# Patient Record
Sex: Male | Born: 1935 | Race: Black or African American | Hispanic: No | Smoking: Former smoker
Health system: Southern US, Community
[De-identification: ages and names within clinical notes are randomized; demographics above are authoritative.]

## PROBLEM LIST (undated history)

## (undated) DIAGNOSIS — I5020 Unspecified systolic (congestive) heart failure: Secondary | ICD-10-CM

## (undated) DIAGNOSIS — I480 Paroxysmal atrial fibrillation: Secondary | ICD-10-CM

## (undated) DIAGNOSIS — I10 Essential (primary) hypertension: Secondary | ICD-10-CM

## (undated) DIAGNOSIS — I214 Non-ST elevation (NSTEMI) myocardial infarction: Secondary | ICD-10-CM

## (undated) DIAGNOSIS — Z973 Presence of spectacles and contact lenses: Secondary | ICD-10-CM

## (undated) DIAGNOSIS — G4730 Sleep apnea, unspecified: Secondary | ICD-10-CM

## (undated) DIAGNOSIS — I429 Cardiomyopathy, unspecified: Secondary | ICD-10-CM

## (undated) DIAGNOSIS — I2510 Atherosclerotic heart disease of native coronary artery without angina pectoris: Secondary | ICD-10-CM

## (undated) DIAGNOSIS — M1990 Unspecified osteoarthritis, unspecified site: Secondary | ICD-10-CM

## (undated) DIAGNOSIS — K759 Inflammatory liver disease, unspecified: Secondary | ICD-10-CM

## (undated) DIAGNOSIS — I509 Heart failure, unspecified: Secondary | ICD-10-CM

## (undated) DIAGNOSIS — I255 Ischemic cardiomyopathy: Secondary | ICD-10-CM

## (undated) DIAGNOSIS — Z972 Presence of dental prosthetic device (complete) (partial): Secondary | ICD-10-CM

## (undated) DIAGNOSIS — I77810 Thoracic aortic ectasia: Secondary | ICD-10-CM

## (undated) DIAGNOSIS — C801 Malignant (primary) neoplasm, unspecified: Secondary | ICD-10-CM

## (undated) DIAGNOSIS — K254 Chronic or unspecified gastric ulcer with hemorrhage: Secondary | ICD-10-CM

## (undated) DIAGNOSIS — I639 Cerebral infarction, unspecified: Secondary | ICD-10-CM

## (undated) DIAGNOSIS — N182 Chronic kidney disease, stage 2 (mild): Secondary | ICD-10-CM

## (undated) HISTORY — PX: COLONOSCOPY: SHX174

## (undated) HISTORY — DX: Thoracic aortic ectasia: I77.810

## (undated) HISTORY — DX: Chronic kidney disease, stage 2 (mild): N18.2

## (undated) HISTORY — DX: Atherosclerotic heart disease of native coronary artery without angina pectoris: I25.10

---
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
## 1979-01-06 DIAGNOSIS — K759 Inflammatory liver disease, unspecified: Secondary | ICD-10-CM

## 1979-01-06 HISTORY — DX: Inflammatory liver disease, unspecified: K75.9

## 2000-01-06 DIAGNOSIS — C61 Malignant neoplasm of prostate: Secondary | ICD-10-CM

## 2000-01-06 HISTORY — PX: PROSTATECTOMY: SHX69

## 2000-01-06 HISTORY — DX: Malignant neoplasm of prostate: C61

## 2001-01-05 DIAGNOSIS — I639 Cerebral infarction, unspecified: Secondary | ICD-10-CM

## 2001-01-05 HISTORY — DX: Cerebral infarction, unspecified: I63.9

## 2001-12-05 HISTORY — PX: JOINT REPLACEMENT: SHX530

## 2003-01-06 DIAGNOSIS — G4730 Sleep apnea, unspecified: Secondary | ICD-10-CM

## 2003-01-06 HISTORY — DX: Sleep apnea, unspecified: G47.30

## 2007-01-06 HISTORY — PX: HERNIA REPAIR: SHX51

## 2007-02-05 ENCOUNTER — Encounter
Admission: RE | Admit: 2007-02-05 | Discharge: 2007-02-05 | Payer: Self-pay | Admitting: Physical Medicine and Rehabilitation

## 2007-02-15 ENCOUNTER — Encounter: Admission: RE | Admit: 2007-02-15 | Discharge: 2007-02-15 | Payer: Self-pay | Admitting: Internal Medicine

## 2007-02-28 ENCOUNTER — Encounter: Admission: RE | Admit: 2007-02-28 | Discharge: 2007-02-28 | Payer: Self-pay | Admitting: General Surgery

## 2007-03-03 ENCOUNTER — Ambulatory Visit (HOSPITAL_BASED_OUTPATIENT_CLINIC_OR_DEPARTMENT_OTHER): Admission: RE | Admit: 2007-03-03 | Discharge: 2007-03-03 | Payer: Self-pay | Admitting: General Surgery

## 2007-03-03 ENCOUNTER — Encounter (INDEPENDENT_AMBULATORY_CARE_PROVIDER_SITE_OTHER): Payer: Self-pay | Admitting: General Surgery

## 2010-05-20 NOTE — Op Note (Signed)
Brian Brown, Brian Brown              ACCOUNT NO.:  [id number]   MEDICAL RECORD NO.:  [id number]          PATIENT TYPE:  AMB   LOCATION:  [facility]                         FACILITY:  [facility]   PHYSICIAN:  Anselm Pancoast. Weatherly, M.D.DATE OF BIRTH:  02-01-1932   DATE OF PROCEDURE:  03/03/2007  DATE OF DISCHARGE:                               OPERATIVE REPORT   PREOPERATIVE DIAGNOSES:  Right inguinal hernia.   POSTOPERATIVE DIAGNOSES:  Right inguinal hernia, indirect.   OPERATIONS:  Right inguinal herniorrhaphy.   General anesthesia, LOA tube, with local supplementation.   [name] is a 77 year old thin male who was referred to me by Dr.  Nehemiah Settle with a symptomatic right inguinal hernia.  The patient has had a  previous open prostatectomy about 15 years ago, no evidence of any other  problem, and states that the hernia has been gradually increasing in  size and then recently he was cutting some fire wood and got a definite  bulge and pain in the right groin.  I thought this was probably a direct  inguinal hernia.  He had had problems with back problems and was  receiving physical therapy and injections for this under Dr. Haskell Riling  service and I recommended that he try to get his back better and then we  would proceed with the right inguinal hernia.  He has had no evidence of  any further problems with his prostate or difficulty voiding, and he is  now had no bowel irregularity.  Laboratory studies were unremarkable and  preoperatively he had a good bowel movement last evening.   The patient was taken to the operative suite.  He was given a gram of  Ancef and then positioned on the OR table, LOA tube by Dr. Shireen Quan, and  then the right groin area was first clipped and then prepped with  Betadine solution.  We had marked the incision, as had the patient, and  time-out was completed according to protocol and then draped in a  sterile manner.  The incision was made, sharp dissection down through  the skin, subcutaneous tissue and Scarpa fascia.  There were three veins  that were identified, clamped, divided and ligated with fine Vicryl.  The external oblique aponeurosis had been kind of attenuated at the  midportion, probably from his lower midline incision, and we were  dissecting, freeing up the area, and then opened the external oblique,  exposing the cord structures.  I was surprised to see that this is not a  direct hernia but an indirect hernia with a fairly large hernia sac, and  I elevated the cord structures.  I think I identified the ilioinguinal  nerve that was protected with the cord structure and then, working  medially, separated the hernia sac from the cord structures.  There was  a fairly medium-sized lipoma of the port that I first removed.  The  little pedicles were tied with 4-0 Vicryl and then the hernia sac that  was probably 4-5 inches in length, was opened and a high sac ligation  was done right at the ring area with 0 Surgilon  and a second suture was  placed just distal.  There was no bowel and the hernia sac at this time  and the area where the cord high sac ligation had been performed was  anesthetized also with the anesthetic, 0.5% Marcaine and 0.25  __________.  I had blocked the ilioinguinal nerve area with a blunted 20-  gauge needle before the incision.  Next I reinforced the floor, where it  was weakened but not really a true direct hernia, with a 2-0 Prolene  starting at the symphysis pubis, going back, recreating the internal  ring, and then going back tying the two edges together, suturing the  shelving edge of the inguinal ligament to the conjoined tendon area.  Next a piece of Prolene mesh shaped like a sail, slit laterally, was  used to reinforce the floor.  The inferior limb was sutured with a  running 2-0 Prolene.  The two tails go around the internal ring and the  ilioinguinal nerve was with these structures.  The two tails were  sutured  together laterally and it is lying flat and not under excessive  tension.  I then sutured the superior flap with interrupted sutures of 2-  0 Prolene and it is lying comfortable.  Next the external oblique was  sutured with a 3-0 Vicryl and then Scarpa fascia  was sutured with interrupted 3-0 Vicryl, 4-0 Dexon subcuticular and  Benzoin and Steri-Strips on the skin.  All total, we had used about 30  mL of the mixture of anesthetics.  He says he has not had any problems  with voiding since his prostatectomy.           ______________________________  Anselm Pancoast. Zachery Dakins, M.D.     WJW/MEDQ  D:  03/03/2007  T:  03/04/2007  Job:  [id number]   cc:   Deirdre Peer. Polite, M.D.

## 2010-09-26 LAB — DIFFERENTIAL
Basophils Absolute: 0
Basophils Relative: 1
Eosinophils Absolute: 0.3
Eosinophils Relative: 3
Lymphocytes Relative: 36
Lymphs Abs: 2.9
Monocytes Absolute: 0.6
Monocytes Relative: 8
Neutro Abs: 4.3
Neutrophils Relative %: 53

## 2010-09-26 LAB — COMPREHENSIVE METABOLIC PANEL
ALT: 14
AST: 17
Albumin: 3.5
Alkaline Phosphatase: 73
BUN: 12
CO2: 31
Calcium: 9.2
Chloride: 102
Creatinine, Ser: 1
GFR calc Af Amer: >60
GFR calc non Af Amer: >60
Glucose, Bld: 80
Potassium: 4.4
Sodium: 139
Total Bilirubin: 0.7
Total Protein: 6.6

## 2010-09-26 LAB — CBC
HCT: 40.9
Hemoglobin: 13.6
MCHC: 33.3
MCV: 86.6
Platelets: 255
RBC: 4.72
RDW: 14.6
WBC: 8.1

## 2011-01-07 DIAGNOSIS — M1610 Unilateral primary osteoarthritis, unspecified hip: Secondary | ICD-10-CM | POA: Diagnosis not present

## 2011-01-13 ENCOUNTER — Other Ambulatory Visit (HOSPITAL_COMMUNITY): Payer: Self-pay

## 2011-01-20 ENCOUNTER — Encounter (HOSPITAL_COMMUNITY): Payer: Self-pay | Admitting: Pharmacy Technician

## 2011-01-27 ENCOUNTER — Other Ambulatory Visit: Payer: Self-pay

## 2011-01-27 ENCOUNTER — Ambulatory Visit (HOSPITAL_COMMUNITY)
Admission: RE | Admit: 2011-01-27 | Discharge: 2011-01-27 | Disposition: A | Discharge status: Home / Self Care | Payer: Medicare Other | Source: Ambulatory Visit | Attending: Orthopedic Surgery | Admitting: Orthopedic Surgery

## 2011-01-27 ENCOUNTER — Encounter (HOSPITAL_COMMUNITY)
Admission: RE | Admit: 2011-01-27 | Discharge: 2011-01-27 | Disposition: A | Discharge status: Home / Self Care | Payer: Medicare Other | Source: Ambulatory Visit | Attending: Orthopaedic Surgery | Admitting: Orthopaedic Surgery

## 2011-01-27 ENCOUNTER — Encounter (HOSPITAL_COMMUNITY): Payer: Self-pay

## 2011-01-27 DIAGNOSIS — Z01812 Encounter for preprocedural laboratory examination: Secondary | ICD-10-CM | POA: Insufficient documentation

## 2011-01-27 DIAGNOSIS — Z01818 Encounter for other preprocedural examination: Secondary | ICD-10-CM | POA: Insufficient documentation

## 2011-01-27 DIAGNOSIS — G4730 Sleep apnea, unspecified: Secondary | ICD-10-CM | POA: Diagnosis present

## 2011-01-27 DIAGNOSIS — I10 Essential (primary) hypertension: Secondary | ICD-10-CM | POA: Diagnosis present

## 2011-01-27 DIAGNOSIS — I639 Cerebral infarction, unspecified: Secondary | ICD-10-CM | POA: Diagnosis not present

## 2011-01-27 DIAGNOSIS — M169 Osteoarthritis of hip, unspecified: Secondary | ICD-10-CM | POA: Diagnosis present

## 2011-01-27 DIAGNOSIS — C61 Malignant neoplasm of prostate: Secondary | ICD-10-CM | POA: Diagnosis present

## 2011-01-27 DIAGNOSIS — Z01811 Encounter for preprocedural respiratory examination: Secondary | ICD-10-CM | POA: Diagnosis not present

## 2011-01-27 HISTORY — DX: Sleep apnea, unspecified: G47.30

## 2011-01-27 HISTORY — DX: Chronic or unspecified gastric ulcer with hemorrhage: K25.4

## 2011-01-27 HISTORY — DX: Essential (primary) hypertension: I10

## 2011-01-27 HISTORY — DX: Cerebral infarction, unspecified: I63.9

## 2011-01-27 HISTORY — DX: Malignant (primary) neoplasm, unspecified: C80.1

## 2011-01-27 HISTORY — DX: Unspecified osteoarthritis, unspecified site: M19.90

## 2011-01-27 HISTORY — DX: Inflammatory liver disease, unspecified: K75.9

## 2011-01-27 LAB — URINE CULTURE
Colony Count: NO GROWTH
Culture  Setup Time: 201301221407
Culture: NO GROWTH

## 2011-01-27 LAB — DIFFERENTIAL
Basophils Absolute: 0.1 10*3/uL (ref 0.0–0.1)
Basophils Relative: 1 % (ref 0–1)
Eosinophils Absolute: 0.3 10*3/uL (ref 0.0–0.7)
Eosinophils Relative: 4 % (ref 0–5)
Lymphocytes Relative: 36 % (ref 12–46)
Lymphs Abs: 2.4 10*3/uL (ref 0.7–4.0)
Monocytes Absolute: 0.7 10*3/uL (ref 0.1–1.0)
Monocytes Relative: 10 % (ref 3–12)
Neutro Abs: 3.2 10*3/uL (ref 1.7–7.7)
Neutrophils Relative %: 49 % (ref 43–77)

## 2011-01-27 LAB — PROTIME-INR
INR: 1.02 (ref 0.00–1.49)
Prothrombin Time: 13.6 seconds (ref 11.6–15.2)

## 2011-01-27 LAB — COMPREHENSIVE METABOLIC PANEL
ALT: 16 U/L (ref 0–53)
AST: 19 U/L (ref 0–37)
Albumin: 3.6 g/dL (ref 3.5–5.2)
Alkaline Phosphatase: 90 U/L (ref 39–117)
BUN: 15 mg/dL (ref 6–23)
CO2: 32 mEq/L (ref 19–32)
Calcium: 9.5 mg/dL (ref 8.4–10.5)
Chloride: 99 mEq/L (ref 96–112)
Creatinine, Ser: 0.84 mg/dL (ref 0.50–1.35)
GFR calc Af Amer: >90 mL/min (ref >90)
GFR calc non Af Amer: 83 mL/min — ABNORMAL LOW (ref >90)
Glucose, Bld: 76 mg/dL (ref 70–99)
Potassium: 3.3 mEq/L — ABNORMAL LOW (ref 3.5–5.1)
Sodium: 138 mEq/L (ref 135–145)
Total Bilirubin: 0.4 mg/dL (ref 0.3–1.2)
Total Protein: 7.6 g/dL (ref 6.0–8.3)

## 2011-01-27 LAB — URINALYSIS, ROUTINE W REFLEX MICROSCOPIC
Bilirubin Urine: NEGATIVE
Glucose, UA: NEGATIVE mg/dL
Ketones, ur: NEGATIVE mg/dL
Leukocytes, UA: NEGATIVE
Nitrite: NEGATIVE
Protein, ur: NEGATIVE mg/dL
Specific Gravity, Urine: 1.019 (ref 1.005–1.030)
Urobilinogen, UA: 0.2 mg/dL (ref 0.0–1.0)
pH: 6 (ref 5.0–8.0)

## 2011-01-27 LAB — HEPATITIS C ANTIBODY: HCV Ab: REACTIVE — AB

## 2011-01-27 LAB — CBC
HCT: 45 % (ref 39.0–52.0)
Hemoglobin: 15.5 g/dL (ref 13.0–17.0)
MCH: 28.9 pg (ref 26.0–34.0)
MCHC: 34.4 g/dL (ref 30.0–36.0)
MCV: 84 fL (ref 78.0–100.0)
Platelets: 224 10*3/uL (ref 150–400)
RBC: 5.36 MIL/uL (ref 4.22–5.81)
RDW: 13 % (ref 11.5–15.5)
WBC: 6.5 10*3/uL (ref 4.0–10.5)

## 2011-01-27 LAB — TYPE AND SCREEN
ABO/RH(D): A POS
Antibody Screen: NEGATIVE

## 2011-01-27 LAB — SURGICAL PCR SCREEN
MRSA, PCR: NEGATIVE
Staphylococcus aureus: NEGATIVE

## 2011-01-27 LAB — URINE MICROSCOPIC-ADD ON

## 2011-01-27 LAB — APTT: aPTT: 24 seconds (ref 24–37)

## 2011-01-27 LAB — ABO/RH: ABO/RH(D): A POS

## 2011-01-27 LAB — HEPATITIS B SURFACE ANTIGEN: Hepatitis B Surface Ag: NEGATIVE

## 2011-01-27 NOTE — Pre-Procedure Instructions (Signed)
20 Brian Brown  01/27/2011   Your procedure is scheduled on:  Tues, Jan 29  Report to [facility] at 8:30 AM.  Call this number if you have problems the morning of surgery: [phone]   Remember:   Do not eat food:After Midnight.  May have clear liquids: up to 4 Hours before arrival.  Clear liquids include soda, tea, black coffee, apple or grape juice, broth.  Take these medicines the morning of surgery with A SIP OF WATER: *Diltiazem,Metoprolol, Hydrocodone**   Do not wear jewelry, make-up or nail polish.  Do not wear lotions, powders, or perfumes. You may wear deodorant.  Do not shave 48 hours prior to surgery.  Do not bring valuables to the hospital.  Contacts, dentures or bridgework may not be worn into surgery.  Leave suitcase in the car. After surgery it may be brought to your room.  For patients admitted to the hospital, checkout time is 11:00 AM the day of discharge.   Patients discharged the day of surgery will not be allowed to drive home.  Name and phone number of your driver: *n/a**  Special Instructions: Incentive Spirometry - Practice and bring it with you on the day of surgery. and CHG Shower Use Special Wash: 1/2 bottle night before surgery and 1/2 bottle morning of surgery.   Please read over the following fact sheets that you were given: Pain Booklet, Coughing and Deep Breathing, Blood Transfusion Information, Total Joint Packet, MRSA Information and Surgical Site Infection Prevention

## 2011-01-27 NOTE — H&P (Signed)
Brian Brown [id number] DOB: 03-13-1932 Sex: male  COMPLAINT:     Painful left hip.  HPI:     Brian Brown is a very pleasant 78 year old African-American male who is being seen in our office for evaluation of his left hip.  He has had at least a three month history of gradual onset of pain and discomfort whenever he tries to ambulate.  He states he does not have much pain when he is sitting, but certainly does have pain with active ambulation.  He states his pain is moderately severe and is more of an aching, throbbing pain with occasional stabbing pain.  It is getting worse.  It does wake him at nighttime.  Activities of daily living have become quite bothersome to him.  At this time, nothing is really helping his pain except for hydrocodone.    MEDICATIONS:     Hydrocodone 5/500 mg 1 q.12 h. p.r.n. pain, metoprolol 25 mg 2 tablets daily, diltiazem 240 mg once daily, Plavix 75 mg daily, and simvastatin 40 mg daily.  ALLERGIES:     None known.  PAST MEDICAL HISTORY:    Remarkable for CVA in 2003.  PAST SURGICAL HISTORY:     Prostatectomy via abdominal approach in 2002 for cancer, right total hip replacement in 2003, and right inguinal hernia in 2009.  HOSPITALIZATIONS:     Bell's palsy and hepatitis in 1981.    ROS:     Fourteen point review of systems is positive for glasses and upper dentures.  He also had CVA in 2003 and has been placed on Plavix for that reason.  He has had hypertension for 32 years and is on medications for that.  He states he does have hepatitis, but he is not quite sure what type it is.  He has had prostate cancer and has undergone prostatectomy.  He does occasionally urinate two to three times per night.  He has had from the stroke some right leg weakness.  He also states that he has sleep apnea.  He does not use CPAP, but it has been suggested.  All others denied.  FAMILY HISTORY:     Mother deceased age 7 from stroke with history of hypertension and high blood  pressure.  Father deceased age 28 from myocardial infarction with history of heart disease, hypertension, and strokes.    Continued  SOCIAL HISTORY:    The patient is a very pleasant 78 year old African-American male.  He is widowed.  He is retired from Programme researcher, broadcasting/film/video.  He quit smoking cigarettes about three years ago.  Prior to that, he was smoking 1 PPD for many years.  Total of 15 years in duration, though, when accumulated.  He does drink on special occasions only.  He does not use snuff or oral tobacco.  EXAM:     Examination today reveals a very pleasant 78 year old African-American male who is well developed, well nourished, thin, alert, and cooperative.  He is in moderate distress secondary to left hip pain.    VITAL SIGNS:Temperature 97.4 degrees.  Pulse 54.  Respiration rate 16.  Blood pressure 142/81.    Height 5 feet 9 inches.  Weight 195 pounds. HEENT:   Head normocephalic.  Eyes PERRLA.  EOMs intact.  Neck supple, no bruits are noted.   Chest:   Good expansion.  Lungs reveal good breath sounds bilaterally without rales or rhonchi. Heart:   Regular rate and rhythm; that of a bradycardia.  No discrete murmurs, rubs, or gallops appreciated.  Pulses are 1+ bilateral and symmetric in the lower extremities.  CNS: Oriented x 3.  Cranial nerves 2 through 12 grossly intact. Abdomen:   Scaphoid soft, non tender, no masses palpable.  Normal bowel sounds present.   Genitalia:    Not indicated for orthopaedic evaluation. Rectal: Not indicated for orthopaedic evaluation. Breast: Not indicated for orthopaedic evaluation. Musculoskeletal:  Today the left hip barely has any internal or external rotation greater than 10 degrees to 20 degrees.  He does have a positive log roll.  Abduction is to about 20 degrees to 30 degrees.  I can get him to about 90 degrees of flexion, but pain at the extremes.  X-RAYS:     X-rays on the [facility] system reveal bone-on-bone osteoarthritis of the left hip.  I have received  information from Dr. Nehemiah Settle who feels that he is cleared from a medical and cardiac standpoint.  He is on Plavix and I have contacted Dr. Idelle Crouch office personally.  He states that he may stop the Plavix five days prior to surgery and then restart afterwards.  Hopefully he will be able to use Xarelto for 30 days and then bring him back onto Plavix.  IMPRESSION:      1. End-stage osteoarthritis, left hip. 2. History of prostate cancer. 3. History of Bell's palsy.  Continued 4. History of hepatitis, type unknown. 5. Prostate cancer. 6. Sleep apnea. 7. Hypertension.  RECOMMENDATIONS:      1. At this time, our plan is to schedule and proceed with a left total hip arthroplasty.  The procedure risks and benefits have been fully explained to the patient in detail using models today.  I have answered all of his questions.  His plan is to go home.   Brian Drone Petrarca, PA-C  01/27/2011   1:44 PM

## 2011-01-28 LAB — HEPATITIS A ANTIBODY, TOTAL: Hep A Total Ab: NEGATIVE

## 2011-01-29 LAB — HEPATITIS B E ANTIBODY: Hep B E Ab: POSITIVE

## 2011-01-29 NOTE — Consult Note (Addendum)
Anesthesia:  Patient is a 78 year old male scheduled for a left THR on 02/03/11.  His history includes CVA, OSA, HTN, GIB, prostate CA, former smoker.  He reports that he was turned down to donate blood in the past due to a positive screening for "hepatitis".  He has a history of a blood transfusion.  EKG shows SB at 56 bpm.  CXR shows no acute cardiopulmonary process.  Labs noted.  Hepatitis screening labs were ordered by the surgeon that showed a negative Hep A Total Ab and Hepatitis B Surface Ag, but a reactive HCV Ab.  His Hep E Ab is also positive.  I've called these results to Dr. Hoy Register PA, Jacqualine Code.  He is going to contact the patient's PCP Dr. Nehemiah Settle regarding further recommendations.  I have already requested Dr. Idelle Crouch last office note, but that is still pending.  Patient's LFTs, PLT count, and PT/PTT were WNL.  I reviewed above with Anesthesiologist Dr. Gypsy Balsam.  Okay to proceed from an Anesthesia standpoint.

## 2011-01-29 NOTE — Progress Notes (Signed)
Hep. A and Hep. B lab results in epic. Chart left out for Violet Baldy to review.

## 2011-02-02 MED ORDER — SODIUM CHLORIDE 0.9 % IV SOLN: INTRAVENOUS | Status: DC

## 2011-02-02 MED ORDER — CEFAZOLIN SODIUM-DEXTROSE 2-3 GM-% IV SOLR
2.0000 g | INTRAVENOUS | Status: AC
  Administered 2011-02-03: 2 g via INTRAVENOUS
  Filled 2011-02-02 (×2): qty 50

## 2011-02-03 ENCOUNTER — Ambulatory Visit (HOSPITAL_COMMUNITY): Payer: Medicare Other | Admitting: Vascular Surgery

## 2011-02-03 ENCOUNTER — Encounter (HOSPITAL_COMMUNITY): Payer: Self-pay | Admitting: Vascular Surgery

## 2011-02-03 ENCOUNTER — Inpatient Hospital Stay (HOSPITAL_COMMUNITY): Payer: Medicare Other

## 2011-02-03 ENCOUNTER — Encounter (HOSPITAL_COMMUNITY)
Admission: RE | Disposition: A | Discharge status: Home Health | Payer: Self-pay | Source: Ambulatory Visit | Attending: Orthopaedic Surgery

## 2011-02-03 ENCOUNTER — Encounter (HOSPITAL_COMMUNITY): Payer: Self-pay | Admitting: *Deleted

## 2011-02-03 ENCOUNTER — Inpatient Hospital Stay (HOSPITAL_COMMUNITY)
Admission: RE | Admit: 2011-02-03 | Discharge: 2011-02-06 | DRG: 470 | Disposition: A | Discharge status: Home Health | Payer: Medicare Other | Source: Ambulatory Visit | Attending: Orthopaedic Surgery | Admitting: Orthopaedic Surgery

## 2011-02-03 DIAGNOSIS — C61 Malignant neoplasm of prostate: Secondary | ICD-10-CM | POA: Diagnosis present

## 2011-02-03 DIAGNOSIS — IMO0002 Reserved for concepts with insufficient information to code with codable children: Secondary | ICD-10-CM | POA: Diagnosis not present

## 2011-02-03 DIAGNOSIS — M169 Osteoarthritis of hip, unspecified: Secondary | ICD-10-CM

## 2011-02-03 DIAGNOSIS — G4730 Sleep apnea, unspecified: Secondary | ICD-10-CM | POA: Diagnosis present

## 2011-02-03 DIAGNOSIS — B182 Chronic viral hepatitis C: Secondary | ICD-10-CM | POA: Diagnosis present

## 2011-02-03 DIAGNOSIS — D62 Acute posthemorrhagic anemia: Secondary | ICD-10-CM | POA: Diagnosis not present

## 2011-02-03 DIAGNOSIS — Z96649 Presence of unspecified artificial hip joint: Secondary | ICD-10-CM

## 2011-02-03 DIAGNOSIS — M1610 Unilateral primary osteoarthritis, unspecified hip: Secondary | ICD-10-CM | POA: Diagnosis not present

## 2011-02-03 DIAGNOSIS — E876 Hypokalemia: Secondary | ICD-10-CM | POA: Diagnosis not present

## 2011-02-03 DIAGNOSIS — Z8546 Personal history of malignant neoplasm of prostate: Secondary | ICD-10-CM | POA: Diagnosis not present

## 2011-02-03 DIAGNOSIS — I639 Cerebral infarction, unspecified: Secondary | ICD-10-CM | POA: Diagnosis not present

## 2011-02-03 DIAGNOSIS — Z8673 Personal history of transient ischemic attack (TIA), and cerebral infarction without residual deficits: Secondary | ICD-10-CM | POA: Diagnosis not present

## 2011-02-03 DIAGNOSIS — I10 Essential (primary) hypertension: Secondary | ICD-10-CM | POA: Diagnosis present

## 2011-02-03 DIAGNOSIS — Z471 Aftercare following joint replacement surgery: Secondary | ICD-10-CM | POA: Diagnosis not present

## 2011-02-03 DIAGNOSIS — G4710 Hypersomnia, unspecified: Secondary | ICD-10-CM | POA: Diagnosis not present

## 2011-02-03 DIAGNOSIS — E871 Hypo-osmolality and hyponatremia: Secondary | ICD-10-CM | POA: Diagnosis not present

## 2011-02-03 DIAGNOSIS — Z7902 Long term (current) use of antithrombotics/antiplatelets: Secondary | ICD-10-CM | POA: Diagnosis not present

## 2011-02-03 DIAGNOSIS — N9989 Other postprocedural complications and disorders of genitourinary system: Secondary | ICD-10-CM | POA: Diagnosis not present

## 2011-02-03 DIAGNOSIS — Z87891 Personal history of nicotine dependence: Secondary | ICD-10-CM

## 2011-02-03 DIAGNOSIS — R339 Retention of urine, unspecified: Secondary | ICD-10-CM | POA: Diagnosis not present

## 2011-02-03 DIAGNOSIS — K759 Inflammatory liver disease, unspecified: Secondary | ICD-10-CM | POA: Diagnosis not present

## 2011-02-03 DIAGNOSIS — M25559 Pain in unspecified hip: Secondary | ICD-10-CM | POA: Diagnosis not present

## 2011-02-03 DIAGNOSIS — R768 Other specified abnormal immunological findings in serum: Secondary | ICD-10-CM | POA: Diagnosis present

## 2011-02-03 HISTORY — PX: TOTAL HIP ARTHROPLASTY: SHX124

## 2011-02-03 LAB — BASIC METABOLIC PANEL
BUN: 17 mg/dL (ref 6–23)
CO2: 29 mEq/L (ref 19–32)
Calcium: 9.2 mg/dL (ref 8.4–10.5)
Chloride: 103 mEq/L (ref 96–112)
Creatinine, Ser: 0.92 mg/dL (ref 0.50–1.35)
GFR calc Af Amer: >90 mL/min (ref >90)
GFR calc non Af Amer: 80 mL/min — ABNORMAL LOW (ref >90)
Glucose, Bld: 87 mg/dL (ref 70–99)
Potassium: 3.7 mEq/L (ref 3.5–5.1)
Sodium: 140 mEq/L (ref 135–145)

## 2011-02-03 LAB — PSA: PSA: <0.01 ng/mL — ABNORMAL LOW (ref <=4.00)

## 2011-02-03 SURGERY — ARTHROPLASTY, HIP, TOTAL,POSTERIOR APPROACH
Anesthesia: General | Site: Hip | Laterality: Left | Wound class: Clean

## 2011-02-03 MED ORDER — ONDANSETRON HCL 4 MG/2ML IJ SOLN
INTRAMUSCULAR | Status: DC | PRN
  Administered 2011-02-03: 4 mg via INTRAVENOUS

## 2011-02-03 MED ORDER — METOPROLOL TARTRATE 25 MG PO TABS
25.0000 mg | ORAL_TABLET | Freq: Two times a day (BID) | ORAL | Status: DC
  Administered 2011-02-03 – 2011-02-06 (×5): 25 mg via ORAL
  Filled 2011-02-03 (×7): qty 1

## 2011-02-03 MED ORDER — BUPIVACAINE-EPINEPHRINE 0.5% -1:200000 IJ SOLN
INTRAMUSCULAR | Status: DC | PRN
  Administered 2011-02-03: 20 mL

## 2011-02-03 MED ORDER — ACETAMINOPHEN 10 MG/ML IV SOLN
1000.0000 mg | Freq: Four times a day (QID) | INTRAVENOUS | Status: AC
  Administered 2011-02-03 – 2011-02-04 (×4): 1000 mg via INTRAVENOUS
  Filled 2011-02-03 (×5): qty 100

## 2011-02-03 MED ORDER — DIPHENHYDRAMINE HCL 12.5 MG/5ML PO ELIX
12.5000 mg | ORAL_SOLUTION | Freq: Four times a day (QID) | ORAL | Status: DC | PRN
  Filled 2011-02-03: qty 5

## 2011-02-03 MED ORDER — SODIUM CHLORIDE 0.9 % IV SOLN
INTRAVENOUS | Status: DC
  Administered 2011-02-04: via INTRAVENOUS

## 2011-02-03 MED ORDER — CHLORHEXIDINE GLUCONATE 4 % EX LIQD: 60.0000 mL | Freq: Once | CUTANEOUS | Status: DC

## 2011-02-03 MED ORDER — ACETAMINOPHEN 10 MG/ML IV SOLN
INTRAVENOUS | Status: DC | PRN
  Administered 2011-02-03: 1000 mg via INTRAVENOUS

## 2011-02-03 MED ORDER — SODIUM CHLORIDE 0.9 % IJ SOLN: 9.0000 mL | INTRAMUSCULAR | Status: DC | PRN

## 2011-02-03 MED ORDER — KETOROLAC TROMETHAMINE 15 MG/ML IJ SOLN
15.0000 mg | Freq: Four times a day (QID) | INTRAMUSCULAR | Status: AC
  Administered 2011-02-03 – 2011-02-04 (×2): 15 mg via INTRAVENOUS
  Filled 2011-02-03 (×2): qty 1

## 2011-02-03 MED ORDER — HYDROMORPHONE HCL PF 1 MG/ML IJ SOLN
0.2500 mg | INTRAMUSCULAR | Status: DC | PRN
  Administered 2011-02-03: 0.25 mg via INTRAVENOUS
  Administered 2011-02-03: 0.5 mg via INTRAVENOUS
  Administered 2011-02-03: 0.25 mg via INTRAVENOUS

## 2011-02-03 MED ORDER — METHOCARBAMOL 100 MG/ML IJ SOLN
500.0000 mg | Freq: Four times a day (QID) | INTRAVENOUS | Status: DC | PRN
  Filled 2011-02-03: qty 5

## 2011-02-03 MED ORDER — DOCUSATE SODIUM 100 MG PO CAPS
100.0000 mg | ORAL_CAPSULE | Freq: Two times a day (BID) | ORAL | Status: DC
  Administered 2011-02-03 – 2011-02-06 (×7): 100 mg via ORAL
  Filled 2011-02-03 (×8): qty 1

## 2011-02-03 MED ORDER — DILTIAZEM HCL ER 240 MG PO CP24
240.0000 mg | ORAL_CAPSULE | Freq: Every day | ORAL | Status: DC
  Administered 2011-02-04 – 2011-02-06 (×3): 240 mg via ORAL
  Filled 2011-02-03 (×3): qty 1

## 2011-02-03 MED ORDER — CEFAZOLIN SODIUM-DEXTROSE 2-3 GM-% IV SOLR
2.0000 g | Freq: Four times a day (QID) | INTRAVENOUS | Status: AC
  Administered 2011-02-03: 2 g via INTRAVENOUS
  Filled 2011-02-03 (×2): qty 50

## 2011-02-03 MED ORDER — HYDROMORPHONE 0.3 MG/ML IV SOLN
INTRAVENOUS | Status: DC
  Administered 2011-02-03: 13:00:00 via INTRAVENOUS

## 2011-02-03 MED ORDER — LACTATED RINGERS IV SOLN
INTRAVENOUS | Status: DC | PRN
  Administered 2011-02-03 (×2): via INTRAVENOUS

## 2011-02-03 MED ORDER — NEOSTIGMINE METHYLSULFATE 1 MG/ML IJ SOLN
INTRAMUSCULAR | Status: DC | PRN
  Administered 2011-02-03: 2 mg via INTRAVENOUS

## 2011-02-03 MED ORDER — CEFAZOLIN SODIUM-DEXTROSE 2-3 GM-% IV SOLR
2.0000 g | Freq: Four times a day (QID) | INTRAVENOUS | Status: DC
  Administered 2011-02-03: 2 g via INTRAVENOUS
  Filled 2011-02-03 (×3): qty 50

## 2011-02-03 MED ORDER — BISACODYL 10 MG RE SUPP
10.0000 mg | Freq: Every day | RECTAL | Status: DC | PRN
  Filled 2011-02-03: qty 1

## 2011-02-03 MED ORDER — ONDANSETRON HCL 4 MG/2ML IJ SOLN: 4.0000 mg | Freq: Four times a day (QID) | INTRAMUSCULAR | Status: DC | PRN

## 2011-02-03 MED ORDER — EPHEDRINE SULFATE 50 MG/ML IJ SOLN
INTRAMUSCULAR | Status: DC | PRN
  Administered 2011-02-03 (×3): 10 mg via INTRAVENOUS
  Administered 2011-02-03: 5 mg via INTRAVENOUS

## 2011-02-03 MED ORDER — GLYCOPYRROLATE 0.2 MG/ML IJ SOLN
INTRAMUSCULAR | Status: DC | PRN
  Administered 2011-02-03: .3 mg via INTRAVENOUS

## 2011-02-03 MED ORDER — FENTANYL CITRATE 0.05 MG/ML IJ SOLN
INTRAMUSCULAR | Status: DC | PRN
  Administered 2011-02-03 (×2): 50 ug via INTRAVENOUS
  Administered 2011-02-03: 25 ug via INTRAVENOUS
  Administered 2011-02-03: 200 ug via INTRAVENOUS
  Administered 2011-02-03: 25 ug via INTRAVENOUS

## 2011-02-03 MED ORDER — HYDROMORPHONE 0.3 MG/ML IV SOLN
INTRAVENOUS | Status: DC
  Filled 2011-02-03: qty 25

## 2011-02-03 MED ORDER — MENTHOL 3 MG MT LOZG: 1.0000 | LOZENGE | OROMUCOSAL | Status: DC | PRN

## 2011-02-03 MED ORDER — RIVAROXABAN 10 MG PO TABS
10.0000 mg | ORAL_TABLET | ORAL | Status: DC
  Filled 2011-02-03: qty 1

## 2011-02-03 MED ORDER — PHENOL 1.4 % MT LIQD
1.0000 | OROMUCOSAL | Status: DC | PRN
  Filled 2011-02-03: qty 177

## 2011-02-03 MED ORDER — PHENYLEPHRINE HCL 10 MG/ML IJ SOLN
INTRAMUSCULAR | Status: DC | PRN
  Administered 2011-02-03: 80 ug via INTRAVENOUS

## 2011-02-03 MED ORDER — DIPHENHYDRAMINE HCL 50 MG/ML IJ SOLN: 12.5000 mg | Freq: Four times a day (QID) | INTRAMUSCULAR | Status: DC | PRN

## 2011-02-03 MED ORDER — ACETAMINOPHEN 10 MG/ML IV SOLN
INTRAVENOUS | Status: AC
  Filled 2011-02-03: qty 100

## 2011-02-03 MED ORDER — ACETAMINOPHEN 10 MG/ML IV SOLN: 1000.0000 mg | Freq: Once | INTRAVENOUS | Status: DC

## 2011-02-03 MED ORDER — METOCLOPRAMIDE HCL 5 MG/ML IJ SOLN
5.0000 mg | Freq: Three times a day (TID) | INTRAMUSCULAR | Status: DC | PRN
  Filled 2011-02-03: qty 2

## 2011-02-03 MED ORDER — NALOXONE HCL 0.4 MG/ML IJ SOLN: 0.4000 mg | INTRAMUSCULAR | Status: DC | PRN

## 2011-02-03 MED ORDER — DILTIAZEM HCL ER 240 MG PO CP24
240.0000 mg | ORAL_CAPSULE | Freq: Every day | ORAL | Status: DC
  Filled 2011-02-03: qty 1

## 2011-02-03 MED ORDER — METHOCARBAMOL 500 MG PO TABS
500.0000 mg | ORAL_TABLET | Freq: Four times a day (QID) | ORAL | Status: DC | PRN
  Administered 2011-02-03 – 2011-02-06 (×7): 500 mg via ORAL
  Filled 2011-02-03 (×7): qty 1

## 2011-02-03 MED ORDER — VECURONIUM BROMIDE 10 MG IV SOLR
INTRAVENOUS | Status: DC | PRN
  Administered 2011-02-03: 6 mg via INTRAVENOUS

## 2011-02-03 MED ORDER — PROPOFOL 10 MG/ML IV EMUL
INTRAVENOUS | Status: DC | PRN
  Administered 2011-02-03: 200 mg via INTRAVENOUS

## 2011-02-03 MED ORDER — METOCLOPRAMIDE HCL 10 MG PO TABS: 5.0000 mg | ORAL_TABLET | Freq: Three times a day (TID) | ORAL | Status: DC | PRN

## 2011-02-03 MED ORDER — SODIUM CHLORIDE 0.9 % IR SOLN
Status: DC | PRN
  Administered 2011-02-03: 1000 mL

## 2011-02-03 MED ORDER — SIMVASTATIN 40 MG PO TABS
40.0000 mg | ORAL_TABLET | Freq: Every evening | ORAL | Status: DC
  Administered 2011-02-03: 40 mg via ORAL
  Filled 2011-02-03 (×2): qty 1

## 2011-02-03 MED ORDER — LIDOCAINE HCL (CARDIAC) 20 MG/ML IV SOLN
INTRAVENOUS | Status: DC | PRN
  Administered 2011-02-03: 50 mg via INTRAVENOUS

## 2011-02-03 MED ORDER — ONDANSETRON HCL 4 MG PO TABS: 4.0000 mg | ORAL_TABLET | Freq: Four times a day (QID) | ORAL | Status: DC | PRN

## 2011-02-03 MED ORDER — METOPROLOL TARTRATE 25 MG PO TABS
25.0000 mg | ORAL_TABLET | Freq: Two times a day (BID) | ORAL | Status: DC
  Filled 2011-02-03 (×2): qty 1

## 2011-02-03 MED ORDER — CHLORHEXIDINE GLUCONATE 4 % EX LIQD: 60.0000 mL | Freq: Every day | CUTANEOUS | Status: DC

## 2011-02-03 MED ORDER — OXYCODONE HCL 5 MG PO TABS
5.0000 mg | ORAL_TABLET | ORAL | Status: DC | PRN
Start: 2011-02-03 — End: 2011-02-06
  Administered 2011-02-03 – 2011-02-06 (×7): 10 mg via ORAL
  Filled 2011-02-03 (×7): qty 2

## 2011-02-03 MED ORDER — RIVAROXABAN 10 MG PO TABS
10.0000 mg | ORAL_TABLET | Freq: Every day | ORAL | Status: DC
  Administered 2011-02-04 – 2011-02-05 (×3): 10 mg via ORAL
  Filled 2011-02-03 (×4): qty 1

## 2011-02-03 MED ORDER — ONDANSETRON HCL 4 MG/2ML IJ SOLN: 4.0000 mg | Freq: Once | INTRAMUSCULAR | Status: DC | PRN

## 2011-02-03 SURGICAL SUPPLY — 54 items
BLADE SAW SAG 73X25 THK (BLADE) ×1
BLADE SAW SGTL 73X25 THK (BLADE) ×1 IMPLANT
BRUSH FEMORAL CANAL (MISCELLANEOUS) IMPLANT
CLOTH BEACON ORANGE TIMEOUT ST (SAFETY) ×2 IMPLANT
COVER BACK TABLE 24X17X13 BIG (DRAPES) IMPLANT
COVER SURGICAL LIGHT HANDLE (MISCELLANEOUS) ×2 IMPLANT
DRAPE INCISE IOBAN 66X45 STRL (DRAPES) IMPLANT
DRAPE ORTHO SPLIT 77X108 STRL (DRAPES) ×2
DRAPE SURG ORHT 6 SPLT 77X108 (DRAPES) ×2 IMPLANT
DRSG MEPILEX BORDER 4X12 (GAUZE/BANDAGES/DRESSINGS) ×2 IMPLANT
DURAPREP 26ML APPLICATOR (WOUND CARE) ×2 IMPLANT
ELECT BLADE 6.5 EXT (BLADE) IMPLANT
ELECT REM PT RETURN 9FT ADLT (ELECTROSURGICAL) ×2
ELECTRODE REM PT RTRN 9FT ADLT (ELECTROSURGICAL) ×1 IMPLANT
EVACUATOR 1/8 PVC DRAIN (DRAIN) IMPLANT
FACESHIELD LNG OPTICON STERILE (SAFETY) ×6 IMPLANT
GLOVE BIOGEL PI IND STRL 8 (GLOVE) ×2 IMPLANT
GLOVE BIOGEL PI IND STRL 8.5 (GLOVE) IMPLANT
GLOVE BIOGEL PI INDICATOR 8 (GLOVE) ×2
GLOVE BIOGEL PI INDICATOR 8.5 (GLOVE)
GLOVE ECLIPSE 8.0 STRL XLNG CF (GLOVE) ×4 IMPLANT
GLOVE SURG ORTHO 8.5 STRL (GLOVE) ×4 IMPLANT
GOWN PREVENTION PLUS XLARGE (GOWN DISPOSABLE) ×4 IMPLANT
GOWN STRL NON-REIN LRG LVL3 (GOWN DISPOSABLE) ×4 IMPLANT
HANDPIECE INTERPULSE COAX TIP (DISPOSABLE)
IMMOBILIZER KNEE 20 (SOFTGOODS)
IMMOBILIZER KNEE 20 THIGH 36 (SOFTGOODS) IMPLANT
IMMOBILIZER KNEE 22 UNIV (SOFTGOODS) IMPLANT
IMMOBILIZER KNEE 24 THIGH 36 (MISCELLANEOUS) IMPLANT
IMMOBILIZER KNEE 24 UNIV (MISCELLANEOUS)
KIT BASIN OR (CUSTOM PROCEDURE TRAY) ×2 IMPLANT
KIT ROOM TURNOVER OR (KITS) ×2 IMPLANT
MANIFOLD NEPTUNE II (INSTRUMENTS) ×2 IMPLANT
NEEDLE 22X1 1/2 (OR ONLY) (NEEDLE) ×2 IMPLANT
NS IRRIG 1000ML POUR BTL (IV SOLUTION) ×2 IMPLANT
PACK TOTAL JOINT (CUSTOM PROCEDURE TRAY) ×2 IMPLANT
PAD ARMBOARD 7.5X6 YLW CONV (MISCELLANEOUS) ×4 IMPLANT
PRESSURIZER FEMORAL UNIV (MISCELLANEOUS) IMPLANT
SET HNDPC FAN SPRY TIP SCT (DISPOSABLE) IMPLANT
STAPLER VISISTAT 35W (STAPLE) ×2 IMPLANT
SUCTION FRAZIER TIP 10 FR DISP (SUCTIONS) ×2 IMPLANT
SUT BONE WAX W31G (SUTURE) IMPLANT
SUT ETHIBOND NAB CT1 #1 30IN (SUTURE) ×6 IMPLANT
SUT MNCRL AB 3-0 PS2 18 (SUTURE) IMPLANT
SUT VIC AB 0 CT1 27 (SUTURE) ×3
SUT VIC AB 0 CT1 27XBRD ANBCTR (SUTURE) ×3 IMPLANT
SUT VIC AB 2-0 CT1 27 (SUTURE) ×1
SUT VIC AB 2-0 CT1 TAPERPNT 27 (SUTURE) ×1 IMPLANT
SYR CONTROL 10ML LL (SYRINGE) ×2 IMPLANT
TOWEL OR 17X24 6PK STRL BLUE (TOWEL DISPOSABLE) ×2 IMPLANT
TOWEL OR 17X26 10 PK STRL BLUE (TOWEL DISPOSABLE) ×2 IMPLANT
TOWER CARTRIDGE SMART MIX (DISPOSABLE) IMPLANT
TRAY FOLEY CATH 14FR (SET/KITS/TRAYS/PACK) ×2 IMPLANT
WATER STERILE IRR 1000ML POUR (IV SOLUTION) ×8 IMPLANT

## 2011-02-03 NOTE — Plan of Care (Signed)
Problem: Consults Goal: Diagnosis- Total Joint Replacement Primary Total Hip     

## 2011-02-03 NOTE — Transfer of Care (Signed)
Immediate Anesthesia Transfer of Care Note  Patient: Brian Brown  Procedure(s) Performed:  TOTAL HIP ARTHROPLASTY  Patient Location: PACU  Anesthesia Type: General  Level of Consciousness: awake, oriented and patient cooperative  Airway & Oxygen Therapy: Patient Spontanous Breathing and Patient connected to nasal cannula oxygen  Post-op Assessment: Report given to PACU RN, Post -op Vital signs reviewed and stable and Patient moving all extremities X 4  Post vital signs: Reviewed  Complications: No apparent anesthesia complications

## 2011-02-03 NOTE — Anesthesia Procedure Notes (Signed)
Procedure Name: Intubation Date/Time: 02/03/2011 10:38 AM Performed by: Carmela Rima Pre-anesthesia Checklist: Emergency Drugs available, Patient identified, Timeout performed, Suction available and Patient being monitored Patient Re-evaluated:Patient Re-evaluated prior to inductionOxygen Delivery Method: Circle System Utilized Preoxygenation: Pre-oxygenation with 100% oxygen Intubation Type: IV induction Ventilation: Mask ventilation without difficulty Laryngoscope Size: Mac and 4 Grade View: Grade II Tube type: Oral Tube size: 7.5 mm Number of attempts: 1 Placement Confirmation: ETT inserted through vocal cords under direct vision,  positive ETCO2,  CO2 detector and breath sounds checked- equal and bilateral Secured at: 23 cm Tube secured with: Tape Dental Injury: Teeth and Oropharynx as per pre-operative assessment

## 2011-02-03 NOTE — Anesthesia Postprocedure Evaluation (Signed)
  Anesthesia Post-op Note  Patient: Brian Brown  Procedure(s) Performed:  TOTAL HIP ARTHROPLASTY  Patient Location: PACU  Anesthesia Type: General  Level of Consciousness: awake, oriented, sedated and patient cooperative  Airway and Oxygen Therapy: Patient Spontanous Breathing and Patient connected to nasal cannula oxygen  Post-op Pain: mild  Post-op Assessment: Post-op Vital signs reviewed, Patient's Cardiovascular Status Stable, Respiratory Function Stable, Patent Airway, No signs of Nausea or vomiting and Pain level controlled  Post-op Vital Signs: stable  Complications: No apparent anesthesia complications

## 2011-02-03 NOTE — Op Note (Signed)
PATIENT ID:      Brian Brown  MRN:     [id number] DOB/AGE:    04-09-1932 / 78 y.o.       OPERATIVE REPORT    DATE OF PROCEDURE:  02/03/2011       PREOPERATIVE DIAGNOSIS:   osteoarthritis left hip                                                       There is no height or weight on file to calculate BMI.     POSTOPERATIVE DIAGNOSIS:   osteoarthritis left hip                                                                     There is no height or weight on file to calculate BMI.     PROCEDURE:  Procedure(s):LEFT TOTAL HIP ARTHROPLASTY     SURGEON: Herold Salguero W    ASSISTANT:   Jacqualine Code, PA-C   (Present and scrubbed throughout the case, critical for assistance with exposure, retraction, instrumentation, and closure.)          ANESTHESIA: Regional      DRAINS: none :      TOURNIQUET TIME: * No tourniquets in log *    COMPLICATIONS:  None   CONDITION:  stable  PROCEDURE IN DETAIL: dictated   [name] 02/03/2011, 12:30 PM

## 2011-02-03 NOTE — Op Note (Signed)
PATIENT ID:      Brian Brown  MRN:     [id number] DOB/AGE:    1932-05-27 / 78 y.o.       OPERATIVE REPORT   DATE OF PROCEDURE:  02/03/2011       PREOPERATIVE DIAGNOSIS:  left osteoarthritis left hip                                                       There is no height or weight on file to calculate BMI.     POSTOPERATIVE DIAGNOSIS:   left osteoarthritis left hip                                                                     There is no height or weight on file to calculate BMI.     PROCEDURE:  left Gardiner Ramus, MD  02/03/2011, 12:31 PM    ASSISTANT:   Jacqualine Code, PA-C   (Present and scrubbed throughout the case, critical for assistance with exposure, retraction, instrumentation, and closure.)          ANESTHESIA: Regional      DRAINS: none    COMPLICATIONS:  None        COMPONENTS:  DePuy AML 13.5 large stature  femoral component         A 36 -mm  outer diameter hip ball          A 1.39mm mm neck length                               A 56-mm outer  diameter sector II Porocoat acetabular shell with an apex hole eliminator          A pinnacle Marathon polyethylene liner  +4, 10-degree posterior lip.           Components were Press-Fit.      PROCEDURE IN DETAIL:  The patient was met in the holding area and personally  identified.  The appropriate hip was identified and marked at the operative site.The patient was then transported to room #1 and placed under  General anesthesia. A foley catheter was inserted by the nursing staff.Urine was clear  At that point, the patient was placed in the lateral decubitus position with the operative side up and secured to the operating room table with the Innomed hip system.      The operative lower extremity was prepped from the iliac crest to the distal leg with Betadine scrub and then DuraPrep.  Sterile draping was performed.      A routine southern incision was utilized and via sharp dissection carried down to the  subcutaneous tissue.  Gross bleeders were Bovie coagulated.  The iliotibial band was identified and incised along the length of the skin incision.  Self-retaining retractors were   inserted.  With the hip internally rotated, the short external rotators were identified. Tendinous structures were tagged with 0 Ethibond suture.  The hip capsule was identified and incised along the femoral neck and head.  There  was a small clear yellow joint effusion.  Hip was easily dislocated posteriorly.  The femoral neck was then osteotomized using a calcar guide and removed from the wound.  Synovectomy was  performed from the acetabulum.The femoral head was at least 50% devoid of articular cartilage.       The osteotomy was placed about 10 mm proximal to the lesser trochanter.  A starter hole was then made through the piriformis fossa.  Reaming was performed to the appropriate size i.e 13mm..  I had nice endosteal  purchase.  Rasping was performed sequentially to a 13.44mm large stature femoral component.      Retractors were then placed about the acetabulum.  Further synovectomy was performed.The sciatic nerve was identified and partially exposed between muscle tissue.I sutured muscle around the nerve to protect it from harm.  There was a large degenerative labrum that was also excised.  Retractor was placed about the acetabulum.  Reaming was performed sequentially to 59m.  Nice bleeding circumferentially and a nice strong thick acetabulum.  I then trialed the acetabular component.  It had complete seating.  Accordingly, acetabular component was impacted into the acetabulum.  It was a very nice fit and stable      The trial polyethylene liner was inserted followed by the femoral rasp.  We trialed a     number of neck lengths and felt like the 1.23mm neck and53mm ball was the most stable.  At that point, there was minimal toggling and complete stability.  Leg lengths were appropriate and egual from the pre op measurement..       The trial components were then removed.  The joint was copiously irrigated with saline solution.  Apex hole eliminator was inserted into the acetabular component followed by the final Marathon polyethylene liner.      The femoral component was then impacted onto the calcar.  Wound was again   irrigated.  We again trialed using the appropriate head and felt that it was perfectly stable.      The trial head was then removed.  We cleaned the [facility] taper neck and inserted the final head.  This was reduced, and through a full range of motion, it was perfectly  Stable  and there was no subluxation.  There was no evidence of instability.  It had a very nice construct.      Wound was then irrigated with saline solution.  The capsule was closed anatomically with #1 Ethibond.  The short external rotators were closed with similar material.  The wound was again irrigated with saline solution.  The iliotibial band was closed with  running #1 Vicryl and 0 ethibond. Subcu was closed with 2-0 Vicryl and 3-0 Monocryl, skin was closed with skin clips.  Sterile bulky dressing was applied followed by a knee immobilizer.  The patient was then placed in the supine position, awoken, placed on the operating  stretcher, and returned to the  postanesthesia recovery room in satisfactory condition.     Norlene Campbell, MD  02/03/2011, 12:31 PM  02/03/2011 12:31 PM

## 2011-02-03 NOTE — Anesthesia Preprocedure Evaluation (Addendum)
Anesthesia Evaluation  Patient identified by MRN, date of birth, ID band Patient awake    Reviewed: Allergy & Precautions, H&P , NPO status , Patient's Chart, lab work & pertinent test results  Airway Mallampati: I TM Distance: >3 FB Neck ROM: full    Dental  (+) Teeth Intact and Dental Advidsory Given   Pulmonary sleep apnea , former smoker         Cardiovascular Exercise Tolerance: Good hypertension, regular Normal    Neuro/Psych CVA, Residual Symptoms Negative Psych ROS   GI/Hepatic PUD, (+) Hepatitis -, C  Endo/Other  Negative Endocrine ROS  Renal/GU negative Renal ROS  Genitourinary negative   Musculoskeletal   Abdominal   Peds  Hematology   Anesthesia Other Findings   Reproductive/Obstetrics                          Anesthesia Physical Anesthesia Plan  ASA: III  Anesthesia Plan: General   Post-op Pain Management:    Induction: Intravenous  Airway Management Planned: Oral ETT  Additional Equipment:   Intra-op Plan:   Post-operative Plan: Extubation in OR  Informed Consent: I have reviewed the patients History and Physical, chart, labs and discussed the procedure including the risks, benefits and alternatives for the proposed anesthesia with the patient or authorized representative who has indicated his/her understanding and acceptance.   Dental Advisory Given  Plan Discussed with: CRNA, Anesthesiologist and Surgeon  Anesthesia Plan Comments:        Anesthesia Quick Evaluation

## 2011-02-03 NOTE — Progress Notes (Signed)
Orthopedic Tech Progress Note Patient Details:  Brian Brown 1932/08/26 [id number] Applied trapeze bar patient helper;viewed order from rn order list     [name] 02/03/2011, 9:23 PM

## 2011-02-03 NOTE — H&P (Signed)
There has been no change in health status since  the current H&P.I have examined the patient and discussed the surgery. No contraindications to the planned procedure exist.    There has been no change in health status since  the current H&P.I have examined the patient and discussed the surgery. No contraindications to the planned procedure exist.

## 2011-02-04 ENCOUNTER — Encounter (HOSPITAL_COMMUNITY): Payer: Self-pay | Admitting: Orthopaedic Surgery

## 2011-02-04 LAB — CBC
HCT: 33.9 % — ABNORMAL LOW (ref 39.0–52.0)
Hemoglobin: 11.2 g/dL — ABNORMAL LOW (ref 13.0–17.0)
MCH: 28.1 pg (ref 26.0–34.0)
MCHC: 33 g/dL (ref 30.0–36.0)
MCV: 85 fL (ref 78.0–100.0)
Platelets: 201 10*3/uL (ref 150–400)
RBC: 3.99 MIL/uL — ABNORMAL LOW (ref 4.22–5.81)
RDW: 13.3 % (ref 11.5–15.5)
WBC: 9.8 10*3/uL (ref 4.0–10.5)

## 2011-02-04 LAB — BASIC METABOLIC PANEL
BUN: 19 mg/dL (ref 6–23)
BUN: 18 mg/dL (ref 6–23)
CO2: 27 mEq/L (ref 19–32)
CO2: 27 mEq/L (ref 19–32)
Calcium: 8.5 mg/dL (ref 8.4–10.5)
Calcium: 8.5 mg/dL (ref 8.4–10.5)
Chloride: 101 mEq/L (ref 96–112)
Chloride: 100 mEq/L (ref 96–112)
Creatinine, Ser: 1.15 mg/dL (ref 0.50–1.35)
Creatinine, Ser: 1.04 mg/dL (ref 0.50–1.35)
GFR calc Af Amer: 69 mL/min — ABNORMAL LOW (ref >90)
GFR calc Af Amer: 78 mL/min — ABNORMAL LOW (ref >90)
GFR calc non Af Amer: 60 mL/min — ABNORMAL LOW (ref >90)
GFR calc non Af Amer: 68 mL/min — ABNORMAL LOW (ref >90)
Glucose, Bld: 115 mg/dL — ABNORMAL HIGH (ref 70–99)
Glucose, Bld: 108 mg/dL — ABNORMAL HIGH (ref 70–99)
Potassium: 3.5 mEq/L (ref 3.5–5.1)
Potassium: 3.3 mEq/L — ABNORMAL LOW (ref 3.5–5.1)
Sodium: 138 mEq/L (ref 135–145)
Sodium: 135 mEq/L (ref 135–145)

## 2011-02-04 MED ORDER — ACETAMINOPHEN 10 MG/ML IV SOLN
1000.0000 mg | Freq: Four times a day (QID) | INTRAVENOUS | Status: AC
  Administered 2011-02-04 – 2011-02-05 (×4): 1000 mg via INTRAVENOUS
  Filled 2011-02-04 (×4): qty 100

## 2011-02-04 MED ORDER — ROSUVASTATIN CALCIUM 10 MG PO TABS
10.0000 mg | ORAL_TABLET | Freq: Every day | ORAL | Status: DC
  Administered 2011-02-04 – 2011-02-05 (×2): 10 mg via ORAL
  Filled 2011-02-04 (×3): qty 1

## 2011-02-04 MED ORDER — SODIUM CHLORIDE 0.9 % IV SOLN
INTRAVENOUS | Status: DC
  Administered 2011-02-04: 400 mL via INTRAVENOUS

## 2011-02-04 MED ORDER — POTASSIUM CHLORIDE 10 MEQ PO TBCR
30.0000 meq | EXTENDED_RELEASE_TABLET | Freq: Once | ORAL | Status: AC
  Administered 2011-02-05: 30 meq via ORAL
  Filled 2011-02-04 (×2): qty 3

## 2011-02-04 MED ORDER — SODIUM CHLORIDE 0.9 % IV SOLN: INTRAVENOUS | Status: DC

## 2011-02-04 NOTE — Progress Notes (Signed)
Patient ID: Brian Brown, male   DOB: 06/26/1932, 78 y.o.   MRN: [id number] PATIENT ID:      Brian Brown  MRN:     [id number] DOB/AGE:    31-Mar-1932 / 78 y.o.    PROGRESS NOTE Subjective:  negative for Chest Pain  negative for Shortness of Breath  negative for Nausea/Vomiting   negative for Calf Pain  negative for Bowel Movement   Tolerating Diet: yes         Patient reports pain as moderate.    Objective: Vital signs in last 24 hours:  Patient Vitals for the past 24 hrs:  BP Temp Temp src Pulse Resp SpO2  02/04/11 0607 102/62 mmHg 99.3 F (37.4 C) - 80  19  97 %  02/03/11 2154 111/62 mmHg 98.2 F (36.8 C) - 75  19  100 %  02/03/11 1500 124/71 mmHg 97.4 F (36.3 C) - 59  16  100 %  02/03/11 1430 - 97.8 F (36.6 C) - - - -  02/03/11 1300 - 97.2 F (36.2 C) - - - -  02/03/11 0828 155/94 mmHg 97.6 F (36.4 C) Oral 57  18  99 %      Intake/Output from previous day:   01/29 0701 - 01/30 0700 In: 3890 [P.O.:240; I.V.:3500] Out: 1450 [Urine:1000]   Intake/Output this shift:       Intake/Output      01/29 0701 - 01/30 0700 01/30 0701 - 01/31 0700   P.O. 240    I.V. 3500    IV Piggyback 150    Total Intake 3890    Urine 1000    Blood 450    Total Output 1450    Net +2440            LABORATORY DATA:  Basename 02/04/11 0545  WBC 9.8  HGB 11.2*  HCT 33.9*  PLT 201    Basename 02/04/11 0545 02/03/11 0842  NA 138 140  K 3.5 3.7  CL 101 103  CO2 27 29  BUN 19 17  CREATININE 1.15 0.92  GLUCOSE 115* 87  CALCIUM 8.5 9.2   Lab Results  Component Value Date   INR 1.02 01/27/2011    Examination:  General appearance: alert and mild distress  Wound Exam: clean, dry, intact   Drainage:  None: wound tissue dry  Motor Exam EHL, FHL, Anterior Tibial and Posterior Tibial Intact  Sensory Exam Superficial Peroneal, Deep Peroneal and Tibial normal  Assessment:    1 Day Post-Op  Procedure(s) (LRB): TOTAL HIP ARTHROPLASTY (Left)  ADDITIONAL DIAGNOSIS:   Principal Problem:  *Osteoarthritis of hip Active Problems:  Hypertension  Sleep apnea  Stroke/cerebrovascular accident  Cancer of prostate  Acute Blood Loss Anemia   Plan: Physical Therapy as ordered Weight Bearing as Tolerated (WBAT)  DVT Prophylaxis:  Xarelto, Foot Pumps and TED hose  DISCHARGE PLAN: Home  DISCHARGE NEEDS: HHPT, Walker and 3-in-1 comode seat         Kymere Fullington W 02/04/2011, 8:14 AM

## 2011-02-04 NOTE — Progress Notes (Signed)
Pt was unable to spontaneously void.  Bladder scanner used.see flow sheet for documentation. Md on call notified. I&O  cath completed per Md. See flow sheet for output. Very small brown formation of what could have been a clot was noted in the urine after initial cath insertion. Pt verbalized he had trouble with urination after a surgery in 2003 and had to have a procedure to drain his bladder. Md was also made aware of this during phone report. Will continue to monitor pt and pass on the oncoming RN. Laure Kidney Culberson

## 2011-02-04 NOTE — Progress Notes (Signed)
Physical Therapy Treatment Patient Details Name: Brian Brown MRN: [id number] DOB: 01/05/1933 Today's Date: 02/04/2011  PT Assessment/Plan  PT - Assessment/Plan Comments on Treatment Session: Patient s/p L THA. Pt still requiring cueing with posterior precautions with mobility. Progressing well with ambulation and very motivated PT Plan: Discharge plan remains appropriate PT Frequency: 7X/week Follow Up Recommendations: Home health PT Equipment Recommended: None recommended by PT PT Goals  Acute Rehab PT Goals PT Goal Formulation: With patient Time For Goal Achievement: 7 days Pt will go Supine/Side to Sit: with modified independence PT Goal: Supine/Side to Sit - Progress: Goal set today Pt will go Sit to Supine/Side: with modified independence PT Goal: Sit to Supine/Side - Progress: Goal set today Pt will go Sit to Stand: with modified independence PT Goal: Sit to Stand - Progress: Progressing toward goal Pt will go Stand to Sit: with modified independence PT Goal: Stand to Sit - Progress: Progressing toward goal Pt will Ambulate: >150 feet;with modified independence;with least restrictive assistive device PT Goal: Ambulate - Progress: Progressing toward goal Pt will Go Up / Down Stairs: 1-2 stairs;with min assist;with least restrictive assistive device PT Goal: Up/Down Stairs - Progress: Goal set today Pt will Perform Home Exercise Program: Independently PT Goal: Perform Home Exercise Program - Progress: Progressing toward goal  PT Treatment Precautions/Restrictions  Precautions Precautions: Posterior Hip Precaution Booklet Issued: Yes (comment) (Posterior Hip Precaution sheet given on evaluation.) Precaution Comments: Pt able to verbalize 2 of 3 precautions at end of session. Still unable to verbalize no hip flexion >90 degrees and requires reinforment with all during activity. Required Braces or Orthoses: No Restrictions Weight Bearing Restrictions: Yes LLE Weight  Bearing: Weight bearing as tolerated Mobility (including Balance) Bed Mobility Bed Mobility: No Supine to Sit: 4: Min assist;With rails;HOB flat Supine to Sit Details (indicate cue type and reason): Assist for left LE due to pain with cues for sequence. Transfers Transfers: Yes Sit to Stand: 4: Min assist;From chair/3-in-1;With upper extremity assist;With armrests Sit to Stand Details (indicate cue type and reason): A to ensure balance and cues for positioning of LLE Stand to Sit: Other (comment);4: Min assist;To chair/3-in-1;With upper extremity assist;With armrests (MinGuard A) Stand to Sit Details: Cues to sit slowly and to slide out LLE Ambulation/Gait Ambulation/Gait: Yes Ambulation/Gait Assistance: 4: Min assist Ambulation/Gait Assistance Details (indicate cue type and reason): A for balance and cues for gait sequence/push RW. Pt moving from step to gait to step through. Pt slightly "rigid" with ambulation.  Ambulation Distance (Feet): 90 Feet Assistive device: Rolling walker Gait Pattern: Step-through pattern;Decreased stance time - left;Decreased step length - left Stairs: No Wheelchair Mobility Wheelchair Mobility: No  Posture/Postural Control Posture/Postural Control: No significant limitations Balance Balance Assessed: No Exercise  Total Joint Exercises Ankle Circles/Pumps: AROM;Both;10 reps;Seated Quad Sets: AROM;Left;10 reps;Seated Short Arc Quad: AROM;Left;10 reps;Supine Heel Slides: AAROM;Left;10 reps;Seated Hip ABduction/ADduction: AAROM;Left;10 reps;Seated Long Arc Quad: AAROM;Left;10 reps;Seated End of Session PT - End of Session Equipment Utilized During Treatment: Gait belt Activity Tolerance: Patient tolerated treatment well Patient left: in chair;with family/visitor present Nurse Communication: Mobility status for transfers;Mobility status for ambulation General Behavior During Session: [facility] for tasks performed Cognition: [facility] for tasks  performed  Fredrich Birks 02/04/2011, 11:31 AM 02/04/2011 Fredrich Birks PTA [phone] pager [phone] office

## 2011-02-04 NOTE — Progress Notes (Signed)
Referred to this CSW today for ?SNF. Chart reviewed and have spoken with RNCM and Care Coordinator who indicate patient plans to d/c home with HH and DME. CSW to sign off- please contact us if SW needs arise. Legacy [place]ivita, MSW, LCSWA [phone]   

## 2011-02-04 NOTE — Progress Notes (Addendum)
Occupational Therapy Evaluation Patient Details Name: Brian Brown MRN: [id number] DOB: [birth year] Today's Date: 02/04/2011  Problem List:  Patient Active Problem List  Diagnoses  . Osteoarthritis of hip  . Hypertension  . Sleep apnea  . Stroke/cerebrovascular accident  . Cancer of prostate    Past Medical History:  Past Medical History  Diagnosis Date  . Stroke 2003    w/ Rt sided weakness  . Sleep apnea 2005    does not wear a Cpap  . Hypertension     treated by Dr Nehemiah Settle, primary MD  . Bleeding stomach ulcer 25 yrs ago  . Hepatitis 1981    does not know what kind;denies jaundice  . Cancer 2002    prostate, s/p surgery  . Arthritis     degenerative joint disease   Past Surgical History:  Past Surgical History  Procedure Date  . Joint replacement 12/2001    rt hip ;[facility]  . Hernia repair 2009    rt inguinal hernia  . Prostatectomy 2002  . Total hip arthroplasty 02/03/2011    Procedure: TOTAL HIP ARTHROPLASTY;  Surgeon: Valeria Batman, MD;  Location: [facility] OR;  Service: Orthopedics;  Laterality: Left;    OT Assessment/Plan/Recommendation OT Assessment Clinical Impression Statement: Pt presents with medical diagnosis of L THA.  Pt with decreased I with functional transfers and ADLs.  Will benefit from acute OT to increase safety and I with functional transfers and ADLs in prep for d/c home at mod I level with daughters. OT Recommendation/Assessment: Patient will need skilled OT in the acute care venue OT Problem List: Decreased activity tolerance;Decreased knowledge of use of DME or AE;Decreased knowledge of precautions;Pain OT Therapy Diagnosis : Acute pain OT Plan OT Frequency: Min 2X/week OT Treatment/Interventions: Self-care/ADL training;DME and/or AE instruction;Therapeutic activities;Patient/family education OT Recommendation Follow Up Recommendations: Home health OT;Supervision/Assistance - 24 hour Equipment Recommended: Other (comment)  (Tub/Shower DME to be determined) Individuals Consulted Consulted and Agree with Results and Recommendations: Patient OT Goals Acute Rehab OT Goals OT Goal Formulation: With patient Time For Goal Achievement: 7 days ADL Goals Pt Will Perform Grooming: with modified independence;Standing at sink;Other (comment) (while maintaining posterior hip precautions) ADL Goal: Grooming - Progress: Goal set today Pt Will Perform Lower Body Dressing: with modified independence;Sit to stand from chair;Sit to stand from bed;with adaptive equipment (while maintaining posterior hip precautions) ADL Goal: Lower Body Dressing - Progress: Goal set today Pt Will Transfer to Toilet: with modified independence;Ambulation;with DME;3-in-1;Maintaining hip precautions ADL Goal: Toilet Transfer - Progress: Goal set today Pt will Perform Tub Transfer: with modified independence; Ambulation; with DME; with shower seat or tub bench; Maintaining hip precautions ADL Goal: Tub transfer- Progress: Goal set today Miscellaneous OT Goals Miscellaneous OT Goal #1: Pt will perform bed mobility with mod I while maintaining posterior hip precautions in prep for EOB ADLs. OT Goal: Miscellaneous Goal #1 - Progress: Goal set today Miscellaneous OT Goal #2: Pt will state and demonstrate 3/3 posterior hip precautions during all ADL activity. OT Goal: Miscellaneous Goal #2 - Progress: Goal set today  OT Evaluation Precautions/Restrictions  Precautions Precautions: Posterior Hip Precaution Booklet Issued: Yes (comment) (Posterior Hip Precaution sheet given on evaluation.) Precaution Comments: Pt able to name 2/3 hip precautions.   Required Braces or Orthoses: No Restrictions Weight Bearing Restrictions: Yes LLE Weight Bearing: Weight bearing as tolerated Prior Functioning Home Living Lives With: Alone Receives Help From: Family Type of Home: House Home Layout: One level Home Access: Stairs to enter  Entrance Stairs-Rails:  Right Entrance Stairs-Number of Steps: 2 Bathroom Shower/Tub: Tub/shower unit;[employer]: Standard Home Adaptive Equipment: Walker - rolling;Bedside commode/3-in-1 Prior Function Level of Independence: Independent with basic ADLs;Independent with homemaking with ambulation;Independent with gait;Independent with transfers Able to Take Stairs?: Yes Driving: Yes Vocation: Retired ADL ADL Lower Body Bathing: Simulated;Moderate assistance Lower Body Bathing Details (indicate cue type and reason): Mod assist due to posterior hip precautions Where Assessed - Lower Body Bathing: Sit to stand from chair Lower Body Dressing: Simulated;Moderate assistance Lower Body Dressing Details (indicate cue type and reason): Mod assist due to posterior hip precautions Where Assessed - Lower Body Dressing: Sit to stand from chair Toilet Transfer: Simulated;Minimal assistance Toilet Transfer Details (indicate cue type and reason): Min assist for steadying and to maintain posterior hip precautions. Toilet Transfer Method: Stand pivot Equipment Used: Rolling walker Vision/Perception    Cognition Cognition Arousal/Alertness: Awake/alert Overall Cognitive Status: Appears within functional limits for tasks assessed Orientation Level: Oriented X4 Sensation/Coordination Coordination Gross Motor Movements are Fluid and Coordinated: Yes Fine Motor Movements are Fluid and Coordinated: Yes WFL during eval. Pt reports difficulty with fine motor tasks such as buttoning clothing. Extremity Assessment RUE Assessment RUE Assessment: Within Functional Limits LUE Assessment LUE Assessment: Within Functional Limits Mobility  Bed Mobility Bed Mobility: No Transfers Transfers: Yes Sit to Stand: 4: Min assist;With upper extremity assist;With armrests;From chair/3-in-1 Sit to Stand Details (indicate cue type and reason): Min assist for steadying and balance. VC for hand placement and maintaining hip  precautions. Stand to Sit: 4: Min assist;To chair/3-in-1;With armrests Stand to Sit Details: Min assist for controlled descent.  VC for hand placement Exercises  End of Session OT - End of Session Activity Tolerance: Patient tolerated treatment well Patient left: in chair;with call bell in reach General Behavior During Session: [facility] for tasks performed Cognition: [facility] for tasks performed   Cipriano Mile 02/04/2011, 2:55 PM  02/04/2011 Cipriano Mile OTR/L Pager [phone] Office [phone]

## 2011-02-04 NOTE — Progress Notes (Signed)
Physical Therapy Evaluation Patient Details Name: Brian Brown MRN: [id number] DOB: 1932/06/04 Today's Date: 02/04/2011  Problem List:  Patient Active Problem List  Diagnoses  . Osteoarthritis of hip  . Hypertension  . Sleep apnea  . Stroke/cerebrovascular accident  . Cancer of prostate    Past Medical History:  Past Medical History  Diagnosis Date  . Stroke 2003    w/ Rt sided weakness  . Sleep apnea 2005    does not wear a Cpap  . Hypertension     treated by Dr Nehemiah Settle, primary MD  . Bleeding stomach ulcer 25 yrs ago  . Hepatitis 1981    does not know what kind;denies jaundice  . Cancer 2002    prostate, s/p surgery  . Arthritis     degenerative joint disease   Past Surgical History:  Past Surgical History  Procedure Date  . Joint replacement 12/2001    rt hip ;[facility]  . Hernia repair 2009    rt inguinal hernia  . Prostatectomy 2002    PT Assessment/Plan/Recommendation PT Assessment Clinical Impression Statement: Pt is a 78 y/o male admitted for left THA and the below PT problem list.  Pt would benefit from acute PT to maximize independence and facilitate d/c home with HHPT. PT Recommendation/Assessment: Patient will need skilled PT in the acute care venue PT Problem List: Decreased strength;Decreased activity tolerance;Decreased balance;Decreased knowledge of use of DME;Decreased knowledge of precautions;Pain Barriers to Discharge: None PT Therapy Diagnosis : Difficulty walking;Acute pain PT Plan PT Frequency: 7X/week PT Treatment/Interventions: DME instruction;Gait training;Stair training;Functional mobility training;Therapeutic activities;Balance training;Therapeutic exercise;Patient/family education PT Recommendation Follow Up Recommendations: Home health PT Equipment Recommended: None recommended by PT PT Goals  Acute Rehab PT Goals PT Goal Formulation: With patient Time For Goal Achievement: 7 days Pt will go Supine/Side to Sit: with  modified independence PT Goal: Supine/Side to Sit - Progress: Goal set today Pt will go Sit to Supine/Side: with modified independence PT Goal: Sit to Supine/Side - Progress: Goal set today Pt will go Sit to Stand: with modified independence PT Goal: Sit to Stand - Progress: Goal set today Pt will go Stand to Sit: with modified independence PT Goal: Stand to Sit - Progress: Goal set today Pt will Ambulate: >150 feet;with modified independence;with least restrictive assistive device PT Goal: Ambulate - Progress: Goal set today Pt will Go Up / Down Stairs: 1-2 stairs;with min assist;with least restrictive assistive device PT Goal: Up/Down Stairs - Progress: Goal set today Pt will Perform Home Exercise Program: Independently PT Goal: Perform Home Exercise Program - Progress: Goal set today  PT Evaluation Precautions/Restrictions  Precautions Precautions: Posterior Hip Precaution Booklet Issued: Yes (comment) (Posterior Hip Precaution sheet given on evaluation.) Required Braces or Orthoses: No Restrictions Weight Bearing Restrictions: Yes LLE Weight Bearing: Weight bearing as tolerated Prior Functioning  Home Living Lives With: Alone Receives Help From: Family (Daughters will come stay and help.) Type of Home: House Home Layout: One level Home Access: Stairs to enter Entrance Stairs-Rails: Right Entrance Stairs-Number of Steps: 2 Home Adaptive Equipment: Walker - rolling Prior Function Level of Independence: Independent with basic ADLs;Independent with homemaking with ambulation;Independent with gait;Independent with transfers Able to Take Stairs?: Yes Driving: Yes Cognition Cognition Arousal/Alertness: Awake/alert Overall Cognitive Status: Appears within functional limits for tasks assessed Orientation Level: Oriented X4 Sensation/Coordination Sensation Light Touch: Appears Intact Stereognosis: Not tested Hot/Cold: Not tested Proprioception: Not  tested Coordination Gross Motor Movements are Fluid and Coordinated: Yes Fine Motor Movements are Fluid  and Coordinated: Yes Extremity Assessment RUE Assessment RUE Assessment: Not tested LUE Assessment LUE Assessment: Not tested RLE Assessment RLE Assessment: Within Functional Limits LLE Assessment LLE Assessment: Exceptions to [facility] LLE Strength LLE Overall Strength: Deficits;Due to pain LLE Overall Strength Comments: 4/5 Pain 2/10 in left hip.  Pt repositioned. Mobility (including Balance) Bed Mobility Bed Mobility: Yes Supine to Sit: 4: Min assist;With rails;HOB flat Supine to Sit Details (indicate cue type and reason): Assist for left LE due to pain with cues for sequence. Transfers Transfers: Yes Sit to Stand: 4: Min assist;With upper extremity assist;From bed Sit to Stand Details (indicate cue type and reason): Assist to translate trunk over BOS with cues for hand placement. Stand to Sit: 4: Min assist;With upper extremity assist;To chair/3-in-1 Stand to Sit Details: Assist to slow descent and maintain posterior hip precautions with cues for hand/left LE placement. Ambulation/Gait Ambulation/Gait: Yes Ambulation/Gait Assistance: 4: Min assist Ambulation/Gait Assistance Details (indicate cue type and reason): Assist for balance to off weight left LE with cues for sequence inside RW. Ambulation Distance (Feet): 70 Feet Assistive device: Rolling walker Gait Pattern: Step-to pattern;Decreased step length - left;Decreased stance time - left Stairs: No Wheelchair Mobility Wheelchair Mobility: No  Posture/Postural Control Posture/Postural Control: No significant limitations Balance Balance Assessed: No Exercise  Total Joint Exercises Ankle Circles/Pumps: AROM;Left;10 reps;Supine Quad Sets: AROM;Left;10 reps;Supine Short Arc Quad: AROM;Left;10 reps;Supine Heel Slides: AAROM;Left;10 reps;Supine Hip ABduction/ADduction: AROM;Left;10 reps;Supine End of Session PT - End of  Session Equipment Utilized During Treatment: Gait belt Activity Tolerance: Patient tolerated treatment well Patient left: in chair;with call bell in reach Nurse Communication: Mobility status for transfers;Mobility status for ambulation General Behavior During Session: [facility] for tasks performed Cognition: [facility] for tasks performed  Cephus Shelling 02/04/2011, 10:47 AM  02/04/2011 Cephus Shelling, PT, DPT [phone]

## 2011-02-04 NOTE — Progress Notes (Signed)
UR COMPLETED  

## 2011-02-05 DIAGNOSIS — E876 Hypokalemia: Secondary | ICD-10-CM | POA: Diagnosis not present

## 2011-02-05 DIAGNOSIS — D62 Acute posthemorrhagic anemia: Secondary | ICD-10-CM | POA: Diagnosis not present

## 2011-02-05 LAB — BASIC METABOLIC PANEL
BUN: 16 mg/dL (ref 6–23)
CO2: 28 mEq/L (ref 19–32)
Calcium: 8.2 mg/dL — ABNORMAL LOW (ref 8.4–10.5)
Chloride: 100 mEq/L (ref 96–112)
Creatinine, Ser: 0.95 mg/dL (ref 0.50–1.35)
GFR calc Af Amer: >90 mL/min (ref >90)
GFR calc non Af Amer: 79 mL/min — ABNORMAL LOW (ref >90)
Glucose, Bld: 106 mg/dL — ABNORMAL HIGH (ref 70–99)
Potassium: 3.4 mEq/L — ABNORMAL LOW (ref 3.5–5.1)
Sodium: 134 mEq/L — ABNORMAL LOW (ref 135–145)

## 2011-02-05 LAB — CBC
HCT: 29.2 % — ABNORMAL LOW (ref 39.0–52.0)
Hemoglobin: 9.9 g/dL — ABNORMAL LOW (ref 13.0–17.0)
MCH: 28.4 pg (ref 26.0–34.0)
MCHC: 33.9 g/dL (ref 30.0–36.0)
MCV: 83.7 fL (ref 78.0–100.0)
Platelets: 160 10*3/uL (ref 150–400)
RBC: 3.49 MIL/uL — ABNORMAL LOW (ref 4.22–5.81)
RDW: 13.4 % (ref 11.5–15.5)
WBC: 10.2 10*3/uL (ref 4.0–10.5)

## 2011-02-05 MED ORDER — TAMSULOSIN HCL 0.4 MG PO CAPS
0.4000 mg | ORAL_CAPSULE | Freq: Every day | ORAL | Status: DC
  Administered 2011-02-05 – 2011-02-06 (×2): 0.4 mg via ORAL
  Filled 2011-02-05 (×2): qty 1

## 2011-02-05 MED ORDER — TAMSULOSIN HCL 0.4 MG PO CAPS: 0.4000 mg | ORAL_CAPSULE | Freq: Every day | ORAL | Status: DC

## 2011-02-05 MED ORDER — OXYCODONE HCL 5 MG PO TABS: 5.0000 mg | ORAL_TABLET | ORAL | Status: AC | PRN

## 2011-02-05 MED ORDER — METHOCARBAMOL 500 MG PO TABS: ORAL_TABLET | ORAL | Status: DC

## 2011-02-05 MED ORDER — POTASSIUM CHLORIDE CRYS ER 20 MEQ PO TBCR
40.0000 meq | EXTENDED_RELEASE_TABLET | Freq: Once | ORAL | Status: AC
  Administered 2011-02-05: 40 meq via ORAL
  Filled 2011-02-05: qty 2

## 2011-02-05 MED ORDER — RIVAROXABAN 10 MG PO TABS: 10.0000 mg | ORAL_TABLET | Freq: Every day | ORAL | Status: DC

## 2011-02-05 NOTE — Discharge Summary (Signed)
PATIENT ID:      TARUN PATCHELL  MRN:     [id number] DOB/AGE:    76-21-1934 / 76 y.o.     DISCHARGE SUMMARY  ADMISSION DATE:    02/03/2011 DISCHARGE DATE:   02/06/2011   ADMISSION DIAGNOSIS: end stage osteoarthritis left hip    DISCHARGE DIAGNOSIS: end stage osteoarthritis left hip    ADDITIONAL DIAGNOSIS: Principal Problem:  *Osteoarthritis of hip Active Problems:  Hypertension  Sleep apnea  Stroke/cerebrovascular accident  Cancer of prostate  Postoperative anemia due to acute blood loss  Hypokalemia  Hepatitis C reactive  Hyponatremia  Past Medical History  Diagnosis Date  . Stroke 2003    w/ Rt sided weakness  . Sleep apnea 2005    does not wear a Cpap  . Hypertension     treated by Dr Nehemiah Settle, primary MD  . Bleeding stomach ulcer 25 yrs ago  . Hepatitis 1981    does not know what kind;denies jaundice  . Cancer 2002    prostate, s/p surgery  . Arthritis     degenerative joint disease    PROCEDURE: Procedure(s): LEFT TOTAL HIP ARTHROPLASTY on 02/03/2011  CONSULTS:  none   HISTORY: Mr. Dollard is a very pleasant 78 year old African-American male who is being seen in our office for evaluation of his left hip. He has had at least a three month history of gradual onset of pain and discomfort whenever he tries to ambulate. He states he does not have much pain when he is sitting, but certainly does have pain with active ambulation. He states his pain is moderately severe and is more of an aching, throbbing pain with occasional stabbing pain. It is getting worse. It does wake him at nighttime. Activities of daily living have become quite bothersome to him. At this time, nothing is really helping his pain except for hydrocodone.    HOSPITAL COURSE:  SIMS LADAY is a 78 y.o. admitted on 02/03/2011 and found to have a diagnosis of osteoarthritis left hip.  After appropriate laboratory studies were obtained  they were taken to the operating room on 02/03/2011 and underwent  Procedure(s): TOTAL HIP ARTHROPLASTY.   They were given perioperative antibiotics:  Anti-infectives     Start     Dose/Rate Route Frequency Ordered Stop   02/03/11 1700   ceFAZolin (ANCEF) IVPB 2 g/50 mL premix        2 g 100 mL/hr over 30 Minutes Intravenous Every 6 hours 02/03/11 1501 02/04/11 0459   02/03/11 1500   ceFAZolin (ANCEF) IVPB 2 g/50 mL premix  Status:  Discontinued        2 g 100 mL/hr over 30 Minutes Intravenous Every 6 hours 02/03/11 1459 02/03/11 1501   02/02/11 1630   ceFAZolin (ANCEF) IVPB 2 g/50 mL premix        2 g 100 mL/hr over 30 Minutes Intravenous 60 min pre-op 02/02/11 1627 02/03/11 1046        . Blood products given:none  On POD#1, the foley was discontinued and the IV saline locked.  PCA was discontinued.  O2 was weaned off the patient.  PT was begun as per protocol. Given KCL for hypokalemia  On POD#2, the dressing was changed.  PT was continued. Foley was replaced secondary to urinary retention.  Placed on Flomax and foley d/c'd again.   Given another dose of KCL for hypokalemia  On POD#3 the dressing was changed again.  He had voided without difficulty.  Hypokalemia persisted and  another dose of KCL was given.  He had drank more fluids than usual to get himself to urinate.  He did have some mild hyponatremia from this.  The remainder of the hospital course was dedicated to ambulation and strengthening.   The patient was discharged on 3 Days Post-Op in  Stable condition.   DIAGNOSTIC STUDIES: Recent vital signs:  Patient Vitals for the past 24 hrs:  BP Temp Pulse Resp SpO2 Height Weight  02/06/11 0504 124/61 mmHg 99.6 F (37.6 C) 78  18  97 % - -  2011-02-11 2145 124/72 mmHg 98.6 F (37 C) 87  16  99 % - -  2011-02-11 1940 - - - - - 5\' 9"  (1.753 m) 90.42 kg (199 lb 5.4 oz)  February 11, 2011 1515 109/70 mmHg 99 F (37.2 C) 78  18  90 % - -       Recent laboratory studies:  Basename 02/06/11 0455 February 11, 2011 0620 02/04/11 0545  WBC 9.6 10.2 9.8  HGB  9.4* 9.9* 11.2*  HCT 27.9* 29.2* 33.9*  PLT 184 160 201    Basename 02/06/11 0455 02/11/2011 0620 02/04/11 1634 02/04/11 0545 02/03/11 0842  NA 132* 134* 135 138 140  K 3.2* 3.4* 3.3* 3.5 3.7  CL 99 100 100 101 103  CO2 25 28 27 27 29   BUN 13 16 18 19 17   CREATININE 0.91 0.95 1.04 1.15 0.92  GLUCOSE 121* 106* 108* 115* 87  CALCIUM 8.2* 8.2* 8.5 8.5 9.2   Lab Results  Component Value Date   INR 1.02 01/27/2011     Recent Radiographic Studies :   Chest 2 View  01/27/2011  *RADIOLOGY REPORT*  Clinical Data: Preop radiograph  CHEST - 2 VIEW  Comparison: 10/28/2007  Findings: The heart size and mediastinal contours are within normal limits.  Both lungs are clear.  The visualized skeletal structures are unremarkable.  IMPRESSION: Negative exam.  Original Report Authenticated By: Rosealee Albee, M.D.   Dg Pelvis Portable  02/03/2011  *RADIOLOGY REPORT*  Clinical Data: Postop left hip arthroplasty  PORTABLE PELVIS  Comparison: Cross-table lateral view the left hip 02/03/2011 and pelvis left hip radiographs 12/19/2010.  Findings: There are immediate postoperative changes of left total hip arthroplasty.  Expected locules of soft tissue gas and a line of skin staples are seen.  No hardware complication or periprosthetic fracture.  Multiple surgical clips project over the pelvis bilaterally.  The patient has a preexisting right total hip arthroplasty.  IMPRESSION: Immediate changes of the left total hip arthroplasty.  No complicating features.  Original Report Authenticated By: Britta Mccreedy, M.D.   Dg Hip Portable 1 View Left  02/03/2011  *RADIOLOGY REPORT*  Clinical Data: Postop left hip arthroplasty  PORTABLE LEFT HIP - 1 VIEW  Comparison: Postoperative pelvis radiograph 02/03/2011  Findings: Portable cross-table lateral view of the left hip is submitted.  The femoral head component of the prosthesis appears satisfactorily positioned within the acetabular component.  No evidence of subluxation or  dislocation.  Bony detail is diminished by portable technique and cross table positioning.  No evidence of periprosthetic fracture.  IMPRESSION: Left hip arthroplasty is located.  Original Report Authenticated By: Britta Mccreedy, M.D.    DISCHARGE INSTRUCTIONS: Discharge Orders    Future Orders Please Complete By Expires   Diet - low sodium heart healthy      Scheduling Instructions:   PLEASE EAT ON BANANA A DAY FOR THE NEXT WEEK   Call MD / Call 911  Comments:   If you experience chest pain or shortness of breath, CALL 911 and be transported to the hospital emergency room.  If you develope a fever above 101 F, pus (white drainage) or increased drainage or redness at the wound, or calf pain, call your surgeon's office.   Constipation Prevention      Comments:   Drink plenty of fluids.  Prune juice may be helpful.  You may use a stool softener, such as Colace (over the counter) 100 mg twice a day.  Use MiraLax (over the counter) for constipation as needed.   Increase activity slowly as tolerated      Weight Bearing as taught in Physical Therapy      Comments:   Use a walker or crutches as instructed in Physical Therapy (PT)   Patient may shower      Comments:   You may shower without a dressing once there is no drainage.  Do not wash over the wound.  If drainage remains, cover wound with plastic wrap and then shower.   Driving restrictions      Comments:   No driving for 6 weeks   Lifting restrictions      Comments:   No lifting for 6 weeks   Discharge instructions      Comments:   Diet: As you were doing prior to hospitalization   Activity:  Increase activity slowly as tolerated                  No lifting or driving for 6 weeks  Shower:  May shower without a dressing once there is no drainage from your wound.                 Do NOT wash over the wound.  If drainage remains, cover wound with saran                  Wrap and then shower.  Clean incision with betadine and change  dressing  after saran wrap removed.  Dressing:  You may change your dressing on Sunday                    Then change the dressing daily with sterile 4"x4"s gauze dressing                     And TED hose for knees.  Use paper tape to hold dressing in place                     For hips.  You may clean the incision with alcohol prior to redressing.  Weight Bearing:  Weight bearing as taught in physical therapy.  Use a walker or                    Crutches as instructed.  Follow- Up Appointment:  Please call for an appointment to be seen                [facility] - [phone]               [facility] - [phone]               [place] - [phone]     Follow the hip precautions as taught in Physical Therapy      Change dressing      Comments:   You may change your dressing on Sunday, then change the dressing daily with sterile  4 x 4 inch gauze dressing and paper tape.  You may clean the incision with alcohol prior to redressing   TED hose      Comments:   Use stockings (TED hose) for 3 weeks on left leg(s).  You may remove them at night for sleeping.      DISCHARGE MEDICATIONS:   Medication List  As of 02/06/2011  8:29 AM   STOP taking these medications         clopidogrel 75 MG tablet      HYDROcodone-acetaminophen 5-500 MG per tablet         TAKE these medications         diltiazem 240 MG 24 hr capsule   Commonly known as: DILACOR XR   Take 240 mg by mouth daily.      methocarbamol 500 MG tablet   Commonly known as: ROBAXIN   Take 1 tab q 8-12 hours prn spasms      metoprolol tartrate 25 MG tablet   Commonly known as: LOPRESSOR   Take 25 mg by mouth 2 (two) times daily.      oxyCODONE 5 MG immediate release tablet   Commonly known as: Oxy IR/ROXICODONE   Take 1-2 tablets (5-10 mg total) by mouth every 4 (four) hours as needed for pain.      rivaroxaban 10 MG Tabs tablet   Commonly known as: XARELTO   Take 1 tablet (10 mg total) by mouth daily with supper.        simvastatin 40 MG tablet   Commonly known as: ZOCOR   Take 40 mg by mouth every evening.            FOLLOW UP VISIT:   Follow-up Information    Follow up with [facility] on 02/18/2011.   Contact information:   [address] [place] [address] [phone]          DISPOSITION:  Home    CONDITION:  Stable   Mykeal Carrick 02/06/2011, 8:29 AM

## 2011-02-05 NOTE — Progress Notes (Signed)
Referred to this CSW today for ?SNF. Chart reviewed and have spoken with RNCM and Care Coordinator who indicate patient plans to d/c home with HH and DME. CSW to sign off- please contact us if SW needs arise. Pearline Yerby, MSW, LCSWA [phone]   

## 2011-02-05 NOTE — Progress Notes (Signed)
Physical Therapy Treatment Patient Details Name: ARGEL PABLO MRN: [id number] DOB: 02-Jan-1933 Today's Date: 02/05/2011  PT Assessment/Plan  PT - Assessment/Plan Comments on Treatment Session: Pt progressing with PT goals.  Did well with stair training this AM, needing Min (A) only for set-up & Min guard for actual stair management.   PT Plan: Discharge plan remains appropriate PT Frequency: 7X/week Follow Up Recommendations: Home health PT Equipment Recommended: None recommended by PT PT Goals  Acute Rehab PT Goals PT Goal: Sit to Stand - Progress: Progressing toward goal PT Goal: Stand to Sit - Progress: Progressing toward goal PT Goal: Ambulate - Progress: Progressing toward goal PT Goal: Up/Down Stairs - Progress: Met  PT Treatment Precautions/Restrictions  Precautions Precautions: Posterior Hip Precaution Booklet Issued: Yes (comment) (Posterior Hip Precaution sheet given on evaluation.) Precaution Comments: Pt verbalized 3/3 hip precautions Required Braces or Orthoses: No Restrictions Weight Bearing Restrictions: Yes LLE Weight Bearing: Weight bearing as tolerated Mobility (including Balance) Bed Mobility Bed Mobility: No Transfers Sit to Stand: Other (comment);From chair/3-in-1;With upper extremity assist;With armrests (Min Guard (A)) Sit to Stand Details (indicate cue type and reason): Cues for hand placement & ensure steadiness/balance before taking step.   Stand to Sit: Other (comment);To chair/3-in-1;With upper extremity assist;With armrests (Min Guard (A)) Stand to Sit Details: Cues for hand placement, use of UE's to control descent, & Lt. LE positioning.   Ambulation/Gait Ambulation/Gait Assistance Details (indicate cue type and reason): Cues for sequencing, stay inside RW, progression to step through pattern to promote gait sequencing fluency.   Ambulation Distance (Feet): 150 Feet Assistive device: Rolling walker Gait Pattern: Step-to pattern;Step-through  pattern;Antalgic;Decreased stance time - left;Decreased stride length;Decreased hip/knee flexion - left;Decreased hip/knee flexion - right Stairs: Yes Stairs Assistance: Other (comment);4: Min assist (Min (A) for set-up; Min Guard (A) for actual up/down stairs) Stairs Assistance Details (indicate cue type and reason): Verbal & visual demonstration for technique.   Stair Management Technique: One rail Left;Sideways Number of Stairs: 3  Wheelchair Mobility Wheelchair Mobility: No    Exercise    End of Session PT - End of Session Equipment Utilized During Treatment: Gait belt Activity Tolerance: Patient tolerated treatment well Patient left: in chair;with call bell in reach General Behavior During Session: [facility] for tasks performed Cognition: [facility] for tasks performed  Lara Mulch 02/05/2011, 10:23 AM [phone]

## 2011-02-05 NOTE — Progress Notes (Signed)
Physical Therapy Treatment Patient Details Name: SCOT SHIRAISHI MRN: [id number] DOB: 1932/07/21 Today's Date: 02/05/2011  PT Assessment/Plan  PT - Assessment/Plan Comments on Treatment Session: Pt very motivated & willing to participate.  Min Guard (A) level for sit<>stand transfers & ambulation.   PT Plan: Discharge plan remains appropriate PT Frequency: 7X/week Follow Up Recommendations: Home health PT Equipment Recommended: None recommended by PT PT Goals  Acute Rehab PT Goals PT Goal: Sit to Stand - Progress: Progressing toward goal PT Goal: Stand to Sit - Progress: Progressing toward goal PT Goal: Ambulate - Progress: Progressing toward goal PT Goal: Up/Down Stairs - Progress: Met PT Goal: Perform Home Exercise Program - Progress: Progressing toward goal  PT Treatment Precautions/Restrictions  Precautions Precautions: Posterior Hip Precaution Booklet Issued: Yes (comment) (Posterior Hip Precaution sheet given on evaluation.) Precaution Comments: Pt verbalized 3/3 hip precautions Required Braces or Orthoses: No Restrictions Weight Bearing Restrictions: Yes LLE Weight Bearing: Weight bearing as tolerated Mobility (including Balance) Bed Mobility Bed Mobility: No Transfers Sit to Stand: Other (comment);With upper extremity assist;With armrests;From chair/3-in-1 [employer] (A)) Sit to Stand Details (indicate cue type and reason): Cues to extend Lt Leg before standing up Stand to Sit: With upper extremity assist;With armrests;To chair/3-in-1 Stand to Sit Details: Cues to extend Lt leg before sitting.   Ambulation/Gait Ambulation/Gait Assistance: Other (comment) (Min Guard (A)) Ambulation/Gait Assistance Details (indicate cue type and reason): Cues for sequencing, to stay inside RW.  Pt with increased lateral lean/sway L<>R.   Ambulation Distance (Feet): 150 Feet Assistive device: Rolling walker Gait Pattern: Step-through pattern;Decreased stance time -  left;Antalgic;Lateral trunk lean to right;Lateral trunk lean to left Stairs: No Stairs Assistance: Other (comment);4: Min assist (Min (A) for set-up; Min Guard (A) for actual up/down stairs) Stairs Assistance Details (indicate cue type and reason): Verbal & visual demonstration for technique.   Stair Management Technique: One rail Left;Sideways Number of Stairs: 3  Wheelchair Mobility Wheelchair Mobility: No    Exercise  Total Joint Exercises Ankle Circles/Pumps: AROM;Both;10 reps;Seated Quad Sets: AROM;Both;10 reps;Seated;Strengthening Gluteal Sets: AROM;Strengthening;Both;10 reps;Seated Heel Slides: AAROM;Strengthening;Left;10 reps;Seated Hip ABduction/ADduction: AAROM;Strengthening;Left;10 reps;Seated Long Arc Quad: AROM;Strengthening;Left;10 reps;Seated Marching in Standing: Seated & standing w/ bil UE support;Left;10 reps;AROM;Strengthening End of Session PT - End of Session Equipment Utilized During Treatment: Gait belt Activity Tolerance: Patient tolerated treatment well Patient left: in chair General Behavior During Session: [facility] for tasks performed Cognition: [facility] for tasks performed  Lara Mulch 02/05/2011, 12:55 PM [phone]

## 2011-02-05 NOTE — Plan of Care (Signed)
Problem: Discharge Progression Outcomes Goal: Barriers To Progression Addressed/Resolved Outcome: Not Progressing Not voiding independently. Foley catheter reinserted  Comments:  md to consider starting on flomax in am. Bladder training needed.

## 2011-02-05 NOTE — Progress Notes (Signed)
Pt attempted to void several times. Unable to pass any urine. Dr. Cleophas Dunker notified. Foley cath placed. Stated he will decide later whether to start Flomax since pt had prostate surgery in past.

## 2011-02-05 NOTE — Progress Notes (Signed)
Patient ID: Brian Brown, male   DOB: January 25, 1932, 78 y.o.   MRN: [id number] PATIENT ID:      Brian Brown  MRN:     [id number] DOB/AGE:    [birth year] / 78 y.o.    PROGRESS NOTE Subjective:  negative for Chest Pain  negative for Shortness of Breath  negative for Nausea/Vomiting   negative for Calf Pain  negative for Bowel Movement   Tolerating Diet: yes         Patient reports pain as mild.    Objective: Vital signs in last 24 hours:  Patient Vitals for the past 24 hrs:  BP Temp Temp src Pulse Resp SpO2  02/05/11 0630 120/57 mmHg 98.8 F (37.1 C) - 67  16  96 %  02/04/11 2244 118/70 mmHg - - - - -  02/04/11 2232 106/55 mmHg 98.8 F (37.1 C) Oral 79  18  96 %      Intake/Output from previous day:   01/30 0701 - 01/31 0700 In: 1030 [P.O.:480; I.V.:350] Out: 1300 [Urine:1300]   Intake/Output this shift:   01/31 0701 - 01/31 1900 In: 480 [P.O.:480] Out: -    Intake/Output      01/30 0701 - 01/31 0700 01/31 0701 - 02/01 0700   P.O. 480 480   I.V. 350    IV Piggyback 200    Total Intake 1030 480   Urine 1300    Blood     Total Output 1300    Net -270 +480           LABORATORY DATA:  Basename 02/05/11 0620 02/04/11 0545  WBC 10.2 9.8  HGB 9.9* 11.2*  HCT 29.2* 33.9*  PLT 160 201    Basename 02/05/11 0620 02/04/11 1634 02/04/11 0545 02/03/11 0842  NA 134* 135 138 140  K 3.4* 3.3* 3.5 3.7  CL 100 100 101 103  CO2 28 27 27 29   BUN 16 18 19 17   CREATININE 0.95 1.04 1.15 0.92  GLUCOSE 106* 108* 115* 87  CALCIUM 8.2* 8.5 8.5 9.2   Lab Results  Component Value Date   INR 1.02 01/27/2011    Examination:  General appearance: alert, cooperative and no distress  Wound Exam: draining-minimal   Drainage:  Scant/small amount Serosanguinous exudate  Motor Exam EHL, FHL, Anterior Tibial and Posterior Tibial Intact  Sensory Exam Superficial Peroneal, Deep Peroneal and Tibial normal  Assessment:    2 Days Post-Op  Procedure(s) (LRB): TOTAL HIP  ARTHROPLASTY (Left)  ADDITIONAL DIAGNOSIS:  Principal Problem:  *Osteoarthritis of hip Active Problems:  Hypertension  Sleep apnea  Stroke/cerebrovascular accident  Cancer of prostate  Acute Blood Loss Anemia and Urinary Retention   Plan: Physical Therapy as ordered Weight Bearing as Tolerated (WBAT)  DVT Prophylaxis:  Xarelto, Foot Pumps and TED hose  DISCHARGE PLAN: Home  DISCHARGE NEEDS: HHPT, Walker and 3-in-1 comode seat     Will try flomax and D/C catheter, excellent effort in PT hope for D/C in am    WHITFIELD, PETER W 02/05/2011, 2:40 PM

## 2011-02-06 DIAGNOSIS — R768 Other specified abnormal immunological findings in serum: Secondary | ICD-10-CM | POA: Diagnosis present

## 2011-02-06 DIAGNOSIS — E871 Hypo-osmolality and hyponatremia: Secondary | ICD-10-CM | POA: Diagnosis not present

## 2011-02-06 LAB — BASIC METABOLIC PANEL
BUN: 13 mg/dL (ref 6–23)
CO2: 25 mEq/L (ref 19–32)
Calcium: 8.2 mg/dL — ABNORMAL LOW (ref 8.4–10.5)
Chloride: 99 mEq/L (ref 96–112)
Creatinine, Ser: 0.91 mg/dL (ref 0.50–1.35)
GFR calc Af Amer: >90 mL/min (ref >90)
GFR calc non Af Amer: 80 mL/min — ABNORMAL LOW (ref >90)
Glucose, Bld: 121 mg/dL — ABNORMAL HIGH (ref 70–99)
Potassium: 3.2 mEq/L — ABNORMAL LOW (ref 3.5–5.1)
Sodium: 132 mEq/L — ABNORMAL LOW (ref 135–145)

## 2011-02-06 LAB — CBC
HCT: 27.9 % — ABNORMAL LOW (ref 39.0–52.0)
Hemoglobin: 9.4 g/dL — ABNORMAL LOW (ref 13.0–17.0)
MCH: 28.2 pg (ref 26.0–34.0)
MCHC: 33.7 g/dL (ref 30.0–36.0)
MCV: 83.8 fL (ref 78.0–100.0)
Platelets: 184 10*3/uL (ref 150–400)
RBC: 3.33 MIL/uL — ABNORMAL LOW (ref 4.22–5.81)
RDW: 13.2 % (ref 11.5–15.5)
WBC: 9.6 10*3/uL (ref 4.0–10.5)

## 2011-02-06 MED ORDER — POTASSIUM CHLORIDE CRYS ER 20 MEQ PO TBCR
40.0000 meq | EXTENDED_RELEASE_TABLET | Freq: Once | ORAL | Status: AC
  Administered 2011-02-06: 40 meq via ORAL
  Filled 2011-02-06: qty 2

## 2011-02-06 MED ORDER — POTASSIUM CHLORIDE 20 MEQ PO PACK: 40.0000 meq | PACK | Freq: Once | ORAL | Status: DC

## 2011-02-06 NOTE — Progress Notes (Signed)
Patient ID: Brian Brown, male   DOB: October 26, 1932, 78 y.o.   MRN: [id number] PATIENT ID:      Brian Brown  MRN:     [id number] DOB/AGE:    [birth year] / 78 y.o.    PROGRESS NOTE Subjective:  negative for Chest Pain  negative for Shortness of Breath  negative for Nausea/Vomiting   negative for Calf Pain  negative for Bowel Movement   Tolerating Diet: yes         Patient reports pain as mild.    Objective: Vital signs in last 24 hours:  Patient Vitals for the past 24 hrs:  BP Temp Pulse Resp SpO2 Height Weight  02/06/11 0504 124/61 mmHg 99.6 F (37.6 C) 78  18  97 % - -  02/05/11 2145 124/72 mmHg 98.6 F (37 C) 87  16  99 % - -  02/05/11 1940 - - - - - 5\' 9"  (1.753 m) 90.42 kg (199 lb 5.4 oz)  02/05/11 1515 109/70 mmHg 99 F (37.2 C) 78  18  90 % - -      Intake/Output from previous day:   01/31 0701 - 02/01 0700 In: 1080 [P.O.:1080] Out: 1100 [Urine:1100]   Intake/Output this shift:       Intake/Output      01/31 0701 - 02/01 0700 02/01 0701 - 02/02 0700   P.O. 1080    I.V. (mL/kg)     IV Piggyback     Total Intake(mL/kg) 1080 (11.9)    Urine (mL/kg/hr) 1100 (0.5)    Total Output 1100    Net -20            LABORATORY DATA:  Basename 02/06/11 0455 02/05/11 0620 02/04/11 0545  WBC 9.6 10.2 9.8  HGB 9.4* 9.9* 11.2*  HCT 27.9* 29.2* 33.9*  PLT 184 160 201    Basename 02/06/11 0455 02/05/11 0620 02/04/11 1634 02/04/11 0545 02/03/11 0842  NA 132* 134* 135 138 140  K 3.2* 3.4* 3.3* 3.5 3.7  CL 99 100 100 101 103  CO2 25 28 27 27 29   BUN 13 16 18 19 17   CREATININE 0.91 0.95 1.04 1.15 0.92  GLUCOSE 121* 106* 108* 115* 87  CALCIUM 8.2* 8.2* 8.5 8.5 9.2   Lab Results  Component Value Date   INR 1.02 01/27/2011    Examination:  General appearance: alert, cooperative and mild distress Resp: clear to auscultation bilaterally Cardio: regular rate and rhythm GI: normal findings: bowel sounds normal Extremities: Homans sign is negative, no sign of  DVT  Wound Exam: draining   Drainage:  Scant/small amount Serosanguinous exudate  Motor Exam EHL, FHL, Anterior Tibial and Posterior Tibial Intact  Sensory Exam Superficial Peroneal, Deep Peroneal and Tibial normal  Assessment:    3 Days Post-Op  Procedure(s) (LRB): TOTAL HIP ARTHROPLASTY (Left)  ADDITIONAL DIAGNOSIS:  Principal Problem:  *Osteoarthritis of hip Active Problems:  Hypertension  Sleep apnea  Stroke/cerebrovascular accident  Cancer of prostate  Postoperative anemia due to acute blood loss  Hypokalemia  Hepatitis C reactive  Hyponatremia  Acute Blood Loss Anemia, Hypokalemia and Hyponatremia   Plan: Physical Therapy as ordered Weight Bearing as Tolerated (WBAT)  DVT Prophylaxis:  Xarelto, Foot Pumps and TED hose  DISCHARGE PLAN: Home TODAY  DISCHARGE NEEDS: HHPT, HHRN, Walker and 3-in-1 comode seat  WILL GIVE ANOTHER DOSE OF KCL PRIOR TO D/C ( )         Amante Fomby 02/06/2011, 8:21 AM

## 2011-02-06 NOTE — Progress Notes (Signed)
Occupational Therapy Treatment Patient Details Name: KAMAREON SCIANDRA MRN: [id number] DOB: 02-14-1932 Today's Date: 02/06/2011  OT Assessment/Plan OT Assessment/Plan Comments on Treatment Session: Pt. requires min verbal cues for THR precautions.  Pt. instructed on use and acquistion of AE for LB ADLs.  Pt. will sponge bathe until able to step into tub.  Has 3-in-1.  No further DME recommended.  Recommend HHOT OT Plan: Discharge plan remains appropriate Follow Up Recommendations: Home health OT;Supervision/Assistance - 24 hour Equipment Recommended: None recommended by OT OT Goals ADL Goals ADL Goal: Lower Body Dressing - Progress: Progressing toward goals ADL Goal: Tub/Shower Transfer - Progress: Discontinued (comment) Miscellaneous OT Goals OT Goal: Miscellaneous Goal #2 - Progress: Progressing toward goals  OT Treatment Precautions/Restrictions  Precautions Precautions: Posterior Hip Required Braces or Orthoses: No Restrictions Weight Bearing Restrictions: Yes LLE Weight Bearing: Weight bearing as tolerated   ADL ADL Lower Body Bathing: Simulated;Supervision/safety (using LH sponge) Where Assessed - Lower Body Bathing: Sit to stand from chair Lower Body Dressing: Performed;Minimal assistance Lower Body Dressing Details (indicate cue type and reason): using reacher, sock aid, LH shoe horn.  Pt. requires min verbal cues for THR precautions Where Assessed - Lower Body Dressing: Sit to stand from chair Tub/Shower Transfer:  (Pt. reports he will sponge bathe until able to step into tub) Equipment Used: Rolling walker;Reacher;Long-handled sponge;Long-handled shoe horn;Sock aid ADL Comments: Pt was instructed in use of AE for LB ADLs.  Requires min verbal cues for problem solving, use of AE, and for THR precautions.  He reports daughters will assist him at home as needed.  Recommend HHOT  Transfers Transfers: Yes Sit to Stand: 5: Supervision;From chair/3-in-1;With upper extremity  assist Stand to Sit: 5: Supervision;With upper extremity assist;To chair/3-in-1  End of Session OT - End of Session Activity Tolerance: Patient tolerated treatment well Patient left: in chair;with call bell in reach General Behavior During Session: [facility] for tasks performed Cognition: [facility] for tasks performed  Beyonce Sawatzky, Ursula Alert M  02/06/2011, 1:27 PM

## 2011-02-06 NOTE — Progress Notes (Signed)
Physical Therapy Treatment Patient Details Name: Brian Brown MRN: [id number] DOB: 1932/10/27 Today's Date: 02/06/2011  PT Assessment/Plan  PT - Assessment/Plan Comments on Treatment Session: Pt eager to d/c home today.  States his 2 daughters will be at home with him.  Mobilizing at an appropriate level to d/c home with daughters.   PT Plan: Discharge plan remains appropriate PT Frequency: 7X/week Follow Up Recommendations: Home health PT Equipment Recommended: None recommended by PT PT Goals  Acute Rehab PT Goals PT Goal: Supine/Side to Sit - Progress: Progressing toward goal PT Goal: Sit to Supine/Side - Progress: Progressing toward goal PT Goal: Sit to Stand - Progress: Not met PT Goal: Stand to Sit - Progress: Not met PT Goal: Ambulate - Progress: Progressing toward goal PT Goal: Perform Home Exercise Program - Progress: Progressing toward goal  PT Treatment Precautions/Restrictions  Precautions Precautions: Posterior Hip Precaution Booklet Issued: Yes (comment) (Posterior Hip Precaution sheet given on evaluation.) Precaution Comments: Pt verbalized 3/3 hip precautions Required Braces or Orthoses: No Restrictions Weight Bearing Restrictions: Yes LLE Weight Bearing: Weight bearing as tolerated Mobility (including Balance) Bed Mobility Supine to Sit: 4: Min assist;HOB flat Supine to Sit Details (indicate cue type and reason): Cues for technique & use of UE's to increase ease of transition.  (A) for Lt. leg.   Sit to Supine: 4: Min assist;HOB flat Sit to Supine - Details (indicate cue type and reason): Min (A) for Lt. leg & cues for technique, use of UE's.  Transfers Sit to Stand: 5: Supervision;From bed;With upper extremity assist;4: Min assist (ortho gym mat table. ) Sit to Stand Details (indicate cue type and reason): Min (A) for sit>stand from bed to stabilize RW- pt pulling up to standing with one hand on RW & other on bed.  Min Guard (A) from ortho gym mat table.  Pt  a little shakey.   Cues for hand placement & Lt. leg positioning Stand to Sit Details: Cues for Lt. leg positioning before sitting & to reach back for armrests/surface before sitting to assist with controlled descent.   Ambulation/Gait Ambulation/Gait Assistance: 5: Supervision Ambulation/Gait Assistance Details (indicate cue type and reason): Cues for sequencing, increased Lt. hip/knee flexion during swing phase, & to stay inside RW.  Pt continues to have lateral lean to Rt. with Lt. swing phase.  Ambulation Distance (Feet): 180 Feet Assistive device: Rolling walker Gait Pattern: Step-through pattern;Antalgic;Decreased stance time - left;Decreased stride length;Decreased step length - left;Decreased step length - right;Decreased hip/knee flexion - left;Lateral trunk lean to right Stairs: No Wheelchair Mobility Wheelchair Mobility: No    Exercise  Total Joint Exercises Ankle Circles/Pumps: AROM;Both;10 reps;Supine Quad Sets: AROM;Strengthening;Both;10 reps;Supine Gluteal Sets: AROM;Strengthening;Both;10 reps;Supine Heel Slides: Strengthening;AAROM;Left;10 reps;Supine Hip ABduction/ADduction: AAROM;Strengthening;Left;10 reps;Supine Bridges: Strengthening;AROM;10 reps;Supine;Both End of Session PT - End of Session Equipment Utilized During Treatment: Gait belt Activity Tolerance: Patient tolerated treatment well Patient left: in chair;with call bell in reach General Behavior During Session: [facility] for tasks performed Cognition: [facility] for tasks performed  Lara Mulch 02/06/2011, 10:59 AM [phone]

## 2011-02-06 NOTE — Progress Notes (Signed)
CARE MANAGEMENT NOTE 02/06/2011 Discharge planning.  Patient preoperatively setup with [facility]. no changes. Has DME, has family support.

## 2011-02-06 NOTE — Plan of Care (Signed)
Problem: Bladder/Voiding Goal: OTHER URINARY GOAL(S) Voiding or teach pt catheter care if d/c with catheter  Outcome: Completed/Met Date Met:  02/06/11 Pt voiding

## 2011-02-07 DIAGNOSIS — Z471 Aftercare following joint replacement surgery: Secondary | ICD-10-CM | POA: Diagnosis not present

## 2011-02-07 DIAGNOSIS — Z96649 Presence of unspecified artificial hip joint: Secondary | ICD-10-CM | POA: Diagnosis not present

## 2011-02-07 DIAGNOSIS — R269 Unspecified abnormalities of gait and mobility: Secondary | ICD-10-CM | POA: Diagnosis not present

## 2011-02-07 DIAGNOSIS — M6281 Muscle weakness (generalized): Secondary | ICD-10-CM | POA: Diagnosis not present

## 2011-02-07 DIAGNOSIS — I10 Essential (primary) hypertension: Secondary | ICD-10-CM | POA: Diagnosis not present

## 2011-02-09 LAB — HEPATITIS C VRS RNA DETECT BY PCR-QUAL: Hepatitis C Vrs RNA by PCR-Qual: NEGATIVE

## 2011-02-10 DIAGNOSIS — Z96649 Presence of unspecified artificial hip joint: Secondary | ICD-10-CM | POA: Diagnosis not present

## 2011-02-10 DIAGNOSIS — R269 Unspecified abnormalities of gait and mobility: Secondary | ICD-10-CM | POA: Diagnosis not present

## 2011-02-10 DIAGNOSIS — I10 Essential (primary) hypertension: Secondary | ICD-10-CM | POA: Diagnosis not present

## 2011-02-10 DIAGNOSIS — Z471 Aftercare following joint replacement surgery: Secondary | ICD-10-CM | POA: Diagnosis not present

## 2011-02-10 DIAGNOSIS — M6281 Muscle weakness (generalized): Secondary | ICD-10-CM | POA: Diagnosis not present

## 2011-02-12 DIAGNOSIS — Z471 Aftercare following joint replacement surgery: Secondary | ICD-10-CM | POA: Diagnosis not present

## 2011-02-12 DIAGNOSIS — M6281 Muscle weakness (generalized): Secondary | ICD-10-CM | POA: Diagnosis not present

## 2011-02-12 DIAGNOSIS — Z96649 Presence of unspecified artificial hip joint: Secondary | ICD-10-CM | POA: Diagnosis not present

## 2011-02-12 DIAGNOSIS — I10 Essential (primary) hypertension: Secondary | ICD-10-CM | POA: Diagnosis not present

## 2011-02-12 DIAGNOSIS — R269 Unspecified abnormalities of gait and mobility: Secondary | ICD-10-CM | POA: Diagnosis not present

## 2011-02-13 DIAGNOSIS — Z471 Aftercare following joint replacement surgery: Secondary | ICD-10-CM | POA: Diagnosis not present

## 2011-02-13 DIAGNOSIS — R269 Unspecified abnormalities of gait and mobility: Secondary | ICD-10-CM | POA: Diagnosis not present

## 2011-02-13 DIAGNOSIS — I10 Essential (primary) hypertension: Secondary | ICD-10-CM | POA: Diagnosis not present

## 2011-02-13 DIAGNOSIS — M6281 Muscle weakness (generalized): Secondary | ICD-10-CM | POA: Diagnosis not present

## 2011-02-13 DIAGNOSIS — Z96649 Presence of unspecified artificial hip joint: Secondary | ICD-10-CM | POA: Diagnosis not present

## 2011-02-14 DIAGNOSIS — R269 Unspecified abnormalities of gait and mobility: Secondary | ICD-10-CM | POA: Diagnosis not present

## 2011-02-14 DIAGNOSIS — Z471 Aftercare following joint replacement surgery: Secondary | ICD-10-CM | POA: Diagnosis not present

## 2011-02-14 DIAGNOSIS — M6281 Muscle weakness (generalized): Secondary | ICD-10-CM | POA: Diagnosis not present

## 2011-02-14 DIAGNOSIS — Z96649 Presence of unspecified artificial hip joint: Secondary | ICD-10-CM | POA: Diagnosis not present

## 2011-02-14 DIAGNOSIS — I10 Essential (primary) hypertension: Secondary | ICD-10-CM | POA: Diagnosis not present

## 2011-02-16 DIAGNOSIS — R269 Unspecified abnormalities of gait and mobility: Secondary | ICD-10-CM | POA: Diagnosis not present

## 2011-02-16 DIAGNOSIS — Z471 Aftercare following joint replacement surgery: Secondary | ICD-10-CM | POA: Diagnosis not present

## 2011-02-16 DIAGNOSIS — Z96649 Presence of unspecified artificial hip joint: Secondary | ICD-10-CM | POA: Diagnosis not present

## 2011-02-16 DIAGNOSIS — M6281 Muscle weakness (generalized): Secondary | ICD-10-CM | POA: Diagnosis not present

## 2011-02-16 DIAGNOSIS — I10 Essential (primary) hypertension: Secondary | ICD-10-CM | POA: Diagnosis not present

## 2011-02-17 DIAGNOSIS — M6281 Muscle weakness (generalized): Secondary | ICD-10-CM | POA: Diagnosis not present

## 2011-02-17 DIAGNOSIS — Z471 Aftercare following joint replacement surgery: Secondary | ICD-10-CM | POA: Diagnosis not present

## 2011-02-17 DIAGNOSIS — Z96649 Presence of unspecified artificial hip joint: Secondary | ICD-10-CM | POA: Diagnosis not present

## 2011-02-17 DIAGNOSIS — R269 Unspecified abnormalities of gait and mobility: Secondary | ICD-10-CM | POA: Diagnosis not present

## 2011-02-17 DIAGNOSIS — I10 Essential (primary) hypertension: Secondary | ICD-10-CM | POA: Diagnosis not present

## 2011-02-18 DIAGNOSIS — R269 Unspecified abnormalities of gait and mobility: Secondary | ICD-10-CM | POA: Diagnosis not present

## 2011-02-18 DIAGNOSIS — M6281 Muscle weakness (generalized): Secondary | ICD-10-CM | POA: Diagnosis not present

## 2011-02-18 DIAGNOSIS — M1610 Unilateral primary osteoarthritis, unspecified hip: Secondary | ICD-10-CM | POA: Diagnosis not present

## 2011-02-18 DIAGNOSIS — Z471 Aftercare following joint replacement surgery: Secondary | ICD-10-CM | POA: Diagnosis not present

## 2011-02-18 DIAGNOSIS — I10 Essential (primary) hypertension: Secondary | ICD-10-CM | POA: Diagnosis not present

## 2011-02-18 DIAGNOSIS — Z96649 Presence of unspecified artificial hip joint: Secondary | ICD-10-CM | POA: Diagnosis not present

## 2011-02-19 DIAGNOSIS — Z471 Aftercare following joint replacement surgery: Secondary | ICD-10-CM | POA: Diagnosis not present

## 2011-02-19 DIAGNOSIS — Z96649 Presence of unspecified artificial hip joint: Secondary | ICD-10-CM | POA: Diagnosis not present

## 2011-02-19 DIAGNOSIS — I10 Essential (primary) hypertension: Secondary | ICD-10-CM | POA: Diagnosis not present

## 2011-02-19 DIAGNOSIS — M6281 Muscle weakness (generalized): Secondary | ICD-10-CM | POA: Diagnosis not present

## 2011-02-19 DIAGNOSIS — R269 Unspecified abnormalities of gait and mobility: Secondary | ICD-10-CM | POA: Diagnosis not present

## 2011-02-21 DIAGNOSIS — R269 Unspecified abnormalities of gait and mobility: Secondary | ICD-10-CM | POA: Diagnosis not present

## 2011-02-21 DIAGNOSIS — Z471 Aftercare following joint replacement surgery: Secondary | ICD-10-CM | POA: Diagnosis not present

## 2011-02-21 DIAGNOSIS — M6281 Muscle weakness (generalized): Secondary | ICD-10-CM | POA: Diagnosis not present

## 2011-02-21 DIAGNOSIS — Z96649 Presence of unspecified artificial hip joint: Secondary | ICD-10-CM | POA: Diagnosis not present

## 2011-02-21 DIAGNOSIS — I10 Essential (primary) hypertension: Secondary | ICD-10-CM | POA: Diagnosis not present

## 2011-02-23 DIAGNOSIS — Z96649 Presence of unspecified artificial hip joint: Secondary | ICD-10-CM | POA: Diagnosis not present

## 2011-02-23 DIAGNOSIS — I10 Essential (primary) hypertension: Secondary | ICD-10-CM | POA: Diagnosis not present

## 2011-02-23 DIAGNOSIS — R269 Unspecified abnormalities of gait and mobility: Secondary | ICD-10-CM | POA: Diagnosis not present

## 2011-02-23 DIAGNOSIS — M6281 Muscle weakness (generalized): Secondary | ICD-10-CM | POA: Diagnosis not present

## 2011-02-23 DIAGNOSIS — Z471 Aftercare following joint replacement surgery: Secondary | ICD-10-CM | POA: Diagnosis not present

## 2011-02-25 DIAGNOSIS — R269 Unspecified abnormalities of gait and mobility: Secondary | ICD-10-CM | POA: Diagnosis not present

## 2011-02-25 DIAGNOSIS — M6281 Muscle weakness (generalized): Secondary | ICD-10-CM | POA: Diagnosis not present

## 2011-02-25 DIAGNOSIS — I10 Essential (primary) hypertension: Secondary | ICD-10-CM | POA: Diagnosis not present

## 2011-02-25 DIAGNOSIS — Z471 Aftercare following joint replacement surgery: Secondary | ICD-10-CM | POA: Diagnosis not present

## 2011-02-25 DIAGNOSIS — Z96649 Presence of unspecified artificial hip joint: Secondary | ICD-10-CM | POA: Diagnosis not present

## 2011-02-26 DIAGNOSIS — Z96649 Presence of unspecified artificial hip joint: Secondary | ICD-10-CM | POA: Diagnosis not present

## 2011-02-26 DIAGNOSIS — R269 Unspecified abnormalities of gait and mobility: Secondary | ICD-10-CM | POA: Diagnosis not present

## 2011-02-26 DIAGNOSIS — I10 Essential (primary) hypertension: Secondary | ICD-10-CM | POA: Diagnosis not present

## 2011-02-26 DIAGNOSIS — M6281 Muscle weakness (generalized): Secondary | ICD-10-CM | POA: Diagnosis not present

## 2011-02-26 DIAGNOSIS — Z471 Aftercare following joint replacement surgery: Secondary | ICD-10-CM | POA: Diagnosis not present

## 2011-03-02 DIAGNOSIS — Z96649 Presence of unspecified artificial hip joint: Secondary | ICD-10-CM | POA: Diagnosis not present

## 2011-03-02 DIAGNOSIS — R269 Unspecified abnormalities of gait and mobility: Secondary | ICD-10-CM | POA: Diagnosis not present

## 2011-03-02 DIAGNOSIS — Z471 Aftercare following joint replacement surgery: Secondary | ICD-10-CM | POA: Diagnosis not present

## 2011-03-02 DIAGNOSIS — M6281 Muscle weakness (generalized): Secondary | ICD-10-CM | POA: Diagnosis not present

## 2011-03-02 DIAGNOSIS — I10 Essential (primary) hypertension: Secondary | ICD-10-CM | POA: Diagnosis not present

## 2011-03-04 DIAGNOSIS — Z471 Aftercare following joint replacement surgery: Secondary | ICD-10-CM | POA: Diagnosis not present

## 2011-03-04 DIAGNOSIS — Z96649 Presence of unspecified artificial hip joint: Secondary | ICD-10-CM | POA: Diagnosis not present

## 2011-03-04 DIAGNOSIS — I10 Essential (primary) hypertension: Secondary | ICD-10-CM | POA: Diagnosis not present

## 2011-03-04 DIAGNOSIS — M1610 Unilateral primary osteoarthritis, unspecified hip: Secondary | ICD-10-CM | POA: Diagnosis not present

## 2011-03-04 DIAGNOSIS — R269 Unspecified abnormalities of gait and mobility: Secondary | ICD-10-CM | POA: Diagnosis not present

## 2011-03-04 DIAGNOSIS — M6281 Muscle weakness (generalized): Secondary | ICD-10-CM | POA: Diagnosis not present

## 2011-05-18 DIAGNOSIS — M1610 Unilateral primary osteoarthritis, unspecified hip: Secondary | ICD-10-CM | POA: Diagnosis not present

## 2011-08-24 DIAGNOSIS — M1610 Unilateral primary osteoarthritis, unspecified hip: Secondary | ICD-10-CM | POA: Diagnosis not present

## 2011-10-14 DIAGNOSIS — H01009 Unspecified blepharitis unspecified eye, unspecified eyelid: Secondary | ICD-10-CM | POA: Diagnosis not present

## 2011-10-14 DIAGNOSIS — H0230 Blepharochalasis unspecified eye, unspecified eyelid: Secondary | ICD-10-CM | POA: Diagnosis not present

## 2011-10-14 DIAGNOSIS — H11009 Unspecified pterygium of unspecified eye: Secondary | ICD-10-CM | POA: Diagnosis not present

## 2011-10-14 DIAGNOSIS — H2510 Age-related nuclear cataract, unspecified eye: Secondary | ICD-10-CM | POA: Diagnosis not present

## 2011-10-26 DIAGNOSIS — H2510 Age-related nuclear cataract, unspecified eye: Secondary | ICD-10-CM | POA: Diagnosis not present

## 2011-11-02 DIAGNOSIS — H25019 Cortical age-related cataract, unspecified eye: Secondary | ICD-10-CM | POA: Diagnosis not present

## 2011-11-02 DIAGNOSIS — H2510 Age-related nuclear cataract, unspecified eye: Secondary | ICD-10-CM | POA: Diagnosis not present

## 2011-11-25 DIAGNOSIS — M25519 Pain in unspecified shoulder: Secondary | ICD-10-CM | POA: Diagnosis not present

## 2011-12-09 DIAGNOSIS — M25519 Pain in unspecified shoulder: Secondary | ICD-10-CM | POA: Diagnosis not present

## 2011-12-14 ENCOUNTER — Other Ambulatory Visit: Payer: Self-pay | Admitting: Orthopaedic Surgery

## 2011-12-14 DIAGNOSIS — M25512 Pain in left shoulder: Secondary | ICD-10-CM

## 2011-12-19 ENCOUNTER — Ambulatory Visit
Admission: RE | Admit: 2011-12-19 | Discharge: 2011-12-19 | Disposition: A | Discharge status: Home / Self Care | Payer: Medicare Other | Source: Ambulatory Visit | Attending: Orthopaedic Surgery | Admitting: Orthopaedic Surgery

## 2011-12-19 DIAGNOSIS — R609 Edema, unspecified: Secondary | ICD-10-CM | POA: Diagnosis not present

## 2011-12-19 DIAGNOSIS — M25512 Pain in left shoulder: Secondary | ICD-10-CM

## 2011-12-19 DIAGNOSIS — M25419 Effusion, unspecified shoulder: Secondary | ICD-10-CM | POA: Diagnosis not present

## 2011-12-19 DIAGNOSIS — M6250 Muscle wasting and atrophy, not elsewhere classified, unspecified site: Secondary | ICD-10-CM | POA: Diagnosis not present

## 2011-12-25 DIAGNOSIS — M75120 Complete rotator cuff tear or rupture of unspecified shoulder, not specified as traumatic: Secondary | ICD-10-CM | POA: Diagnosis not present

## 2012-01-06 HISTORY — PX: SHOULDER ARTHROSCOPY W/ ROTATOR CUFF REPAIR: SHX2400

## 2012-01-25 DIAGNOSIS — H2510 Age-related nuclear cataract, unspecified eye: Secondary | ICD-10-CM | POA: Diagnosis not present

## 2012-01-25 DIAGNOSIS — H25019 Cortical age-related cataract, unspecified eye: Secondary | ICD-10-CM | POA: Diagnosis not present

## 2012-02-04 DIAGNOSIS — M67919 Unspecified disorder of synovium and tendon, unspecified shoulder: Secondary | ICD-10-CM | POA: Diagnosis not present

## 2012-02-04 DIAGNOSIS — M25819 Other specified joint disorders, unspecified shoulder: Secondary | ICD-10-CM | POA: Diagnosis not present

## 2012-02-04 DIAGNOSIS — M75120 Complete rotator cuff tear or rupture of unspecified shoulder, not specified as traumatic: Secondary | ICD-10-CM | POA: Diagnosis not present

## 2012-02-04 DIAGNOSIS — G8918 Other acute postprocedural pain: Secondary | ICD-10-CM | POA: Diagnosis not present

## 2012-02-04 DIAGNOSIS — M19019 Primary osteoarthritis, unspecified shoulder: Secondary | ICD-10-CM | POA: Diagnosis not present

## 2012-02-04 DIAGNOSIS — M659 Synovitis and tenosynovitis, unspecified: Secondary | ICD-10-CM | POA: Diagnosis not present

## 2012-02-04 DIAGNOSIS — M719 Bursopathy, unspecified: Secondary | ICD-10-CM | POA: Diagnosis not present

## 2012-02-23 DIAGNOSIS — M75120 Complete rotator cuff tear or rupture of unspecified shoulder, not specified as traumatic: Secondary | ICD-10-CM | POA: Diagnosis not present

## 2012-02-23 DIAGNOSIS — M25519 Pain in unspecified shoulder: Secondary | ICD-10-CM | POA: Diagnosis not present

## 2012-02-25 DIAGNOSIS — M25519 Pain in unspecified shoulder: Secondary | ICD-10-CM | POA: Diagnosis not present

## 2012-02-25 DIAGNOSIS — M75120 Complete rotator cuff tear or rupture of unspecified shoulder, not specified as traumatic: Secondary | ICD-10-CM | POA: Diagnosis not present

## 2012-03-01 DIAGNOSIS — M75120 Complete rotator cuff tear or rupture of unspecified shoulder, not specified as traumatic: Secondary | ICD-10-CM | POA: Diagnosis not present

## 2012-03-01 DIAGNOSIS — M25519 Pain in unspecified shoulder: Secondary | ICD-10-CM | POA: Diagnosis not present

## 2012-03-03 DIAGNOSIS — M75120 Complete rotator cuff tear or rupture of unspecified shoulder, not specified as traumatic: Secondary | ICD-10-CM | POA: Diagnosis not present

## 2012-03-03 DIAGNOSIS — M25519 Pain in unspecified shoulder: Secondary | ICD-10-CM | POA: Diagnosis not present

## 2012-03-10 DIAGNOSIS — M25519 Pain in unspecified shoulder: Secondary | ICD-10-CM | POA: Diagnosis not present

## 2012-03-10 DIAGNOSIS — M75120 Complete rotator cuff tear or rupture of unspecified shoulder, not specified as traumatic: Secondary | ICD-10-CM | POA: Diagnosis not present

## 2012-03-15 DIAGNOSIS — M75120 Complete rotator cuff tear or rupture of unspecified shoulder, not specified as traumatic: Secondary | ICD-10-CM | POA: Diagnosis not present

## 2012-03-15 DIAGNOSIS — M25519 Pain in unspecified shoulder: Secondary | ICD-10-CM | POA: Diagnosis not present

## 2012-03-17 DIAGNOSIS — M25519 Pain in unspecified shoulder: Secondary | ICD-10-CM | POA: Diagnosis not present

## 2012-03-17 DIAGNOSIS — M75120 Complete rotator cuff tear or rupture of unspecified shoulder, not specified as traumatic: Secondary | ICD-10-CM | POA: Diagnosis not present

## 2012-03-18 DIAGNOSIS — Z8546 Personal history of malignant neoplasm of prostate: Secondary | ICD-10-CM | POA: Diagnosis not present

## 2012-03-18 DIAGNOSIS — R209 Unspecified disturbances of skin sensation: Secondary | ICD-10-CM | POA: Diagnosis not present

## 2012-03-18 DIAGNOSIS — R109 Unspecified abdominal pain: Secondary | ICD-10-CM | POA: Diagnosis not present

## 2012-03-18 DIAGNOSIS — E7800 Pure hypercholesterolemia, unspecified: Secondary | ICD-10-CM | POA: Diagnosis not present

## 2012-03-18 DIAGNOSIS — I6990 Unspecified sequelae of unspecified cerebrovascular disease: Secondary | ICD-10-CM | POA: Diagnosis not present

## 2012-03-18 DIAGNOSIS — Z79899 Other long term (current) drug therapy: Secondary | ICD-10-CM | POA: Diagnosis not present

## 2012-03-18 DIAGNOSIS — I10 Essential (primary) hypertension: Secondary | ICD-10-CM | POA: Diagnosis not present

## 2012-03-18 DIAGNOSIS — R259 Unspecified abnormal involuntary movements: Secondary | ICD-10-CM | POA: Diagnosis not present

## 2012-03-22 ENCOUNTER — Other Ambulatory Visit: Payer: Self-pay | Admitting: Internal Medicine

## 2012-03-22 DIAGNOSIS — M75120 Complete rotator cuff tear or rupture of unspecified shoulder, not specified as traumatic: Secondary | ICD-10-CM | POA: Diagnosis not present

## 2012-03-22 DIAGNOSIS — M25519 Pain in unspecified shoulder: Secondary | ICD-10-CM | POA: Diagnosis not present

## 2012-03-22 DIAGNOSIS — R319 Hematuria, unspecified: Secondary | ICD-10-CM

## 2012-03-24 ENCOUNTER — Ambulatory Visit
Admission: RE | Admit: 2012-03-24 | Discharge: 2012-03-24 | Disposition: A | Discharge status: Home / Self Care | Payer: Medicare Other | Source: Ambulatory Visit | Attending: Internal Medicine | Admitting: Internal Medicine

## 2012-03-24 DIAGNOSIS — M75120 Complete rotator cuff tear or rupture of unspecified shoulder, not specified as traumatic: Secondary | ICD-10-CM | POA: Diagnosis not present

## 2012-03-24 DIAGNOSIS — N281 Cyst of kidney, acquired: Secondary | ICD-10-CM | POA: Diagnosis not present

## 2012-03-24 DIAGNOSIS — M25519 Pain in unspecified shoulder: Secondary | ICD-10-CM | POA: Diagnosis not present

## 2012-03-24 DIAGNOSIS — R319 Hematuria, unspecified: Secondary | ICD-10-CM

## 2012-03-29 DIAGNOSIS — M25519 Pain in unspecified shoulder: Secondary | ICD-10-CM | POA: Diagnosis not present

## 2012-03-29 DIAGNOSIS — M75120 Complete rotator cuff tear or rupture of unspecified shoulder, not specified as traumatic: Secondary | ICD-10-CM | POA: Diagnosis not present

## 2012-03-31 DIAGNOSIS — M25519 Pain in unspecified shoulder: Secondary | ICD-10-CM | POA: Diagnosis not present

## 2012-03-31 DIAGNOSIS — M75120 Complete rotator cuff tear or rupture of unspecified shoulder, not specified as traumatic: Secondary | ICD-10-CM | POA: Diagnosis not present

## 2012-04-05 DIAGNOSIS — M75120 Complete rotator cuff tear or rupture of unspecified shoulder, not specified as traumatic: Secondary | ICD-10-CM | POA: Diagnosis not present

## 2012-04-05 DIAGNOSIS — M25519 Pain in unspecified shoulder: Secondary | ICD-10-CM | POA: Diagnosis not present

## 2012-04-07 DIAGNOSIS — M75120 Complete rotator cuff tear or rupture of unspecified shoulder, not specified as traumatic: Secondary | ICD-10-CM | POA: Diagnosis not present

## 2012-04-07 DIAGNOSIS — M25519 Pain in unspecified shoulder: Secondary | ICD-10-CM | POA: Diagnosis not present

## 2012-04-12 DIAGNOSIS — M75120 Complete rotator cuff tear or rupture of unspecified shoulder, not specified as traumatic: Secondary | ICD-10-CM | POA: Diagnosis not present

## 2012-04-12 DIAGNOSIS — M25519 Pain in unspecified shoulder: Secondary | ICD-10-CM | POA: Diagnosis not present

## 2012-04-14 DIAGNOSIS — M25519 Pain in unspecified shoulder: Secondary | ICD-10-CM | POA: Diagnosis not present

## 2012-04-14 DIAGNOSIS — M75120 Complete rotator cuff tear or rupture of unspecified shoulder, not specified as traumatic: Secondary | ICD-10-CM | POA: Diagnosis not present

## 2012-04-19 DIAGNOSIS — M25519 Pain in unspecified shoulder: Secondary | ICD-10-CM | POA: Diagnosis not present

## 2012-04-19 DIAGNOSIS — M75120 Complete rotator cuff tear or rupture of unspecified shoulder, not specified as traumatic: Secondary | ICD-10-CM | POA: Diagnosis not present

## 2012-04-21 DIAGNOSIS — M75120 Complete rotator cuff tear or rupture of unspecified shoulder, not specified as traumatic: Secondary | ICD-10-CM | POA: Diagnosis not present

## 2012-04-21 DIAGNOSIS — M25519 Pain in unspecified shoulder: Secondary | ICD-10-CM | POA: Diagnosis not present

## 2012-04-26 DIAGNOSIS — M25519 Pain in unspecified shoulder: Secondary | ICD-10-CM | POA: Diagnosis not present

## 2012-04-26 DIAGNOSIS — M75120 Complete rotator cuff tear or rupture of unspecified shoulder, not specified as traumatic: Secondary | ICD-10-CM | POA: Diagnosis not present

## 2012-04-28 DIAGNOSIS — M25519 Pain in unspecified shoulder: Secondary | ICD-10-CM | POA: Diagnosis not present

## 2012-04-28 DIAGNOSIS — M75120 Complete rotator cuff tear or rupture of unspecified shoulder, not specified as traumatic: Secondary | ICD-10-CM | POA: Diagnosis not present

## 2012-05-03 DIAGNOSIS — M75120 Complete rotator cuff tear or rupture of unspecified shoulder, not specified as traumatic: Secondary | ICD-10-CM | POA: Diagnosis not present

## 2012-05-03 DIAGNOSIS — M25519 Pain in unspecified shoulder: Secondary | ICD-10-CM | POA: Diagnosis not present

## 2012-05-05 DIAGNOSIS — M25519 Pain in unspecified shoulder: Secondary | ICD-10-CM | POA: Diagnosis not present

## 2012-05-05 DIAGNOSIS — M75120 Complete rotator cuff tear or rupture of unspecified shoulder, not specified as traumatic: Secondary | ICD-10-CM | POA: Diagnosis not present

## 2012-05-10 DIAGNOSIS — M25519 Pain in unspecified shoulder: Secondary | ICD-10-CM | POA: Diagnosis not present

## 2012-05-10 DIAGNOSIS — M75120 Complete rotator cuff tear or rupture of unspecified shoulder, not specified as traumatic: Secondary | ICD-10-CM | POA: Diagnosis not present

## 2012-05-12 DIAGNOSIS — M75120 Complete rotator cuff tear or rupture of unspecified shoulder, not specified as traumatic: Secondary | ICD-10-CM | POA: Diagnosis not present

## 2012-05-12 DIAGNOSIS — M25519 Pain in unspecified shoulder: Secondary | ICD-10-CM | POA: Diagnosis not present

## 2012-05-16 DIAGNOSIS — M67919 Unspecified disorder of synovium and tendon, unspecified shoulder: Secondary | ICD-10-CM | POA: Diagnosis not present

## 2012-05-16 DIAGNOSIS — M25519 Pain in unspecified shoulder: Secondary | ICD-10-CM | POA: Diagnosis not present

## 2012-05-16 DIAGNOSIS — M719 Bursopathy, unspecified: Secondary | ICD-10-CM | POA: Diagnosis not present

## 2012-05-16 DIAGNOSIS — M75120 Complete rotator cuff tear or rupture of unspecified shoulder, not specified as traumatic: Secondary | ICD-10-CM | POA: Diagnosis not present

## 2012-05-17 DIAGNOSIS — M25519 Pain in unspecified shoulder: Secondary | ICD-10-CM | POA: Diagnosis not present

## 2012-05-17 DIAGNOSIS — M75120 Complete rotator cuff tear or rupture of unspecified shoulder, not specified as traumatic: Secondary | ICD-10-CM | POA: Diagnosis not present

## 2012-05-19 DIAGNOSIS — M75120 Complete rotator cuff tear or rupture of unspecified shoulder, not specified as traumatic: Secondary | ICD-10-CM | POA: Diagnosis not present

## 2012-05-19 DIAGNOSIS — M25519 Pain in unspecified shoulder: Secondary | ICD-10-CM | POA: Diagnosis not present

## 2012-05-24 DIAGNOSIS — M25519 Pain in unspecified shoulder: Secondary | ICD-10-CM | POA: Diagnosis not present

## 2012-05-24 DIAGNOSIS — M75120 Complete rotator cuff tear or rupture of unspecified shoulder, not specified as traumatic: Secondary | ICD-10-CM | POA: Diagnosis not present

## 2012-05-31 DIAGNOSIS — M75120 Complete rotator cuff tear or rupture of unspecified shoulder, not specified as traumatic: Secondary | ICD-10-CM | POA: Diagnosis not present

## 2012-05-31 DIAGNOSIS — M25519 Pain in unspecified shoulder: Secondary | ICD-10-CM | POA: Diagnosis not present

## 2012-06-08 DIAGNOSIS — M25519 Pain in unspecified shoulder: Secondary | ICD-10-CM | POA: Diagnosis not present

## 2012-06-08 DIAGNOSIS — M75120 Complete rotator cuff tear or rupture of unspecified shoulder, not specified as traumatic: Secondary | ICD-10-CM | POA: Diagnosis not present

## 2012-06-27 DIAGNOSIS — M75120 Complete rotator cuff tear or rupture of unspecified shoulder, not specified as traumatic: Secondary | ICD-10-CM | POA: Diagnosis not present

## 2012-06-27 DIAGNOSIS — M67919 Unspecified disorder of synovium and tendon, unspecified shoulder: Secondary | ICD-10-CM | POA: Diagnosis not present

## 2012-06-27 DIAGNOSIS — M719 Bursopathy, unspecified: Secondary | ICD-10-CM | POA: Diagnosis not present

## 2012-08-18 DIAGNOSIS — I10 Essential (primary) hypertension: Secondary | ICD-10-CM | POA: Diagnosis not present

## 2012-08-18 DIAGNOSIS — E785 Hyperlipidemia, unspecified: Secondary | ICD-10-CM | POA: Diagnosis not present

## 2012-08-18 DIAGNOSIS — I6990 Unspecified sequelae of unspecified cerebrovascular disease: Secondary | ICD-10-CM | POA: Diagnosis not present

## 2012-08-18 DIAGNOSIS — E559 Vitamin D deficiency, unspecified: Secondary | ICD-10-CM | POA: Diagnosis not present

## 2012-08-18 DIAGNOSIS — Z0000 Encounter for general adult medical examination without abnormal findings: Secondary | ICD-10-CM | POA: Diagnosis not present

## 2013-02-16 DIAGNOSIS — E785 Hyperlipidemia, unspecified: Secondary | ICD-10-CM | POA: Diagnosis not present

## 2013-02-16 DIAGNOSIS — Z23 Encounter for immunization: Secondary | ICD-10-CM | POA: Diagnosis not present

## 2013-02-16 DIAGNOSIS — I679 Cerebrovascular disease, unspecified: Secondary | ICD-10-CM | POA: Diagnosis not present

## 2013-02-16 DIAGNOSIS — C61 Malignant neoplasm of prostate: Secondary | ICD-10-CM | POA: Diagnosis not present

## 2013-02-16 DIAGNOSIS — I10 Essential (primary) hypertension: Secondary | ICD-10-CM | POA: Diagnosis not present

## 2013-02-16 DIAGNOSIS — R209 Unspecified disturbances of skin sensation: Secondary | ICD-10-CM | POA: Diagnosis not present

## 2013-02-27 DIAGNOSIS — G5600 Carpal tunnel syndrome, unspecified upper limb: Secondary | ICD-10-CM | POA: Diagnosis not present

## 2013-03-10 DIAGNOSIS — M19049 Primary osteoarthritis, unspecified hand: Secondary | ICD-10-CM | POA: Diagnosis not present

## 2013-03-10 DIAGNOSIS — M6530 Trigger finger, unspecified finger: Secondary | ICD-10-CM | POA: Diagnosis not present

## 2013-03-10 DIAGNOSIS — G5600 Carpal tunnel syndrome, unspecified upper limb: Secondary | ICD-10-CM | POA: Diagnosis not present

## 2013-03-17 ENCOUNTER — Other Ambulatory Visit: Payer: Self-pay | Admitting: Orthopedic Surgery

## 2013-03-24 ENCOUNTER — Encounter (HOSPITAL_BASED_OUTPATIENT_CLINIC_OR_DEPARTMENT_OTHER)
Admission: RE | Admit: 2013-03-24 | Discharge: 2013-03-24 | Disposition: A | Discharge status: Home / Self Care | Payer: Medicare Other | Source: Ambulatory Visit | Attending: Orthopedic Surgery | Admitting: Orthopedic Surgery

## 2013-03-24 ENCOUNTER — Encounter (HOSPITAL_BASED_OUTPATIENT_CLINIC_OR_DEPARTMENT_OTHER): Payer: Self-pay | Admitting: *Deleted

## 2013-03-24 DIAGNOSIS — Z01810 Encounter for preprocedural cardiovascular examination: Secondary | ICD-10-CM | POA: Insufficient documentation

## 2013-03-24 DIAGNOSIS — Z01812 Encounter for preprocedural laboratory examination: Secondary | ICD-10-CM | POA: Insufficient documentation

## 2013-03-24 LAB — BASIC METABOLIC PANEL
BUN: 17 mg/dL (ref 6–23)
CO2: 27 mEq/L (ref 19–32)
Calcium: 9.1 mg/dL (ref 8.4–10.5)
Chloride: 101 mEq/L (ref 96–112)
Creatinine, Ser: 1.05 mg/dL (ref 0.50–1.35)
GFR calc Af Amer: 76 mL/min — ABNORMAL LOW (ref >90)
GFR calc non Af Amer: 66 mL/min — ABNORMAL LOW (ref >90)
Glucose, Bld: 83 mg/dL (ref 70–99)
Potassium: 3.9 mEq/L (ref 3.7–5.3)
Sodium: 139 mEq/L (ref 137–147)

## 2013-03-24 NOTE — Progress Notes (Signed)
Pt lives alone-widowed-hx cva 2003-rt leg weakness-does not need a cane-drives-denies any cardiac problems-will come in for ekg-bmet-had sleep test yr ago-does not use a cpap-had shoulder surgery last yr-did not have to stay overnight,

## 2013-03-29 ENCOUNTER — Encounter (HOSPITAL_BASED_OUTPATIENT_CLINIC_OR_DEPARTMENT_OTHER): Payer: Medicare Other | Admitting: Anesthesiology

## 2013-03-29 ENCOUNTER — Ambulatory Visit (HOSPITAL_BASED_OUTPATIENT_CLINIC_OR_DEPARTMENT_OTHER): Payer: Medicare Other | Admitting: Anesthesiology

## 2013-03-29 ENCOUNTER — Ambulatory Visit (HOSPITAL_BASED_OUTPATIENT_CLINIC_OR_DEPARTMENT_OTHER)
Admission: RE | Admit: 2013-03-29 | Discharge: 2013-03-29 | Disposition: A | Discharge status: Home / Self Care | Payer: Medicare Other | Source: Ambulatory Visit | Attending: Orthopedic Surgery | Admitting: Orthopedic Surgery

## 2013-03-29 ENCOUNTER — Encounter (HOSPITAL_BASED_OUTPATIENT_CLINIC_OR_DEPARTMENT_OTHER)
Admission: RE | Disposition: A | Discharge status: Home / Self Care | Payer: Self-pay | Source: Ambulatory Visit | Attending: Orthopedic Surgery

## 2013-03-29 ENCOUNTER — Encounter (HOSPITAL_BASED_OUTPATIENT_CLINIC_OR_DEPARTMENT_OTHER): Payer: Self-pay | Admitting: Anesthesiology

## 2013-03-29 DIAGNOSIS — I10 Essential (primary) hypertension: Secondary | ICD-10-CM | POA: Diagnosis not present

## 2013-03-29 DIAGNOSIS — Z8711 Personal history of peptic ulcer disease: Secondary | ICD-10-CM | POA: Diagnosis not present

## 2013-03-29 DIAGNOSIS — K759 Inflammatory liver disease, unspecified: Secondary | ICD-10-CM | POA: Diagnosis not present

## 2013-03-29 DIAGNOSIS — Z8673 Personal history of transient ischemic attack (TIA), and cerebral infarction without residual deficits: Secondary | ICD-10-CM | POA: Diagnosis not present

## 2013-03-29 DIAGNOSIS — Z8546 Personal history of malignant neoplasm of prostate: Secondary | ICD-10-CM | POA: Insufficient documentation

## 2013-03-29 DIAGNOSIS — M1990 Unspecified osteoarthritis, unspecified site: Secondary | ICD-10-CM | POA: Diagnosis not present

## 2013-03-29 DIAGNOSIS — M65849 Other synovitis and tenosynovitis, unspecified hand: Principal | ICD-10-CM

## 2013-03-29 DIAGNOSIS — Z7902 Long term (current) use of antithrombotics/antiplatelets: Secondary | ICD-10-CM | POA: Insufficient documentation

## 2013-03-29 DIAGNOSIS — G4730 Sleep apnea, unspecified: Secondary | ICD-10-CM | POA: Diagnosis not present

## 2013-03-29 DIAGNOSIS — Z96649 Presence of unspecified artificial hip joint: Secondary | ICD-10-CM | POA: Diagnosis not present

## 2013-03-29 DIAGNOSIS — M65839 Other synovitis and tenosynovitis, unspecified forearm: Secondary | ICD-10-CM | POA: Diagnosis not present

## 2013-03-29 DIAGNOSIS — H269 Unspecified cataract: Secondary | ICD-10-CM | POA: Diagnosis not present

## 2013-03-29 DIAGNOSIS — G5600 Carpal tunnel syndrome, unspecified upper limb: Secondary | ICD-10-CM | POA: Diagnosis not present

## 2013-03-29 DIAGNOSIS — Z87891 Personal history of nicotine dependence: Secondary | ICD-10-CM | POA: Diagnosis not present

## 2013-03-29 DIAGNOSIS — M6530 Trigger finger, unspecified finger: Secondary | ICD-10-CM | POA: Diagnosis not present

## 2013-03-29 HISTORY — DX: Presence of spectacles and contact lenses: Z97.3

## 2013-03-29 HISTORY — PX: CARPAL TUNNEL RELEASE: SHX101

## 2013-03-29 HISTORY — DX: Presence of dental prosthetic device (complete) (partial): Z97.2

## 2013-03-29 HISTORY — PX: TRIGGER FINGER RELEASE: SHX641

## 2013-03-29 LAB — POCT HEMOGLOBIN-HEMACUE: Hemoglobin: 12.6 g/dL — ABNORMAL LOW (ref 13.0–17.0)

## 2013-03-29 SURGERY — CARPAL TUNNEL RELEASE
Anesthesia: General | Site: Hand | Laterality: Left

## 2013-03-29 MED ORDER — MIDAZOLAM HCL 5 MG/5ML IJ SOLN
INTRAMUSCULAR | Status: DC | PRN
  Administered 2013-03-29: 2 mg via INTRAVENOUS

## 2013-03-29 MED ORDER — FENTANYL CITRATE 0.05 MG/ML IJ SOLN: 50.0000 ug | INTRAMUSCULAR | Status: DC | PRN

## 2013-03-29 MED ORDER — MIDAZOLAM HCL 2 MG/2ML IJ SOLN: 1.0000 mg | INTRAMUSCULAR | Status: DC | PRN

## 2013-03-29 MED ORDER — ONDANSETRON HCL 4 MG/2ML IJ SOLN: 4.0000 mg | Freq: Once | INTRAMUSCULAR | Status: DC | PRN

## 2013-03-29 MED ORDER — CHLORHEXIDINE GLUCONATE 4 % EX LIQD: 60.0000 mL | Freq: Once | CUTANEOUS | Status: DC

## 2013-03-29 MED ORDER — ONDANSETRON HCL 4 MG/2ML IJ SOLN
INTRAMUSCULAR | Status: DC | PRN
  Administered 2013-03-29: 4 mg via INTRAVENOUS

## 2013-03-29 MED ORDER — OXYCODONE HCL 5 MG PO TABS: 5.0000 mg | ORAL_TABLET | Freq: Once | ORAL | Status: DC | PRN

## 2013-03-29 MED ORDER — DEXAMETHASONE SODIUM PHOSPHATE 10 MG/ML IJ SOLN
INTRAMUSCULAR | Status: DC | PRN
  Administered 2013-03-29: 10 mg via INTRAVENOUS

## 2013-03-29 MED ORDER — MIDAZOLAM HCL 2 MG/2ML IJ SOLN
INTRAMUSCULAR | Status: AC
  Filled 2013-03-29: qty 2

## 2013-03-29 MED ORDER — CEFAZOLIN SODIUM-DEXTROSE 2-3 GM-% IV SOLR: 2.0000 g | INTRAVENOUS | Status: DC

## 2013-03-29 MED ORDER — FENTANYL CITRATE 0.05 MG/ML IJ SOLN
INTRAMUSCULAR | Status: DC | PRN
  Administered 2013-03-29: 50 ug via INTRAVENOUS
  Administered 2013-03-29 (×2): 25 ug via INTRAVENOUS

## 2013-03-29 MED ORDER — CHLORHEXIDINE GLUCONATE 4 % EX LIQD
60.0000 mL | Freq: Once | CUTANEOUS | Status: DC
Start: 2013-03-29 — End: 2013-03-29

## 2013-03-29 MED ORDER — HYDROMORPHONE HCL PF 1 MG/ML IJ SOLN: 0.2500 mg | INTRAMUSCULAR | Status: DC | PRN

## 2013-03-29 MED ORDER — CEFAZOLIN SODIUM-DEXTROSE 2-3 GM-% IV SOLR
INTRAVENOUS | Status: AC
  Filled 2013-03-29: qty 50

## 2013-03-29 MED ORDER — OXYCODONE-ACETAMINOPHEN 10-325 MG PO TABS: 1.0000 | ORAL_TABLET | ORAL | Status: DC | PRN

## 2013-03-29 MED ORDER — MEPERIDINE HCL 25 MG/ML IJ SOLN: 6.2500 mg | INTRAMUSCULAR | Status: DC | PRN

## 2013-03-29 MED ORDER — LIDOCAINE HCL (CARDIAC) 20 MG/ML IV SOLN
INTRAVENOUS | Status: DC | PRN
  Administered 2013-03-29: 50 mg via INTRAVENOUS

## 2013-03-29 MED ORDER — BUPIVACAINE HCL (PF) 0.25 % IJ SOLN
INTRAMUSCULAR | Status: AC
  Filled 2013-03-29: qty 30

## 2013-03-29 MED ORDER — CEFAZOLIN SODIUM-DEXTROSE 2-3 GM-% IV SOLR
INTRAVENOUS | Status: DC | PRN
  Administered 2013-03-29: 2 g via INTRAVENOUS

## 2013-03-29 MED ORDER — OXYCODONE HCL 5 MG/5ML PO SOLN: 5.0000 mg | Freq: Once | ORAL | Status: DC | PRN

## 2013-03-29 MED ORDER — PROPOFOL 10 MG/ML IV BOLUS
INTRAVENOUS | Status: DC | PRN
  Administered 2013-03-29: 200 mg via INTRAVENOUS

## 2013-03-29 MED ORDER — PROPOFOL 10 MG/ML IV BOLUS: INTRAVENOUS | Status: DC | PRN

## 2013-03-29 MED ORDER — LACTATED RINGERS IV SOLN
INTRAVENOUS | Status: DC
Start: 2013-03-29 — End: 2013-03-29
  Administered 2013-03-29: 08:00:00 via INTRAVENOUS

## 2013-03-29 MED ORDER — FENTANYL CITRATE 0.05 MG/ML IJ SOLN
INTRAMUSCULAR | Status: AC
  Filled 2013-03-29: qty 4

## 2013-03-29 MED ORDER — LIDOCAINE HCL (PF) 1 % IJ SOLN
INTRAMUSCULAR | Status: AC
  Filled 2013-03-29: qty 5

## 2013-03-29 MED ORDER — BUPIVACAINE HCL (PF) 0.25 % IJ SOLN
INTRAMUSCULAR | Status: DC | PRN
  Administered 2013-03-29: 10 mL

## 2013-03-29 SURGICAL SUPPLY — 41 items
BANDAGE COBAN STERILE 2 (GAUZE/BANDAGES/DRESSINGS) ×3 IMPLANT
BLADE SURG 15 STRL LF DISP TIS (BLADE) ×2 IMPLANT
BLADE SURG 15 STRL SS (BLADE) ×1
BNDG COHESIVE 3X5 TAN STRL LF (GAUZE/BANDAGES/DRESSINGS) ×3 IMPLANT
BNDG ESMARK 4X9 LF (GAUZE/BANDAGES/DRESSINGS) ×3 IMPLANT
BNDG GAUZE ELAST 4 BULKY (GAUZE/BANDAGES/DRESSINGS) ×3 IMPLANT
CHLORAPREP W/TINT 26ML (MISCELLANEOUS) ×3 IMPLANT
CORDS BIPOLAR (ELECTRODE) ×3 IMPLANT
COVER MAYO STAND STRL (DRAPES) ×3 IMPLANT
COVER TABLE BACK 60X90 (DRAPES) ×3 IMPLANT
CUFF TOURNIQUET SINGLE 18IN (TOURNIQUET CUFF) ×3 IMPLANT
DECANTER SPIKE VIAL GLASS SM (MISCELLANEOUS) ×3 IMPLANT
DRAPE EXTREMITY T 121X128X90 (DRAPE) ×3 IMPLANT
DRAPE SURG 17X23 STRL (DRAPES) ×3 IMPLANT
DRSG KUZMA FLUFF (GAUZE/BANDAGES/DRESSINGS) ×3 IMPLANT
DRSG PAD ABDOMINAL 8X10 ST (GAUZE/BANDAGES/DRESSINGS) ×3 IMPLANT
GAUZE XEROFORM 1X8 LF (GAUZE/BANDAGES/DRESSINGS) ×3 IMPLANT
GLOVE BIOGEL PI IND STRL 7.0 (GLOVE) ×2 IMPLANT
GLOVE BIOGEL PI IND STRL 8.5 (GLOVE) ×2 IMPLANT
GLOVE BIOGEL PI INDICATOR 7.0 (GLOVE) ×1
GLOVE BIOGEL PI INDICATOR 8.5 (GLOVE) ×1
GLOVE ECLIPSE 7.0 STRL STRAW (GLOVE) ×3 IMPLANT
GLOVE SURG ORTHO 8.0 STRL STRW (GLOVE) ×3 IMPLANT
GOWN STRL REUS W/ TWL LRG LVL3 (GOWN DISPOSABLE) ×2 IMPLANT
GOWN STRL REUS W/TWL LRG LVL3 (GOWN DISPOSABLE) ×1
GOWN STRL REUS W/TWL XL LVL3 (GOWN DISPOSABLE) ×3 IMPLANT
NEEDLE 27GAX1X1/2 (NEEDLE) ×3 IMPLANT
NS IRRIG 1000ML POUR BTL (IV SOLUTION) ×3 IMPLANT
PACK BASIN DAY SURGERY FS (CUSTOM PROCEDURE TRAY) ×3 IMPLANT
PAD CAST 3X4 CTTN HI CHSV (CAST SUPPLIES) IMPLANT
PADDING CAST ABS 4INX4YD NS (CAST SUPPLIES) ×1
PADDING CAST ABS COTTON 4X4 ST (CAST SUPPLIES) ×2 IMPLANT
PADDING CAST COTTON 3X4 STRL (CAST SUPPLIES)
SPONGE GAUZE 4X4 12PLY (GAUZE/BANDAGES/DRESSINGS) ×3 IMPLANT
STOCKINETTE 4X48 STRL (DRAPES) ×3 IMPLANT
SUT VICRYL 4-0 PS2 18IN ABS (SUTURE) ×6 IMPLANT
SUT VICRYL RAPIDE 4/0 PS 2 (SUTURE) ×3 IMPLANT
SYR BULB 3OZ (MISCELLANEOUS) ×3 IMPLANT
SYR CONTROL 10ML LL (SYRINGE) ×3 IMPLANT
TOWEL OR 17X24 6PK STRL BLUE (TOWEL DISPOSABLE) ×6 IMPLANT
UNDERPAD 30X30 INCONTINENT (UNDERPADS AND DIAPERS) ×3 IMPLANT

## 2013-03-29 NOTE — Anesthesia Postprocedure Evaluation (Signed)
Anesthesia Post Note  Patient: Brian Brown  Procedure(s) Performed: Procedure(s) (LRB): LEFT CARPAL TUNNEL RELEASE (Left) LEFT MIDDLE/SMALL FINGER RELEASE A-1 PULLEY AND LEFT RING FINGER (Left)  Anesthesia type: general  Patient location: PACU  Post pain: Pain level controlled  Post assessment: Patient's Cardiovascular Status Stable  Last Vitals:  Filed Vitals:   03/29/13 1015  BP: 159/96  Pulse: 62  Temp: 36.4 C  Resp: 12    Post vital signs: Reviewed and stable  Level of consciousness: sedated  Complications: No apparent anesthesia complications

## 2013-03-29 NOTE — Op Note (Signed)
Dictation Number [id number]

## 2013-03-29 NOTE — Transfer of Care (Signed)
Immediate Anesthesia Transfer of Care Note  Patient: Brian Brown  Procedure(s) Performed: Procedure(s) with comments: LEFT CARPAL TUNNEL RELEASE (Left) - ANESTHESIA: IV REGIONAL FAB LEFT MIDDLE/SMALL FINGER RELEASE A-1 PULLEY AND LEFT RING FINGER (Left)  Patient Location: PACU  Anesthesia Type:General  Level of Consciousness: awake, alert , oriented and patient cooperative  Airway & Oxygen Therapy: Patient Spontanous Breathing and Patient connected to face mask oxygen  Post-op Assessment: Report given to PACU RN and Post -op Vital signs reviewed and stable  Post vital signs: Reviewed and stable  Complications: No apparent anesthesia complications

## 2013-03-29 NOTE — H&P (Signed)
Brian Brown is a 80 year old right hand dominant male complaining of bilateral hand pain, numbness and tingling, left greater than right with loss of use of his fingers. He has difficulty picking things up. He is dropping things. He has difficulty buttoning his buttons. He has no history of injury to the hand or neck. He is occasionally awakened at night. He has a history of arthritis. No history of diabetes, thyroid problems, or gout. He complains of a moderate burning pain with a feeling of numbness and weakness. He states it is getting worse. Rubbing his hands seems to help. He has not had any treatment for this. He has had nerve conductions done by Dr. Domingo Cocking on 02-27-13 revealing no response to the sensory component of the median nerve bilaterally. Motor delay of 11 on his left and no response on his right.   PAST MEDICAL HISTORY: He has no known drug allergies. He is on Diltiazem, Metoprolol, losartan, and simvastatin. He has a history of a stroke, right and left hip surgery, left shoulder surgery, hernia repair with total replacement of the hips and shoulders.  FAMILY H ISTORY: Positive for heart disease and high BP.  SOCIAL HISTORY: He does not smoke. He drinks socially. He is widowed and retired.  REVIEW OF SYSTEMS: Positive for prostate cancer, glasses, cataracts, high BP, Hepatitis, stroke and headaches. He does not know what kind of Hepatitis. This was in 55. Brian Brown is an 80 y.o. male.   Chief Complaint: CTS left trigger left middle ring and small HPI: see above  Past Medical History  Diagnosis Date  . Hypertension     treated by Dr Delfina Redwood, primary MD  . Bleeding stomach ulcer 25 yrs ago  . Hepatitis 1981    does not know what kind;denies jaundice  . Cancer 2002    prostate, s/p surgery  . Arthritis     degenerative joint disease  . Wears glasses   . Wears dentures     full top  . Sleep apnea 2005    does not wear a Cpap  . Stroke 2003    w/ Rt sided  weakness-some-rt leg    Past Surgical History  Procedure Laterality Date  . Joint replacement  12/2001    rt hip ;[facility]  . Hernia repair  2009    rt inguinal hernia  . Prostatectomy  2002  . Total hip arthroplasty  02/03/2011    Procedure: TOTAL HIP ARTHROPLASTY;  Surgeon: Garald Balding, MD;  Location: [facility];  Service: Orthopedics;  Laterality: Left;  . Colonoscopy    . Shoulder arthroscopy w/ rotator cuff repair  2014    lt-dr whitfield    History reviewed. No pertinent family history. Social History:  reports that he quit smoking about 5 years ago. His smoking use included Cigarettes. He has a 30 pack-year smoking history. He has never used smokeless tobacco. He reports that he does not drink alcohol or use illicit drugs.  Allergies: No Known Allergies  Medications Prior to Admission  Medication Sig Dispense Refill  . candesartan (ATACAND) 32 MG tablet Take 32 mg by mouth daily.      . clopidogrel (PLAVIX) 75 MG tablet Take 75 mg by mouth daily with breakfast.      . diltiazem (DILACOR XR) 240 MG 24 hr capsule Take 240 mg by mouth daily.      . metoprolol tartrate (LOPRESSOR) 25 MG tablet Take 25 mg by mouth 2 (two) times daily.      Marland Kitchen  simvastatin (ZOCOR) 40 MG tablet Take 40 mg by mouth every evening.        No results found for this or any previous visit (from the past 48 hour(s)).  No results found.   Pertinent items are noted in HPI.  Blood pressure 144/86, pulse 58, temperature 97.6 F (36.4 C), temperature source Oral, resp. rate 20, height 5\' 9"  (1.753 m), weight 203 lb 6.4 oz (92.262 kg), SpO2 99.00%.  General appearance: alert, cooperative and appears stated age Head: Normocephalic, without obvious abnormality Neck: no JVD Resp: clear to auscultation bilaterally Cardio: regular rate and rhythm, S1, S2 normal, no murmur, click, rub or gallop GI: soft, non-tender; bowel sounds normal; no masses,  no organomegaly Extremities: extremities normal,  atraumatic, no cyanosis or edema Pulses: 2+ and symmetric Skin: Skin color, texture, turgor normal. No rashes or lesions Neurologic: Grossly normal Incision/Wound: na  Assessment/Plan X-rays of his hand reveals STT arthritis, DIP joint arthritis.  Diagnosis: (1) Degenerative arthritis. (2) Carpal tunnel syndrome. (3) Trigger fingers middle and small left side.  The pre, peri and post op course are discussed along with risks and complications.  He is aware there is no guarantee with surgery, possibility of infection, recurrence, injury to arteries, nerves and tendons, incomplete relief of symptoms and dystrophy. He is scheduled for his left carpal tunnel release and trigger finger releases middle and small. Questions were invited and answered in detail.  He has noted triggering fo the left ring finger also. We will release that finger also.  Lydia Toren R 03/29/2013, 7:40 AM

## 2013-03-29 NOTE — Op Note (Signed)
Brian Brown, Brian Brown NO.:  [id number]  MEDICAL RECORD NO.:  [id number]  LOCATION:                                 FACILITY:  PHYSICIAN:  Daryll Brod, M.D.            DATE OF BIRTH:  DATE OF PROCEDURE:  03/29/2013 DATE OF DISCHARGE:                              OPERATIVE REPORT   PREOPERATIVE DIAGNOSIS: 1. Stenosing tenosynovitis, left middle ring and small fingers. 2. Carpal tunnel syndrome, left hand.  POSTOPERATIVE DIAGNOSES: 1. Stenosing tenosynovitis, left middle ring and small fingers. 2. Carpal tunnel syndrome, left hand.  OPERATION:  Release A1 pulley, left middle ring and small fingers, with carpal tunnel release, left hand.  SURGEON:  Daryll Brod, MD.  ANESTHESIA:  General with local infiltration.  ANESTHESIOLOGIST:  Crissie Sickles. Conrad Staplehurst, M.D.  HISTORY:  The patient is a 80 year old male with a history of stenosing tenosynovitis of multiple digits, carpal tunnel syndrome, nerve conductions positive, and this has not responded to conservative treatment.  He has elected to undergo surgical decompression to the median nerve along with middle ring and small fingers.  He was seen in the preoperative area, where the extremity was marked by both patient and surgeon.  Antibiotic given.  PROCEDURE IN DETAIL:  The patient was brought to the operating room, where a general anesthetic was carried out without difficulty under the direction of Dr. Conrad , he was prepped using ChloraPrep, supine position, left arm free.  A 3-minute dry time was allowed.  Time-out taken, confirming the patient and procedure.  The limb was exsanguinated with an Esmarch bandage.  Tourniquet placed high on the arm was inflated to 250 mmHg.  The trigger fingers were tended to 1st an incision made on the small finger A1 pulley area carried down through subcutaneous tissue.  Bleeders were electrocauterized.  Neurovascular bundles were identified and protected.  The A1 pulley was  immediately identified, found to be markedly thickened and this was released on its radial aspect.  A small incision made centrally in A2.  Partial tenosynovectomy was performed proximally.  The finger placed through a full range motion, no further triggering was noted.  A separate incision was then made over the ring finger, obliquely in nature, carried down through subcutaneous tissue.  Again, neurovascular bundles were identified, protected with retractors.  An incision made on the radial aspect of the A1 pulley which again was found to be markedly thickened.  A very significant adherent tenosynovitis was present proximally.  This was released.  The A2 pulley was incised centrally for a very short distance.  The finger placed through a full range motion.  The 2 tendons separated.  No further lesions were identified.  No trigger was noted. A separate incision was then made on the middle finger A1 pulley area, carried down through subcutaneous tissue.  Bleeders again electrocauterized.  The A1 pulley was identified.  This was released on its radial aspect.  Again, found to be markedly thickened.  A moderate adherent tenosynovitis was present proximally.  This was relieved, partially excised, the 2 tendons separated, a small incision made centrally in A2, and again no further triggering was noted.  Each  of these wounds were then irrigated with saline and closed with interrupted #4-0 Vicryl Rapide sutures.  Separate incision was then made over the mid palm longitudinally in nature, carried down through subcutaneous tissue.  Bleeders were again electrocauterized.  Palmar fascia was split.  Superficial palmar arch identified.  Flexor tendon to the ring little finger identified to the ulnar side of the median nerve.  Carpal retinaculum was incised with sharp dissection.  Right angle and Sewall retractor were placed between the skin and forearm fascia.  The forearm fascia was released for  approximately 2 cm proximal to the wrist crease under direct vision.  Canal was explored.  Air compression to the nerve was apparent.  Motor branch entered into muscle.  No further lesions were identified.  The wound was irrigated with saline and closed with interrupted #4-0 Vicryl Rapide sutures.  Local infiltration to each of the incision was then made with 0.25% Marcaine without epinephrine, 10 mL was used.  Sterile compressive dressing with the fingers free was applied.  On deflation of the tourniquet, all fingers immediately pinked.  He was taken to the recovery room for observation in satisfactory condition.  He will be discharged home to return in 1 week on Vicodin.          ______________________________ Daryll Brod, M.D.     GK/MEDQ  D:  03/29/2013  T:  03/29/2013  Job:  [id number]

## 2013-03-29 NOTE — Brief Op Note (Signed)
03/29/2013  9:29 AM  PATIENT:  [name]  80 y.o. male  PRE-OPERATIVE DIAGNOSIS:  LEFT CARPAL TUNNEL SYNDROME/STENOSING TENOSYNOVITIS SYNDROME LEFT MIDDLE/SMALL FINGER  POST-OPERATIVE DIAGNOSIS:  LEFT CARPAL TUNNEL SYNDROME/STENOSING  PROCEDURE:  Procedure(s) with comments: LEFT CARPAL TUNNEL RELEASE (Left) - ANESTHESIA: IV REGIONAL FAB LEFT MIDDLE/SMALL FINGER RELEASE A-1 PULLEY AND LEFT RING FINGER (Left)  SURGEON:  Surgeon(s) and Role:    * Wynonia Sours, MD - Primary  PHYSICIAN ASSISTANT:   ASSISTANTS: none   ANESTHESIA:   local and general  EBL:  Total I/O In: 800 [I.V.:800] Out: -   BLOOD ADMINISTERED:none  DRAINS: none   LOCAL MEDICATIONS USED:  BUPIVICAINE   SPECIMEN:  No Specimen  DISPOSITION OF SPECIMEN:  N/A  COUNTS:  YES  TOURNIQUET:   Total Tourniquet Time Documented: Upper Arm (Left) - 29 minutes Total: Upper Arm (Left) - 29 minutes   DICTATION: .Other Dictation: Dictation Number [id number]  PLAN OF CARE: Discharge to home after PACU  PATIENT DISPOSITION:  PACU - hemodynamically stable.

## 2013-03-29 NOTE — Discharge Instructions (Addendum)

## 2013-03-29 NOTE — Anesthesia Preprocedure Evaluation (Signed)
Anesthesia Evaluation  Patient identified by MRN, date of birth, ID band Patient awake    Reviewed: Allergy & Precautions, H&P , NPO status , Patient's Chart, lab work & pertinent test results  Airway Mallampati: I TM Distance: >3 FB Neck ROM: Full    Dental   Pulmonary sleep apnea , former smoker,          Cardiovascular hypertension, Pt. on medications     Neuro/Psych CVA    GI/Hepatic PUD,   Endo/Other    Renal/GU      Musculoskeletal   Abdominal   Peds  Hematology   Anesthesia Other Findings   Reproductive/Obstetrics                           Anesthesia Physical Anesthesia Plan  ASA: III  Anesthesia Plan: General   Post-op Pain Management:    Induction: Intravenous  Airway Management Planned: LMA  Additional Equipment:   Intra-op Plan:   Post-operative Plan: Extubation in OR  Informed Consent: I have reviewed the patients History and Physical, chart, labs and discussed the procedure including the risks, benefits and alternatives for the proposed anesthesia with the patient or authorized representative who has indicated his/her understanding and acceptance.     Plan Discussed with: CRNA and Surgeon  Anesthesia Plan Comments:         Anesthesia Quick Evaluation

## 2013-03-30 ENCOUNTER — Encounter (HOSPITAL_BASED_OUTPATIENT_CLINIC_OR_DEPARTMENT_OTHER): Payer: Self-pay | Admitting: Orthopedic Surgery

## 2013-06-13 DIAGNOSIS — Z961 Presence of intraocular lens: Secondary | ICD-10-CM | POA: Diagnosis not present

## 2013-06-13 DIAGNOSIS — H02839 Dermatochalasis of unspecified eye, unspecified eyelid: Secondary | ICD-10-CM | POA: Diagnosis not present

## 2013-08-24 DIAGNOSIS — E785 Hyperlipidemia, unspecified: Secondary | ICD-10-CM | POA: Diagnosis not present

## 2013-08-24 DIAGNOSIS — Z8546 Personal history of malignant neoplasm of prostate: Secondary | ICD-10-CM | POA: Diagnosis not present

## 2013-08-24 DIAGNOSIS — I679 Cerebrovascular disease, unspecified: Secondary | ICD-10-CM | POA: Diagnosis not present

## 2013-08-24 DIAGNOSIS — Z0000 Encounter for general adult medical examination without abnormal findings: Secondary | ICD-10-CM | POA: Diagnosis not present

## 2013-08-24 DIAGNOSIS — I10 Essential (primary) hypertension: Secondary | ICD-10-CM | POA: Diagnosis not present

## 2013-09-19 DIAGNOSIS — Z1211 Encounter for screening for malignant neoplasm of colon: Secondary | ICD-10-CM | POA: Diagnosis not present

## 2013-09-19 DIAGNOSIS — Z8673 Personal history of transient ischemic attack (TIA), and cerebral infarction without residual deficits: Secondary | ICD-10-CM | POA: Diagnosis not present

## 2013-09-28 DIAGNOSIS — Z1211 Encounter for screening for malignant neoplasm of colon: Secondary | ICD-10-CM | POA: Diagnosis not present

## 2013-10-02 DIAGNOSIS — G5600 Carpal tunnel syndrome, unspecified upper limb: Secondary | ICD-10-CM | POA: Diagnosis not present

## 2013-10-02 DIAGNOSIS — M6530 Trigger finger, unspecified finger: Secondary | ICD-10-CM | POA: Diagnosis not present

## 2013-12-26 DIAGNOSIS — D100 Benign neoplasm of lip: Secondary | ICD-10-CM | POA: Diagnosis not present

## 2014-01-01 DIAGNOSIS — G5602 Carpal tunnel syndrome, left upper limb: Secondary | ICD-10-CM | POA: Diagnosis not present

## 2014-01-01 DIAGNOSIS — M65341 Trigger finger, right ring finger: Secondary | ICD-10-CM | POA: Diagnosis not present

## 2014-01-01 DIAGNOSIS — G5601 Carpal tunnel syndrome, right upper limb: Secondary | ICD-10-CM | POA: Diagnosis not present

## 2014-01-09 DIAGNOSIS — D100 Benign neoplasm of lip: Secondary | ICD-10-CM | POA: Diagnosis not present

## 2014-01-09 DIAGNOSIS — K1120 Sialoadenitis, unspecified: Secondary | ICD-10-CM | POA: Diagnosis not present

## 2014-01-15 DIAGNOSIS — R05 Cough: Secondary | ICD-10-CM | POA: Diagnosis not present

## 2014-01-15 DIAGNOSIS — H6690 Otitis media, unspecified, unspecified ear: Secondary | ICD-10-CM | POA: Diagnosis not present

## 2014-02-22 DIAGNOSIS — E785 Hyperlipidemia, unspecified: Secondary | ICD-10-CM | POA: Diagnosis not present

## 2014-02-22 DIAGNOSIS — G5600 Carpal tunnel syndrome, unspecified upper limb: Secondary | ICD-10-CM | POA: Diagnosis not present

## 2014-02-22 DIAGNOSIS — Z23 Encounter for immunization: Secondary | ICD-10-CM | POA: Diagnosis not present

## 2014-02-22 DIAGNOSIS — I6789 Other cerebrovascular disease: Secondary | ICD-10-CM | POA: Diagnosis not present

## 2014-02-22 DIAGNOSIS — I10 Essential (primary) hypertension: Secondary | ICD-10-CM | POA: Diagnosis not present

## 2014-02-22 DIAGNOSIS — C61 Malignant neoplasm of prostate: Secondary | ICD-10-CM | POA: Diagnosis not present

## 2014-02-28 ENCOUNTER — Other Ambulatory Visit: Payer: Self-pay | Admitting: Orthopedic Surgery

## 2014-02-28 DIAGNOSIS — G5601 Carpal tunnel syndrome, right upper limb: Secondary | ICD-10-CM | POA: Diagnosis not present

## 2014-02-28 DIAGNOSIS — M65331 Trigger finger, right middle finger: Secondary | ICD-10-CM | POA: Diagnosis not present

## 2014-02-28 DIAGNOSIS — M05341 Rheumatoid heart disease with rheumatoid arthritis of right hand: Secondary | ICD-10-CM | POA: Diagnosis not present

## 2014-03-01 ENCOUNTER — Encounter (HOSPITAL_BASED_OUTPATIENT_CLINIC_OR_DEPARTMENT_OTHER): Payer: Self-pay | Admitting: *Deleted

## 2014-03-01 NOTE — Progress Notes (Signed)
Pt was here 3/15 for lt ct-did well iv reg-ekg still good-will need istat

## 2014-03-06 ENCOUNTER — Encounter (HOSPITAL_BASED_OUTPATIENT_CLINIC_OR_DEPARTMENT_OTHER)
Admission: RE | Disposition: A | Discharge status: Home / Self Care | Payer: Self-pay | Source: Ambulatory Visit | Attending: Orthopedic Surgery

## 2014-03-06 ENCOUNTER — Ambulatory Visit (HOSPITAL_BASED_OUTPATIENT_CLINIC_OR_DEPARTMENT_OTHER)
Admission: RE | Admit: 2014-03-06 | Discharge: 2014-03-06 | Disposition: A | Discharge status: Home / Self Care | Payer: Medicare Other | Source: Ambulatory Visit | Attending: Orthopedic Surgery | Admitting: Orthopedic Surgery

## 2014-03-06 ENCOUNTER — Ambulatory Visit (HOSPITAL_BASED_OUTPATIENT_CLINIC_OR_DEPARTMENT_OTHER): Payer: Medicare Other | Admitting: Certified Registered"

## 2014-03-06 ENCOUNTER — Encounter (HOSPITAL_BASED_OUTPATIENT_CLINIC_OR_DEPARTMENT_OTHER): Payer: Self-pay | Admitting: Certified Registered"

## 2014-03-06 DIAGNOSIS — M65331 Trigger finger, right middle finger: Secondary | ICD-10-CM | POA: Insufficient documentation

## 2014-03-06 DIAGNOSIS — Z8711 Personal history of peptic ulcer disease: Secondary | ICD-10-CM | POA: Insufficient documentation

## 2014-03-06 DIAGNOSIS — Z8546 Personal history of malignant neoplasm of prostate: Secondary | ICD-10-CM | POA: Diagnosis not present

## 2014-03-06 DIAGNOSIS — G5601 Carpal tunnel syndrome, right upper limb: Secondary | ICD-10-CM | POA: Insufficient documentation

## 2014-03-06 DIAGNOSIS — I10 Essential (primary) hypertension: Secondary | ICD-10-CM | POA: Diagnosis not present

## 2014-03-06 DIAGNOSIS — M65341 Trigger finger, right ring finger: Secondary | ICD-10-CM | POA: Insufficient documentation

## 2014-03-06 DIAGNOSIS — Z96643 Presence of artificial hip joint, bilateral: Secondary | ICD-10-CM | POA: Diagnosis not present

## 2014-03-06 DIAGNOSIS — Z8673 Personal history of transient ischemic attack (TIA), and cerebral infarction without residual deficits: Secondary | ICD-10-CM | POA: Insufficient documentation

## 2014-03-06 DIAGNOSIS — M65841 Other synovitis and tenosynovitis, right hand: Secondary | ICD-10-CM | POA: Diagnosis not present

## 2014-03-06 DIAGNOSIS — Z87891 Personal history of nicotine dependence: Secondary | ICD-10-CM | POA: Insufficient documentation

## 2014-03-06 HISTORY — PX: CARPAL TUNNEL RELEASE: SHX101

## 2014-03-06 HISTORY — PX: TRIGGER FINGER RELEASE: SHX641

## 2014-03-06 LAB — POCT I-STAT, CHEM 8
BUN: 16 mg/dL (ref 6–23)
Calcium, Ion: 1.22 mmol/L (ref 1.13–1.30)
Chloride: 104 mmol/L (ref 96–112)
Creatinine, Ser: 1 mg/dL (ref 0.50–1.35)
Glucose, Bld: 100 mg/dL — ABNORMAL HIGH (ref 70–99)
HCT: 46 % (ref 39.0–52.0)
Hemoglobin: 15.6 g/dL (ref 13.0–17.0)
Potassium: 3.9 mmol/L (ref 3.5–5.1)
Sodium: 145 mmol/L (ref 135–145)
TCO2: 26 mmol/L (ref 0–100)

## 2014-03-06 SURGERY — CARPAL TUNNEL RELEASE
Anesthesia: General | Site: Hand | Laterality: Right

## 2014-03-06 MED ORDER — OXYCODONE HCL 5 MG/5ML PO SOLN: 5.0000 mg | Freq: Once | ORAL | Status: DC | PRN

## 2014-03-06 MED ORDER — CHLORHEXIDINE GLUCONATE 4 % EX LIQD: 60.0000 mL | Freq: Once | CUTANEOUS | Status: DC

## 2014-03-06 MED ORDER — ONDANSETRON HCL 4 MG/2ML IJ SOLN: 4.0000 mg | Freq: Four times a day (QID) | INTRAMUSCULAR | Status: DC | PRN

## 2014-03-06 MED ORDER — CEFAZOLIN SODIUM-DEXTROSE 2-3 GM-% IV SOLR: 2.0000 g | INTRAVENOUS | Status: DC

## 2014-03-06 MED ORDER — PROPOFOL 10 MG/ML IV BOLUS
INTRAVENOUS | Status: DC | PRN
  Administered 2014-03-06: 200 mg via INTRAVENOUS

## 2014-03-06 MED ORDER — BUPIVACAINE HCL (PF) 0.25 % IJ SOLN
INTRAMUSCULAR | Status: AC
  Filled 2014-03-06: qty 30

## 2014-03-06 MED ORDER — ONDANSETRON HCL 4 MG/2ML IJ SOLN
INTRAMUSCULAR | Status: DC | PRN
  Administered 2014-03-06: 4 mg via INTRAVENOUS

## 2014-03-06 MED ORDER — CEFAZOLIN SODIUM-DEXTROSE 2-3 GM-% IV SOLR
2.0000 g | INTRAVENOUS | Status: AC
  Administered 2014-03-06: 2 g via INTRAVENOUS

## 2014-03-06 MED ORDER — FENTANYL CITRATE 0.05 MG/ML IJ SOLN
25.0000 ug | INTRAMUSCULAR | Status: DC | PRN
  Administered 2014-03-06: 25 ug via INTRAVENOUS

## 2014-03-06 MED ORDER — CEFAZOLIN SODIUM-DEXTROSE 2-3 GM-% IV SOLR
INTRAVENOUS | Status: AC
  Filled 2014-03-06: qty 50

## 2014-03-06 MED ORDER — FENTANYL CITRATE 0.05 MG/ML IJ SOLN
INTRAMUSCULAR | Status: AC
  Filled 2014-03-06: qty 4

## 2014-03-06 MED ORDER — OXYCODONE HCL 5 MG PO TABS: 5.0000 mg | ORAL_TABLET | Freq: Once | ORAL | Status: DC | PRN

## 2014-03-06 MED ORDER — MIDAZOLAM HCL 2 MG/2ML IJ SOLN: 1.0000 mg | INTRAMUSCULAR | Status: DC | PRN

## 2014-03-06 MED ORDER — LACTATED RINGERS IV SOLN
INTRAVENOUS | Status: DC
Start: 2014-03-06 — End: 2014-03-06
  Administered 2014-03-06 (×2): via INTRAVENOUS

## 2014-03-06 MED ORDER — LIDOCAINE HCL (CARDIAC) 20 MG/ML IV SOLN
INTRAVENOUS | Status: DC | PRN
  Administered 2014-03-06: 80 mg via INTRAVENOUS

## 2014-03-06 MED ORDER — FENTANYL CITRATE 0.05 MG/ML IJ SOLN
INTRAMUSCULAR | Status: AC
  Filled 2014-03-06: qty 2

## 2014-03-06 MED ORDER — MIDAZOLAM HCL 2 MG/2ML IJ SOLN
INTRAMUSCULAR | Status: AC
  Filled 2014-03-06: qty 2

## 2014-03-06 MED ORDER — BUPIVACAINE HCL (PF) 0.25 % IJ SOLN
INTRAMUSCULAR | Status: DC | PRN
  Administered 2014-03-06: 10 mL

## 2014-03-06 MED ORDER — FENTANYL CITRATE 0.05 MG/ML IJ SOLN
INTRAMUSCULAR | Status: DC | PRN
  Administered 2014-03-06: 50 ug via INTRAVENOUS
  Administered 2014-03-06: 25 ug via INTRAVENOUS

## 2014-03-06 MED ORDER — HYDROCODONE-ACETAMINOPHEN 5-325 MG PO TABS: 1.0000 | ORAL_TABLET | Freq: Four times a day (QID) | ORAL | Status: DC | PRN

## 2014-03-06 MED ORDER — DEXAMETHASONE SODIUM PHOSPHATE 10 MG/ML IJ SOLN
INTRAMUSCULAR | Status: DC | PRN
  Administered 2014-03-06: 10 mg via INTRAVENOUS

## 2014-03-06 MED ORDER — FENTANYL CITRATE 0.05 MG/ML IJ SOLN: 50.0000 ug | INTRAMUSCULAR | Status: DC | PRN

## 2014-03-06 SURGICAL SUPPLY — 39 items
BANDAGE COBAN STERILE 2 (GAUZE/BANDAGES/DRESSINGS) ×2 IMPLANT
BLADE SURG 15 STRL LF DISP TIS (BLADE) ×1 IMPLANT
BLADE SURG 15 STRL SS (BLADE) ×1
BNDG COHESIVE 3X5 TAN STRL LF (GAUZE/BANDAGES/DRESSINGS) ×2 IMPLANT
BNDG ESMARK 4X9 LF (GAUZE/BANDAGES/DRESSINGS) ×2 IMPLANT
BNDG GAUZE ELAST 4 BULKY (GAUZE/BANDAGES/DRESSINGS) ×2 IMPLANT
CHLORAPREP W/TINT 26ML (MISCELLANEOUS) ×2 IMPLANT
CORDS BIPOLAR (ELECTRODE) ×2 IMPLANT
COVER BACK TABLE 60X90IN (DRAPES) ×2 IMPLANT
COVER MAYO STAND STRL (DRAPES) ×2 IMPLANT
CUFF TOURNIQUET SINGLE 18IN (TOURNIQUET CUFF) ×2 IMPLANT
DECANTER SPIKE VIAL GLASS SM (MISCELLANEOUS) IMPLANT
DRAPE EXTREMITY T 121X128X90 (DRAPE) ×2 IMPLANT
DRAPE SURG 17X23 STRL (DRAPES) ×2 IMPLANT
DRSG PAD ABDOMINAL 8X10 ST (GAUZE/BANDAGES/DRESSINGS) ×2 IMPLANT
GAUZE SPONGE 4X4 12PLY STRL (GAUZE/BANDAGES/DRESSINGS) ×4 IMPLANT
GAUZE XEROFORM 1X8 LF (GAUZE/BANDAGES/DRESSINGS) ×2 IMPLANT
GLOVE BIO SURGEON STRL SZ 6.5 (GLOVE) ×4 IMPLANT
GLOVE BIO SURGEON STRL SZ7.5 (GLOVE) ×2 IMPLANT
GLOVE BIOGEL PI IND STRL 7.0 (GLOVE) ×3 IMPLANT
GLOVE BIOGEL PI IND STRL 8 (GLOVE) ×1 IMPLANT
GLOVE BIOGEL PI IND STRL 8.5 (GLOVE) ×1 IMPLANT
GLOVE BIOGEL PI INDICATOR 7.0 (GLOVE) ×3
GLOVE BIOGEL PI INDICATOR 8 (GLOVE) ×1
GLOVE BIOGEL PI INDICATOR 8.5 (GLOVE) ×1
GLOVE SURG ORTHO 8.0 STRL STRW (GLOVE) ×2 IMPLANT
GOWN STRL REUS W/ TWL LRG LVL3 (GOWN DISPOSABLE) ×1 IMPLANT
GOWN STRL REUS W/TWL LRG LVL3 (GOWN DISPOSABLE) ×1
GOWN STRL REUS W/TWL XL LVL3 (GOWN DISPOSABLE) ×4 IMPLANT
NEEDLE PRECISIONGLIDE 27X1.5 (NEEDLE) ×2 IMPLANT
NS IRRIG 1000ML POUR BTL (IV SOLUTION) ×2 IMPLANT
PACK BASIN DAY SURGERY FS (CUSTOM PROCEDURE TRAY) ×2 IMPLANT
STOCKINETTE 4X48 STRL (DRAPES) ×2 IMPLANT
SUT ETHILON 4 0 PS 2 18 (SUTURE) ×2 IMPLANT
SUT VICRYL 4-0 PS2 18IN ABS (SUTURE) IMPLANT
SYR BULB 3OZ (MISCELLANEOUS) ×2 IMPLANT
SYR CONTROL 10ML LL (SYRINGE) ×2 IMPLANT
TOWEL OR 17X24 6PK STRL BLUE (TOWEL DISPOSABLE) ×4 IMPLANT
UNDERPAD 30X30 INCONTINENT (UNDERPADS AND DIAPERS) ×2 IMPLANT

## 2014-03-06 NOTE — H&P (Signed)
Brian Brown is now 81 years-old.  He is post release of his left carpal tunnel, multiple trigger fingers left side. He has positive nerve conductions revealing severe carpal tunnel syndrome bilaterally with no response to the sensory component and a motor delay of 11 on his right side.  He has undergone left carpal tunnel release with reasonable success for his age.  He states that this will sometimes awaken him at night. He has history of stroke in 2003, Bell's palsy, high blood pressure, prostate cancer and history of hepatitis.  He states his right side has got progressively worse.  He is also complaining of catching of his right hand, middle and ring fingers to the point where he has to straighten these with his opposite hand.  It is especially bad in the morning.  He complains of an intermittent, moderate stabbing burning type pain with a feeling of numbness, it is getting worse. Activity makes it worse.   ALLERGIES:    None.  MEDICATIONS:     Metoprolol, simvastatin, diltiazem, candesartan and Chloropidae.  SURGICAL HISTORY:    Prostate surgery,  bilateral hip surgeries (replacements), rotator cuff repair.  FAMILY MEDICAL HISTORY:   Positive for heart disease, otherwise negative.  SOCIAL HISTORY:    He does not smoke, drinks socially, he is widowed and retired.   REVIEW OF SYSTEMS:    Positive for glasses, high blood pressure, hepatitis, stomach ulcer, headaches, otherwise negative 14 points Brian Brown is an 81 y.o. male.   Chief Complaint: CTS right and trigger fingers right middle and ring HPI: see above  Past Medical History  Diagnosis Date  . Hypertension     treated by Dr Brian Brown, primary MD  . Bleeding stomach ulcer 25 yrs ago  . Hepatitis 1981    does not know what kind;denies jaundice  . Cancer 2002    prostate, s/p surgery  . Arthritis     degenerative joint disease  . Wears glasses   . Wears dentures     full top  . Sleep apnea 2005    does not wear a Cpap  .  Stroke 2003    w/ Rt sided weakness-some-rt leg    Past Surgical History  Procedure Laterality Date  . Joint replacement  12/2001    rt hip ;[facility]  . Hernia repair  2009    rt inguinal hernia  . Prostatectomy  2002  . Total hip arthroplasty  02/03/2011    Procedure: TOTAL HIP ARTHROPLASTY;  Surgeon: Brian Balding, MD;  Location: [facility];  Service: Orthopedics;  Laterality: Left;  . Colonoscopy    . Shoulder arthroscopy w/ rotator cuff repair  2014    lt-dr whitfield  . Carpal tunnel release Left 03/29/2013    Procedure: LEFT CARPAL TUNNEL RELEASE;  Surgeon: Wynonia Sours, MD;  Location: [place];  Service: Orthopedics;  Laterality: Left;  ANESTHESIA: IV REGIONAL FAB  . Trigger finger release Left 03/29/2013    Procedure: LEFT MIDDLE/SMALL FINGER RELEASE A-1 PULLEY AND LEFT RING FINGER;  Surgeon: Wynonia Sours, MD;  Location: [place];  Service: Orthopedics;  Laterality: Left;    History reviewed. No pertinent family history. Social History:  reports that he quit smoking about 6 years ago. His smoking use included Cigarettes. He has a 30 pack-year smoking history. He has never used smokeless tobacco. He reports that he does not drink alcohol or use illicit drugs.  Allergies: No Known Allergies  No prescriptions  prior to admission    No results found for this or any previous visit (from the past 48 hour(s)).  No results found.   Pertinent items are noted in HPI.  Height 5\' 9"  (1.753 m), weight 91.627 kg (202 lb).  General appearance: alert, cooperative and appears stated age Head: Normocephalic, without obvious abnormality Neck: no JVD Resp: clear to auscultation bilaterally Cardio: regular rate and rhythm, S1, S2 normal, no murmur, click, rub or gallop GI: soft, non-tender; bowel sounds normal; no masses,  no organomegaly Extremities: numbness right hand catching ring and middle fingers Pulses: 2+ and symmetric Skin: Skin color,  texture, turgor normal. No rashes or lesions Neurologic: Grossly normal Incision/Wound: na  Assessment/Plan DIAGNOSIS:     Carpal tunnel syndrome, right hand.   Stenosing tenosynovitis, right middle, right ring fingers.  RECOMMENDATIONS/PLAN:   The pre, peri and postoperative course were discussed along with the risks and complications.  The patient is aware there is no guarantee with the surgery, possibility of infection, recurrence, injury to arteries, nerves, tendons, incomplete relief of symptoms and dystrophy.  He is informed that this will likely not entirely come back to normal for him.    He is scheduled for carpal tunnel release, release stenosing tenosynovitis right middle and ring fingers as an outpatient under regional anesthesia  Brian Brown R 03/06/2014, 7:45 AM

## 2014-03-06 NOTE — Brief Op Note (Signed)
03/06/2014  11:19 AM  PATIENT:  Brian Brown  81 y.o. male  PRE-OPERATIVE DIAGNOSIS:  CARPAL TUNNEL SYNDROME STENOSING TENOSYNOVITISRIGHT MIDDLE FINGER,STENOSING TENOSYNOVITIS RING FINGER  POST-OPERATIVE DIAGNOSIS:  carpal tunnel syndrome stenosing tenosynovitis middle and ring finger  PROCEDURE:  Procedure(s): RIGHT CARPAL TUNNEL RELEASE (Right) RIGHT MIDDLE FINGER,RIGHT RINGER FINGER (Right)  SURGEON:  Surgeon(s) and Role:    * Daryll Brod, MD - Primary    * Leanora Cover, MD - Assisting  PHYSICIAN ASSISTANT:   ASSISTANTS: K Chaniyah Jahr,MD   ANESTHESIA:   local and general  EBL:  Total I/O In: 1000 [I.V.:1000] Out: -   BLOOD ADMINISTERED:none  DRAINS: none   LOCAL MEDICATIONS USED:  BUPIVICAINE   SPECIMEN:  No Specimen  DISPOSITION OF SPECIMEN:  N/A  COUNTS:  YES  TOURNIQUET:   Total Tourniquet Time Documented: Upper Arm (Right) - 21 minutes Total: Upper Arm (Right) - 21 minutes   DICTATION: .Other Dictation: Dictation Number [id number]  PLAN OF CARE: Discharge to home after PACU  PATIENT DISPOSITION:  PACU - hemodynamically stable.

## 2014-03-06 NOTE — Transfer of Care (Signed)
Immediate Anesthesia Transfer of Care Note  Patient: Brian Brown  Procedure(s) Performed: Procedure(s): RIGHT CARPAL TUNNEL RELEASE (Right) RIGHT MIDDLE FINGER,RIGHT RINGER FINGER (Right)  Patient Location: PACU  Anesthesia Type:General  Level of Consciousness: sedated  Airway & Oxygen Therapy: Patient Spontanous Breathing and Patient connected to face mask oxygen  Post-op Assessment: Report given to RN and Post -op Vital signs reviewed and stable  Post vital signs: Reviewed and stable  Last Vitals:  Filed Vitals:   03/06/14 0852  BP: 147/89  Pulse: 61  Temp: 36.6 C  Resp: 18    Complications: No apparent anesthesia complications

## 2014-03-06 NOTE — Op Note (Signed)
Dictation Number [id number]

## 2014-03-06 NOTE — Anesthesia Preprocedure Evaluation (Signed)
Anesthesia Evaluation  Patient identified by MRN, date of birth, ID band Patient awake    Reviewed: Allergy & Precautions, NPO status , Patient's Chart, lab work & pertinent test results  Airway Mallampati: II   Neck ROM: full    Dental   Pulmonary sleep apnea , former smoker,          Cardiovascular hypertension,     Neuro/Psych CVA, Residual Symptoms    GI/Hepatic PUD, (+) Hepatitis -, C  Endo/Other    Renal/GU      Musculoskeletal  (+) Arthritis -,   Abdominal   Peds  Hematology   Anesthesia Other Findings   Reproductive/Obstetrics                             Anesthesia Physical Anesthesia Plan  ASA: III  Anesthesia Plan: General   Post-op Pain Management:    Induction: Intravenous  Airway Management Planned: LMA  Additional Equipment:   Intra-op Plan:   Post-operative Plan:   Informed Consent: I have reviewed the patients History and Physical, chart, labs and discussed the procedure including the risks, benefits and alternatives for the proposed anesthesia with the patient or authorized representative who has indicated his/her understanding and acceptance.     Plan Discussed with: CRNA, Anesthesiologist and Surgeon  Anesthesia Plan Comments:         Anesthesia Quick Evaluation

## 2014-03-06 NOTE — Anesthesia Procedure Notes (Signed)
Procedure Name: LMA Insertion Date/Time: 03/06/2014 10:39 AM Performed by: Baxter Flattery Pre-anesthesia Checklist: Patient identified, Emergency Drugs available, Suction available and Patient being monitored Patient Re-evaluated:Patient Re-evaluated prior to inductionOxygen Delivery Method: Circle System Utilized Preoxygenation: Pre-oxygenation with 100% oxygen Intubation Type: IV induction Ventilation: Mask ventilation without difficulty LMA: LMA inserted LMA Size: 5.0 Number of attempts: 1 Airway Equipment and Method: Bite block Placement Confirmation: positive ETCO2 and breath sounds checked- equal and bilateral Tube secured with: Tape Dental Injury: Teeth and Oropharynx as per pre-operative assessment

## 2014-03-06 NOTE — Discharge Instructions (Addendum)

## 2014-03-06 NOTE — Anesthesia Postprocedure Evaluation (Signed)
Anesthesia Post Note  Patient: Brian Brown  Procedure(s) Performed: Procedure(s) (LRB): RIGHT CARPAL TUNNEL RELEASE (Right) RIGHT MIDDLE FINGER,RIGHT RINGER FINGER (Right)  Anesthesia type: General  Patient location: PACU  Post pain: Pain level controlled and Adequate analgesia  Post assessment: Post-op Vital signs reviewed, Patient's Cardiovascular Status Stable, Respiratory Function Stable, Patent Airway and Pain level controlled  Last Vitals:  Filed Vitals:   03/06/14 1230  BP: 157/95  Pulse: 55  Temp:   Resp: 11    Post vital signs: Reviewed and stable  Level of consciousness: awake, alert  and oriented  Complications: No apparent anesthesia complications

## 2014-03-07 ENCOUNTER — Encounter (HOSPITAL_BASED_OUTPATIENT_CLINIC_OR_DEPARTMENT_OTHER): Payer: Self-pay | Admitting: Orthopedic Surgery

## 2014-03-07 NOTE — Op Note (Signed)
Brian Brown, COLBORN NO.:  [id number]  MEDICAL RECORD NO.:  [id number]  LOCATION:                                 FACILITY:  PHYSICIAN:  Daryll Brod, M.D.            DATE OF BIRTH:  DATE OF PROCEDURE:  03/06/2014 DATE OF DISCHARGE:                              OPERATIVE REPORT   PREOPERATIVE DIAGNOSES:  Carpal tunnel syndrome, right hand; stenosing tenosynovitis, right middle and right ring finger.  POSTOPERATIVE DIAGNOSES:  Carpal tunnel syndrome, right hand; stenosing tenosynovitis, right middle and right ring finger.  OPERATION:  Release A1 pulley, right middle, right ring; release of median nerve, right wrist.  SURGEON:  Daryll Brod, MD.  ASSISTANT:  Leanora Cover, MD  ANESTHESIA:  General with local infiltration.  ANESTHESIOLOGIST:  Albertha Ghee, MD  HISTORY:  The patient is a 81 year old male with a history of bilateral carpal tunnel syndrome.  Nerve conductions are positive.  He has undergone release on his left.  He is admitted now for release to the right side.  He has trigger of his middle and ring fingers.  These will be released in addition to the carpal tunnel.  Pre, peri, and postoperative course have been discussed along with risks and complications.  He is aware that there is no guarantee with the surgery, possibility of infection, recurrence of injury to arteries, nerves, tendons, incomplete relief of symptoms, and dystrophy.  In the preoperative area, the patient is seen, the extremity marked by both patient and surgeon.  Antibiotic given.  PROCEDURE IN DETAIL:  The patient was brought to the operating room, where general anesthetic was carried out without difficulty under the direction of Dr. Marcie Bal.  He was prepped using ChloraPrep, supine position, right arm free.  A 3-minute dry time was allowed.  Time-out taken confirming the patient and procedure.  An oblique incision was made over the A1 pulley of  the middle finger and  carried down through subcutaneous tissue.  Bleeders were electrocauterized with bipolar. Neurovascular structures identified and protected.  Retractors placed. The A1 pulley was identified.  This was released on its radial aspect. A small incision made centrally in A2.  The 2 tendons were separated with a partial tenosynovectomy performed proximally.  Finger placed through a full range of motion, no further triggering was noted.  A separate incision was then made obliquely over the ring finger, A1 pulley, carried down through subcutaneous tissue.  Bleeders again electrocauterized.  Retractors placed protecting the neurovascular structures.  The A1 pulley was released on its radial aspect.  A small incision made centrally in A2.  The 2 tendons separated.  A partial tenosynovectomy performed proximally and the finger placed through a full range of motion, no further triggering was noted.  The wound was irrigated with saline and closed with interrupted 5-0 nylon sutures.  A separate incision was then made longitudinally in the right palm, carried down through subcutaneous tissue.  Bleeders again electrocauterized.  Palmar fascia was split.  Superficial palmar arch was identified.  The flexor tendon to the ring and little finger identified.  The flexor tendon was used as a guide for placement of  retractors to protect the median nerve radially and the ulnar nerve ulnarly. With a sharp dissection, the distal portion of the carpal retinaculum was incised.  This was carried proximally.  A right angle and Sewall retractor were placed between skin and forearm fascia.  The fascia released for approximately 2 cm proximal to the wrist crease under direct vision.  Canal was explored.  Air compression to the nerve was apparent.  Motor branch entered into muscle.  No further lesions were identified.  The wound was irrigated.  The skin closed with interrupted 5-0 nylon sutures.  Local infiltration with  0.25% bupivacaine without epinephrine was given, approximately 9 mL was used. This was separated to each of the 3 incisions.  A sterile compressive dressing with the fingers free was applied.  On deflation of the tourniquet, all fingers immediately pinked.  He was taken to the recovery room for observation in satisfactory condition.  He will be discharged home to return to the [facility] in 1 week on [facility].          ______________________________ Daryll Brod, M.D.     GK/MEDQ  D:  03/06/2014  T:  03/07/2014  Job:  [id number]

## 2014-04-25 DIAGNOSIS — I6789 Other cerebrovascular disease: Secondary | ICD-10-CM | POA: Diagnosis not present

## 2014-04-25 DIAGNOSIS — R51 Headache: Secondary | ICD-10-CM | POA: Diagnosis not present

## 2014-04-25 DIAGNOSIS — I10 Essential (primary) hypertension: Secondary | ICD-10-CM | POA: Diagnosis not present

## 2014-06-19 DIAGNOSIS — H02831 Dermatochalasis of right upper eyelid: Secondary | ICD-10-CM | POA: Diagnosis not present

## 2014-06-19 DIAGNOSIS — H02834 Dermatochalasis of left upper eyelid: Secondary | ICD-10-CM | POA: Diagnosis not present

## 2014-06-19 DIAGNOSIS — Z961 Presence of intraocular lens: Secondary | ICD-10-CM | POA: Diagnosis not present

## 2014-08-31 DIAGNOSIS — E785 Hyperlipidemia, unspecified: Secondary | ICD-10-CM | POA: Diagnosis not present

## 2014-08-31 DIAGNOSIS — C61 Malignant neoplasm of prostate: Secondary | ICD-10-CM | POA: Diagnosis not present

## 2014-08-31 DIAGNOSIS — I6789 Other cerebrovascular disease: Secondary | ICD-10-CM | POA: Diagnosis not present

## 2014-08-31 DIAGNOSIS — I10 Essential (primary) hypertension: Secondary | ICD-10-CM | POA: Diagnosis not present

## 2014-08-31 DIAGNOSIS — Z1389 Encounter for screening for other disorder: Secondary | ICD-10-CM | POA: Diagnosis not present

## 2014-08-31 DIAGNOSIS — Z0000 Encounter for general adult medical examination without abnormal findings: Secondary | ICD-10-CM | POA: Diagnosis not present

## 2014-08-31 DIAGNOSIS — R194 Change in bowel habit: Secondary | ICD-10-CM | POA: Diagnosis not present

## 2015-03-04 DIAGNOSIS — I10 Essential (primary) hypertension: Secondary | ICD-10-CM | POA: Diagnosis not present

## 2015-03-04 DIAGNOSIS — E7800 Pure hypercholesterolemia, unspecified: Secondary | ICD-10-CM | POA: Diagnosis not present

## 2015-03-04 DIAGNOSIS — I6789 Other cerebrovascular disease: Secondary | ICD-10-CM | POA: Diagnosis not present

## 2015-03-04 DIAGNOSIS — M7989 Other specified soft tissue disorders: Secondary | ICD-10-CM | POA: Diagnosis not present

## 2015-03-05 DIAGNOSIS — M7989 Other specified soft tissue disorders: Secondary | ICD-10-CM | POA: Diagnosis not present

## 2015-03-05 DIAGNOSIS — M79604 Pain in right leg: Secondary | ICD-10-CM | POA: Diagnosis not present

## 2015-06-04 ENCOUNTER — Other Ambulatory Visit: Payer: Self-pay | Admitting: Nurse Practitioner

## 2015-06-04 ENCOUNTER — Ambulatory Visit
Admission: RE | Admit: 2015-06-04 | Discharge: 2015-06-04 | Disposition: A | Discharge status: Home / Self Care | Payer: Medicare Other | Source: Ambulatory Visit | Attending: Nurse Practitioner | Admitting: Nurse Practitioner

## 2015-06-04 DIAGNOSIS — M79605 Pain in left leg: Secondary | ICD-10-CM

## 2015-06-04 DIAGNOSIS — M79662 Pain in left lower leg: Secondary | ICD-10-CM | POA: Diagnosis not present

## 2015-09-06 DIAGNOSIS — Z23 Encounter for immunization: Secondary | ICD-10-CM | POA: Diagnosis not present

## 2015-09-06 DIAGNOSIS — I6789 Other cerebrovascular disease: Secondary | ICD-10-CM | POA: Diagnosis not present

## 2015-09-06 DIAGNOSIS — I10 Essential (primary) hypertension: Secondary | ICD-10-CM | POA: Diagnosis not present

## 2015-09-06 DIAGNOSIS — B029 Zoster without complications: Secondary | ICD-10-CM | POA: Diagnosis not present

## 2015-09-06 DIAGNOSIS — Z0000 Encounter for general adult medical examination without abnormal findings: Secondary | ICD-10-CM | POA: Diagnosis not present

## 2015-09-06 DIAGNOSIS — C61 Malignant neoplasm of prostate: Secondary | ICD-10-CM | POA: Diagnosis not present

## 2015-09-06 DIAGNOSIS — Z1389 Encounter for screening for other disorder: Secondary | ICD-10-CM | POA: Diagnosis not present

## 2015-09-06 DIAGNOSIS — E7800 Pure hypercholesterolemia, unspecified: Secondary | ICD-10-CM | POA: Diagnosis not present

## 2015-10-18 DIAGNOSIS — R21 Rash and other nonspecific skin eruption: Secondary | ICD-10-CM | POA: Diagnosis not present

## 2015-12-16 ENCOUNTER — Ambulatory Visit
Admission: RE | Admit: 2015-12-16 | Discharge: 2015-12-16 | Disposition: A | Discharge status: Home / Self Care | Payer: Medicare Other | Source: Ambulatory Visit | Attending: Internal Medicine | Admitting: Internal Medicine

## 2015-12-16 ENCOUNTER — Other Ambulatory Visit: Payer: Self-pay | Admitting: Internal Medicine

## 2015-12-16 DIAGNOSIS — R059 Cough, unspecified: Secondary | ICD-10-CM

## 2015-12-16 DIAGNOSIS — R600 Localized edema: Secondary | ICD-10-CM | POA: Diagnosis not present

## 2015-12-16 DIAGNOSIS — I509 Heart failure, unspecified: Secondary | ICD-10-CM | POA: Diagnosis not present

## 2015-12-16 DIAGNOSIS — R05 Cough: Secondary | ICD-10-CM

## 2016-03-05 ENCOUNTER — Ambulatory Visit
Admission: RE | Admit: 2016-03-05 | Discharge: 2016-03-05 | Disposition: A | Discharge status: Home / Self Care | Payer: Medicare Other | Source: Ambulatory Visit | Attending: Internal Medicine | Admitting: Internal Medicine

## 2016-03-05 ENCOUNTER — Other Ambulatory Visit: Payer: Self-pay | Admitting: Internal Medicine

## 2016-03-05 DIAGNOSIS — R9389 Abnormal findings on diagnostic imaging of other specified body structures: Secondary | ICD-10-CM

## 2016-03-05 DIAGNOSIS — R600 Localized edema: Secondary | ICD-10-CM | POA: Diagnosis not present

## 2016-03-05 DIAGNOSIS — R938 Abnormal findings on diagnostic imaging of other specified body structures: Secondary | ICD-10-CM | POA: Diagnosis not present

## 2016-03-05 DIAGNOSIS — I6789 Other cerebrovascular disease: Secondary | ICD-10-CM | POA: Diagnosis not present

## 2016-03-05 DIAGNOSIS — I10 Essential (primary) hypertension: Secondary | ICD-10-CM | POA: Diagnosis not present

## 2016-03-05 DIAGNOSIS — E7800 Pure hypercholesterolemia, unspecified: Secondary | ICD-10-CM | POA: Diagnosis not present

## 2016-03-05 DIAGNOSIS — R0789 Other chest pain: Secondary | ICD-10-CM | POA: Diagnosis not present

## 2016-09-14 DIAGNOSIS — R600 Localized edema: Secondary | ICD-10-CM | POA: Diagnosis not present

## 2016-09-14 DIAGNOSIS — E7800 Pure hypercholesterolemia, unspecified: Secondary | ICD-10-CM | POA: Diagnosis not present

## 2016-09-14 DIAGNOSIS — Z7189 Other specified counseling: Secondary | ICD-10-CM | POA: Diagnosis not present

## 2016-09-14 DIAGNOSIS — I10 Essential (primary) hypertension: Secondary | ICD-10-CM | POA: Diagnosis not present

## 2016-09-14 DIAGNOSIS — Z0000 Encounter for general adult medical examination without abnormal findings: Secondary | ICD-10-CM | POA: Diagnosis not present

## 2016-09-14 DIAGNOSIS — Z1389 Encounter for screening for other disorder: Secondary | ICD-10-CM | POA: Diagnosis not present

## 2016-09-14 DIAGNOSIS — I6789 Other cerebrovascular disease: Secondary | ICD-10-CM | POA: Diagnosis not present

## 2016-09-29 DIAGNOSIS — I6789 Other cerebrovascular disease: Secondary | ICD-10-CM | POA: Diagnosis not present

## 2016-09-29 DIAGNOSIS — M1990 Unspecified osteoarthritis, unspecified site: Secondary | ICD-10-CM | POA: Diagnosis not present

## 2016-09-29 DIAGNOSIS — Z8546 Personal history of malignant neoplasm of prostate: Secondary | ICD-10-CM | POA: Diagnosis not present

## 2016-09-29 DIAGNOSIS — E785 Hyperlipidemia, unspecified: Secondary | ICD-10-CM | POA: Diagnosis not present

## 2016-09-29 DIAGNOSIS — I509 Heart failure, unspecified: Secondary | ICD-10-CM | POA: Diagnosis not present

## 2016-09-29 DIAGNOSIS — I10 Essential (primary) hypertension: Secondary | ICD-10-CM | POA: Diagnosis not present

## 2016-10-16 DIAGNOSIS — H26493 Other secondary cataract, bilateral: Secondary | ICD-10-CM | POA: Diagnosis not present

## 2016-10-16 DIAGNOSIS — Z961 Presence of intraocular lens: Secondary | ICD-10-CM | POA: Diagnosis not present

## 2016-10-16 DIAGNOSIS — H04123 Dry eye syndrome of bilateral lacrimal glands: Secondary | ICD-10-CM | POA: Diagnosis not present

## 2016-11-11 DIAGNOSIS — Z23 Encounter for immunization: Secondary | ICD-10-CM | POA: Diagnosis not present

## 2017-01-15 ENCOUNTER — Ambulatory Visit
Admission: RE | Admit: 2017-01-15 | Discharge: 2017-01-15 | Disposition: A | Discharge status: Home / Self Care | Payer: Medicare Other | Source: Ambulatory Visit | Attending: Internal Medicine | Admitting: Internal Medicine

## 2017-01-15 ENCOUNTER — Other Ambulatory Visit: Payer: Self-pay | Admitting: Internal Medicine

## 2017-01-15 DIAGNOSIS — M545 Low back pain, unspecified: Secondary | ICD-10-CM

## 2017-01-15 DIAGNOSIS — M47816 Spondylosis without myelopathy or radiculopathy, lumbar region: Secondary | ICD-10-CM | POA: Diagnosis not present

## 2017-01-22 ENCOUNTER — Ambulatory Visit (INDEPENDENT_AMBULATORY_CARE_PROVIDER_SITE_OTHER): Payer: Self-pay | Admitting: Orthopaedic Surgery

## 2017-03-26 DIAGNOSIS — R600 Localized edema: Secondary | ICD-10-CM | POA: Diagnosis not present

## 2017-03-26 DIAGNOSIS — Z8546 Personal history of malignant neoplasm of prostate: Secondary | ICD-10-CM | POA: Diagnosis not present

## 2017-03-26 DIAGNOSIS — Z8673 Personal history of transient ischemic attack (TIA), and cerebral infarction without residual deficits: Secondary | ICD-10-CM | POA: Diagnosis not present

## 2017-03-26 DIAGNOSIS — E7800 Pure hypercholesterolemia, unspecified: Secondary | ICD-10-CM | POA: Diagnosis not present

## 2017-03-26 DIAGNOSIS — I10 Essential (primary) hypertension: Secondary | ICD-10-CM | POA: Diagnosis not present

## 2017-08-26 ENCOUNTER — Ambulatory Visit
Admission: RE | Admit: 2017-08-26 | Discharge: 2017-08-26 | Disposition: A | Discharge status: Home / Self Care | Payer: Medicare Other | Source: Ambulatory Visit | Attending: Internal Medicine | Admitting: Internal Medicine

## 2017-08-26 ENCOUNTER — Other Ambulatory Visit: Payer: Self-pay | Admitting: Internal Medicine

## 2017-08-26 DIAGNOSIS — M1712 Unilateral primary osteoarthritis, left knee: Secondary | ICD-10-CM | POA: Diagnosis not present

## 2017-08-26 DIAGNOSIS — M25562 Pain in left knee: Secondary | ICD-10-CM

## 2017-09-28 DIAGNOSIS — E7800 Pure hypercholesterolemia, unspecified: Secondary | ICD-10-CM | POA: Diagnosis not present

## 2017-09-28 DIAGNOSIS — Z8673 Personal history of transient ischemic attack (TIA), and cerebral infarction without residual deficits: Secondary | ICD-10-CM | POA: Diagnosis not present

## 2017-09-28 DIAGNOSIS — I10 Essential (primary) hypertension: Secondary | ICD-10-CM | POA: Diagnosis not present

## 2017-09-28 DIAGNOSIS — Z1389 Encounter for screening for other disorder: Secondary | ICD-10-CM | POA: Diagnosis not present

## 2017-09-28 DIAGNOSIS — R05 Cough: Secondary | ICD-10-CM | POA: Diagnosis not present

## 2017-09-28 DIAGNOSIS — R600 Localized edema: Secondary | ICD-10-CM | POA: Diagnosis not present

## 2017-09-28 DIAGNOSIS — Z0000 Encounter for general adult medical examination without abnormal findings: Secondary | ICD-10-CM | POA: Diagnosis not present

## 2017-09-28 DIAGNOSIS — Z7189 Other specified counseling: Secondary | ICD-10-CM | POA: Diagnosis not present

## 2017-10-04 DIAGNOSIS — Z8673 Personal history of transient ischemic attack (TIA), and cerebral infarction without residual deficits: Secondary | ICD-10-CM | POA: Diagnosis not present

## 2017-10-04 DIAGNOSIS — E7800 Pure hypercholesterolemia, unspecified: Secondary | ICD-10-CM | POA: Diagnosis not present

## 2017-10-04 DIAGNOSIS — Z23 Encounter for immunization: Secondary | ICD-10-CM | POA: Diagnosis not present

## 2017-12-17 DIAGNOSIS — I10 Essential (primary) hypertension: Secondary | ICD-10-CM | POA: Diagnosis not present

## 2017-12-17 DIAGNOSIS — I6789 Other cerebrovascular disease: Secondary | ICD-10-CM | POA: Diagnosis not present

## 2017-12-17 DIAGNOSIS — I509 Heart failure, unspecified: Secondary | ICD-10-CM | POA: Diagnosis not present

## 2017-12-17 DIAGNOSIS — E785 Hyperlipidemia, unspecified: Secondary | ICD-10-CM | POA: Diagnosis not present

## 2017-12-17 DIAGNOSIS — M1990 Unspecified osteoarthritis, unspecified site: Secondary | ICD-10-CM | POA: Diagnosis not present

## 2017-12-17 DIAGNOSIS — Z8546 Personal history of malignant neoplasm of prostate: Secondary | ICD-10-CM | POA: Diagnosis not present

## 2017-12-20 DIAGNOSIS — Z8673 Personal history of transient ischemic attack (TIA), and cerebral infarction without residual deficits: Secondary | ICD-10-CM | POA: Diagnosis not present

## 2017-12-20 DIAGNOSIS — Z9181 History of falling: Secondary | ICD-10-CM | POA: Diagnosis not present

## 2018-01-11 DIAGNOSIS — R2681 Unsteadiness on feet: Secondary | ICD-10-CM | POA: Diagnosis not present

## 2018-01-11 DIAGNOSIS — M545 Low back pain: Secondary | ICD-10-CM | POA: Diagnosis not present

## 2018-01-11 DIAGNOSIS — M6281 Muscle weakness (generalized): Secondary | ICD-10-CM | POA: Diagnosis not present

## 2018-01-11 DIAGNOSIS — R2689 Other abnormalities of gait and mobility: Secondary | ICD-10-CM | POA: Diagnosis not present

## 2018-01-17 DIAGNOSIS — R2681 Unsteadiness on feet: Secondary | ICD-10-CM | POA: Diagnosis not present

## 2018-01-17 DIAGNOSIS — M545 Low back pain: Secondary | ICD-10-CM | POA: Diagnosis not present

## 2018-01-17 DIAGNOSIS — R2689 Other abnormalities of gait and mobility: Secondary | ICD-10-CM | POA: Diagnosis not present

## 2018-01-17 DIAGNOSIS — M6281 Muscle weakness (generalized): Secondary | ICD-10-CM | POA: Diagnosis not present

## 2018-01-19 DIAGNOSIS — M6281 Muscle weakness (generalized): Secondary | ICD-10-CM | POA: Diagnosis not present

## 2018-01-19 DIAGNOSIS — R2681 Unsteadiness on feet: Secondary | ICD-10-CM | POA: Diagnosis not present

## 2018-01-19 DIAGNOSIS — R2689 Other abnormalities of gait and mobility: Secondary | ICD-10-CM | POA: Diagnosis not present

## 2018-01-19 DIAGNOSIS — M545 Low back pain: Secondary | ICD-10-CM | POA: Diagnosis not present

## 2018-01-24 DIAGNOSIS — R2681 Unsteadiness on feet: Secondary | ICD-10-CM | POA: Diagnosis not present

## 2018-01-24 DIAGNOSIS — M6281 Muscle weakness (generalized): Secondary | ICD-10-CM | POA: Diagnosis not present

## 2018-01-24 DIAGNOSIS — M545 Low back pain: Secondary | ICD-10-CM | POA: Diagnosis not present

## 2018-01-24 DIAGNOSIS — R2689 Other abnormalities of gait and mobility: Secondary | ICD-10-CM | POA: Diagnosis not present

## 2018-01-26 DIAGNOSIS — M545 Low back pain: Secondary | ICD-10-CM | POA: Diagnosis not present

## 2018-01-26 DIAGNOSIS — R2681 Unsteadiness on feet: Secondary | ICD-10-CM | POA: Diagnosis not present

## 2018-01-26 DIAGNOSIS — R2689 Other abnormalities of gait and mobility: Secondary | ICD-10-CM | POA: Diagnosis not present

## 2018-01-26 DIAGNOSIS — M6281 Muscle weakness (generalized): Secondary | ICD-10-CM | POA: Diagnosis not present

## 2018-01-31 DIAGNOSIS — R2689 Other abnormalities of gait and mobility: Secondary | ICD-10-CM | POA: Diagnosis not present

## 2018-01-31 DIAGNOSIS — M6281 Muscle weakness (generalized): Secondary | ICD-10-CM | POA: Diagnosis not present

## 2018-01-31 DIAGNOSIS — M545 Low back pain: Secondary | ICD-10-CM | POA: Diagnosis not present

## 2018-01-31 DIAGNOSIS — R2681 Unsteadiness on feet: Secondary | ICD-10-CM | POA: Diagnosis not present

## 2018-02-02 DIAGNOSIS — R2681 Unsteadiness on feet: Secondary | ICD-10-CM | POA: Diagnosis not present

## 2018-02-02 DIAGNOSIS — M6281 Muscle weakness (generalized): Secondary | ICD-10-CM | POA: Diagnosis not present

## 2018-02-02 DIAGNOSIS — M545 Low back pain: Secondary | ICD-10-CM | POA: Diagnosis not present

## 2018-02-02 DIAGNOSIS — R2689 Other abnormalities of gait and mobility: Secondary | ICD-10-CM | POA: Diagnosis not present

## 2018-02-07 DIAGNOSIS — M545 Low back pain: Secondary | ICD-10-CM | POA: Diagnosis not present

## 2018-02-07 DIAGNOSIS — M6281 Muscle weakness (generalized): Secondary | ICD-10-CM | POA: Diagnosis not present

## 2018-02-07 DIAGNOSIS — R2681 Unsteadiness on feet: Secondary | ICD-10-CM | POA: Diagnosis not present

## 2018-02-07 DIAGNOSIS — R2689 Other abnormalities of gait and mobility: Secondary | ICD-10-CM | POA: Diagnosis not present

## 2018-02-13 ENCOUNTER — Observation Stay (HOSPITAL_COMMUNITY): Payer: Medicare Other

## 2018-02-13 ENCOUNTER — Encounter (HOSPITAL_COMMUNITY): Payer: Self-pay | Admitting: Emergency Medicine

## 2018-02-13 ENCOUNTER — Emergency Department (HOSPITAL_COMMUNITY): Payer: Medicare Other

## 2018-02-13 ENCOUNTER — Inpatient Hospital Stay (HOSPITAL_COMMUNITY)
Admission: EM | Admit: 2018-02-13 | Discharge: 2018-02-15 | DRG: 065 | Disposition: A | Discharge status: Home / Self Care | Payer: Medicare Other | Attending: Internal Medicine | Admitting: Internal Medicine

## 2018-02-13 DIAGNOSIS — G4733 Obstructive sleep apnea (adult) (pediatric): Secondary | ICD-10-CM | POA: Diagnosis not present

## 2018-02-13 DIAGNOSIS — Z8711 Personal history of peptic ulcer disease: Secondary | ICD-10-CM

## 2018-02-13 DIAGNOSIS — I639 Cerebral infarction, unspecified: Secondary | ICD-10-CM | POA: Diagnosis not present

## 2018-02-13 DIAGNOSIS — R29702 NIHSS score 2: Secondary | ICD-10-CM | POA: Diagnosis not present

## 2018-02-13 DIAGNOSIS — M1990 Unspecified osteoarthritis, unspecified site: Secondary | ICD-10-CM | POA: Diagnosis present

## 2018-02-13 DIAGNOSIS — I63431 Cerebral infarction due to embolism of right posterior cerebral artery: Secondary | ICD-10-CM | POA: Diagnosis not present

## 2018-02-13 DIAGNOSIS — H5462 Unqualified visual loss, left eye, normal vision right eye: Secondary | ICD-10-CM | POA: Diagnosis not present

## 2018-02-13 DIAGNOSIS — Z7902 Long term (current) use of antithrombotics/antiplatelets: Secondary | ICD-10-CM

## 2018-02-13 DIAGNOSIS — G4730 Sleep apnea, unspecified: Secondary | ICD-10-CM | POA: Diagnosis present

## 2018-02-13 DIAGNOSIS — G459 Transient cerebral ischemic attack, unspecified: Secondary | ICD-10-CM | POA: Diagnosis present

## 2018-02-13 DIAGNOSIS — R251 Tremor, unspecified: Secondary | ICD-10-CM | POA: Diagnosis present

## 2018-02-13 DIAGNOSIS — Z79899 Other long term (current) drug therapy: Secondary | ICD-10-CM

## 2018-02-13 DIAGNOSIS — I69351 Hemiplegia and hemiparesis following cerebral infarction affecting right dominant side: Secondary | ICD-10-CM | POA: Diagnosis not present

## 2018-02-13 DIAGNOSIS — I10 Essential (primary) hypertension: Secondary | ICD-10-CM | POA: Diagnosis not present

## 2018-02-13 DIAGNOSIS — Z96643 Presence of artificial hip joint, bilateral: Secondary | ICD-10-CM | POA: Diagnosis present

## 2018-02-13 DIAGNOSIS — I63511 Cerebral infarction due to unspecified occlusion or stenosis of right middle cerebral artery: Secondary | ICD-10-CM | POA: Diagnosis not present

## 2018-02-13 DIAGNOSIS — H53462 Homonymous bilateral field defects, left side: Secondary | ICD-10-CM | POA: Diagnosis not present

## 2018-02-13 DIAGNOSIS — I63432 Cerebral infarction due to embolism of left posterior cerebral artery: Secondary | ICD-10-CM

## 2018-02-13 DIAGNOSIS — Z972 Presence of dental prosthetic device (complete) (partial): Secondary | ICD-10-CM

## 2018-02-13 DIAGNOSIS — Z87891 Personal history of nicotine dependence: Secondary | ICD-10-CM

## 2018-02-13 DIAGNOSIS — R9082 White matter disease, unspecified: Secondary | ICD-10-CM | POA: Diagnosis not present

## 2018-02-13 DIAGNOSIS — Z8546 Personal history of malignant neoplasm of prostate: Secondary | ICD-10-CM

## 2018-02-13 DIAGNOSIS — Z9079 Acquired absence of other genital organ(s): Secondary | ICD-10-CM

## 2018-02-13 LAB — PROTIME-INR
INR: 1.02
Prothrombin Time: 13.3 seconds (ref 11.4–15.2)

## 2018-02-13 LAB — RAPID URINE DRUG SCREEN, HOSP PERFORMED
Amphetamines: NOT DETECTED
Barbiturates: NOT DETECTED
Benzodiazepines: NOT DETECTED
Cocaine: NOT DETECTED
Opiates: NOT DETECTED
Tetrahydrocannabinol: NOT DETECTED

## 2018-02-13 LAB — DIFFERENTIAL
Abs Immature Granulocytes: 0.01 10*3/uL (ref 0.00–0.07)
Basophils Absolute: 0.1 10*3/uL (ref 0.0–0.1)
Basophils Relative: 1 %
Eosinophils Absolute: 0.2 10*3/uL (ref 0.0–0.5)
Eosinophils Relative: 2 %
Immature Granulocytes: 0 %
Lymphocytes Relative: 22 %
Lymphs Abs: 1.4 10*3/uL (ref 0.7–4.0)
Monocytes Absolute: 0.5 10*3/uL (ref 0.1–1.0)
Monocytes Relative: 8 %
Neutro Abs: 4.1 10*3/uL (ref 1.7–7.7)
Neutrophils Relative %: 67 %

## 2018-02-13 LAB — URINALYSIS, ROUTINE W REFLEX MICROSCOPIC
Bacteria, UA: NONE SEEN
Bilirubin Urine: NEGATIVE
Glucose, UA: NEGATIVE mg/dL
Ketones, ur: NEGATIVE mg/dL
Leukocytes, UA: NEGATIVE
Nitrite: NEGATIVE
Protein, ur: NEGATIVE mg/dL
Specific Gravity, Urine: 1.012 (ref 1.005–1.030)
pH: 7 (ref 5.0–8.0)

## 2018-02-13 LAB — APTT: aPTT: 29 seconds (ref 24–36)

## 2018-02-13 LAB — CBG MONITORING, ED: Glucose-Capillary: 92 mg/dL (ref 70–99)

## 2018-02-13 LAB — COMPREHENSIVE METABOLIC PANEL
ALT: 15 U/L (ref 0–44)
AST: 13 U/L — ABNORMAL LOW (ref 15–41)
Albumin: 3.2 g/dL — ABNORMAL LOW (ref 3.5–5.0)
Alkaline Phosphatase: 64 U/L (ref 38–126)
Anion gap: 8 (ref 5–15)
BUN: 12 mg/dL (ref 8–23)
CO2: 27 mmol/L (ref 22–32)
Calcium: 9 mg/dL (ref 8.9–10.3)
Chloride: 105 mmol/L (ref 98–111)
Creatinine, Ser: 1.28 mg/dL — ABNORMAL HIGH (ref 0.61–1.24)
GFR calc Af Amer: 60 mL/min — ABNORMAL LOW (ref >60)
GFR calc non Af Amer: 51 mL/min — ABNORMAL LOW (ref >60)
Glucose, Bld: 95 mg/dL (ref 70–99)
Potassium: 3.4 mmol/L — ABNORMAL LOW (ref 3.5–5.1)
Sodium: 140 mmol/L (ref 135–145)
Total Bilirubin: 0.8 mg/dL (ref 0.3–1.2)
Total Protein: 6.6 g/dL (ref 6.5–8.1)

## 2018-02-13 LAB — CBC
HCT: 40.2 % (ref 39.0–52.0)
Hemoglobin: 12.7 g/dL — ABNORMAL LOW (ref 13.0–17.0)
MCH: 28.2 pg (ref 26.0–34.0)
MCHC: 31.6 g/dL (ref 30.0–36.0)
MCV: 89.3 fL (ref 80.0–100.0)
Platelets: 201 10*3/uL (ref 150–400)
RBC: 4.5 MIL/uL (ref 4.22–5.81)
RDW: 13.3 % (ref 11.5–15.5)
WBC: 6.2 10*3/uL (ref 4.0–10.5)
nRBC: 0 % (ref 0.0–0.2)

## 2018-02-13 LAB — ETHANOL: Alcohol, Ethyl (B): <10 mg/dL (ref <10)

## 2018-02-13 MED ORDER — GADOBUTROL 1 MMOL/ML IV SOLN
9.0000 mL | Freq: Once | INTRAVENOUS | Status: AC | PRN
  Administered 2018-02-13: 9 mL via INTRAVENOUS

## 2018-02-13 MED ORDER — CLOPIDOGREL BISULFATE 75 MG PO TABS: 75.0000 mg | ORAL_TABLET | Freq: Every day | ORAL | Status: DC

## 2018-02-13 MED ORDER — DILTIAZEM HCL ER COATED BEADS 240 MG PO CP24
240.0000 mg | ORAL_CAPSULE | Freq: Every day | ORAL | Status: DC
  Administered 2018-02-13 – 2018-02-15 (×3): 240 mg via ORAL
  Filled 2018-02-13 (×7): qty 1

## 2018-02-13 MED ORDER — ASPIRIN 325 MG PO TABS
325.0000 mg | ORAL_TABLET | Freq: Every day | ORAL | Status: DC
  Administered 2018-02-13: 325 mg via ORAL
  Filled 2018-02-13: qty 1

## 2018-02-13 MED ORDER — ROSUVASTATIN CALCIUM 5 MG PO TABS
10.0000 mg | ORAL_TABLET | Freq: Every evening | ORAL | Status: DC
  Administered 2018-02-13 – 2018-02-14 (×2): 10 mg via ORAL
  Filled 2018-02-13 (×2): qty 2

## 2018-02-13 MED ORDER — FAMOTIDINE 20 MG PO TABS
20.0000 mg | ORAL_TABLET | Freq: Two times a day (BID) | ORAL | Status: DC
  Administered 2018-02-13 – 2018-02-15 (×5): 20 mg via ORAL
  Filled 2018-02-13 (×5): qty 1

## 2018-02-13 MED ORDER — CLOPIDOGREL BISULFATE 75 MG PO TABS
75.0000 mg | ORAL_TABLET | Freq: Every day | ORAL | Status: DC
  Administered 2018-02-13 – 2018-02-15 (×3): 75 mg via ORAL
  Filled 2018-02-13 (×3): qty 1

## 2018-02-13 MED ORDER — ENOXAPARIN SODIUM 40 MG/0.4ML ~~LOC~~ SOLN
40.0000 mg | SUBCUTANEOUS | Status: DC
  Administered 2018-02-13 – 2018-02-14 (×2): 40 mg via SUBCUTANEOUS
  Filled 2018-02-13 (×2): qty 0.4

## 2018-02-13 MED ORDER — METOPROLOL TARTRATE 25 MG PO TABS
25.0000 mg | ORAL_TABLET | Freq: Two times a day (BID) | ORAL | Status: DC
  Administered 2018-02-13 – 2018-02-15 (×3): 25 mg via ORAL
  Filled 2018-02-13 (×5): qty 1

## 2018-02-13 MED ORDER — PANTOPRAZOLE SODIUM 40 MG PO TBEC: 40.0000 mg | DELAYED_RELEASE_TABLET | ORAL | Status: DC

## 2018-02-13 MED ORDER — LORAZEPAM 2 MG/ML IJ SOLN
1.0000 mg | Freq: Once | INTRAMUSCULAR | Status: AC
  Administered 2018-02-13: 1 mg via INTRAVENOUS
  Filled 2018-02-13: qty 1

## 2018-02-13 MED ORDER — STROKE: EARLY STAGES OF RECOVERY BOOK
Freq: Once | Status: AC
  Administered 2018-02-13: 13:00:00
  Filled 2018-02-13: qty 1

## 2018-02-13 NOTE — ED Notes (Signed)
Pt returns from CT.

## 2018-02-13 NOTE — ED Notes (Signed)
Patient transported to CT 

## 2018-02-13 NOTE — ED Notes (Signed)
Pt back from scan 

## 2018-02-13 NOTE — H&P (Signed)
History and Physical    Brian Brown JEH:[id number] DOB: 1932-01-18 DOA: 02/13/2018  PCP: Seward Carol, MD  Patient coming from: Home.  I have personally briefly reviewed patient's old medical records available.   Chief Complaint: Left eye vision loss.  HPI: Brian Brown is a 85 y.o. male with medical history significant of history of a stroke with right hemiparesis, prostate cancer, sleep apnea currently not wearing CPAP, hypertension who presents to the emergency room with sudden onset of loss of vision on the left side.  According to the patient, he had a stroke in 2010 and since had some weakness on the right leg, however he has been functioning normally.  He occasionally uses cane otherwise independent.  He does use Plavix.Patient stated that he went to bed last night with his normal routine, he woke up today morning and he cannot see anything from his left eye on the outer aspect.  He denies any other symptoms including headache, nausea, vomiting, weakness, speech difficulties.  He has no new weakness on his extremities.  Denies any fever cough shortness of breath flulike symptoms.  Denies any pain in his eyes. ED Course: Hemodynamically stable in the ER.  Creatinine 1.28.  CT scan of the head is essentially normal.  He was found to have left hemianopsia.  No other focal neurological deficits.  Not a TPA candidate because of minimal symptoms.  Code stroke was called and patient was in no need of acute intervention including TPA and clot retrieval.  Given aspirin in the ER.  Review of Systems: As per HPI otherwise 10 point review of systems negative.    Past Medical History:  Diagnosis Date  . Arthritis    degenerative joint disease  . Bleeding stomach ulcer 25 yrs ago  . Cancer [facility]) 2002   prostate, s/p surgery  . Hepatitis 1981   does not know what kind;denies jaundice  . Hypertension    treated by Dr Delfina Redwood, primary MD  . Sleep apnea 2005   does not wear a Cpap  .  Stroke ([place]) 2003   w/ Rt sided weakness-some-rt leg  . Wears dentures    full top  . Wears glasses     Past Surgical History:  Procedure Laterality Date  . CARPAL TUNNEL RELEASE Left 03/29/2013   Procedure: LEFT CARPAL TUNNEL RELEASE;  Surgeon: Wynonia Sours, MD;  Location: [place];  Service: Orthopedics;  Laterality: Left;  ANESTHESIA: IV REGIONAL FAB  . CARPAL TUNNEL RELEASE Right 03/06/2014   Procedure: RIGHT CARPAL TUNNEL RELEASE;  Surgeon: Daryll Brod, MD;  Location: [place];  Service: Orthopedics;  Laterality: Right;  . COLONOSCOPY    . HERNIA REPAIR  2009   rt inguinal hernia  . JOINT REPLACEMENT  12/2001   rt hip ;[place]  2002  . SHOULDER ARTHROSCOPY W/ ROTATOR CUFF REPAIR  2014   lt-dr whitfield  . TOTAL HIP ARTHROPLASTY  02/03/2011   Procedure: TOTAL HIP ARTHROPLASTY;  Surgeon: Garald Balding, MD;  Location: [place];  Service: Orthopedics;  Laterality: Left;  . TRIGGER FINGER RELEASE Left 03/29/2013   Procedure: LEFT MIDDLE/SMALL FINGER RELEASE A-1 PULLEY AND LEFT RING FINGER;  Surgeon: Wynonia Sours, MD;  Location: [place];  Service: Orthopedics;  Laterality: Left;  . TRIGGER FINGER RELEASE Right 03/06/2014   Procedure: RIGHT MIDDLE FINGER,RIGHT RINGER FINGER;  Surgeon: Daryll Brod, MD;  Location: [place];  Service: Orthopedics;  Laterality: Right;     reports that he quit smoking about 10 years ago. His smoking use included cigarettes. He has a 30.00 pack-year smoking history. He has never used smokeless tobacco. He reports that he does not drink alcohol or use drugs.  No Known Allergies  History reviewed. No pertinent family history.   Prior to Admission medications   Medication Sig Start Date End Date Taking? Authorizing Provider  clopidogrel (PLAVIX) 75 MG tablet Take 75 mg by mouth daily with breakfast.   Yes [provider]  diltiazem (DILACOR XR) 240 MG 24 hr  capsule Take 240 mg by mouth daily.   Yes [provider]  metoprolol tartrate (LOPRESSOR) 25 MG tablet Take 25 mg by mouth 2 (two) times daily.   Yes [provider]  pantoprazole (PROTONIX) 40 MG tablet Take 40 mg by mouth every morning. 02/02/18  Yes [provider]  rosuvastatin (CRESTOR) 10 MG tablet Take 10 mg by mouth every evening. 12/18/17  Yes [provider]    Physical Exam: Vitals:   02/13/18 1115 02/13/18 1130 02/13/18 1147 02/13/18 1235  BP: (!) 155/91  (!) 121/95 (!) 156/89  Pulse: (!) 101 63 67   Resp: 20 17 20    Temp:      TempSrc:      SpO2: 97% 100% 100%     Constitutional: NAD, calm, comfortable Vitals:   02/13/18 1115 02/13/18 1130 02/13/18 1147 02/13/18 1235  BP: (!) 155/91  (!) 121/95 (!) 156/89  Pulse: (!) 101 63 67   Resp: 20 17 20    Temp:      TempSrc:      SpO2: 97% 100% 100%    Eyes: PERRL, lids and conjunctivae normal ENMT: Mucous membranes are moist. Posterior pharynx clear of any exudate or lesions.Normal dentition.  Neck: normal, supple, no masses, no thyromegaly Respiratory: clear to auscultation bilaterally, no wheezing, no crackles. Normal respiratory effort. No accessory muscle use.  Cardiovascular: Regular rate and rhythm, no murmurs / rubs / gallops. No extremity edema. 2+ pedal pulses. No carotid bruits.  Abdomen: no tenderness, no masses palpated. No hepatosplenomegaly. Bowel sounds positive.  Musculoskeletal: no clubbing / cyanosis. No joint deformity upper and lower extremities. Good ROM, no contractures. Normal muscle tone.  Skin: no rashes, lesions, ulcers. No induration. Neurologic:  Left hemianopsia with peripheral vision loss. Other cranial nerves are grossly intact. Sensation intact, DTR normal. Strength 5/5 in all 4.  Psychiatric: Normal judgment and insight. Alert and oriented x 3. Normal mood.     Labs on Admission: I have personally reviewed following labs and imaging  studies  CBC: Recent Labs  Lab 02/13/18 0815  WBC 6.2  NEUTROABS 4.1  HGB 12.7*  HCT 40.2  MCV 89.3  PLT 742   Basic Metabolic Panel: Recent Labs  Lab 02/13/18 0815  NA 140  K 3.4*  CL 105  CO2 27  GLUCOSE 95  BUN 12  CREATININE 1.28*  CALCIUM 9.0   GFR: CrCl cannot be calculated (Unknown ideal weight.). Liver Function Tests: Recent Labs  Lab 02/13/18 0815  AST 13*  ALT 15  ALKPHOS 64  BILITOT 0.8  PROT 6.6  ALBUMIN 3.2*   No results for input(s): LIPASE, AMYLASE in the last 168 hours. No results for input(s): AMMONIA in the last 168 hours. Coagulation Profile: Recent Labs  Lab 02/13/18 0815  INR 1.02   Cardiac Enzymes: No results for input(s): CKTOTAL, CKMB, CKMBINDEX, TROPONINI in the last 168 hours. BNP (last 3  results) No results for input(s): PROBNP in the last 8760 hours. HbA1C: No results for input(s): HGBA1C in the last 72 hours. CBG: Recent Labs  Lab 02/13/18 0752  GLUCAP 92   Lipid Profile: No results for input(s): CHOL, HDL, LDLCALC, TRIG, CHOLHDL, LDLDIRECT in the last 72 hours. Thyroid Function Tests: No results for input(s): TSH, T4TOTAL, FREET4, T3FREE, THYROIDAB in the last 72 hours. Anemia Panel: No results for input(s): VITAMINB12, FOLATE, FERRITIN, TIBC, IRON, RETICCTPCT in the last 72 hours. Urine analysis:    Component Value Date/Time   COLORURINE YELLOW 02/13/2018 0914   APPEARANCEUR CLEAR 02/13/2018 0914   LABSPEC 1.012 02/13/2018 0914   PHURINE 7.0 02/13/2018 0914   GLUCOSEU NEGATIVE 02/13/2018 0914   HGBUR SMALL (A) 02/13/2018 0914   BILIRUBINUR NEGATIVE 02/13/2018 0914   KETONESUR NEGATIVE 02/13/2018 0914   PROTEINUR NEGATIVE 02/13/2018 0914   UROBILINOGEN 0.2 01/27/2011 1233   NITRITE NEGATIVE 02/13/2018 0914   LEUKOCYTESUR NEGATIVE 02/13/2018 0914    Radiological Exams on Admission: Ct Head Wo Contrast  Result Date: 02/13/2018 CLINICAL DATA:  Vision loss in left eye. EXAM: CT HEAD WITHOUT CONTRAST  TECHNIQUE: Contiguous axial images were obtained from the base of the skull through the vertex without intravenous contrast. COMPARISON:  None. FINDINGS: Brain: Mild age related cerebral atrophy but no significant ventriculomegaly. There is moderate periventricular white matter disease. No findings for acute hemispheric infarction or intracranial hemorrhage. The brainstem and cerebellum are grossly normal. No mass lesions are identified. No abnormalities involving the sella or parasellar regions. The optic nerves appear grossly normal. Vascular: Minimal vascular calcifications but no aneurysm or hyperdense vessels. Skull: No skull fracture or bone lesions. Sinuses/Orbits: Small amount of fluid and mucoperiosteal thickening involving the left maxillary sinus. Mild mucoperiosteal thickening involving the right maxillary sinus. Scattered ethmoid sinus disease. Both halves of the sphenoid sinus are clear. The mastoid air cells and middle ear cavities are clear. Other: No scalp lesions or hematoma. IMPRESSION: 1. Age related cerebral atrophy and periventricular white matter disease. 2. No acute intracranial findings or mass lesions. 3. Scattered sinus disease. Electronically Signed   By: Marijo Sanes M.D.   On: 02/13/2018 09:21    EKG: Independently reviewed.  Normal sinus rhythm.  Low voltage EKGs.  No acute ST-T wave changes.  Assessment/Plan Principal Problem:   TIA (transient ischemic attack) Active Problems:   Hypertension   Sleep apnea   Stroke/cerebrovascular accident ([place])     1.  Unilateral peripheral vision loss: Suspect TIA. Admit to monitored unit because of severity of symptoms. Neurochecks and vital signs as per stroke protocol. Patient was not a TPA and vascular intervention candidate because of minimal symptoms. Patient passed bedside swallow evaluation, will allow cardiac diet. Antiplatelets, patient is on Plavix at home, will add aspirin. Statin, on Crestor will continue. Blood  pressure at goals. Consultations, neurology, speech, PT OT MRI of the brain MRA head neck 2D echocardiogram Hemoglobin A1c and lipid profile for risk stratification.  2.Hypertension: No acute stroke.  Will allow oral home medications to control blood pressure.  3.  Sleep apnea: Untreated.  He does not like to be on CPAP.  He says he has minimal symptoms.   DVT prophylaxis: Lovenox. Code Status: Full code.   Family Communication: cousin sister at the bedside. Disposition Plan: Home. Consults called: Neurology. Admission status: Observation.   Barb Merino MD [facility] Pager [phone]  If 7PM-7AM, please contact night-coverage [email or web address] Password [email or web address]  02/13/2018, 1:57 PM

## 2018-02-13 NOTE — ED Provider Notes (Signed)
[facility] EMERGENCY DEPARTMENT Provider Note   CSN: [id number] Arrival date & time: 02/13/18  6195     History   Chief Complaint Chief Complaint  Patient presents with  . Loss of Vision    HPI Brian Brown is a 85 y.o. male.  Patient is an 85 year old male with a history of hypertension, stroke, prostate cancer in the past, stomach ulcer who is currently on diltiazem, statin and Plavix presenting today with abrupt loss of peripheral vision in his left eye.  Patient states when he went to bed last night he felt completely normal but when he woke up this morning he cannot see anything from his left eye periphery.  He denies headache, unilateral weakness, numbness, speech difficulties.  He has had no recent trauma and states otherwise his vision seems normal.  He has no nausea, vomiting, chest pain or shortness of breath.  He has had no difficulty ambulating.  The history is provided by the patient.    Past Medical History:  Diagnosis Date  . Arthritis    degenerative joint disease  . Bleeding stomach ulcer 25 yrs ago  . Cancer [facility]) 2002   prostate, s/p surgery  . Hepatitis 1981   does not know what kind;denies jaundice  . Hypertension    treated by Dr Delfina Redwood, primary MD  . Sleep apnea 2005   does not wear a Cpap  . Stroke ([place]) 2003   w/ Rt sided weakness-some-rt leg  . Wears dentures    full top  . Wears glasses     Patient Active Problem List   Diagnosis Date Noted  . Hepatitis C reactive 02/06/2011  . Hyponatremia 02/06/2011  . Postoperative anemia due to acute blood loss 02/05/2011  . Hypokalemia 02/05/2011  . Osteoarthritis of hip 01/27/2011  . Hypertension 01/27/2011  . Sleep apnea 01/27/2011  . Stroke/cerebrovascular accident ([place]) 01/27/2011  . Cancer of prostate ([place]) 01/27/2011    Past Surgical History:  Procedure Laterality Date  . CARPAL TUNNEL RELEASE Left 03/29/2013   Procedure: LEFT CARPAL TUNNEL RELEASE;  Surgeon: Wynonia Sours, MD;  Location: [place];  Service: Orthopedics;  Laterality: Left;  ANESTHESIA: IV REGIONAL FAB  . CARPAL TUNNEL RELEASE Right 03/06/2014   Procedure: RIGHT CARPAL TUNNEL RELEASE;  Surgeon: Daryll Brod, MD;  Location: [place];  Service: Orthopedics;  Laterality: Right;  . COLONOSCOPY    . HERNIA REPAIR  2009   rt inguinal hernia  . JOINT REPLACEMENT  12/2001   rt hip ;[place]  2002  . SHOULDER ARTHROSCOPY W/ ROTATOR CUFF REPAIR  2014   lt-dr whitfield  . TOTAL HIP ARTHROPLASTY  02/03/2011   Procedure: TOTAL HIP ARTHROPLASTY;  Surgeon: Garald Balding, MD;  Location: [place];  Service: Orthopedics;  Laterality: Left;  . TRIGGER FINGER RELEASE Left 03/29/2013   Procedure: LEFT MIDDLE/SMALL FINGER RELEASE A-1 PULLEY AND LEFT RING FINGER;  Surgeon: Wynonia Sours, MD;  Location: [place];  Service: Orthopedics;  Laterality: Left;  . TRIGGER FINGER RELEASE Right 03/06/2014   Procedure: RIGHT MIDDLE FINGER,RIGHT RINGER FINGER;  Surgeon: Daryll Brod, MD;  Location: [place];  Service: Orthopedics;  Laterality: Right;        Home Medications    Prior to Admission medications   Medication Sig Start Date End Date Taking? Authorizing Provider  candesartan (ATACAND) 32 MG tablet Take 32 mg by mouth daily.    [provider]  clopidogrel (PLAVIX) 75 MG tablet Take 75 mg by mouth daily with breakfast.    [provider]  diltiazem (DILACOR XR) 240 MG 24 hr capsule Take 240 mg by mouth daily.    [provider]  HYDROcodone-acetaminophen (NORCO) 5-325 MG per tablet Take 1 tablet by mouth every 6 (six) hours as needed for moderate pain. 03/06/14   Daryll Brod, MD  metoprolol tartrate (LOPRESSOR) 25 MG tablet Take 25 mg by mouth 2 (two) times daily.    [provider]  simvastatin (ZOCOR) 40 MG tablet Take 40 mg by mouth every evening.    [provider]    Family  History No family history on file.  Social History Social History   Tobacco Use  . Smoking status: Former Smoker    Packs/day: 1.00    Years: 30.00    Pack years: 30.00    Types: Cigarettes    Last attempt to quit: 01/27/2008    Years since quitting: 10.0  . Smokeless tobacco: Never Used  Substance Use Topics  . Alcohol use: No  . Drug use: No     Allergies   Patient has no known allergies.   Review of Systems Review of Systems  All other systems reviewed and are negative.    Physical Exam Updated Vital Signs There were no vitals taken for this visit.  Physical Exam Vitals signs and nursing note reviewed.  Constitutional:      General: He is not in acute distress.    Appearance: He is well-developed.  HENT:     Head: Normocephalic and atraumatic.  Eyes:     General: Visual field deficit present.     Conjunctiva/sclera: Conjunctivae normal.     Pupils: Pupils are equal, round, and reactive to light.  Neck:     Musculoskeletal: Normal range of motion and neck supple.  Cardiovascular:     Rate and Rhythm: Normal rate and regular rhythm.     Heart sounds: No murmur.  Pulmonary:     Effort: Pulmonary effort is normal. No respiratory distress.     Breath sounds: Normal breath sounds. No wheezing or rales.  Abdominal:     General: There is no distension.     Palpations: Abdomen is soft.     Tenderness: There is no abdominal tenderness. There is no guarding or rebound.  Musculoskeletal: Normal range of motion.        General: No tenderness.  Skin:    General: Skin is warm and dry.     Findings: No erythema or rash.  Neurological:     Mental Status: He is alert and oriented to person, place, and time.     Cranial Nerves: No dysarthria or facial asymmetry.     Sensory: Sensation is intact.     Motor: Motor function is intact. No weakness, abnormal muscle tone or pronator drift.     Coordination: Coordination normal.     Gait: Gait is intact.     Comments:  Left-sided hemianopia with the peripheral vision loss.  Right visual fields are within normal limits.  Psychiatric:        Behavior: Behavior normal.      ED Treatments / Results  Labs (all labs ordered are listed, but only abnormal results are displayed) Labs Reviewed  CBC - Abnormal; Notable for the following components:      Result Value   Hemoglobin 12.7 (*)    All other components within normal limits  COMPREHENSIVE  METABOLIC PANEL - Abnormal; Notable for the following components:   Potassium 3.4 (*)    Creatinine, Ser 1.28 (*)    Albumin 3.2 (*)    AST 13 (*)    GFR calc non Af Amer 51 (*)    GFR calc Af Amer 60 (*)    All other components within normal limits  URINALYSIS, ROUTINE W REFLEX MICROSCOPIC - Abnormal; Notable for the following components:   Hgb urine dipstick SMALL (*)    All other components within normal limits  ETHANOL  PROTIME-INR  APTT  DIFFERENTIAL  RAPID URINE DRUG SCREEN, HOSP PERFORMED  CBG MONITORING, ED    EKG EKG Interpretation  Date/Time:  Sunday February 13 2018 07:53:40 EST Ventricular Rate:  63 PR Interval:    QRS Duration: 95 QT Interval:  402 QTC Calculation: 412 R Axis:   44 Text Interpretation:  Sinus rhythm Low voltage, precordial leads No significant change since last tracing Confirmed by Blanchie Dessert [phone]) on 02/13/2018 7:57:59 AM   Radiology Ct Head Wo Contrast  Result Date: 02/13/2018 CLINICAL DATA:  Vision loss in left eye. EXAM: CT HEAD WITHOUT CONTRAST TECHNIQUE: Contiguous axial images were obtained from the base of the skull through the vertex without intravenous contrast. COMPARISON:  None. FINDINGS: Brain: Mild age related cerebral atrophy but no significant ventriculomegaly. There is moderate periventricular white matter disease. No findings for acute hemispheric infarction or intracranial hemorrhage. The brainstem and cerebellum are grossly normal. No mass lesions are identified. No abnormalities involving the  sella or parasellar regions. The optic nerves appear grossly normal. Vascular: Minimal vascular calcifications but no aneurysm or hyperdense vessels. Skull: No skull fracture or bone lesions. Sinuses/Orbits: Small amount of fluid and mucoperiosteal thickening involving the left maxillary sinus. Mild mucoperiosteal thickening involving the right maxillary sinus. Scattered ethmoid sinus disease. Both halves of the sphenoid sinus are clear. The mastoid air cells and middle ear cavities are clear. Other: No scalp lesions or hematoma. IMPRESSION: 1. Age related cerebral atrophy and periventricular white matter disease. 2. No acute intracranial findings or mass lesions. 3. Scattered sinus disease. Electronically Signed   By: Marijo Sanes M.D.   On: 02/13/2018 09:21    Procedures Procedures (including critical care time)  Medications Ordered in ED Medications - No data to display   Initial Impression / Assessment and Plan / ED Course  I have reviewed the triage vital signs and the nursing notes.  Pertinent labs & imaging results that were available during my care of the patient were reviewed by me and considered in my medical decision making (see chart for details).     Elderly gentleman presenting today with acute onset of left-sided hemianopia.  Patient woke up with these deficits but went to bed last night feeling normal.  Will discuss with neurology whether this should be considered a code stroke as time of onset is unknown but it is wake-up symptoms.  Patient has no other deficits at this time and is otherwise well-appearing.  He does have risk factors of hypertension, hyperlipidemia and has had prior strokes.  He is on Plavix but no other blood thinners.  Unclear if he has a history of atrial fibrillation but he does take diltiazem.  Today patient is in a sinus rhythm.  8:03 AM Discussed pt with Dr. Cheral Marker and pt does not meet code stroke criteria.  He is outside the time window for tPA and he  is NOT LVO positive so endovascular would not be warrented.  Will  continue with stroke work up and neuro to see.  9:57 AM Labs without acute findings.  CT with out acute finding.  Feel that patient will need admission for stroke work-up as his symptoms are persistent.  Final Clinical Impressions(s) / ED Diagnoses   Final diagnoses:  Left homonymous hemianopsia  Cerebrovascular accident (CVA), unspecified mechanism [facility])    ED Discharge Orders    None       Blanchie Dessert, MD 02/13/18 [phone]

## 2018-02-13 NOTE — ED Triage Notes (Signed)
Patient presents with L sided vision loss. Patient states he felt fine last night when he went to bed and woke up this am unable to see out of his L eye. Pt denies any other neuro symptoms. Ambulatory from wheelchair to stretcher, moves all limbs equally, speech clear, face symmetrical.

## 2018-02-13 NOTE — Consult Note (Signed)
Neurology Consultation Reason for Consult: Stroke Referring Physician: Sloan Leiter, K  CC: Visual change  History is obtained from: Patient, nephew  HPI: Brian Brown is a 85 y.o. male with a history of hypertension, stroke, who went to bed at 9:30 PM last night and awoke this morning with visual change.  He states that he notices that he cannot see things on the left.  He has not noticed any other symptoms including numbness, weakness, or other symptoms.  His nephew states that he is groggy but otherwise his normal self.   LKW: 9:30 PM 2/8 tpa given?: no, outside of window    ROS: A 14 point ROS was performed and is negative except as noted in the HPI.    Past Medical History:  Diagnosis Date  . Arthritis    degenerative joint disease  . Bleeding stomach ulcer 25 yrs ago  . Cancer [facility]) 2002   prostate, s/p surgery  . Hepatitis 1981   does not know what kind;denies jaundice  . Hypertension    treated by Dr Delfina Redwood, primary MD  . Sleep apnea 2005   does not wear a Cpap  . Stroke ([place]) 2003   w/ Rt sided weakness-some-rt leg  . Wears dentures    full top  . Wears glasses      History reviewed. No pertinent family history.   Social History:  reports that he quit smoking about 10 years ago. His smoking use included cigarettes. He has a 30.00 pack-year smoking history. He has never used smokeless tobacco. He reports that he does not drink alcohol or use drugs.   Exam: Current vital signs: BP 110/69   Pulse (!) 52   Temp 98 F (36.7 C) (Oral)   Resp (!) 21   SpO2 94%  Vital signs in last 24 hours: Temp:  [97.8 F (36.6 C)-98.6 F (37 C)] 98 F (36.7 C) (02/09 1926) Pulse Rate:  [52-101] 52 (02/09 2100) Resp:  [11-21] 21 (02/09 2100) BP: (110-159)/(69-96) 110/69 (02/09 2100) SpO2:  [94 %-100 %] 94 % (02/09 2100)   Physical Exam  Constitutional: Appears well-developed and well-nourished.  Psych: Affect appropriate to situation Eyes: No scleral  injection HENT: No OP obstrucion Head: Normocephalic.  Cardiovascular: Normal rate and regular rhythm.  Respiratory: Effort normal, non-labored breathing GI: Soft.  No distension. There is no tenderness.  Skin: WDI  Neuro: Mental Status: Patient is awake, alert, oriented to person, place, month, year, and situation. Patient is able to give a clear and coherent history. No signs of aphasia or neglect Cranial Nerves: II: Visual Fields with dense left hemianopia. Pupils are equal, round, and reactive to light.   III,IV, VI: EOMI without ptosis or diploplia.  V: Facial sensation is symmetric to temperature VII: Facial movement is symmetric.  VIII: hearing is intact to voice X: Uvula elevates symmetrically XI: Shoulder shrug is symmetric. XII: tongue is midline without atrophy or fasciculations.  Motor: Tone is normal. Bulk is normal. 5/5 strength was present in bilateral arms,?  5-/5 in the right leg, but could be effort dependent. Sensory: Sensation is symmetric to light touch and temperature in the arms and legs. Cerebellar: He has mild intentional tremor on finger-nose-finger bilaterally     I have reviewed labs in epic and the results pertinent to this consultation are: CMP- mildly elevated creatinine, mild hypokalemia at 3.4  I have reviewed the images obtained: MRI brain-right PCA stroke without clear PCA occlusion  Impression: 85 year old male with right PCA infarct.  I wonder if he embolized and subsequently recanalized given the open appearance on MRA.  He is being admitted for work-up and therapy evaluations.  Recommendations: - HgbA1c, fasting lipid panel - Frequent neuro checks - Echocardiogram - Prophylactic therapy-Antiplatelet med: Aspirin - dose 325mg  PO or 300mg  PR and plavix x 3 weeks.  - Risk factor modification - Telemetry monitoring - PT consult, OT consult, Speech consult - Stroke team to follow  Roland Rack, MD [facility] [phone]  If 7pm- 7am, please page neurology on call as listed in [place].

## 2018-02-13 NOTE — Progress Notes (Signed)
Patient arrived to [phone]. Alert and oriented x4. Denies pain.CCMd has been notified. Plan of care has been given to patient. Family member at bedside.

## 2018-02-14 ENCOUNTER — Inpatient Hospital Stay (HOSPITAL_COMMUNITY): Payer: Medicare Other

## 2018-02-14 ENCOUNTER — Other Ambulatory Visit: Payer: Self-pay

## 2018-02-14 ENCOUNTER — Other Ambulatory Visit (HOSPITAL_COMMUNITY): Payer: Medicare Other

## 2018-02-14 DIAGNOSIS — Z972 Presence of dental prosthetic device (complete) (partial): Secondary | ICD-10-CM | POA: Diagnosis not present

## 2018-02-14 DIAGNOSIS — Z8546 Personal history of malignant neoplasm of prostate: Secondary | ICD-10-CM | POA: Diagnosis not present

## 2018-02-14 DIAGNOSIS — G459 Transient cerebral ischemic attack, unspecified: Secondary | ICD-10-CM | POA: Diagnosis not present

## 2018-02-14 DIAGNOSIS — G4730 Sleep apnea, unspecified: Secondary | ICD-10-CM | POA: Diagnosis not present

## 2018-02-14 DIAGNOSIS — I10 Essential (primary) hypertension: Secondary | ICD-10-CM | POA: Diagnosis not present

## 2018-02-14 DIAGNOSIS — H53462 Homonymous bilateral field defects, left side: Secondary | ICD-10-CM | POA: Diagnosis present

## 2018-02-14 DIAGNOSIS — I69351 Hemiplegia and hemiparesis following cerebral infarction affecting right dominant side: Secondary | ICD-10-CM | POA: Diagnosis not present

## 2018-02-14 DIAGNOSIS — R29702 NIHSS score 2: Secondary | ICD-10-CM | POA: Diagnosis present

## 2018-02-14 DIAGNOSIS — Z79899 Other long term (current) drug therapy: Secondary | ICD-10-CM | POA: Diagnosis not present

## 2018-02-14 DIAGNOSIS — Z7902 Long term (current) use of antithrombotics/antiplatelets: Secondary | ICD-10-CM | POA: Diagnosis not present

## 2018-02-14 DIAGNOSIS — I63431 Cerebral infarction due to embolism of right posterior cerebral artery: Secondary | ICD-10-CM | POA: Diagnosis present

## 2018-02-14 DIAGNOSIS — Z9079 Acquired absence of other genital organ(s): Secondary | ICD-10-CM | POA: Diagnosis not present

## 2018-02-14 DIAGNOSIS — I159 Secondary hypertension, unspecified: Secondary | ICD-10-CM | POA: Diagnosis not present

## 2018-02-14 DIAGNOSIS — Z87891 Personal history of nicotine dependence: Secondary | ICD-10-CM | POA: Diagnosis not present

## 2018-02-14 DIAGNOSIS — I639 Cerebral infarction, unspecified: Secondary | ICD-10-CM

## 2018-02-14 DIAGNOSIS — M1990 Unspecified osteoarthritis, unspecified site: Secondary | ICD-10-CM | POA: Diagnosis present

## 2018-02-14 DIAGNOSIS — R251 Tremor, unspecified: Secondary | ICD-10-CM | POA: Diagnosis present

## 2018-02-14 DIAGNOSIS — Z96643 Presence of artificial hip joint, bilateral: Secondary | ICD-10-CM | POA: Diagnosis present

## 2018-02-14 DIAGNOSIS — G4733 Obstructive sleep apnea (adult) (pediatric): Secondary | ICD-10-CM | POA: Diagnosis present

## 2018-02-14 DIAGNOSIS — Z8711 Personal history of peptic ulcer disease: Secondary | ICD-10-CM | POA: Diagnosis not present

## 2018-02-14 LAB — LIPID PANEL
Cholesterol: 117 mg/dL (ref 0–200)
HDL: 45 mg/dL (ref >40)
LDL Cholesterol: 62 mg/dL (ref 0–99)
Total CHOL/HDL Ratio: 2.6 RATIO
Triglycerides: 48 mg/dL (ref <150)
VLDL: 10 mg/dL (ref 0–40)

## 2018-02-14 LAB — HEMOGLOBIN A1C
Hgb A1c MFr Bld: 5.8 % — ABNORMAL HIGH (ref 4.8–5.6)
Mean Plasma Glucose: 119.76 mg/dL

## 2018-02-14 LAB — ECHOCARDIOGRAM COMPLETE

## 2018-02-14 MED ORDER — ASPIRIN EC 81 MG PO TBEC: 81.0000 mg | DELAYED_RELEASE_TABLET | Freq: Every day | ORAL | Status: DC

## 2018-02-14 MED ORDER — ASPIRIN 325 MG PO TABS
325.0000 mg | ORAL_TABLET | Freq: Every day | ORAL | Status: DC
  Administered 2018-02-14: 325 mg via ORAL
  Filled 2018-02-14: qty 1

## 2018-02-14 NOTE — Progress Notes (Signed)
PROGRESS NOTE    Brian Brown  NLZ:[id number] DOB: [birth year] DOA: 02/13/2018 PCP: Seward Carol, MD   Brief Narrative:  Per admitting  MD: Brian Brown is a 85 y.o. male with medical history significant of history of a stroke with right hemiparesis, prostate cancer, sleep apnea currently not wearing CPAP, hypertension who presents to the emergency room with sudden onset of loss of vision on the left side.  According to the patient, he had a stroke in 2010 and since had some weakness on the right leg, however he has been functioning normally.  He occasionally uses cane otherwise independent.  He does use Plavix.Patient stated that he went to bed last night with his normal routine, he woke up today morning and he cannot see anything from his left eye on the outer aspect.  He denies any other symptoms including headache, nausea, vomiting, weakness, speech difficulties.  He has no new weakness on his extremities.  Denies any fever cough shortness of breath flulike symptoms.  Denies any pain in his eyes. ED Course: Hemodynamically stable in the ER.  Creatinine 1.28.  CT scan of the head is essentially normal.  He was found to have left hemianopsia.  No other focal neurological deficits.  Not a TPA candidate because of minimal symptoms.  Code stroke was called and patient was in no need of acute intervention including TPA and clot retrieval.  Given aspirin in the ER.   Assessment & Plan:   Principal Problem:   TIA (transient ischemic attack) Active Problems:   Hypertension   Sleep apnea   Stroke/cerebrovascular accident [facility])   Acute CVA (cerebrovascular accident) ([place])   1.  Occipital CVA: Unilateral peripheral vision loss: Neurochecks and vital signs as per stroke protocol. Patient was not a TPA and vascular intervention candidate because of minimal symptoms. Patient passed bedside swallow evaluation, C/W cardiac diet. Antiplatelets, patient is on Plavix at home, will c/w  aspirin. Statin, on Crestor will continue. Blood pressure at goals. Consultations: neurology-appreciate their input, speech-no limitations, PT- penidng, OT-pending MRI of the brain revealed medial right occipital cva MRA head neck-no acute findings 2D echocardiogram-penidng Hemoglobin A1c 5.8, and lipid profile TC 117, HLD 45, LDL 62, TRIG 48.  2.Hypertension: home meds were restarted on admission, monitor bp now, cva sx improving with mra showing no pca occlusion in occipital lobe  3.  Sleep apnea: Untreated.  He does not like to be on CPAP.  He says he has minimal symptoms. Discuss further with pt and family  DVT prophylaxis: Lovenox SQ  Code Status: FULL    Code Status Orders  (From admission, onward)         Start     Ordered   02/13/18 1232  Full code  Continuous     02/13/18 1231        Code Status History    Date Active Date Inactive Code Status Order ID Comments User Context   02/03/2011 1510 02/06/2011 1425 Full Code [id number]  Brian Brown., RN Inpatient    Advance Directive Documentation     Most Recent Value  Type of Advance Directive  Healthcare Power of Attorney  Pre-existing out of facility DNR order (yellow form or pink MOST form)  -  "MOST" Form in Place?  -     Family Communication: NEPHEW  Disposition Plan:   HOME LIKELY 1 DAY Consults called: None Admission status: Inpatient   Consultants:   NEURO  Procedures:  Ct Head Wo Contrast  Result Date: 02/13/2018 CLINICAL DATA:  Vision loss in left eye. EXAM: CT HEAD WITHOUT CONTRAST TECHNIQUE: Contiguous axial images were obtained from the base of the skull through the vertex without intravenous contrast. COMPARISON:  None. FINDINGS: Brain: Mild age related cerebral atrophy but no significant ventriculomegaly. There is moderate periventricular white matter disease. No findings for acute hemispheric infarction or intracranial hemorrhage. The brainstem and cerebellum are grossly normal. No mass  lesions are identified. No abnormalities involving the sella or parasellar regions. The optic nerves appear grossly normal. Vascular: Minimal vascular calcifications but no aneurysm or hyperdense vessels. Skull: No skull fracture or bone lesions. Sinuses/Orbits: Small amount of fluid and mucoperiosteal thickening involving the left maxillary sinus. Mild mucoperiosteal thickening involving the right maxillary sinus. Scattered ethmoid sinus disease. Both halves of the sphenoid sinus are clear. The mastoid air cells and middle ear cavities are clear. Other: No scalp lesions or hematoma. IMPRESSION: 1. Age related cerebral atrophy and periventricular white matter disease. 2. No acute intracranial findings or mass lesions. 3. Scattered sinus disease. Electronically Signed   By: Marijo Sanes M.D.   On: 02/13/2018 09:21   Mr Brian Brown Neck W Wo Contrast  Result Date: 02/13/2018 CLINICAL DATA:  85 year old male presents with abrupt loss of left eye peripheral vision. No associated pain or headache. EXAM: MRI HEAD WITHOUT CONTRAST MRA HEAD WITHOUT CONTRAST MRA NECK WITHOUT AND WITH CONTRAST TECHNIQUE: Multiplanar, multiecho pulse sequences of the brain and surrounding structures were obtained without and with intravenous contrast. Angiographic images of the Circle of Willis were obtained using MRA technique without intravenous contrast. Angiographic images of the neck were obtained using MRA technique without and with intravenous contrast. Carotid stenosis measurements (when applicable) are obtained utilizing NASCET criteria, using the distal internal carotid diameter as the denominator. CONTRAST:  9 milliliters Gadavist. COMPARISON:  Head CT without contrast earlier today. FINDINGS: MRI HEAD FINDINGS Brain: Confluent cortical and some subcortical white matter restricted diffusion in the medial right occipital lobe in an area of 3-4 centimeters (series 5, image 78 and coronal image 40). Associated cytotoxic edema with T2 and  FLAIR hyperintensity. No acute hemorrhage or mass effect. No other restricted diffusion. But there is widespread T2 heterogeneity throughout the bilateral deep gray matter nuclei. Much of this appears to represent dilated perivascular spaces, but there are chronic lacunar infarcts of the left corona radiata and posterior lentiform. Similar widespread and patchy bilateral cerebral white matter T2 and FLAIR hyperintensity. Susceptibility weighted images demonstrate a chronic microhemorrhage in each occipital lobe and 1 in the right deep cerebellar nuclei. No cortical encephalomalacia. Normal signal in the brainstem and cerebellum. No midline shift, mass effect, evidence of mass lesion, ventriculomegaly, extra-axial collection or acute intracranial hemorrhage. Cervicomedullary junction and pituitary are within normal limits. Vascular: Major intracranial vascular flow voids are preserved. There is generalized intracranial artery dolichoectasia. And distal left vertebral artery tortuosity results in mild mass effect on the ventral medulla (series 13, image 11). Skull and upper cervical spine: Bulky cervical spine endplate spurring suggesting Diffuse idiopathic skeletal hyperostosis (DISH). Visualized bone marrow signal is within normal limits. Sinuses/Orbits: Optic chiasm and orbits soft tissues are normal; prior cataract surgery. Mild to moderate left maxillary sinus mucosal thickening with a small fluid level. Trace paranasal sinus mucosal thickening elsewhere. Other: Mastoid air cells are clear. Visible internal auditory structures appear normal. Small volume retained secretions in the nasopharynx. Scalp and face soft tissues appear negative. MRA NECK FINDINGS Precontrast time-of-flight images demonstrate antegrade flow in both cervical  carotid and vertebral arteries. Codominant vertebral arteries. Normal time-of-flight appearance at both carotid bifurcations. Post-contrast neck MRA images reveal a bovine type arch  configuration. Proximal great vessels are tortuous, but otherwise within normal limits. Tortuous proximal right CCA with a mildly kinked appearance. Widely patent right carotid bifurcation and cervical right ICA. Tortuous proximal left CCA. Widely patent left carotid bifurcation and cervical left ICA. No proximal subclavian artery stenosis. Vertebral artery origins and cervical vertebral arteries are within normal limits. MRA HEAD FINDINGS Codominant and ectatic distal vertebral arteries without stenosis. Mass effect on the ventral medulla from the tortuous distal left vertebral. Right PICA and dominant appearing left AICA origins are patent. Tortuous basilar artery with mild irregularity but no stenosis. Patent SCA and PCA origins. Posterior communicating arteries are diminutive or absent. Tortuous left P1. There is mild left P2 segment stenosis. The right PCA branches are within normal limits. Antegrade flow in dolichoectatic ICA siphons. No siphon stenosis. Normal ophthalmic artery origins. Patent carotid termini. Normal MCA and ACA origins. Anterior communicating artery is normal. Visible ACA branches are normal aside from tortuosity. MCA M1 segments are mildly tortuous. Bilateral MCA bifurcations and visible MCA branches are patent without stenosis. IMPRESSION: 1. 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 No associated hemorrhage or mass effect. Normal MRA appearance of the right PCA. 2. Generalized arterial dolichoectasia in the head and neck. No large vessel stenosis. There is mild stenosis of the left PCA P2. 3. Evidence of chronic small vessel disease in the brain, moderate for age, and with occasional chronic micro hemorrhages. Electronically Signed   By: Genevie Ann M.D.   On: 02/13/2018 18:39   Mr Brain Wo Contrast  Result Date: 02/13/2018 CLINICAL DATA:  85 year old male presents with abrupt loss of left eye peripheral vision. No associated pain or headache. EXAM: MRI HEAD  WITHOUT CONTRAST MRA HEAD WITHOUT CONTRAST MRA NECK WITHOUT AND WITH CONTRAST TECHNIQUE: Multiplanar, multiecho pulse sequences of the brain and surrounding structures were obtained without and with intravenous contrast. Angiographic images of the Circle of Willis were obtained using MRA technique without intravenous contrast. Angiographic images of the neck were obtained using MRA technique without and with intravenous contrast. Carotid stenosis measurements (when applicable) are obtained utilizing NASCET criteria, using the distal internal carotid diameter as the denominator. CONTRAST:  9 milliliters Gadavist. COMPARISON:  Head CT without contrast earlier today. FINDINGS: MRI HEAD FINDINGS Brain: Confluent cortical and some subcortical white matter restricted diffusion in the medial right occipital lobe in an area of 3-4 centimeters (series 5, image 78 and coronal image 40). Associated cytotoxic edema with T2 and FLAIR hyperintensity. No acute hemorrhage or mass effect. No other restricted diffusion. But there is widespread T2 heterogeneity throughout the bilateral deep gray matter nuclei. Much of this appears to represent dilated perivascular spaces, but there are chronic lacunar infarcts of the left corona radiata and posterior lentiform. Similar widespread and patchy bilateral cerebral white matter T2 and FLAIR hyperintensity. Susceptibility weighted images demonstrate a chronic microhemorrhage in each occipital lobe and 1 in the right deep cerebellar nuclei. No cortical encephalomalacia. Normal signal in the brainstem and cerebellum. No midline shift, mass effect, evidence of mass lesion, ventriculomegaly, extra-axial collection or acute intracranial hemorrhage. Cervicomedullary junction and pituitary are within normal limits. Vascular: Major intracranial vascular flow voids are preserved. There is generalized intracranial artery dolichoectasia. And distal left vertebral artery tortuosity results in mild mass  effect on the ventral medulla (series 13, image 11). Skull  and upper cervical spine: Bulky cervical spine endplate spurring suggesting Diffuse idiopathic skeletal hyperostosis (DISH). Visualized bone marrow signal is within normal limits. Sinuses/Orbits: Optic chiasm and orbits soft tissues are normal; prior cataract surgery. Mild to moderate left maxillary sinus mucosal thickening with a small fluid level. Trace paranasal sinus mucosal thickening elsewhere. Other: Mastoid air cells are clear. Visible internal auditory structures appear normal. Small volume retained secretions in the nasopharynx. Scalp and face soft tissues appear negative. MRA NECK FINDINGS Precontrast time-of-flight images demonstrate antegrade flow in both cervical carotid and vertebral arteries. Codominant vertebral arteries. Normal time-of-flight appearance at both carotid bifurcations. Post-contrast neck MRA images reveal a bovine type arch configuration. Proximal great vessels are tortuous, but otherwise within normal limits. Tortuous proximal right CCA with a mildly kinked appearance. Widely patent right carotid bifurcation and cervical right ICA. Tortuous proximal left CCA. Widely patent left carotid bifurcation and cervical left ICA. No proximal subclavian artery stenosis. Vertebral artery origins and cervical vertebral arteries are within normal limits. MRA HEAD FINDINGS Codominant and ectatic distal vertebral arteries without stenosis. Mass effect on the ventral medulla from the tortuous distal left vertebral. Right PICA and dominant appearing left AICA origins are patent. Tortuous basilar artery with mild irregularity but no stenosis. Patent SCA and PCA origins. Posterior communicating arteries are diminutive or absent. Tortuous left P1. There is mild left P2 segment stenosis. The right PCA branches are within normal limits. Antegrade flow in dolichoectatic ICA siphons. No siphon stenosis. Normal ophthalmic artery origins. Patent  carotid termini. Normal MCA and ACA origins. Anterior communicating artery is normal. Visible ACA branches are normal aside from tortuosity. MCA M1 segments are mildly tortuous. Bilateral MCA bifurcations and visible MCA branches are patent without stenosis. IMPRESSION: 1. 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 No associated hemorrhage or mass effect. Normal MRA appearance of the right PCA. 2. Generalized arterial dolichoectasia in the head and neck. No large vessel stenosis. There is mild stenosis of the left PCA P2. 3. Evidence of chronic small vessel disease in the brain, moderate for age, and with occasional chronic micro hemorrhages. Electronically Signed   By: Genevie Ann M.D.   On: 02/13/2018 18:39   Mr Virgel Paling XH Contrast  Result Date: 02/13/2018 CLINICAL DATA:  85 year old male presents with abrupt loss of left eye peripheral vision. No associated pain or headache. EXAM: MRI HEAD WITHOUT CONTRAST MRA HEAD WITHOUT CONTRAST MRA NECK WITHOUT AND WITH CONTRAST TECHNIQUE: Multiplanar, multiecho pulse sequences of the brain and surrounding structures were obtained without and with intravenous contrast. Angiographic images of the Circle of Willis were obtained using MRA technique without intravenous contrast. Angiographic images of the neck were obtained using MRA technique without and with intravenous contrast. Carotid stenosis measurements (when applicable) are obtained utilizing NASCET criteria, using the distal internal carotid diameter as the denominator. CONTRAST:  9 milliliters Gadavist. COMPARISON:  Head CT without contrast earlier today. FINDINGS: MRI HEAD FINDINGS Brain: Confluent cortical and some subcortical white matter restricted diffusion in the medial right occipital lobe in an area of 3-4 centimeters (series 5, image 78 and coronal image 40). Associated cytotoxic edema with T2 and FLAIR hyperintensity. No acute hemorrhage or mass effect. No other restricted  diffusion. But there is widespread T2 heterogeneity throughout the bilateral deep gray matter nuclei. Much of this appears to represent dilated perivascular spaces, but there are chronic lacunar infarcts of the left corona radiata and posterior lentiform. Similar widespread and patchy bilateral cerebral white  matter T2 and FLAIR hyperintensity. Susceptibility weighted images demonstrate a chronic microhemorrhage in each occipital lobe and 1 in the right deep cerebellar nuclei. No cortical encephalomalacia. Normal signal in the brainstem and cerebellum. No midline shift, mass effect, evidence of mass lesion, ventriculomegaly, extra-axial collection or acute intracranial hemorrhage. Cervicomedullary junction and pituitary are within normal limits. Vascular: Major intracranial vascular flow voids are preserved. There is generalized intracranial artery dolichoectasia. And distal left vertebral artery tortuosity results in mild mass effect on the ventral medulla (series 13, image 11). Skull and upper cervical spine: Bulky cervical spine endplate spurring suggesting Diffuse idiopathic skeletal hyperostosis (DISH). Visualized bone marrow signal is within normal limits. Sinuses/Orbits: Optic chiasm and orbits soft tissues are normal; prior cataract surgery. Mild to moderate left maxillary sinus mucosal thickening with a small fluid level. Trace paranasal sinus mucosal thickening elsewhere. Other: Mastoid air cells are clear. Visible internal auditory structures appear normal. Small volume retained secretions in the nasopharynx. Scalp and face soft tissues appear negative. MRA NECK FINDINGS Precontrast time-of-flight images demonstrate antegrade flow in both cervical carotid and vertebral arteries. Codominant vertebral arteries. Normal time-of-flight appearance at both carotid bifurcations. Post-contrast neck MRA images reveal a bovine type arch configuration. Proximal great vessels are tortuous, but otherwise within normal  limits. Tortuous proximal right CCA with a mildly kinked appearance. Widely patent right carotid bifurcation and cervical right ICA. Tortuous proximal left CCA. Widely patent left carotid bifurcation and cervical left ICA. No proximal subclavian artery stenosis. Vertebral artery origins and cervical vertebral arteries are within normal limits. MRA HEAD FINDINGS Codominant and ectatic distal vertebral arteries without stenosis. Mass effect on the ventral medulla from the tortuous distal left vertebral. Right PICA and dominant appearing left AICA origins are patent. Tortuous basilar artery with mild irregularity but no stenosis. Patent SCA and PCA origins. Posterior communicating arteries are diminutive or absent. Tortuous left P1. There is mild left P2 segment stenosis. The right PCA branches are within normal limits. Antegrade flow in dolichoectatic ICA siphons. No siphon stenosis. Normal ophthalmic artery origins. Patent carotid termini. Normal MCA and ACA origins. Anterior communicating artery is normal. Visible ACA branches are normal aside from tortuosity. MCA M1 segments are mildly tortuous. Bilateral MCA bifurcations and visible MCA branches are patent without stenosis. IMPRESSION: 1. 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 No associated hemorrhage or mass effect. Normal MRA appearance of the right PCA. 2. Generalized arterial dolichoectasia in the head and neck. No large vessel stenosis. There is mild stenosis of the left PCA P2. 3. Evidence of chronic small vessel disease in the brain, moderate for age, and with occasional chronic micro hemorrhages. Electronically Signed   By: Genevie Ann M.D.   On: 02/13/2018 18:39   Vas [place] Lower Extremity Venous (dvt)  Result Date: 02/14/2018  Lower Venous Study Indications: Stroke.  Comparison Study: No prior study on file for comparison Performing Technologist: Sharion Dove RVS  Examination Guidelines: A complete evaluation includes  B-mode imaging, spectral Doppler, color Doppler, and power Doppler as needed of all accessible portions of each vessel. Bilateral testing is considered an integral part of a complete examination. Limited examinations for reoccurring indications may be performed as noted.  Right Venous Findings: +---------+---------------+---------+-----------+----------+-------+          CompressibilityPhasicitySpontaneityPropertiesSummary +---------+---------------+---------+-----------+----------+-------+ CFV      Full           Yes      Yes                          +---------+---------------+---------+-----------+----------+-------+  SFJ      Full                                                 +---------+---------------+---------+-----------+----------+-------+ FV Prox  Full                                                 +---------+---------------+---------+-----------+----------+-------+ FV Mid   Full                                                 +---------+---------------+---------+-----------+----------+-------+ FV DistalFull                                                 +---------+---------------+---------+-----------+----------+-------+ PFV      Full                                                 +---------+---------------+---------+-----------+----------+-------+ POP      Full           Yes      Yes                          +---------+---------------+---------+-----------+----------+-------+ PTV      Full                                                 +---------+---------------+---------+-----------+----------+-------+ PERO     Full                                                 +---------+---------------+---------+-----------+----------+-------+  Left Venous Findings: +---------+---------------+---------+-----------+----------+-------+          CompressibilityPhasicitySpontaneityPropertiesSummary  +---------+---------------+---------+-----------+----------+-------+ CFV      Full           Yes      Yes                          +---------+---------------+---------+-----------+----------+-------+ SFJ      Full                                                 +---------+---------------+---------+-----------+----------+-------+ FV Prox  Full                                                 +---------+---------------+---------+-----------+----------+-------+  FV Mid   Full                                                 +---------+---------------+---------+-----------+----------+-------+ FV DistalFull                                                 +---------+---------------+---------+-----------+----------+-------+ PFV      Full                                                 +---------+---------------+---------+-----------+----------+-------+ POP      Full           Yes      Yes                          +---------+---------------+---------+-----------+----------+-------+ PTV      Full                                                 +---------+---------------+---------+-----------+----------+-------+ PERO     Full                                                 +---------+---------------+---------+-----------+----------+-------+    Summary: Right: There is no evidence of deep vein thrombosis in the lower extremity. Left: There is no evidence of deep vein thrombosis in the lower extremity.  *See table(s) above for measurements and observations.    Preliminary      Antimicrobials:   NONE    Subjective: Pt reports visual fields have improved since admission, no other complaints, nephew at bedside, answered all questions  Objective: Vitals:   02/14/18 0300 02/14/18 0451 02/14/18 0739 02/14/18 1135  BP:  138/85  (!) 144/101  Pulse:  (!) 53  66  Resp:  18    Temp: 97.8 F (36.6 C)  98.4 F (36.9 C) 98.2 F (36.8 C)  TempSrc: Oral  Oral  Oral  SpO2:  98%  100%    Intake/Output Summary (Last 24 hours) at 02/14/2018 1517 Last data filed at 02/14/2018 0730 Gross per 24 hour  Intake -  Output 1175 ml  Net -1175 ml   There were no vitals filed for this visit.  Examination:  General exam: Appears calm and comfortable  Respiratory system: Clear to auscultation. Respiratory effort normal. Cardiovascular system: S1 & S2 heard, RRR. No JVD, murmurs, rubs, gallops or clicks. No pedal edema. Gastrointestinal system: Abdomen is nondistended, soft and nontender. No organomegaly or masses felt. Normal bowel sounds heard. Central nervous system: Alert and oriented. Left field cut Extremities: Symmetric 5 x 5 power.no edema Skin: No rashes, lesions or ulcers Psychiatry: Judgement and insight appear normal. Mood & affect appropriate.     Data Reviewed: I have personally reviewed following labs and imaging studies  CBC: Recent Labs  Lab 02/13/18 0815  WBC 6.2  NEUTROABS 4.1  HGB 12.7*  HCT 40.2  MCV 89.3  PLT 004   Basic Metabolic Panel: Recent Labs  Lab 02/13/18 0815  NA 140  K 3.4*  CL 105  CO2 27  GLUCOSE 95  BUN 12  CREATININE 1.28*  CALCIUM 9.0   GFR: CrCl cannot be calculated (Unknown ideal weight.). Liver Function Tests: Recent Labs  Lab 02/13/18 0815  AST 13*  ALT 15  ALKPHOS 64  BILITOT 0.8  PROT 6.6  ALBUMIN 3.2*   No results for input(s): LIPASE, AMYLASE in the last 168 hours. No results for input(s): AMMONIA in the last 168 hours. Coagulation Profile: Recent Labs  Lab 02/13/18 0815  INR 1.02   Cardiac Enzymes: No results for input(s): CKTOTAL, CKMB, CKMBINDEX, TROPONINI in the last 168 hours. BNP (last 3 results) No results for input(s): PROBNP in the last 8760 hours. HbA1C: Recent Labs    02/14/18 0303  HGBA1C 5.8*   CBG: Recent Labs  Lab 02/13/18 0752  GLUCAP 92   Lipid Profile: Recent Labs    02/14/18 0303  CHOL 117  HDL 45  LDLCALC 62  TRIG 48  CHOLHDL 2.6    Thyroid Function Tests: No results for input(s): TSH, T4TOTAL, FREET4, T3FREE, THYROIDAB in the last 72 hours. Anemia Panel: No results for input(s): VITAMINB12, FOLATE, FERRITIN, TIBC, IRON, RETICCTPCT in the last 72 hours. Sepsis Labs: No results for input(s): PROCALCITON, LATICACIDVEN in the last 168 hours.  No results found for this or any previous visit (from the past 240 hour(s)).       Radiology Studies: Ct Head Wo Contrast  Result Date: 02/13/2018 CLINICAL DATA:  Vision loss in left eye. EXAM: CT HEAD WITHOUT CONTRAST TECHNIQUE: Contiguous axial images were obtained from the base of the skull through the vertex without intravenous contrast. COMPARISON:  None. FINDINGS: Brain: Mild age related cerebral atrophy but no significant ventriculomegaly. There is moderate periventricular white matter disease. No findings for acute hemispheric infarction or intracranial hemorrhage. The brainstem and cerebellum are grossly normal. No mass lesions are identified. No abnormalities involving the sella or parasellar regions. The optic nerves appear grossly normal. Vascular: Minimal vascular calcifications but no aneurysm or hyperdense vessels. Skull: No skull fracture or bone lesions. Sinuses/Orbits: Small amount of fluid and mucoperiosteal thickening involving the left maxillary sinus. Mild mucoperiosteal thickening involving the right maxillary sinus. Scattered ethmoid sinus disease. Both halves of the sphenoid sinus are clear. The mastoid air cells and middle ear cavities are clear. Other: No scalp lesions or hematoma. IMPRESSION: 1. Age related cerebral atrophy and periventricular white matter disease. 2. No acute intracranial findings or mass lesions. 3. Scattered sinus disease. Electronically Signed   By: Marijo Sanes M.D.   On: 02/13/2018 09:21   Mr Brian Brown Neck W Wo Contrast  Result Date: 02/13/2018 CLINICAL DATA:  85 year old male presents with abrupt loss of left eye peripheral vision. No  associated pain or headache. EXAM: MRI HEAD WITHOUT CONTRAST MRA HEAD WITHOUT CONTRAST MRA NECK WITHOUT AND WITH CONTRAST TECHNIQUE: Multiplanar, multiecho pulse sequences of the brain and surrounding structures were obtained without and with intravenous contrast. Angiographic images of the Circle of Willis were obtained using MRA technique without intravenous contrast. Angiographic images of the neck were obtained using MRA technique without and with intravenous contrast. Carotid stenosis measurements (when applicable) are obtained utilizing NASCET criteria, using the distal internal carotid diameter as the denominator. CONTRAST:  9 milliliters Gadavist. COMPARISON:  Head  CT without contrast earlier today. FINDINGS: MRI HEAD FINDINGS Brain: Confluent cortical and some subcortical white matter restricted diffusion in the medial right occipital lobe in an area of 3-4 centimeters (series 5, image 78 and coronal image 40). Associated cytotoxic edema with T2 and FLAIR hyperintensity. No acute hemorrhage or mass effect. No other restricted diffusion. But there is widespread T2 heterogeneity throughout the bilateral deep gray matter nuclei. Much of this appears to represent dilated perivascular spaces, but there are chronic lacunar infarcts of the left corona radiata and posterior lentiform. Similar widespread and patchy bilateral cerebral white matter T2 and FLAIR hyperintensity. Susceptibility weighted images demonstrate a chronic microhemorrhage in each occipital lobe and 1 in the right deep cerebellar nuclei. No cortical encephalomalacia. Normal signal in the brainstem and cerebellum. No midline shift, mass effect, evidence of mass lesion, ventriculomegaly, extra-axial collection or acute intracranial hemorrhage. Cervicomedullary junction and pituitary are within normal limits. Vascular: Major intracranial vascular flow voids are preserved. There is generalized intracranial artery dolichoectasia. And distal left  vertebral artery tortuosity results in mild mass effect on the ventral medulla (series 13, image 11). Skull and upper cervical spine: Bulky cervical spine endplate spurring suggesting Diffuse idiopathic skeletal hyperostosis (DISH). Visualized bone marrow signal is within normal limits. Sinuses/Orbits: Optic chiasm and orbits soft tissues are normal; prior cataract surgery. Mild to moderate left maxillary sinus mucosal thickening with a small fluid level. Trace paranasal sinus mucosal thickening elsewhere. Other: Mastoid air cells are clear. Visible internal auditory structures appear normal. Small volume retained secretions in the nasopharynx. Scalp and face soft tissues appear negative. MRA NECK FINDINGS Precontrast time-of-flight images demonstrate antegrade flow in both cervical carotid and vertebral arteries. Codominant vertebral arteries. Normal time-of-flight appearance at both carotid bifurcations. Post-contrast neck MRA images reveal a bovine type arch configuration. Proximal great vessels are tortuous, but otherwise within normal limits. Tortuous proximal right CCA with a mildly kinked appearance. Widely patent right carotid bifurcation and cervical right ICA. Tortuous proximal left CCA. Widely patent left carotid bifurcation and cervical left ICA. No proximal subclavian artery stenosis. Vertebral artery origins and cervical vertebral arteries are within normal limits. MRA HEAD FINDINGS Codominant and ectatic distal vertebral arteries without stenosis. Mass effect on the ventral medulla from the tortuous distal left vertebral. Right PICA and dominant appearing left AICA origins are patent. Tortuous basilar artery with mild irregularity but no stenosis. Patent SCA and PCA origins. Posterior communicating arteries are diminutive or absent. Tortuous left P1. There is mild left P2 segment stenosis. The right PCA branches are within normal limits. Antegrade flow in dolichoectatic ICA siphons. No siphon  stenosis. Normal ophthalmic artery origins. Patent carotid termini. Normal MCA and ACA origins. Anterior communicating artery is normal. Visible ACA branches are normal aside from tortuosity. MCA M1 segments are mildly tortuous. Bilateral MCA bifurcations and visible MCA branches are patent without stenosis. IMPRESSION: 1. 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 No associated hemorrhage or mass effect. Normal MRA appearance of the right PCA. 2. Generalized arterial dolichoectasia in the head and neck. No large vessel stenosis. There is mild stenosis of the left PCA P2. 3. Evidence of chronic small vessel disease in the brain, moderate for age, and with occasional chronic micro hemorrhages. Electronically Signed   By: Genevie Ann M.D.   On: 02/13/2018 18:39   Mr Brain Wo Contrast  Result Date: 02/13/2018 CLINICAL DATA:  85 year old male presents with abrupt loss of left eye peripheral vision. No associated pain or  headache. EXAM: MRI HEAD WITHOUT CONTRAST MRA HEAD WITHOUT CONTRAST MRA NECK WITHOUT AND WITH CONTRAST TECHNIQUE: Multiplanar, multiecho pulse sequences of the brain and surrounding structures were obtained without and with intravenous contrast. Angiographic images of the Circle of Willis were obtained using MRA technique without intravenous contrast. Angiographic images of the neck were obtained using MRA technique without and with intravenous contrast. Carotid stenosis measurements (when applicable) are obtained utilizing NASCET criteria, using the distal internal carotid diameter as the denominator. CONTRAST:  9 milliliters Gadavist. COMPARISON:  Head CT without contrast earlier today. FINDINGS: MRI HEAD FINDINGS Brain: Confluent cortical and some subcortical white matter restricted diffusion in the medial right occipital lobe in an area of 3-4 centimeters (series 5, image 78 and coronal image 40). Associated cytotoxic edema with T2 and FLAIR hyperintensity. No acute  hemorrhage or mass effect. No other restricted diffusion. But there is widespread T2 heterogeneity throughout the bilateral deep gray matter nuclei. Much of this appears to represent dilated perivascular spaces, but there are chronic lacunar infarcts of the left corona radiata and posterior lentiform. Similar widespread and patchy bilateral cerebral white matter T2 and FLAIR hyperintensity. Susceptibility weighted images demonstrate a chronic microhemorrhage in each occipital lobe and 1 in the right deep cerebellar nuclei. No cortical encephalomalacia. Normal signal in the brainstem and cerebellum. No midline shift, mass effect, evidence of mass lesion, ventriculomegaly, extra-axial collection or acute intracranial hemorrhage. Cervicomedullary junction and pituitary are within normal limits. Vascular: Major intracranial vascular flow voids are preserved. There is generalized intracranial artery dolichoectasia. And distal left vertebral artery tortuosity results in mild mass effect on the ventral medulla (series 13, image 11). Skull and upper cervical spine: Bulky cervical spine endplate spurring suggesting Diffuse idiopathic skeletal hyperostosis (DISH). Visualized bone marrow signal is within normal limits. Sinuses/Orbits: Optic chiasm and orbits soft tissues are normal; prior cataract surgery. Mild to moderate left maxillary sinus mucosal thickening with a small fluid level. Trace paranasal sinus mucosal thickening elsewhere. Other: Mastoid air cells are clear. Visible internal auditory structures appear normal. Small volume retained secretions in the nasopharynx. Scalp and face soft tissues appear negative. MRA NECK FINDINGS Precontrast time-of-flight images demonstrate antegrade flow in both cervical carotid and vertebral arteries. Codominant vertebral arteries. Normal time-of-flight appearance at both carotid bifurcations. Post-contrast neck MRA images reveal a bovine type arch configuration. Proximal great  vessels are tortuous, but otherwise within normal limits. Tortuous proximal right CCA with a mildly kinked appearance. Widely patent right carotid bifurcation and cervical right ICA. Tortuous proximal left CCA. Widely patent left carotid bifurcation and cervical left ICA. No proximal subclavian artery stenosis. Vertebral artery origins and cervical vertebral arteries are within normal limits. MRA HEAD FINDINGS Codominant and ectatic distal vertebral arteries without stenosis. Mass effect on the ventral medulla from the tortuous distal left vertebral. Right PICA and dominant appearing left AICA origins are patent. Tortuous basilar artery with mild irregularity but no stenosis. Patent SCA and PCA origins. Posterior communicating arteries are diminutive or absent. Tortuous left P1. There is mild left P2 segment stenosis. The right PCA branches are within normal limits. Antegrade flow in dolichoectatic ICA siphons. No siphon stenosis. Normal ophthalmic artery origins. Patent carotid termini. Normal MCA and ACA origins. Anterior communicating artery is normal. Visible ACA branches are normal aside from tortuosity. MCA M1 segments are mildly tortuous. Bilateral MCA bifurcations and visible MCA branches are patent without stenosis. IMPRESSION: 1. 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 No associated hemorrhage or mass  effect. Normal MRA appearance of the right PCA. 2. Generalized arterial dolichoectasia in the head and neck. No large vessel stenosis. There is mild stenosis of the left PCA P2. 3. Evidence of chronic small vessel disease in the brain, moderate for age, and with occasional chronic micro hemorrhages. Electronically Signed   By: Genevie Ann M.D.   On: 02/13/2018 18:39   Mr Virgel Paling JX Contrast  Result Date: 02/13/2018 CLINICAL DATA:  85 year old male presents with abrupt loss of left eye peripheral vision. No associated pain or headache. EXAM: MRI HEAD WITHOUT CONTRAST MRA HEAD  WITHOUT CONTRAST MRA NECK WITHOUT AND WITH CONTRAST TECHNIQUE: Multiplanar, multiecho pulse sequences of the brain and surrounding structures were obtained without and with intravenous contrast. Angiographic images of the Circle of Willis were obtained using MRA technique without intravenous contrast. Angiographic images of the neck were obtained using MRA technique without and with intravenous contrast. Carotid stenosis measurements (when applicable) are obtained utilizing NASCET criteria, using the distal internal carotid diameter as the denominator. CONTRAST:  9 milliliters Gadavist. COMPARISON:  Head CT without contrast earlier today. FINDINGS: MRI HEAD FINDINGS Brain: Confluent cortical and some subcortical white matter restricted diffusion in the medial right occipital lobe in an area of 3-4 centimeters (series 5, image 78 and coronal image 40). Associated cytotoxic edema with T2 and FLAIR hyperintensity. No acute hemorrhage or mass effect. No other restricted diffusion. But there is widespread T2 heterogeneity throughout the bilateral deep gray matter nuclei. Much of this appears to represent dilated perivascular spaces, but there are chronic lacunar infarcts of the left corona radiata and posterior lentiform. Similar widespread and patchy bilateral cerebral white matter T2 and FLAIR hyperintensity. Susceptibility weighted images demonstrate a chronic microhemorrhage in each occipital lobe and 1 in the right deep cerebellar nuclei. No cortical encephalomalacia. Normal signal in the brainstem and cerebellum. No midline shift, mass effect, evidence of mass lesion, ventriculomegaly, extra-axial collection or acute intracranial hemorrhage. Cervicomedullary junction and pituitary are within normal limits. Vascular: Major intracranial vascular flow voids are preserved. There is generalized intracranial artery dolichoectasia. And distal left vertebral artery tortuosity results in mild mass effect on the ventral  medulla (series 13, image 11). Skull and upper cervical spine: Bulky cervical spine endplate spurring suggesting Diffuse idiopathic skeletal hyperostosis (DISH). Visualized bone marrow signal is within normal limits. Sinuses/Orbits: Optic chiasm and orbits soft tissues are normal; prior cataract surgery. Mild to moderate left maxillary sinus mucosal thickening with a small fluid level. Trace paranasal sinus mucosal thickening elsewhere. Other: Mastoid air cells are clear. Visible internal auditory structures appear normal. Small volume retained secretions in the nasopharynx. Scalp and face soft tissues appear negative. MRA NECK FINDINGS Precontrast time-of-flight images demonstrate antegrade flow in both cervical carotid and vertebral arteries. Codominant vertebral arteries. Normal time-of-flight appearance at both carotid bifurcations. Post-contrast neck MRA images reveal a bovine type arch configuration. Proximal great vessels are tortuous, but otherwise within normal limits. Tortuous proximal right CCA with a mildly kinked appearance. Widely patent right carotid bifurcation and cervical right ICA. Tortuous proximal left CCA. Widely patent left carotid bifurcation and cervical left ICA. No proximal subclavian artery stenosis. Vertebral artery origins and cervical vertebral arteries are within normal limits. MRA HEAD FINDINGS Codominant and ectatic distal vertebral arteries without stenosis. Mass effect on the ventral medulla from the tortuous distal left vertebral. Right PICA and dominant appearing left AICA origins are patent. Tortuous basilar artery with mild irregularity but no stenosis. Patent SCA and PCA origins. Posterior communicating arteries  are diminutive or absent. Tortuous left P1. There is mild left P2 segment stenosis. The right PCA branches are within normal limits. Antegrade flow in dolichoectatic ICA siphons. No siphon stenosis. Normal ophthalmic artery origins. Patent carotid termini. Normal MCA  and ACA origins. Anterior communicating artery is normal. Visible ACA branches are normal aside from tortuosity. MCA M1 segments are mildly tortuous. Bilateral MCA bifurcations and visible MCA branches are patent without stenosis. IMPRESSION: 1. 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 No associated hemorrhage or mass effect. Normal MRA appearance of the right PCA. 2. Generalized arterial dolichoectasia in the head and neck. No large vessel stenosis. There is mild stenosis of the left PCA P2. 3. Evidence of chronic small vessel disease in the brain, moderate for age, and with occasional chronic micro hemorrhages. Electronically Signed   By: Genevie Ann M.D.   On: 02/13/2018 18:39   Vas [place] Lower Extremity Venous (dvt)  Result Date: 02/14/2018  Lower Venous Study Indications: Stroke.  Comparison Study: No prior study on file for comparison Performing Technologist: Sharion Dove RVS  Examination Guidelines: A complete evaluation includes B-mode imaging, spectral Doppler, color Doppler, and power Doppler as needed of all accessible portions of each vessel. Bilateral testing is considered an integral part of a complete examination. Limited examinations for reoccurring indications may be performed as noted.  Right Venous Findings: +---------+---------------+---------+-----------+----------+-------+          CompressibilityPhasicitySpontaneityPropertiesSummary +---------+---------------+---------+-----------+----------+-------+ CFV      Full           Yes      Yes                          +---------+---------------+---------+-----------+----------+-------+ SFJ      Full                                                 +---------+---------------+---------+-----------+----------+-------+ FV Prox  Full                                                 +---------+---------------+---------+-----------+----------+-------+ FV Mid   Full                                                  +---------+---------------+---------+-----------+----------+-------+ FV DistalFull                                                 +---------+---------------+---------+-----------+----------+-------+ PFV      Full                                                 +---------+---------------+---------+-----------+----------+-------+ POP      Full           Yes      Yes                          +---------+---------------+---------+-----------+----------+-------+  PTV      Full                                                 +---------+---------------+---------+-----------+----------+-------+ PERO     Full                                                 +---------+---------------+---------+-----------+----------+-------+  Left Venous Findings: +---------+---------------+---------+-----------+----------+-------+          CompressibilityPhasicitySpontaneityPropertiesSummary +---------+---------------+---------+-----------+----------+-------+ CFV      Full           Yes      Yes                          +---------+---------------+---------+-----------+----------+-------+ SFJ      Full                                                 +---------+---------------+---------+-----------+----------+-------+ FV Prox  Full                                                 +---------+---------------+---------+-----------+----------+-------+ FV Mid   Full                                                 +---------+---------------+---------+-----------+----------+-------+ FV DistalFull                                                 +---------+---------------+---------+-----------+----------+-------+ PFV      Full                                                 +---------+---------------+---------+-----------+----------+-------+ POP      Full           Yes      Yes                           +---------+---------------+---------+-----------+----------+-------+ PTV      Full                                                 +---------+---------------+---------+-----------+----------+-------+ PERO     Full                                                 +---------+---------------+---------+-----------+----------+-------+  Summary: Right: There is no evidence of deep vein thrombosis in the lower extremity. Left: There is no evidence of deep vein thrombosis in the lower extremity.  *See table(s) above for measurements and observations.    Preliminary         Scheduled Meds: . aspirin  325 mg Oral Daily  . clopidogrel  75 mg Oral Q breakfast  . diltiazem  240 mg Oral Daily  . enoxaparin (LOVENOX) injection  40 mg Subcutaneous Q24H  . famotidine  20 mg Oral BID  . metoprolol tartrate  25 mg Oral BID  . rosuvastatin  10 mg Oral QPM   Continuous Infusions:   LOS: 0 days    Time spent: 17 min    Nicolette Bang, MD [facility]  If 7PM-7AM, please contact night-coverage  02/14/2018, 3:17 PM

## 2018-02-14 NOTE — Evaluation (Signed)
Speech Language Pathology Evaluation Patient Details Name: Brian Brown MRN: [id number] DOB: 11-Nov-1932 Today's Date: 02/14/2018 Time: 5956-3875 SLP Time Calculation (min) (ACUTE ONLY): 13 min  Problem List:  Patient Active Problem List   Diagnosis Date Noted  . Acute CVA (cerebrovascular accident) ([place]) 02/14/2018  . TIA (transient ischemic attack) 02/13/2018  . Hepatitis C reactive 02/06/2011  . Hyponatremia 02/06/2011  . Postoperative anemia due to acute blood loss 02/05/2011  . Hypokalemia 02/05/2011  . Osteoarthritis of hip 01/27/2011  . Hypertension 01/27/2011  . Sleep apnea 01/27/2011  . Stroke/cerebrovascular accident ([place]) 01/27/2011  . Cancer of prostate ([place]) 01/27/2011   Past Medical History:  Past Medical History:  Diagnosis Date  . Arthritis    degenerative joint disease  . Bleeding stomach ulcer 25 yrs ago  . Cancer [facility]) 2002   prostate, s/p surgery  . Hepatitis 1981   does not know what kind;denies jaundice  . Hypertension    treated by Dr Delfina Redwood, primary MD  . Sleep apnea 2005   does not wear a Cpap  . Stroke ([place]) 2003   w/ Rt sided weakness-some-rt leg  . Wears dentures    full top  . Wears glasses    Past Surgical History:  Past Surgical History:  Procedure Laterality Date  . CARPAL TUNNEL RELEASE Left 03/29/2013   Procedure: LEFT CARPAL TUNNEL RELEASE;  Surgeon: Wynonia Sours, MD;  Location: [place];  Service: Orthopedics;  Laterality: Left;  ANESTHESIA: IV REGIONAL FAB  . CARPAL TUNNEL RELEASE Right 03/06/2014   Procedure: RIGHT CARPAL TUNNEL RELEASE;  Surgeon: Daryll Brod, MD;  Location: [place];  Service: Orthopedics;  Laterality: Right;  . COLONOSCOPY    . HERNIA REPAIR  2009   rt inguinal hernia  . JOINT REPLACEMENT  12/2001   rt hip ;[place]  2002  . SHOULDER ARTHROSCOPY W/ ROTATOR CUFF REPAIR  2014   lt-dr whitfield  . TOTAL HIP ARTHROPLASTY  02/03/2011   Procedure: TOTAL  HIP ARTHROPLASTY;  Surgeon: Garald Balding, MD;  Location: [place];  Service: Orthopedics;  Laterality: Left;  . TRIGGER FINGER RELEASE Left 03/29/2013   Procedure: LEFT MIDDLE/SMALL FINGER RELEASE A-1 PULLEY AND LEFT RING FINGER;  Surgeon: Wynonia Sours, MD;  Location: [place];  Service: Orthopedics;  Laterality: Left;  . TRIGGER FINGER RELEASE Right 03/06/2014   Procedure: RIGHT MIDDLE FINGER,RIGHT RINGER FINGER;  Surgeon: Daryll Brod, MD;  Location: [place];  Service: Orthopedics;  Laterality: Right;   HPI:  Brian Brown is a 85 y.o. male with medical history significant of history of a stroke with right hemiparesis, prostate cancer, sleep apnea currently not wearing CPAP, hypertension who presents to the emergency room with sudden onset of loss of vision on the left side.  According to the patient, he had a stroke in 2010 and since had some weakness on the right leg, however he has been functioning normally.  He occasionally uses cane otherwise independent. MRI positive for acute Right PCA infarct in the medial right   Assessment / Plan / Recommendation Clinical Impression  Pt presents with functional cognitive linguistic abilities as evidenced by orientation x 4, appropriate selective attention, great thought organization, semi-complex problem solving (attempting to set time on wrist watch) and good awareness of deficits. Pt states that his left hemianopsa has improved and he is able to see more in his left visual field. He was able to utilize his  cell phone with no s/s of visual impairments. Pt's speech intelligibility is good. All education provided and ST to sign off at this time.     SLP Assessment  SLP Recommendation/Assessment: Patient does not need any further Speech Lanaguage Pathology Services SLP Visit Diagnosis: Cognitive communication deficit (R41.841)    Follow Up Recommendations  None    Frequency and Duration           SLP  Evaluation Cognition  Overall Cognitive Status: Within Functional Limits for tasks assessed Arousal/Alertness: Awake/alert Orientation Level: Oriented X4       Comprehension  Auditory Comprehension Overall Auditory Comprehension: Appears within functional limits for tasks assessed Visual Recognition/Discrimination Discrimination: Not tested Reading Comprehension Reading Status: Within funtional limits    Expression Expression Primary Mode of Expression: Verbal Verbal Expression Overall Verbal Expression: Appears within functional limits for tasks assessed Written Expression Dominant Hand: Right Written Expression: Not tested   Oral / Motor  Oral Motor/Sensory Function Overall Oral Motor/Sensory Function: Within functional limits Motor Speech Overall Motor Speech: Appears within functional limits for tasks assessed Respiration: Within functional limits Phonation: Normal Articulation: Within functional limitis Intelligibility: Intelligible Motor Planning: Witnin functional limits Motor Speech Errors: Not applicable   GO                    Brian Brown 02/14/2018, 10:10 AM

## 2018-02-14 NOTE — Progress Notes (Signed)
Occupational Therapy Treatment Patient Details Name: Brian Brown MRN: [id number] DOB: Dec 19, 1932 Today's Date: 02/14/2018    History of present illness Pt is an 85 y.o. male with signifcant PMH of stroke with right hemiparesis, prostate cancer, sleep apnea, hypertension who presented to the emergency room with sudden onset of loss of vision on the left side. MRI showed Rt PCA infarct.   OT comments  Discussed pt's deficits, current level of assistance with ADLs and IADLs, need for 24 hour supervision initially, and direct supervision with medication management, financial management, cooking, and the need to have someone with him when out of his house due to risk of injury.  Also discussed recommendation for no driving and OPOT.  They verbalized understanding.  I have discussed the patient's current level of function related to ADLs, vision, and cognition  with the patient and daughters.  They acknowledge understanding of this and feel they can provide the level of care the patient will need at home.      Follow Up Recommendations  Outpatient OT;Supervision/Assistance - 24 hour    Equipment Recommendations  None recommended by OT    Recommendations for Other Services      Precautions / Restrictions Precautions Precautions: None       Mobility Bed Mobility Overal bed mobility: Needs Assistance Bed Mobility: Sit to Supine       Sit to supine: Supervision   General bed mobility comments: Pt attempted to lie upside down in the bed and required cues to correct   Transfers Overall transfer level: Needs assistance Equipment used: None Transfers: Sit to/from Stand;Stand Pivot Transfers Sit to Stand: Supervision Stand pivot transfers: Supervision       General transfer comment: supervision for safety     Balance Overall balance assessment: Mild deficits observed, not formally tested                                         ADL either performed or  assessed with clinical judgement   ADL Overall ADL's : Needs assistance/impaired Eating/Feeding: Independent   Grooming: Wash/dry hands;Wash/dry face;Oral care;Brushing hair;Supervision/safety;Standing   Upper Body Bathing: Supervision/ safety;Sitting   Lower Body Bathing: Supervison/ safety;Sit to/from stand   Upper Body Dressing : Supervision/safety;Sitting   Lower Body Dressing: Supervision/safety;Sit to/from stand   Toilet Transfer: Supervision/safety;Ambulation;Comfort height toilet   Toileting- Clothing Manipulation and Hygiene: Supervision/safety;Sit to/from stand Toileting - Clothing Manipulation Details (indicate cue type and reason): Pt required assist to locate bathroom, and then cues to locate handle to flush      Functional mobility during ADLs: Supervision/safety;Min guard General ADL Comments: as pt fatigued, he required increased assistance      Vision Baseline Vision/History: Wears glasses Wears Glasses: At all times Patient Visual Report: Peripheral vision impairment Vision Assessment?: Yes Eye Alignment: Within Functional Limits Ocular Range of Motion: Within Functional Limits Alignment/Gaze Preference: Within Defined Limits Tracking/Visual Pursuits: Other (comment) Visual Fields: Left homonymous hemianopsia Additional Comments: Pt looses object in Rt inferior quadrant and on the Lt during pursuits.   Frequently attempts to turn head during pursuits.  Pt had signficant difficulty with reading.  He frequently started reading mid way through sentence, and then reported that the lines moved and separated on him    Perception Perception Perception Tested?: Yes   Praxis Praxis Praxis tested?: Within functional limits    Cognition Arousal/Alertness: Awake/alert Behavior During Therapy: [facility] for  tasks assessed/performed Overall Cognitive Status: Impaired/Different from baseline Area of Impairment: Orientation;Attention;Memory;Awareness;Problem solving                  Orientation Level: Disoriented to;Place(intermittently disoriented ) Current Attention Level: Alternating Memory: Decreased short-term memory     Awareness: Emergent Problem Solving: Requires verbal cues General Comments: Pt initially found walking in the hallway looking for a bathroom because he couldn't find one in his apartment, and several strange people were in there.  Reoriented pt to hospital, and helped him back to his room.  No one present in his room.  He also later thought he was in a hotel.  But other times able to state he is at [place].  He struggled with serial seven's backwards from 100, but on the third round did much better, but processing time was slow.  He is aware that visual deficits caused him to come into hospital, but states vision has returned despite dense [facility]         Exercises     Shoulder Instructions       General Comments Discussed pt's deficits with pt and daughters.  Discussed need to have 24 hour supervision initially, to maintain uncluttered environment, need for direct supervision with cooking, medication and financial management, and when outside of his home which is familiar.   Also reinforced no driving.  They verbalized understanding of all and all questions answered     Pertinent Vitals/ Pain       Pain Assessment: No/denies pain  Home Living Family/patient expects to be discharged to:: Private residence Living Arrangements: Alone Available Help at Discharge: Family;Available 24 hours/day Type of Home: House Home Access: Stairs to enter [employer] of Steps: 2 Entrance Stairs-Rails: Can reach both Home Layout: One level     Bathroom Shower/Tub: Teacher, early years/pre: Standard     Home Equipment: Environmental consultant - 2 wheels;Bedside commode   Additional Comments: Pt lives alone, with local supportive extended family.  He has three daughters, who live in [place], and per nephew are in transit and will  provide necessary level of assist at discharge       Prior Functioning/Environment Level of Independence: Independent        Comments: Pt drives, and was independent in community.  He has been seeing PT for back pain.  He is a retired Manufacturing engineer 2X/week        Progress Toward Goals  OT Goals(current goals can now be found in the care plan section)  Progress towards OT goals: Progressing toward goals  Acute Rehab OT Goals Patient Stated Goal: go home OT Goal Formulation: With patient Time For Goal Achievement: 02/28/18 Potential to Achieve Goals: Good ADL Goals Pt Will Perform Grooming: with modified independence;standing Pt Will Perform Upper Body Bathing: with modified independence;sitting Pt Will Perform Lower Body Bathing: with modified independence;sit to/from stand Pt Will Perform Upper Body Dressing: with modified independence;sitting Pt Will Perform Lower Body Dressing: with modified independence;sit to/from stand Pt Will Transfer to Toilet: with modified independence;ambulating;regular height toilet Pt Will Perform Toileting - Clothing Manipulation and hygiene: with modified independence;sit to/from stand Additional ADL Goal #1: Pt will verbalize understanding of visual deficits and impact on function Additional ADL Goal #2: Pt will locate items on Lt with min cues during path finding activtiy in hospital  Plan Discharge plan remains appropriate    Co-evaluation  AM-PAC OT "6 Clicks" Daily Activity     Outcome Measure   Help from another person eating meals?: None Help from another person taking care of personal grooming?: A Little Help from another person toileting, which includes using toliet, bedpan, or urinal?: A Little Help from another person bathing (including washing, rinsing, drying)?: A Little Help from another person to put on and taking off regular upper body clothing?: A  Little Help from another person to put on and taking off regular lower body clothing?: A Little 6 Click Score: 19    End of Session Equipment Utilized During Treatment: Gait belt  OT Visit Diagnosis: Cognitive communication deficit (R41.841);Low vision, both eyes (H54.2) Symptoms and signs involving cognitive functions: Cerebral infarction   Activity Tolerance Patient tolerated treatment well   Patient Left in chair;with call bell/phone within reach;with chair alarm set;with family/visitor present   Nurse Communication Mobility status        Time: 3546-5681 OT Time Calculation (min): 16 min  Charges: OT General Charges $OT Visit: 1 Visit OT Evaluation $OT Eval Moderate Complexity: 1 Mod OT Treatments $Self Care/Home Management : 8-22 mins $Therapeutic Activity: 23-37 mins  Lucille Passy, OTR/L Acute Rehabilitation Services Pager [phone] Office [place], [place] 02/14/2018, 6:57 PM

## 2018-02-14 NOTE — Progress Notes (Signed)
PT Cancellation Note  Patient Details Name: Brian Brown MRN: [id number] DOB: [birth year]   Cancelled Treatment:    Reason Eval/Treat Not Completed: Patient at procedure or test/unavailable attempted to work with patient, he was not in his room and thus not available to participate in PT eval. Will attempt to try back later if time/schedule allow.    Deniece Ree PT, DPT, CBIS  Supplemental Physical Therapist [facility]    Pager [phone] Acute Rehab Office [phone]

## 2018-02-14 NOTE — Progress Notes (Signed)
VASCULAR LAB PRELIMINARY  PRELIMINARY  PRELIMINARY  PRELIMINARY  Bilateral lower extremity venous duplex completed.    Preliminary report:  See CV Proc   Kla Bily, RVT 02/14/2018, 11:18 AM

## 2018-02-14 NOTE — Progress Notes (Addendum)
STROKE TEAM PROGRESS NOTE   INTERVAL HISTORY His nephew is at the bedside.  Pts daughters are on their way here from [place], they will be here this afternoon. Pt just worked with PT and OT - so far they are recommended OP followup and 24h supervision, no driving.   Vitals:   02/13/18 2306 02/14/18 0300 02/14/18 0451 02/14/18 0739  BP: 122/71  138/85   Pulse:   (!) 53   Resp:   18   Temp: (!) 97.5 F (36.4 C) 97.8 F (36.6 C)  98.4 F (36.9 C)  TempSrc: Oral Oral  Oral  SpO2:   98%     CBC:  Recent Labs  Lab 02/13/18 0815  WBC 6.2  NEUTROABS 4.1  HGB 12.7*  HCT 40.2  MCV 89.3  PLT 401    Basic Metabolic Panel:  Recent Labs  Lab 02/13/18 0815  NA 140  K 3.4*  CL 105  CO2 27  GLUCOSE 95  BUN 12  CREATININE 1.28*  CALCIUM 9.0   Lipid Panel:     Component Value Date/Time   CHOL 117 02/14/2018 0303   TRIG 48 02/14/2018 0303   HDL 45 02/14/2018 0303   CHOLHDL 2.6 02/14/2018 0303   VLDL 10 02/14/2018 0303   LDLCALC 62 02/14/2018 0303   HgbA1c:  Lab Results  Component Value Date   HGBA1C 5.8 (H) 02/14/2018   Urine Drug Screen:     Component Value Date/Time   LABOPIA NONE DETECTED 02/13/2018 0914   COCAINSCRNUR NONE DETECTED 02/13/2018 0914   LABBENZ NONE DETECTED 02/13/2018 0914   AMPHETMU NONE DETECTED 02/13/2018 0914   THCU NONE DETECTED 02/13/2018 0914   LABBARB NONE DETECTED 02/13/2018 0914    Alcohol Level     Component Value Date/Time   ETH <10 02/13/2018 0815    IMAGING Ct Head Wo Contrast  Result Date: 02/13/2018 CLINICAL DATA:  Vision loss in left eye. EXAM: CT HEAD WITHOUT CONTRAST TECHNIQUE: Contiguous axial images were obtained from the base of the skull through the vertex without intravenous contrast. COMPARISON:  None. FINDINGS: Brain: Mild age related cerebral atrophy but no significant ventriculomegaly. There is moderate periventricular white matter disease. No findings for acute hemispheric infarction or intracranial hemorrhage.  The brainstem and cerebellum are grossly normal. No mass lesions are identified. No abnormalities involving the sella or parasellar regions. The optic nerves appear grossly normal. Vascular: Minimal vascular calcifications but no aneurysm or hyperdense vessels. Skull: No skull fracture or bone lesions. Sinuses/Orbits: Small amount of fluid and mucoperiosteal thickening involving the left maxillary sinus. Mild mucoperiosteal thickening involving the right maxillary sinus. Scattered ethmoid sinus disease. Both halves of the sphenoid sinus are clear. The mastoid air cells and middle ear cavities are clear. Other: No scalp lesions or hematoma. IMPRESSION: 1. Age related cerebral atrophy and periventricular white matter disease. 2. No acute intracranial findings or mass lesions. 3. Scattered sinus disease. Electronically Signed   By: Marijo Sanes M.D.   On: 02/13/2018 09:21   Mr Jodene Nam Neck W Wo Contrast  Result Date: 02/13/2018 CLINICAL DATA:  85 year old male presents with abrupt loss of left eye peripheral vision. No associated pain or headache. EXAM: MRI HEAD WITHOUT CONTRAST MRA HEAD WITHOUT CONTRAST MRA NECK WITHOUT AND WITH CONTRAST TECHNIQUE: Multiplanar, multiecho pulse sequences of the brain and surrounding structures were obtained without and with intravenous contrast. Angiographic images of the Circle of Willis were obtained using MRA technique without intravenous contrast. Angiographic images of the neck were obtained  using MRA technique without and with intravenous contrast. Carotid stenosis measurements (when applicable) are obtained utilizing NASCET criteria, using the distal internal carotid diameter as the denominator. CONTRAST:  9 milliliters Gadavist. COMPARISON:  Head CT without contrast earlier today. FINDINGS: MRI HEAD FINDINGS Brain: Confluent cortical and some subcortical white matter restricted diffusion in the medial right occipital lobe in an area of 3-4 centimeters (series 5, image 78 and  coronal image 40). Associated cytotoxic edema with T2 and FLAIR hyperintensity. No acute hemorrhage or mass effect. No other restricted diffusion. But there is widespread T2 heterogeneity throughout the bilateral deep gray matter nuclei. Much of this appears to represent dilated perivascular spaces, but there are chronic lacunar infarcts of the left corona radiata and posterior lentiform. Similar widespread and patchy bilateral cerebral white matter T2 and FLAIR hyperintensity. Susceptibility weighted images demonstrate a chronic microhemorrhage in each occipital lobe and 1 in the right deep cerebellar nuclei. No cortical encephalomalacia. Normal signal in the brainstem and cerebellum. No midline shift, mass effect, evidence of mass lesion, ventriculomegaly, extra-axial collection or acute intracranial hemorrhage. Cervicomedullary junction and pituitary are within normal limits. Vascular: Major intracranial vascular flow voids are preserved. There is generalized intracranial artery dolichoectasia. And distal left vertebral artery tortuosity results in mild mass effect on the ventral medulla (series 13, image 11). Skull and upper cervical spine: Bulky cervical spine endplate spurring suggesting Diffuse idiopathic skeletal hyperostosis (DISH). Visualized bone marrow signal is within normal limits. Sinuses/Orbits: Optic chiasm and orbits soft tissues are normal; prior cataract surgery. Mild to moderate left maxillary sinus mucosal thickening with a small fluid level. Trace paranasal sinus mucosal thickening elsewhere. Other: Mastoid air cells are clear. Visible internal auditory structures appear normal. Small volume retained secretions in the nasopharynx. Scalp and face soft tissues appear negative. MRA NECK FINDINGS Precontrast time-of-flight images demonstrate antegrade flow in both cervical carotid and vertebral arteries. Codominant vertebral arteries. Normal time-of-flight appearance at both carotid bifurcations.  Post-contrast neck MRA images reveal a bovine type arch configuration. Proximal great vessels are tortuous, but otherwise within normal limits. Tortuous proximal right CCA with a mildly kinked appearance. Widely patent right carotid bifurcation and cervical right ICA. Tortuous proximal left CCA. Widely patent left carotid bifurcation and cervical left ICA. No proximal subclavian artery stenosis. Vertebral artery origins and cervical vertebral arteries are within normal limits. MRA HEAD FINDINGS Codominant and ectatic distal vertebral arteries without stenosis. Mass effect on the ventral medulla from the tortuous distal left vertebral. Right PICA and dominant appearing left AICA origins are patent. Tortuous basilar artery with mild irregularity but no stenosis. Patent SCA and PCA origins. Posterior communicating arteries are diminutive or absent. Tortuous left P1. There is mild left P2 segment stenosis. The right PCA branches are within normal limits. Antegrade flow in dolichoectatic ICA siphons. No siphon stenosis. Normal ophthalmic artery origins. Patent carotid termini. Normal MCA and ACA origins. Anterior communicating artery is normal. Visible ACA branches are normal aside from tortuosity. MCA M1 segments are mildly tortuous. Bilateral MCA bifurcations and visible MCA branches are patent without stenosis. IMPRESSION: 1. 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 No associated hemorrhage or mass effect. Normal MRA appearance of the right PCA. 2. Generalized arterial dolichoectasia in the head and neck. No large vessel stenosis. There is mild stenosis of the left PCA P2. 3. Evidence of chronic small vessel disease in the brain, moderate for age, and with occasional chronic micro hemorrhages. Electronically Signed   By: Lemmie Evens  [place] Crane M.D.   On: 02/13/2018 18:39   Mr Brain Wo Contrast  Result Date: 02/13/2018 CLINICAL DATA:  85 year old male presents with abrupt loss of left eye peripheral  vision. No associated pain or headache. EXAM: MRI HEAD WITHOUT CONTRAST MRA HEAD WITHOUT CONTRAST MRA NECK WITHOUT AND WITH CONTRAST TECHNIQUE: Multiplanar, multiecho pulse sequences of the brain and surrounding structures were obtained without and with intravenous contrast. Angiographic images of the Circle of Willis were obtained using MRA technique without intravenous contrast. Angiographic images of the neck were obtained using MRA technique without and with intravenous contrast. Carotid stenosis measurements (when applicable) are obtained utilizing NASCET criteria, using the distal internal carotid diameter as the denominator. CONTRAST:  9 milliliters Gadavist. COMPARISON:  Head CT without contrast earlier today. FINDINGS: MRI HEAD FINDINGS Brain: Confluent cortical and some subcortical white matter restricted diffusion in the medial right occipital lobe in an area of 3-4 centimeters (series 5, image 78 and coronal image 40). Associated cytotoxic edema with T2 and FLAIR hyperintensity. No acute hemorrhage or mass effect. No other restricted diffusion. But there is widespread T2 heterogeneity throughout the bilateral deep gray matter nuclei. Much of this appears to represent dilated perivascular spaces, but there are chronic lacunar infarcts of the left corona radiata and posterior lentiform. Similar widespread and patchy bilateral cerebral white matter T2 and FLAIR hyperintensity. Susceptibility weighted images demonstrate a chronic microhemorrhage in each occipital lobe and 1 in the right deep cerebellar nuclei. No cortical encephalomalacia. Normal signal in the brainstem and cerebellum. No midline shift, mass effect, evidence of mass lesion, ventriculomegaly, extra-axial collection or acute intracranial hemorrhage. Cervicomedullary junction and pituitary are within normal limits. Vascular: Major intracranial vascular flow voids are preserved. There is generalized intracranial artery dolichoectasia. And distal  left vertebral artery tortuosity results in mild mass effect on the ventral medulla (series 13, image 11). Skull and upper cervical spine: Bulky cervical spine endplate spurring suggesting Diffuse idiopathic skeletal hyperostosis (DISH). Visualized bone marrow signal is within normal limits. Sinuses/Orbits: Optic chiasm and orbits soft tissues are normal; prior cataract surgery. Mild to moderate left maxillary sinus mucosal thickening with a small fluid level. Trace paranasal sinus mucosal thickening elsewhere. Other: Mastoid air cells are clear. Visible internal auditory structures appear normal. Small volume retained secretions in the nasopharynx. Scalp and face soft tissues appear negative. MRA NECK FINDINGS Precontrast time-of-flight images demonstrate antegrade flow in both cervical carotid and vertebral arteries. Codominant vertebral arteries. Normal time-of-flight appearance at both carotid bifurcations. Post-contrast neck MRA images reveal a bovine type arch configuration. Proximal great vessels are tortuous, but otherwise within normal limits. Tortuous proximal right CCA with a mildly kinked appearance. Widely patent right carotid bifurcation and cervical right ICA. Tortuous proximal left CCA. Widely patent left carotid bifurcation and cervical left ICA. No proximal subclavian artery stenosis. Vertebral artery origins and cervical vertebral arteries are within normal limits. MRA HEAD FINDINGS Codominant and ectatic distal vertebral arteries without stenosis. Mass effect on the ventral medulla from the tortuous distal left vertebral. Right PICA and dominant appearing left AICA origins are patent. Tortuous basilar artery with mild irregularity but no stenosis. Patent SCA and PCA origins. Posterior communicating arteries are diminutive or absent. Tortuous left P1. There is mild left P2 segment stenosis. The right PCA branches are within normal limits. Antegrade flow in dolichoectatic ICA siphons. No siphon  stenosis. Normal ophthalmic artery origins. Patent carotid termini. Normal MCA and ACA origins. Anterior communicating artery is normal. Visible ACA branches are normal aside from tortuosity.  MCA M1 segments are mildly tortuous. Bilateral MCA bifurcations and visible MCA branches are patent without stenosis. IMPRESSION: 1. 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 No associated hemorrhage or mass effect. Normal MRA appearance of the right PCA. 2. Generalized arterial dolichoectasia in the head and neck. No large vessel stenosis. There is mild stenosis of the left PCA P2. 3. Evidence of chronic small vessel disease in the brain, moderate for age, and with occasional chronic micro hemorrhages. Electronically Signed   By: Genevie Ann M.D.   On: 02/13/2018 18:39   Mr Virgel Paling JI Contrast  Result Date: 02/13/2018 CLINICAL DATA:  85 year old male presents with abrupt loss of left eye peripheral vision. No associated pain or headache. EXAM: MRI HEAD WITHOUT CONTRAST MRA HEAD WITHOUT CONTRAST MRA NECK WITHOUT AND WITH CONTRAST TECHNIQUE: Multiplanar, multiecho pulse sequences of the brain and surrounding structures were obtained without and with intravenous contrast. Angiographic images of the Circle of Willis were obtained using MRA technique without intravenous contrast. Angiographic images of the neck were obtained using MRA technique without and with intravenous contrast. Carotid stenosis measurements (when applicable) are obtained utilizing NASCET criteria, using the distal internal carotid diameter as the denominator. CONTRAST:  9 milliliters Gadavist. COMPARISON:  Head CT without contrast earlier today. FINDINGS: MRI HEAD FINDINGS Brain: Confluent cortical and some subcortical white matter restricted diffusion in the medial right occipital lobe in an area of 3-4 centimeters (series 5, image 78 and coronal image 40). Associated cytotoxic edema with T2 and FLAIR hyperintensity. No acute  hemorrhage or mass effect. No other restricted diffusion. But there is widespread T2 heterogeneity throughout the bilateral deep gray matter nuclei. Much of this appears to represent dilated perivascular spaces, but there are chronic lacunar infarcts of the left corona radiata and posterior lentiform. Similar widespread and patchy bilateral cerebral white matter T2 and FLAIR hyperintensity. Susceptibility weighted images demonstrate a chronic microhemorrhage in each occipital lobe and 1 in the right deep cerebellar nuclei. No cortical encephalomalacia. Normal signal in the brainstem and cerebellum. No midline shift, mass effect, evidence of mass lesion, ventriculomegaly, extra-axial collection or acute intracranial hemorrhage. Cervicomedullary junction and pituitary are within normal limits. Vascular: Major intracranial vascular flow voids are preserved. There is generalized intracranial artery dolichoectasia. And distal left vertebral artery tortuosity results in mild mass effect on the ventral medulla (series 13, image 11). Skull and upper cervical spine: Bulky cervical spine endplate spurring suggesting Diffuse idiopathic skeletal hyperostosis (DISH). Visualized bone marrow signal is within normal limits. Sinuses/Orbits: Optic chiasm and orbits soft tissues are normal; prior cataract surgery. Mild to moderate left maxillary sinus mucosal thickening with a small fluid level. Trace paranasal sinus mucosal thickening elsewhere. Other: Mastoid air cells are clear. Visible internal auditory structures appear normal. Small volume retained secretions in the nasopharynx. Scalp and face soft tissues appear negative. MRA NECK FINDINGS Precontrast time-of-flight images demonstrate antegrade flow in both cervical carotid and vertebral arteries. Codominant vertebral arteries. Normal time-of-flight appearance at both carotid bifurcations. Post-contrast neck MRA images reveal a bovine type arch configuration. Proximal great  vessels are tortuous, but otherwise within normal limits. Tortuous proximal right CCA with a mildly kinked appearance. Widely patent right carotid bifurcation and cervical right ICA. Tortuous proximal left CCA. Widely patent left carotid bifurcation and cervical left ICA. No proximal subclavian artery stenosis. Vertebral artery origins and cervical vertebral arteries are within normal limits. MRA HEAD FINDINGS Codominant and ectatic distal vertebral arteries without stenosis. Mass effect on  the ventral medulla from the tortuous distal left vertebral. Right PICA and dominant appearing left AICA origins are patent. Tortuous basilar artery with mild irregularity but no stenosis. Patent SCA and PCA origins. Posterior communicating arteries are diminutive or absent. Tortuous left P1. There is mild left P2 segment stenosis. The right PCA branches are within normal limits. Antegrade flow in dolichoectatic ICA siphons. No siphon stenosis. Normal ophthalmic artery origins. Patent carotid termini. Normal MCA and ACA origins. Anterior communicating artery is normal. Visible ACA branches are normal aside from tortuosity. MCA M1 segments are mildly tortuous. Bilateral MCA bifurcations and visible MCA branches are patent without stenosis. IMPRESSION: 1. 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 No associated hemorrhage or mass effect. Normal MRA appearance of the right PCA. 2. Generalized arterial dolichoectasia in the head and neck. No large vessel stenosis. There is mild stenosis of the left PCA P2. 3. Evidence of chronic small vessel disease in the brain, moderate for age, and with occasional chronic micro hemorrhages. Electronically Signed   By: Genevie Ann M.D.   On: 02/13/2018 18:39   LE Doppler Right: There is no evidence of deep vein thrombosis in the lower extremity. Left: There is no evidence of deep vein thrombosis in the lower extremity.   2D Echocardiogram  pending    PHYSICAL  EXAM Constitutional: Appears well-developed and well-nourished.  Psych: Affect appropriate to situation Eyes: No scleral injection HENT: No OP obstrucion Head: Normocephalic.  Cardiovascular: Normal rate and regular rhythm.  Respiratory: Effort normal, non-labored breathing Skin: WDI  Neuro: Mental Status: Patient is lethargic after working with therapies, requiring stimulation to stay awake. once awake, oriented to person, place, month, year, and situation. Patient is able to give a clear and coherent history. Confusion noted during therapy eval but not during my eval. No signs of aphasia or neglect Cranial Nerves: II: Visual Fields with dense left hemianopia. Pupils are equal, round, and reactive to light.   III,IV, VI: EOMI without ptosis or diploplia.  V: Facial sensation is symmetric to temperature VII: mild L facial weakness  VIII: hearing is intact to voice X: Uvula elevates symmetrically XI: Shoulder shrug is symmetric. XII: tongue is midline without atrophy or fasciculations.  Motor: Tone is normal. Bulk is normal. 5/5 strength was present in bilateral arms,?  5-/5 in the right leg, but could be effort dependent. Sensory: Sensation is symmetric to light touch and temperature in the arms and legs. Cerebellar: He has mild intentional tremor on finger-nose-finger bilaterally Gait: Slow but steady   ASSESSMENT/PLAN Mr. DASHAUN ONSTOTT is a 85 y.o. male with history of HTN and CVA presenting with visual change (L hemianopia).   Stroke:  right PCA infarct, embolic, source unknown  CT head age related atrophy. Scattered sinus disease. No acute stroke.   MRI  R PCA infarct medial R occipital lobe. Small vessel disease.   MRA  Normal  MRA neck generalized dolichoectasia. Mild stenosis L P2.  2D Echo  pending   LE doppler negative TEE to look for embolic source. Please arrange with pts cardiologist or cardiologist of choice. Ok to do as an OP if medically ready for  d/c If TEE neg, recommend OP EP evaluation for loop recorder placement to look for AF as source of stroke - alert cardiology of this plan when TEE requested and they will address together.  LDL 62  HgbA1c 5.8  Lovenox 40 mg sq daily for VTE prophylaxis  clopidogrel 75 mg  daily prior to admission, now on aspirin 81 mg daily and clopidogrel 75 mg daily. Given  mild stroke, recommend aspirin 81 mg and plavix 75 mg daily x 3 weeks, then PLAVIX alone.   Therapy recommendations:  pending  Disposition:  pending   Hypertension  Stable . Permissive hypertension (OK if < 220/120) but gradually normalize in 5-7 days . Long-term BP goal normotensive  Hyperlipidemia  Home meds:  crestor 10, resumed in hospital  LDL 62, goal < 70  Continue statin at discharge  Other Stroke Risk Factors  Advanced age  Former Cigarette smoker, quit 10 yrs ago  Hx stroke/TIA - 2003 details not available in EPIC  Obstructive sleep apnea, does NOT wear CPAP at home (minimal sx, does not like machine)  Hospital day # 0  Brian Sabin, Brian Brown, Brian Brown, Brian Brown, Brian Brown Advanced Practice Stroke Nurse [place] for Schedule & Pager information 02/14/2018 10:46 AM   ATTENDING NOTE: I reviewed above note and agree with the assessment and plan. Pt was seen and examined.   85 year old male with history of prostate cancer, hypertension, OSA and stroke in 2003 admitted for left hemianopia.  CT no acute finding.  MRI showed right PCA infarct.  MRA head and neck showed left P2 stenosis but right PCA patent.  EF 60 to 65%, left atrial size normal.  LE venous Doppler negative for DVT.  A1c 5.8 and LDL 62.  Patient stroke concerning for cardioembolic source.  Recommend outpatient TEE and loop recorder for further cardioembolic work-up.  Currently on aspirin 81 and Plavix 75.  Continue DAPT for 3 weeks and then Plavix alone.  Continue Crestor 10 for stroke prevention.  Neurology will sign off.  Please call with questions. Pt will follow up with stroke clinic NP at [facility] in about 4 weeks. Thanks for the consult.  Brian Hawking, MD PhD Stroke Neurology 02/14/2018 6:23 PM     To contact Stroke Continuity provider, please refer to [email or web address] After hours, contact General Neurology

## 2018-02-14 NOTE — Evaluation (Addendum)
Occupational Therapy Evaluation Patient Details Name: Brian Brown MRN: [id number] DOB: January 19, 1932 Today's Date: 02/14/2018    History of Present Illness Pt is an 85 y.o. male with signifcant PMH of stroke with right hemiparesis, prostate cancer, sleep apnea, hypertension who presented to the emergency room with sudden onset of loss of vision on the left side. MRI showed Rt PCA infarct.   Clinical Impression   Pt admitted with above. He demonstrates the below listed deficits and will benefit from continued OT to maximize safety and independence with BADLs.  Pt presents to OT with Lt homonomous hemianopsia, Impaired visual scanning, impaired cognition including awareness, fluctuating orientation, and impaired problem solving.  He currently requires supervision - min guard assist for ADLs.  He lives alone, and was fully independent, including driving.  He is a retired Administrator, arts.  Per, pt's nephew, pt's daughters are on their was to [facility] from [place], and will be able to provide 24 hour assist/supervision.       Follow Up Recommendations  Outpatient OT;Supervision/Assistance - 24 hour    Equipment Recommendations  None recommended by OT    Recommendations for Other Services       Precautions / Restrictions Precautions Precautions: None Restrictions Weight Bearing Restrictions: No      Mobility Bed Mobility Overal bed mobility: Needs Assistance Bed Mobility: Sit to Supine       Sit to supine: Supervision   General bed mobility comments: Pt attempted to lie upside down in the bed and required cues to correct   Transfers Overall transfer level: Needs assistance Equipment used: None Transfers: Sit to/from Stand;Stand Pivot Transfers Sit to Stand: Supervision Stand pivot transfers: Supervision       General transfer comment: supervision for safety     Balance Overall balance assessment: Mild deficits observed, not formally tested                                          ADL either performed or assessed with clinical judgement   ADL Overall ADL's : Needs assistance/impaired Eating/Feeding: Independent   Grooming: Wash/dry hands;Wash/dry face;Oral care;Brushing hair;Supervision/safety;Standing   Upper Body Bathing: Supervision/ safety;Sitting   Lower Body Bathing: Supervison/ safety;Sit to/from stand   Upper Body Dressing : Supervision/safety;Sitting   Lower Body Dressing: Supervision/safety;Sit to/from stand   Toilet Transfer: Supervision/safety;Ambulation;Comfort height toilet   Toileting- Clothing Manipulation and Hygiene: Supervision/safety;Sit to/from stand Toileting - Clothing Manipulation Details (indicate cue type and reason): Pt required assist to locate bathroom, and then cues to locate handle to flush      Functional mobility during ADLs: Supervision/safety;Min guard General ADL Comments: as pt fatigued, he required increased assistance      Vision Baseline Vision/History: Wears glasses Wears Glasses: At all times Patient Visual Report: Peripheral vision impairment Vision Assessment?: Yes Eye Alignment: Within Functional Limits Ocular Range of Motion: Within Functional Limits Alignment/Gaze Preference: Within Defined Limits Tracking/Visual Pursuits: Other (comment) Visual Fields: Left homonymous hemianopsia Additional Comments: Pt looses object in Rt inferior quadrant and on the Lt during pursuits.   Frequently attempts to turn head during pursuits.  Pt had signficant difficulty with reading.  He frequently started reading mid way through sentence, and then reported that the lines moved and separated on him      Perception Perception Perception Tested?: Yes   Praxis Praxis Praxis tested?: Within functional limits    Pertinent  Vitals/Pain Pain Assessment: No/denies pain     Hand Dominance Right   Extremity/Trunk Assessment Upper Extremity Assessment Upper Extremity Assessment: Overall  WFL for tasks assessed   Lower Extremity Assessment Lower Extremity Assessment: Defer to PT evaluation RLE Deficits / Details: states that his RLE is his "weak side" from previous stroke. Hip flex, plantar flex, dorsiflexion 4/5. Knee flex/ext 4+/5 RLE Sensation: (difficult to assess due to pt falling asleep) RLE Coordination: WNL   Cervical / Trunk Assessment Cervical / Trunk Assessment: Normal   Communication Communication Communication: No difficulties   Cognition Arousal/Alertness: Awake/alert Behavior During Therapy: WFL for tasks assessed/performed Overall Cognitive Status: Impaired/Different from baseline Area of Impairment: Orientation;Attention;Memory;Awareness;Problem solving                 Orientation Level: Disoriented to;Place(intermittently disoriented ) Current Attention Level: Alternating Memory: Decreased short-term memory     Awareness: Emergent Problem Solving: Requires verbal cues General Comments: Pt initially found walking in the hallway looking for a bathroom because he couldn't find one in his apartment, and several strange people were in there.  Reoriented pt to hospital, and helped him back to his room.  No one present in his room.  He also later thought he was in a hotel.  But other times able to state he is at [place].  He struggled with serial seven's backwards from 100, but on the third round did much better, but processing time was slow.  He is aware that visual deficits caused him to come into hospital, but states vision has returned despite dense Lt HH    General Comments  Pt instructed in nature of his visual and cognitive deficits and their impact on his function.  Recommend he had 24 hour supervision initially, and direct supervision with medication management, cooking, finances.  Also recommend that he not drive.  He verbalized understanding and asked appropriate questions.  Nephew present and reports pt's daughters are on their way to the  hospital form [place]     Exercises     Shoulder Instructions      Home Living Family/patient expects to be discharged to:: Private residence Living Arrangements: Alone Available Help at Discharge: Family;Available 24 hours/day Type of Home: House Home Access: Stairs to enter [employer] of Steps: 2 Entrance Stairs-Rails: Can reach both Home Layout: One level     Bathroom Shower/Tub: Teacher, early years/pre: Standard     Home Equipment: Environmental consultant - 2 wheels;Bedside commode   Additional Comments: Pt lives alone, with local supportive extended family.  He has three daughters, who live in [place], and per nephew are in transit and will provide necessary level of assist at discharge   Lives With: Alone    Prior Functioning/Environment Level of Independence: Independent        Comments: Pt drives, and was independent in community.  He has been seeing PT for back pain.  He is a retired Sports coach Problem List: Decreased activity tolerance;Impaired balance (sitting and/or standing);Decreased cognition;Decreased safety awareness;Impaired vision/perception      OT Treatment/Interventions: Self-care/ADL training;Therapeutic activities;Cognitive remediation/compensation;Visual/perceptual remediation/compensation    OT Goals(Current goals can be found in the care plan section) Acute Rehab OT Goals Patient Stated Goal: go home OT Goal Formulation: With patient Time For Goal Achievement: 02/28/18 Potential to Achieve Goals: Good ADL Goals Pt Will Perform Grooming: with modified independence;standing Pt Will Perform Upper Body Bathing: with modified independence;sitting Pt  Will Perform Lower Body Bathing: with modified independence;sit to/from stand Pt Will Perform Upper Body Dressing: with modified independence;sitting Pt Will Perform Lower Body Dressing: with modified independence;sit to/from stand Pt Will Transfer to  Toilet: with modified independence;ambulating;regular height toilet Pt Will Perform Toileting - Clothing Manipulation and hygiene: with modified independence;sit to/from stand Additional ADL Goal #1: Pt will verbalize understanding of visual deficits and impact on function Additional ADL Goal #2: Pt will locate items on Lt with min cues during path finding activtiy in hospital  OT Frequency: Min 2X/week   Barriers to D/C:            Co-evaluation              AM-PAC OT "6 Clicks" Daily Activity     Outcome Measure Help from another person eating meals?: None Help from another person taking care of personal grooming?: A Little Help from another person toileting, which includes using toliet, bedpan, or urinal?: A Little Help from another person bathing (including washing, rinsing, drying)?: A Little Help from another person to put on and taking off regular upper body clothing?: A Little Help from another person to put on and taking off regular lower body clothing?: A Little 6 Click Score: 19   End of Session Equipment Utilized During Treatment: Gait belt Nurse Communication: Mobility status  Activity Tolerance: Patient tolerated treatment well Patient left: in bed;with call bell/phone within reach  OT Visit Diagnosis: Cognitive communication deficit (R41.841);Low vision, both eyes (H54.2) Symptoms and signs involving cognitive functions: Cerebral infarction                Time: 1572-6203 OT Time Calculation (min): 76 min Charges:  OT General Charges $OT Visit: 1 Visit OT Evaluation $OT Eval Moderate Complexity: 1 Mod OT Treatments $Self Care/Home Management : 23-37 mins $Therapeutic Activity: 23-37 mins  Lucille Passy, OTR/L Acute Rehabilitation Services Pager [phone] Office [place], [place] 02/14/2018, 4:41 PM

## 2018-02-14 NOTE — Evaluation (Signed)
Physical Therapy Evaluation Patient Details Name: Brian Brown MRN: [id number] DOB: October 17, 1932 Today's Date: 02/14/2018   History of Present Illness  Pt is an 85 y.o. male with signifcant PMH of stroke with right hemiparesis, prostate cancer, sleep apnea, hypertension who presented to the emergency room with sudden onset of loss of vision on the left side.   Clinical Impression   Pt received in bed, finishing with OT, willing to participate in therapy, but very fatigued. Performed bed mobility with S for safety, sit to stand transfers with min guard progressing to minA throughout session as he fatigued. Ambulated in hallway 100 ft with min guard for safety and balance. Pt generally able to navigate through the hallway obstacles. Ascended/descended 2 steps with min guard for safety. Performed neuro testing, pt unable to stay awake for sensation testing. Strength grossly 4/5 to -4/5 on R LE. Noted L side hemianopsia. Pt's functional mobility is safe and steady at this time, and his daughters will stay with him 24/7, so recommend outpatient PT to return pt to previous function.    Follow Up Recommendations Outpatient PT    Equipment Recommendations  (Pt has necessary equipment at home [facility]))    Recommendations for Other Services       Precautions / Restrictions Precautions Precautions: None Restrictions Weight Bearing Restrictions: No      Mobility  Bed Mobility Overal bed mobility: Needs Assistance Bed Mobility: Sit to Supine       Sit to supine: Supervision   General bed mobility comments: Pt able to perform bed mobility with HOB elevated, bed rails. no physical assist needed  Transfers Overall transfer level: Needs assistance Equipment used: None Transfers: Sit to/from Stand Sit to Stand: Min assist;Min guard         General transfer comment: Pt requiring min guard to minA for sit to stand transfers throughout session. Suspect the times he needed minA were related  to him being very tired. MinA for boost from sit.  Ambulation/Gait Ambulation/Gait assistance: Min guard Gait Distance (Feet): 100 Feet Assistive device: None Gait Pattern/deviations: Step-through pattern;Decreased dorsiflexion - right;Narrow base of support;Drifts right/left Gait velocity: decreased Gait velocity interpretation: 1.31 - 2.62 ft/sec, indicative of limited community ambulator General Gait Details: Pt able to ambulate in hall with min guard for safety and guidance. Noted drift to the right, suspect related to L hemianopsia. Mildly unsteady at times, but overall safe without AD.  Stairs Stairs: Yes Stairs assistance: Min guard Stair Management: Two rails;Alternating pattern;Forwards Number of Stairs: 3 General stair comments: Pt went up 2, down 3 steps, with bilateral rails, alternating pattern, min guard for safety.  Wheelchair Mobility    Modified Rankin (Stroke Patients Only)       Balance Overall balance assessment: Mild deficits observed, not formally tested                                           Pertinent Vitals/Pain Pain Assessment: No/denies pain    Home Living Family/patient expects to be discharged to:: Private residence Living Arrangements: Alone Available Help at Discharge: Family;Available 24 hours/day Type of Home: House Home Access: Stairs to enter Entrance Stairs-Rails: Can reach both Entrance Stairs-Number of Steps: 2 Home Layout: One level Home Equipment: Walker - 2 wheels;Bedside commode      Prior Function Level of Independence: Independent  Hand Dominance   Dominant Hand: Right    Extremity/Trunk Assessment   Upper Extremity Assessment Upper Extremity Assessment: Defer to OT evaluation    Lower Extremity Assessment Lower Extremity Assessment: RLE deficits/detail RLE Deficits / Details: states that his RLE is his "weak side" from previous stroke. Hip flex, plantar flex, dorsiflexion  4/5. Knee flex/ext 4+/5 RLE Sensation: (difficult to assess due to pt falling asleep) RLE Coordination: WNL       Communication   Communication: No difficulties  Cognition Arousal/Alertness: Lethargic Behavior During Therapy: WFL for tasks assessed/performed Overall Cognitive Status: Within Functional Limits for tasks assessed                                 General Comments: Pt very sleepy during session but able to safely participate in all functional mobility tasks      General Comments      Exercises     Assessment/Plan    PT Assessment Patient needs continued PT services  PT Problem List Decreased strength;Decreased balance;Decreased mobility;Decreased activity tolerance;Decreased coordination       PT Treatment Interventions Functional mobility training;DME instruction;Balance training;Patient/family education;Gait training;Therapeutic activities;Neuromuscular re-education;Stair training;Therapeutic exercise    PT Goals (Current goals can be found in the Care Plan section)  Acute Rehab PT Goals Patient Stated Goal: go home PT Goal Formulation: With patient/family Time For Goal Achievement: 02/28/18 Potential to Achieve Goals: Good    Frequency Min 3X/week   Barriers to discharge        Co-evaluation               AM-PAC PT "6 Clicks" Mobility  Outcome Measure Help needed turning from your back to your side while in a flat bed without using bedrails?: None Help needed moving from lying on your back to sitting on the side of a flat bed without using bedrails?: None Help needed moving to and from a bed to a chair (including a wheelchair)?: A Little Help needed standing up from a chair using your arms (e.g., wheelchair or bedside chair)?: A Little Help needed to walk in hospital room?: A Little Help needed climbing 3-5 steps with a railing? : A Little 6 Click Score: 20    End of Session Equipment Utilized During Treatment: Gait  belt Activity Tolerance: Patient tolerated treatment well Patient left: in bed;with call bell/phone within reach;with bed alarm set;with family/visitor present   PT Visit Diagnosis: Unsteadiness on feet (R26.81);Other symptoms and signs involving the nervous system (R29.898)    Time:  -      Charges:              Ronnell Guadalajara, SPT   Ronnell Guadalajara 02/14/2018, 2:31 PM

## 2018-02-14 NOTE — Progress Notes (Signed)
  Echocardiogram 2D Echocardiogram has been performed.  Brian Brown 02/14/2018, 10:51 AM

## 2018-02-14 NOTE — Plan of Care (Signed)
  Problem: Clinical Measurements: Goal: Ability to maintain clinical measurements within normal limits will improve Outcome: Progressing   Problem: Clinical Measurements: Goal: Respiratory complications will improve Outcome: Progressing   Problem: Clinical Measurements: Goal: Cardiovascular complication will be avoided Outcome: Not Progressing   

## 2018-02-15 DIAGNOSIS — I10 Essential (primary) hypertension: Secondary | ICD-10-CM

## 2018-02-15 MED ORDER — ASPIRIN EC 81 MG PO TBEC
81.0000 mg | DELAYED_RELEASE_TABLET | Freq: Every day | ORAL | Status: DC
  Administered 2018-02-15: 81 mg via ORAL
  Filled 2018-02-15: qty 1

## 2018-02-15 MED ORDER — ASPIRIN EC 81 MG PO TBEC: 81.0000 mg | DELAYED_RELEASE_TABLET | Freq: Every day | ORAL | 0 refills | Status: AC

## 2018-02-15 NOTE — Care Management Note (Signed)
Case Management Note  Patient Details  Name: Brian Brown MRN: [id number] Date of Birth: 1932-02-01  Subjective/Objective:   Pt admitted with a stroke. He is from home alone but has family close that can assist. Pts 3 daughters live in [place].  No issues obtaining home meds. Family to assist with transportation.                 Action/Plan: Pt discharging home with self care and outpatient therapy. CM provided choice and [facility] selected. Orders in Epic and information on the AVS. Daughters plan on staying with patient for few days and then see his needs. Pt also has other family local that can assist.  Daughters providing transport home.  Expected Discharge Date:  02/15/18               Expected Discharge Plan:  OP Rehab  In-House Referral:     Discharge planning Services  CM Consult  Post Acute Care Choice:    Choice offered to:     DME Arranged:    DME Agency:     HH Arranged:    HH Agency:     Status of Service:  Completed, signed off  If discussed at [employer] of Stay Meetings, dates discussed:    Additional Comments:  Pollie Friar, RN 02/15/2018, 10:39 AM

## 2018-02-15 NOTE — Progress Notes (Signed)
Patient discharged with family. IV removed with catheter intact.  All belongings sent with patient.

## 2018-02-15 NOTE — Progress Notes (Signed)
Physical Therapy Treatment Patient Details Name: Brian Brown MRN: [id number] DOB: 07-12-1932 Today's Date: 02/15/2018    History of Present Illness Pt is an 85 y.o. male with signifcant PMH of stroke with right hemiparesis, prostate cancer, sleep apnea, hypertension who presented to the emergency room with sudden onset of loss of vision on the left side. MRI showed Rt PCA infarct.    PT Comments    Pt performed gait training with SPC to improve balance.  When distracted and patient looks away from path of ambulation he presents with LOB to the R and required min assistance to correct.  Utilized cane on R side due to L visual deficit.  Educated daughters on sequencing and use of gait belt at home.  Pt has 24 hour assistance at home and will f/u with OPPT.    Follow Up Recommendations  Outpatient PT     Equipment Recommendations  (has cane at home, educated on height adjustment and to use at home.  )    Recommendations for Other Services       Precautions / Restrictions Precautions Precautions: None Restrictions Weight Bearing Restrictions: No    Mobility  Bed Mobility               General bed mobility comments: Pt seated in recliner chair on arrival.    Transfers Overall transfer level: Needs assistance Equipment used: Straight cane Transfers: Sit to/from Stand Sit to Stand: Supervision         General transfer comment: Placed cane on L side to see if patient could find it visually once in standing.    Ambulation/Gait Ambulation/Gait assistance: Min guard Gait Distance (Feet): 150 Feet Assistive device: None Gait Pattern/deviations: Step-through pattern;Decreased dorsiflexion - right;Narrow base of support;Drifts right/left Gait velocity: decreased   General Gait Details: Utilized cane on R side due to L visual deficits.  Pt does not present with significant weakness and able to manage cane in R hand.  He does present with LOB to the R once.  Required  min guard to correct.  Gt belt issued to patient and adjusted to patients size.  Pt required cues for sequencing throughout gt training.     Stairs Stairs: Yes Stairs assistance: +2 safety/equipment Stair Management: Two rails;Alternating pattern;Forwards Number of Stairs: 2 General stair comments: Cues for sequencing and safety.  Educated on how to bring cane with him when negotiating stairs.     Wheelchair Mobility    Modified Rankin (Stroke Patients Only) Modified Rankin (Stroke Patients Only) Pre-Morbid Rankin Score: No significant disability Modified Rankin: Moderately severe disability     Balance Overall balance assessment: Mild deficits observed, not formally tested;Needs assistance   Sitting balance-Leahy Scale: Good       Standing balance-Leahy Scale: Poor                              Cognition Arousal/Alertness: Awake/alert Behavior During Therapy: WFL for tasks assessed/performed Overall Cognitive Status: Impaired/Different from baseline Area of Impairment: Attention;Memory;Awareness;Problem solving                   Current Attention Level: Alternating Memory: Decreased short-term memory     Awareness: Emergent Problem Solving: Requires verbal cues        Exercises      General Comments        Pertinent Vitals/Pain Pain Assessment: No/denies pain    Home Living  Prior Function            PT Goals (current goals can now be found in the care plan section) Acute Rehab PT Goals Patient Stated Goal: go home Potential to Achieve Goals: Good Progress towards PT goals: Progressing toward goals    Frequency    Min 3X/week      PT Plan Current plan remains appropriate    Co-evaluation              AM-PAC PT "6 Clicks" Mobility   Outcome Measure  Help needed turning from your back to your side while in a flat bed without using bedrails?: None Help needed moving from lying on  your back to sitting on the side of a flat bed without using bedrails?: None Help needed moving to and from a bed to a chair (including a wheelchair)?: A Little Help needed standing up from a chair using your arms (e.g., wheelchair or bedside chair)?: A Little Help needed to walk in hospital room?: A Little Help needed climbing 3-5 steps with a railing? : A Little 6 Click Score: 20    End of Session Equipment Utilized During Treatment: Gait belt Activity Tolerance: Patient tolerated treatment well Patient left: in bed;with call bell/phone within reach;with bed alarm set;with family/visitor present Nurse Communication: Mobility status PT Visit Diagnosis: Unsteadiness on feet (R26.81);Other symptoms and signs involving the nervous system (R29.898)     Time: 7614-7092 PT Time Calculation (min) (ACUTE ONLY): 17 min  Charges:  $Gait Training: 8-22 mins                     Governor Rooks, PTA Acute Rehabilitation Services Pager [phone] Office [phone]     Bernadette Armijo Eli Hose 02/15/2018, 1:54 PM

## 2018-02-15 NOTE — Discharge Summary (Signed)
Physician Discharge Summary  Brian Brown OXB:[id number] DOB: July 21, 1932 DOA: 02/13/2018  PCP: Seward Carol, MD  Admit date: 02/13/2018 Discharge date: 02/15/2018  Admitted From: Inpatient Disposition: home  Recommendations for Outpatient Follow-up:  1. Follow up with PCP in 1-2 weeks 2. Please obtain BMP/CBC in one week 3. Please follow up on the following pending results:  Home Health:No Equipment/Devices:NONE  Discharge Condition:Stable CODE STATUS:Full code Diet recommendation: Regular healthy diet  Brief/Interim Summary: Per admitting  MD: Nelia Shi a 85 y.o.malewith medical history significant ofhistory of a stroke with right hemiparesis, prostate cancer, sleep apnea currently not wearing CPAP, hypertension who presents to the emergency room with sudden onset of loss of vision on the left side. According to the patient, he had a stroke in 2010 and since had some weakness on the right leg, however he has been functioning normally. He occasionally uses cane otherwise independent. He does use Plavix.Patient stated that he went to bed last night with his normal routine, he woke up today morning and he cannot see anything from his left eye on the outer aspect. He denies any other symptoms including headache, nausea, vomiting, weakness, speech difficulties. He has no new weakness on his extremities. Denies any fever cough shortness of breath flulike symptoms. Denies any pain in his eyes. ED Course:Hemodynamically stable in the ER. Creatinine 1.28. CT scan of the head is essentially normal. He was found to have left hemianopsia. No other focal neurological deficits. Not a TPA candidate because of minimal symptoms. Code stroke was called and patient was in no need of acute intervention including TPA and clot retrieval. Given aspirin in the ER.  Hospital course: Right lobe occipital CVA: Patient was admitted under stroke protocol.  He had unilateral peripheral  vision loss with moderate improvement in the hospital.  He was placed on neurochecks and blood pressure control per stroke protocol.  Patient as noted was not a TPA or vascular intervention candidate because of minimal symptoms.  He was seen by PT OT and speech therapy with recommendation for outpatient follow-up.  He will be on aspirin 81 mg p.o. daily x21 days as well as Plavix indefinitely.  He will continue Crestor on discharge prescription provided.  As noted patient MRI of neck which revealed right occipital CVA MRI was negative indicating probably recanalization and clearing.  Echocardiogram did not identify any acute findings and hemoglobin A1c was 5.8 and lipid profile was ordered a total cholesterol 117 HLD 45 LDL 62 and triglycerides of 48.  Hypertension continue home medications on discharge.  Discussed with the patient importance of blood pressure control for stroke risk reduction.  Sleep apnea.  Untreated patient does not like CPAP.  Reports he has not been taking CPAP.   Discharge Diagnoses:  Principal Problem:   TIA (transient ischemic attack) Active Problems:   Hypertension   Sleep apnea   Stroke/cerebrovascular accident [facility])   Acute CVA (cerebrovascular accident) [facility])    Discharge Instructions  Discharge Instructions    Ambulatory referral to Neurology   Complete by:  As directed    Follow up with stroke clinic NP (Jessica Vanschaick or Cecille Rubin, if both not available, consider Zachery Dauer, or Ahern) at [facility] in about 4 weeks. Thanks.   Call MD for:   Complete by:  As directed    Any acute change in medical condition   Diet - low sodium heart healthy   Complete by:  As directed    Increase activity slowly   Complete  by:  As directed      Allergies as of 02/15/2018   No Known Allergies     Medication List    TAKE these medications   aspirin EC 81 MG tablet Take 1 tablet (81 mg total) by mouth daily for 21 days. AFTER 21 DAYS STOP ASPIRIN AND CONTINUE  PLAVIX 75 MG   clopidogrel 75 MG tablet Commonly known as:  PLAVIX Take 75 mg by mouth daily with breakfast.   diltiazem 240 MG 24 hr capsule Commonly known as:  DILACOR XR Take 240 mg by mouth daily.   metoprolol tartrate 25 MG tablet Commonly known as:  LOPRESSOR Take 25 mg by mouth 2 (two) times daily.   pantoprazole 40 MG tablet Commonly known as:  PROTONIX Take 40 mg by mouth every morning.   rosuvastatin 10 MG tablet Commonly known as:  CRESTOR Take 10 mg by mouth every evening.      Follow-up Information    [facility]. Schedule an appointment as soon as possible for a visit in 4 week(s).   Specialty:  Neurology Contact information: [address] [place] [phone]         No Known Allergies  Consultations:  NEURO   Procedures/Studies: Ct Head Wo Contrast  Result Date: 02/13/2018 CLINICAL DATA:  Vision loss in left eye. EXAM: CT HEAD WITHOUT CONTRAST TECHNIQUE: Contiguous axial images were obtained from the base of the skull through the vertex without intravenous contrast. COMPARISON:  None. FINDINGS: Brain: Mild age related cerebral atrophy but no significant ventriculomegaly. There is moderate periventricular white matter disease. No findings for acute hemispheric infarction or intracranial hemorrhage. The brainstem and cerebellum are grossly normal. No mass lesions are identified. No abnormalities involving the sella or parasellar regions. The optic nerves appear grossly normal. Vascular: Minimal vascular calcifications but no aneurysm or hyperdense vessels. Skull: No skull fracture or bone lesions. Sinuses/Orbits: Small amount of fluid and mucoperiosteal thickening involving the left maxillary sinus. Mild mucoperiosteal thickening involving the right maxillary sinus. Scattered ethmoid sinus disease. Both halves of the sphenoid sinus are clear. The mastoid air cells and middle ear cavities are clear.  Other: No scalp lesions or hematoma. IMPRESSION: 1. Age related cerebral atrophy and periventricular white matter disease. 2. No acute intracranial findings or mass lesions. 3. Scattered sinus disease. Electronically Signed   By: Marijo Sanes M.D.   On: 02/13/2018 09:21   Mr Jodene Nam Neck W Wo Contrast  Result Date: 02/13/2018 CLINICAL DATA:  85 year old male presents with abrupt loss of left eye peripheral vision. No associated pain or headache. EXAM: MRI HEAD WITHOUT CONTRAST MRA HEAD WITHOUT CONTRAST MRA NECK WITHOUT AND WITH CONTRAST TECHNIQUE: Multiplanar, multiecho pulse sequences of the brain and surrounding structures were obtained without and with intravenous contrast. Angiographic images of the Circle of Willis were obtained using MRA technique without intravenous contrast. Angiographic images of the neck were obtained using MRA technique without and with intravenous contrast. Carotid stenosis measurements (when applicable) are obtained utilizing NASCET criteria, using the distal internal carotid diameter as the denominator. CONTRAST:  9 milliliters Gadavist. COMPARISON:  Head CT without contrast earlier today. FINDINGS: MRI HEAD FINDINGS Brain: Confluent cortical and some subcortical white matter restricted diffusion in the medial right occipital lobe in an area of 3-4 centimeters (series 5, image 78 and coronal image 40). Associated cytotoxic edema with T2 and FLAIR hyperintensity. No acute hemorrhage or mass effect. No other restricted diffusion. But there is widespread T2 heterogeneity  throughout the bilateral deep gray matter nuclei. Much of this appears to represent dilated perivascular spaces, but there are chronic lacunar infarcts of the left corona radiata and posterior lentiform. Similar widespread and patchy bilateral cerebral white matter T2 and FLAIR hyperintensity. Susceptibility weighted images demonstrate a chronic microhemorrhage in each occipital lobe and 1 in the right deep cerebellar  nuclei. No cortical encephalomalacia. Normal signal in the brainstem and cerebellum. No midline shift, mass effect, evidence of mass lesion, ventriculomegaly, extra-axial collection or acute intracranial hemorrhage. Cervicomedullary junction and pituitary are within normal limits. Vascular: Major intracranial vascular flow voids are preserved. There is generalized intracranial artery dolichoectasia. And distal left vertebral artery tortuosity results in mild mass effect on the ventral medulla (series 13, image 11). Skull and upper cervical spine: Bulky cervical spine endplate spurring suggesting Diffuse idiopathic skeletal hyperostosis (DISH). Visualized bone marrow signal is within normal limits. Sinuses/Orbits: Optic chiasm and orbits soft tissues are normal; prior cataract surgery. Mild to moderate left maxillary sinus mucosal thickening with a small fluid level. Trace paranasal sinus mucosal thickening elsewhere. Other: Mastoid air cells are clear. Visible internal auditory structures appear normal. Small volume retained secretions in the nasopharynx. Scalp and face soft tissues appear negative. MRA NECK FINDINGS Precontrast time-of-flight images demonstrate antegrade flow in both cervical carotid and vertebral arteries. Codominant vertebral arteries. Normal time-of-flight appearance at both carotid bifurcations. Post-contrast neck MRA images reveal a bovine type arch configuration. Proximal great vessels are tortuous, but otherwise within normal limits. Tortuous proximal right CCA with a mildly kinked appearance. Widely patent right carotid bifurcation and cervical right ICA. Tortuous proximal left CCA. Widely patent left carotid bifurcation and cervical left ICA. No proximal subclavian artery stenosis. Vertebral artery origins and cervical vertebral arteries are within normal limits. MRA HEAD FINDINGS Codominant and ectatic distal vertebral arteries without stenosis. Mass effect on the ventral medulla from the  tortuous distal left vertebral. Right PICA and dominant appearing left AICA origins are patent. Tortuous basilar artery with mild irregularity but no stenosis. Patent SCA and PCA origins. Posterior communicating arteries are diminutive or absent. Tortuous left P1. There is mild left P2 segment stenosis. The right PCA branches are within normal limits. Antegrade flow in dolichoectatic ICA siphons. No siphon stenosis. Normal ophthalmic artery origins. Patent carotid termini. Normal MCA and ACA origins. Anterior communicating artery is normal. Visible ACA branches are normal aside from tortuosity. MCA M1 segments are mildly tortuous. Bilateral MCA bifurcations and visible MCA branches are patent without stenosis. IMPRESSION: 1. 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 No associated hemorrhage or mass effect. Normal MRA appearance of the right PCA. 2. Generalized arterial dolichoectasia in the head and neck. No large vessel stenosis. There is mild stenosis of the left PCA P2. 3. Evidence of chronic small vessel disease in the brain, moderate for age, and with occasional chronic micro hemorrhages. Electronically Signed   By: Genevie Ann M.D.   On: 02/13/2018 18:39   Mr Brain Wo Contrast  Result Date: 02/13/2018 CLINICAL DATA:  85 year old male presents with abrupt loss of left eye peripheral vision. No associated pain or headache. EXAM: MRI HEAD WITHOUT CONTRAST MRA HEAD WITHOUT CONTRAST MRA NECK WITHOUT AND WITH CONTRAST TECHNIQUE: Multiplanar, multiecho pulse sequences of the brain and surrounding structures were obtained without and with intravenous contrast. Angiographic images of the Circle of Willis were obtained using MRA technique without intravenous contrast. Angiographic images of the neck were obtained using MRA technique without and with intravenous  contrast. Carotid stenosis measurements (when applicable) are obtained utilizing NASCET criteria, using the distal internal carotid  diameter as the denominator. CONTRAST:  9 milliliters Gadavist. COMPARISON:  Head CT without contrast earlier today. FINDINGS: MRI HEAD FINDINGS Brain: Confluent cortical and some subcortical white matter restricted diffusion in the medial right occipital lobe in an area of 3-4 centimeters (series 5, image 78 and coronal image 40). Associated cytotoxic edema with T2 and FLAIR hyperintensity. No acute hemorrhage or mass effect. No other restricted diffusion. But there is widespread T2 heterogeneity throughout the bilateral deep gray matter nuclei. Much of this appears to represent dilated perivascular spaces, but there are chronic lacunar infarcts of the left corona radiata and posterior lentiform. Similar widespread and patchy bilateral cerebral white matter T2 and FLAIR hyperintensity. Susceptibility weighted images demonstrate a chronic microhemorrhage in each occipital lobe and 1 in the right deep cerebellar nuclei. No cortical encephalomalacia. Normal signal in the brainstem and cerebellum. No midline shift, mass effect, evidence of mass lesion, ventriculomegaly, extra-axial collection or acute intracranial hemorrhage. Cervicomedullary junction and pituitary are within normal limits. Vascular: Major intracranial vascular flow voids are preserved. There is generalized intracranial artery dolichoectasia. And distal left vertebral artery tortuosity results in mild mass effect on the ventral medulla (series 13, image 11). Skull and upper cervical spine: Bulky cervical spine endplate spurring suggesting Diffuse idiopathic skeletal hyperostosis (DISH). Visualized bone marrow signal is within normal limits. Sinuses/Orbits: Optic chiasm and orbits soft tissues are normal; prior cataract surgery. Mild to moderate left maxillary sinus mucosal thickening with a small fluid level. Trace paranasal sinus mucosal thickening elsewhere. Other: Mastoid air cells are clear. Visible internal auditory structures appear normal. Small  volume retained secretions in the nasopharynx. Scalp and face soft tissues appear negative. MRA NECK FINDINGS Precontrast time-of-flight images demonstrate antegrade flow in both cervical carotid and vertebral arteries. Codominant vertebral arteries. Normal time-of-flight appearance at both carotid bifurcations. Post-contrast neck MRA images reveal a bovine type arch configuration. Proximal great vessels are tortuous, but otherwise within normal limits. Tortuous proximal right CCA with a mildly kinked appearance. Widely patent right carotid bifurcation and cervical right ICA. Tortuous proximal left CCA. Widely patent left carotid bifurcation and cervical left ICA. No proximal subclavian artery stenosis. Vertebral artery origins and cervical vertebral arteries are within normal limits. MRA HEAD FINDINGS Codominant and ectatic distal vertebral arteries without stenosis. Mass effect on the ventral medulla from the tortuous distal left vertebral. Right PICA and dominant appearing left AICA origins are patent. Tortuous basilar artery with mild irregularity but no stenosis. Patent SCA and PCA origins. Posterior communicating arteries are diminutive or absent. Tortuous left P1. There is mild left P2 segment stenosis. The right PCA branches are within normal limits. Antegrade flow in dolichoectatic ICA siphons. No siphon stenosis. Normal ophthalmic artery origins. Patent carotid termini. Normal MCA and ACA origins. Anterior communicating artery is normal. Visible ACA branches are normal aside from tortuosity. MCA M1 segments are mildly tortuous. Bilateral MCA bifurcations and visible MCA branches are patent without stenosis. IMPRESSION: 1. 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 No associated hemorrhage or mass effect. Normal MRA appearance of the right PCA. 2. Generalized arterial dolichoectasia in the head and neck. No large vessel stenosis. There is mild stenosis of the left PCA P2. 3.  Evidence of chronic small vessel disease in the brain, moderate for age, and with occasional chronic micro hemorrhages. Electronically Signed   By: Genevie Ann M.D.   On: 02/13/2018  18:39   Mr Virgel Paling LO Contrast  Result Date: 02/13/2018 CLINICAL DATA:  85 year old male presents with abrupt loss of left eye peripheral vision. No associated pain or headache. EXAM: MRI HEAD WITHOUT CONTRAST MRA HEAD WITHOUT CONTRAST MRA NECK WITHOUT AND WITH CONTRAST TECHNIQUE: Multiplanar, multiecho pulse sequences of the brain and surrounding structures were obtained without and with intravenous contrast. Angiographic images of the Circle of Willis were obtained using MRA technique without intravenous contrast. Angiographic images of the neck were obtained using MRA technique without and with intravenous contrast. Carotid stenosis measurements (when applicable) are obtained utilizing NASCET criteria, using the distal internal carotid diameter as the denominator. CONTRAST:  9 milliliters Gadavist. COMPARISON:  Head CT without contrast earlier today. FINDINGS: MRI HEAD FINDINGS Brain: Confluent cortical and some subcortical white matter restricted diffusion in the medial right occipital lobe in an area of 3-4 centimeters (series 5, image 78 and coronal image 40). Associated cytotoxic edema with T2 and FLAIR hyperintensity. No acute hemorrhage or mass effect. No other restricted diffusion. But there is widespread T2 heterogeneity throughout the bilateral deep gray matter nuclei. Much of this appears to represent dilated perivascular spaces, but there are chronic lacunar infarcts of the left corona radiata and posterior lentiform. Similar widespread and patchy bilateral cerebral white matter T2 and FLAIR hyperintensity. Susceptibility weighted images demonstrate a chronic microhemorrhage in each occipital lobe and 1 in the right deep cerebellar nuclei. No cortical encephalomalacia. Normal signal in the brainstem and cerebellum. No  midline shift, mass effect, evidence of mass lesion, ventriculomegaly, extra-axial collection or acute intracranial hemorrhage. Cervicomedullary junction and pituitary are within normal limits. Vascular: Major intracranial vascular flow voids are preserved. There is generalized intracranial artery dolichoectasia. And distal left vertebral artery tortuosity results in mild mass effect on the ventral medulla (series 13, image 11). Skull and upper cervical spine: Bulky cervical spine endplate spurring suggesting Diffuse idiopathic skeletal hyperostosis (DISH). Visualized bone marrow signal is within normal limits. Sinuses/Orbits: Optic chiasm and orbits soft tissues are normal; prior cataract surgery. Mild to moderate left maxillary sinus mucosal thickening with a small fluid level. Trace paranasal sinus mucosal thickening elsewhere. Other: Mastoid air cells are clear. Visible internal auditory structures appear normal. Small volume retained secretions in the nasopharynx. Scalp and face soft tissues appear negative. MRA NECK FINDINGS Precontrast time-of-flight images demonstrate antegrade flow in both cervical carotid and vertebral arteries. Codominant vertebral arteries. Normal time-of-flight appearance at both carotid bifurcations. Post-contrast neck MRA images reveal a bovine type arch configuration. Proximal great vessels are tortuous, but otherwise within normal limits. Tortuous proximal right CCA with a mildly kinked appearance. Widely patent right carotid bifurcation and cervical right ICA. Tortuous proximal left CCA. Widely patent left carotid bifurcation and cervical left ICA. No proximal subclavian artery stenosis. Vertebral artery origins and cervical vertebral arteries are within normal limits. MRA HEAD FINDINGS Codominant and ectatic distal vertebral arteries without stenosis. Mass effect on the ventral medulla from the tortuous distal left vertebral. Right PICA and dominant appearing left AICA origins are  patent. Tortuous basilar artery with mild irregularity but no stenosis. Patent SCA and PCA origins. Posterior communicating arteries are diminutive or absent. Tortuous left P1. There is mild left P2 segment stenosis. The right PCA branches are within normal limits. Antegrade flow in dolichoectatic ICA siphons. No siphon stenosis. Normal ophthalmic artery origins. Patent carotid termini. Normal MCA and ACA origins. Anterior communicating artery is normal. Visible ACA branches are normal aside from tortuosity. MCA M1 segments are mildly tortuous.  Bilateral MCA bifurcations and visible MCA branches are patent without stenosis. IMPRESSION: 1. 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 No associated hemorrhage or mass effect. Normal MRA appearance of the right PCA. 2. Generalized arterial dolichoectasia in the head and neck. No large vessel stenosis. There is mild stenosis of the left PCA P2. 3. Evidence of chronic small vessel disease in the brain, moderate for age, and with occasional chronic micro hemorrhages. Electronically Signed   By: Genevie Ann M.D.   On: 02/13/2018 18:39   Vas [place] Lower Extremity Venous (dvt)  Result Date: 02/14/2018  Lower Venous Study Indications: Stroke.  Comparison Study: No prior study on file for comparison Performing Technologist: Sharion Dove RVS  Examination Guidelines: A complete evaluation includes B-mode imaging, spectral Doppler, color Doppler, and power Doppler as needed of all accessible portions of each vessel. Bilateral testing is considered an integral part of a complete examination. Limited examinations for reoccurring indications may be performed as noted.  Right Venous Findings: +---------+---------------+---------+-----------+----------+-------+          CompressibilityPhasicitySpontaneityPropertiesSummary +---------+---------------+---------+-----------+----------+-------+ CFV      Full           Yes      Yes                           +---------+---------------+---------+-----------+----------+-------+ SFJ      Full                                                 +---------+---------------+---------+-----------+----------+-------+ FV Prox  Full                                                 +---------+---------------+---------+-----------+----------+-------+ FV Mid   Full                                                 +---------+---------------+---------+-----------+----------+-------+ FV DistalFull                                                 +---------+---------------+---------+-----------+----------+-------+ PFV      Full                                                 +---------+---------------+---------+-----------+----------+-------+ POP      Full           Yes      Yes                          +---------+---------------+---------+-----------+----------+-------+ PTV      Full                                                 +---------+---------------+---------+-----------+----------+-------+  PERO     Full                                                 +---------+---------------+---------+-----------+----------+-------+  Left Venous Findings: +---------+---------------+---------+-----------+----------+-------+          CompressibilityPhasicitySpontaneityPropertiesSummary +---------+---------------+---------+-----------+----------+-------+ CFV      Full           Yes      Yes                          +---------+---------------+---------+-----------+----------+-------+ SFJ      Full                                                 +---------+---------------+---------+-----------+----------+-------+ FV Prox  Full                                                 +---------+---------------+---------+-----------+----------+-------+ FV Mid   Full                                                  +---------+---------------+---------+-----------+----------+-------+ FV DistalFull                                                 +---------+---------------+---------+-----------+----------+-------+ PFV      Full                                                 +---------+---------------+---------+-----------+----------+-------+ POP      Full           Yes      Yes                          +---------+---------------+---------+-----------+----------+-------+ PTV      Full                                                 +---------+---------------+---------+-----------+----------+-------+ PERO     Full                                                 +---------+---------------+---------+-----------+----------+-------+    Summary: Right: There is no evidence of deep vein thrombosis in the lower extremity. Left: There is no evidence of deep vein thrombosis in the lower extremity.  *See table(s) above for measurements and observations. Electronically signed by Ruta Hinds MD on 02/14/2018 at  5:22:31 PM.    Final        Subjective: Patient reports visions improving. Daughters were bedside answered all questions.   Discharge Exam: Vitals:   02/15/18 0420 02/15/18 0755  BP: (!) 143/89 (!) 140/102  Pulse: (!) 57 61  Resp: 18 18  Temp: 98.2 F (36.8 C) 98 F (36.7 C)  SpO2: 97% 100%   Vitals:   02/14/18 2006 02/14/18 2353 02/15/18 0420 02/15/18 0755  BP: 126/71 131/81 (!) 143/89 (!) 140/102  Pulse: (!) 55 60 (!) 57 61  Resp: 15 18 18 18   Temp: 98.3 F (36.8 C) 98.2 F (36.8 C) 98.2 F (36.8 C) 98 F (36.7 C)  TempSrc: Oral Oral Oral Oral  SpO2: 97% 98% 97% 100%  Weight:      Height:        General: Pt is alert, awake, not in acute distress Cardiovascular: RRR, S1/S2 +, no rubs, no gallops Respiratory: CTA bilaterally, no wheezing, no rhonchi Abdominal: Soft, NT, ND, bowel sounds + Extremities: no edema, no cyanosis    The results of significant  diagnostics from this hospitalization (including imaging, microbiology, ancillary and laboratory) are listed below for reference.     Microbiology: No results found for this or any previous visit (from the past 240 hour(s)).   Labs: BNP (last 3 results) No results for input(s): BNP in the last 8760 hours. Basic Metabolic Panel: Recent Labs  Lab 02/13/18 0815  NA 140  K 3.4*  CL 105  CO2 27  GLUCOSE 95  BUN 12  CREATININE 1.28*  CALCIUM 9.0   Liver Function Tests: Recent Labs  Lab 02/13/18 0815  AST 13*  ALT 15  ALKPHOS 64  BILITOT 0.8  PROT 6.6  ALBUMIN 3.2*   No results for input(s): LIPASE, AMYLASE in the last 168 hours. No results for input(s): AMMONIA in the last 168 hours. CBC: Recent Labs  Lab 02/13/18 0815  WBC 6.2  NEUTROABS 4.1  HGB 12.7*  HCT 40.2  MCV 89.3  PLT 201   Cardiac Enzymes: No results for input(s): CKTOTAL, CKMB, CKMBINDEX, TROPONINI in the last 168 hours. BNP: Invalid input(s): POCBNP CBG: Recent Labs  Lab 02/13/18 0752  GLUCAP 92   D-Dimer No results for input(s): DDIMER in the last 72 hours. Hgb A1c Recent Labs    02/14/18 0303  HGBA1C 5.8*   Lipid Profile Recent Labs    02/14/18 0303  CHOL 117  HDL 45  LDLCALC 62  TRIG 48  CHOLHDL 2.6   Thyroid function studies No results for input(s): TSH, T4TOTAL, T3FREE, THYROIDAB in the last 72 hours.  Invalid input(s): FREET3 Anemia work up No results for input(s): VITAMINB12, FOLATE, FERRITIN, TIBC, IRON, RETICCTPCT in the last 72 hours. Urinalysis    Component Value Date/Time   COLORURINE YELLOW 02/13/2018 0914   APPEARANCEUR CLEAR 02/13/2018 0914   LABSPEC 1.012 02/13/2018 0914   PHURINE 7.0 02/13/2018 0914   GLUCOSEU NEGATIVE 02/13/2018 0914   HGBUR SMALL (A) 02/13/2018 0914   BILIRUBINUR NEGATIVE 02/13/2018 0914   KETONESUR NEGATIVE 02/13/2018 0914   PROTEINUR NEGATIVE 02/13/2018 0914   UROBILINOGEN 0.2 01/27/2011 1233   NITRITE NEGATIVE 02/13/2018 0914    LEUKOCYTESUR NEGATIVE 02/13/2018 0914   Sepsis Labs Invalid input(s): PROCALCITONIN,  WBC,  LACTICIDVEN Microbiology No results found for this or any previous visit (from the past 240 hour(s)).   Time coordinating discharge: Over 30 minutes  SIGNED:   Nicolette Bang, MD  [facility] 02/15/2018, 10:32 AM Pager  If 7PM-7AM, please contact night-coverage [email or web address] Password [email or web address]

## 2018-02-15 NOTE — Progress Notes (Signed)
Occupational Therapy Treatment Patient Details Name: Brian Brown MRN: [id number] DOB: [birth year] Today's Date: 02/15/2018    History of present illness Pt is an 85 y.o. male with signifcant PMH of stroke with right hemiparesis, prostate cancer, sleep apnea, hypertension who presented to the emergency room with sudden onset of loss of vision on the left side. MRI showed Rt PCA infarct.   OT comments  Instructed pt and daughters on visual scanning strategies, use of anchors for pen and paper tasks, safety at home and community.   Written info provided.   Pt able to perform ADLs with supervision - required supervision and min cues to locate bathroom.  Recommend OPOT   Follow Up Recommendations  Outpatient OT;Supervision/Assistance - 24 hour    Equipment Recommendations  None recommended by OT    Recommendations for Other Services      Precautions / Restrictions Precautions Precautions: None Restrictions Weight Bearing Restrictions: No       Mobility Bed Mobility Overal bed mobility: Modified Independent             General bed mobility comments: Pt seated in recliner chair on arrival.    Transfers Overall transfer level: Needs assistance Equipment used: None Transfers: Sit to/from Stand;Stand Pivot Transfers Sit to Stand: Supervision Stand pivot transfers: Supervision       General transfer comment: Placed cane on L side to see if patient could find it visually once in standing.      Balance Overall balance assessment: Needs assistance Sitting-balance support: Feet supported Sitting balance-Leahy Scale: Good     Standing balance support: No upper extremity supported;During functional activity Standing balance-Leahy Scale: Fair                             ADL either performed or assessed with clinical judgement   ADL Overall ADL's : Needs assistance/impaired Eating/Feeding: Independent   Grooming: Brushing hair;Supervision/safety;Standing            Upper Body Dressing : Supervision/safety;Standing       Toilet Transfer: Supervision/safety;Ambulation;Comfort height toilet Toilet Transfer Details (indicate cue type and reason): cues to locate bathroom          Functional mobility during ADLs: Supervision/safety;Min guard       Vision   Additional Comments: mod cues to scan Lt and Rt while ambulating   Perception     Praxis      Cognition Arousal/Alertness: Awake/alert Behavior During Therapy: WFL for tasks assessed/performed Overall Cognitive Status: Impaired/Different from baseline Area of Impairment: Attention;Memory;Awareness;Problem solving                   Current Attention Level: Alternating Memory: Decreased short-term memory     Awareness: Emergent Problem Solving: Requires verbal cues General Comments: Pt unable to locate bathroom in room today - required min cues         Exercises     Shoulder Instructions       General Comments Pt instructed on scanning strategies, he was provided with anchors to mark Lt margin of pages during paper and pen tasks.   Discussed safety at home, managing his dog, and safety in the community with pt and daughters - written handout provided     Pertinent Vitals/ Pain       Pain Assessment: No/denies pain  Home Living  Prior Functioning/Environment              Frequency  Min 2X/week        Progress Toward Goals  OT Goals(current goals can now be found in the care plan section)  Progress towards OT goals: Progressing toward goals  Acute Rehab OT Goals Patient Stated Goal: go home  Plan Discharge plan remains appropriate    Co-evaluation                 AM-PAC OT "6 Clicks" Daily Activity     Outcome Measure   Help from another person eating meals?: None Help from another person taking care of personal grooming?: A Little Help from another person toileting,  which includes using toliet, bedpan, or urinal?: A Little Help from another person bathing (including washing, rinsing, drying)?: A Little Help from another person to put on and taking off regular upper body clothing?: A Little Help from another person to put on and taking off regular lower body clothing?: A Little 6 Click Score: 19    End of Session    OT Visit Diagnosis: Cognitive communication deficit (R41.841);Low vision, both eyes (H54.2) Symptoms and signs involving cognitive functions: Cerebral infarction   Activity Tolerance Patient tolerated treatment well   Patient Left in bed;with call bell/phone within reach;with nursing/sitter in room;with family/visitor present   Nurse Communication Mobility status        Time: 1030-1120 OT Time Calculation (min): 50 min  Charges: OT General Charges $OT Visit: 1 Visit OT Treatments $Self Care/Home Management : 38-52 mins  Lucille Passy, OTR/L [place] Pager [phone] Office [phone]    Lucille Passy M 02/15/2018, 2:23 PM

## 2018-02-16 NOTE — Consult Note (Signed)
            [facility] CM Primary Care Navigator  02/16/2018  Brian Brown 12-Mar-1932 [id number]   Wenttoseepatient at the bedsideto identify possible discharge needs buthe was alreadydischargedhome with Outpatient therapy.   Per MD note, patient presented to the emergency room with sudden onset of loss of vision to the left side. (right lobe occipital CVA, HTN, sleep apnea- untreated)  Primary care provider's officeis listed asprovidingtransition of care (TOC) follow-up.  Patient hasdischarge instruction to follow-up withprimary care provider in 1- 2 weeks and neurology follow-up in 4 weeks.  Noted order for EMMI stroke calls already in place after discharge.   For additional questions please contact:  Edwena Felty A. Jenie Parish, BSN, RN-BC [facility] PRIMARY CARE Navigator Cell: [phone]

## 2018-02-25 DIAGNOSIS — H02831 Dermatochalasis of right upper eyelid: Secondary | ICD-10-CM | POA: Diagnosis not present

## 2018-02-25 DIAGNOSIS — H43811 Vitreous degeneration, right eye: Secondary | ICD-10-CM | POA: Diagnosis not present

## 2018-02-25 DIAGNOSIS — I63532 Cerebral infarction due to unspecified occlusion or stenosis of left posterior cerebral artery: Secondary | ICD-10-CM | POA: Diagnosis not present

## 2018-02-25 DIAGNOSIS — H04123 Dry eye syndrome of bilateral lacrimal glands: Secondary | ICD-10-CM | POA: Diagnosis not present

## 2018-02-25 DIAGNOSIS — H02834 Dermatochalasis of left upper eyelid: Secondary | ICD-10-CM | POA: Diagnosis not present

## 2018-02-25 DIAGNOSIS — H5310 Unspecified subjective visual disturbances: Secondary | ICD-10-CM | POA: Diagnosis not present

## 2018-02-25 DIAGNOSIS — Z961 Presence of intraocular lens: Secondary | ICD-10-CM | POA: Diagnosis not present

## 2018-02-28 DIAGNOSIS — H53462 Homonymous bilateral field defects, left side: Secondary | ICD-10-CM | POA: Diagnosis not present

## 2018-03-10 ENCOUNTER — Ambulatory Visit: Payer: Medicare Other | Attending: Internal Medicine | Admitting: Physical Therapy

## 2018-03-10 ENCOUNTER — Other Ambulatory Visit: Payer: Self-pay

## 2018-03-10 ENCOUNTER — Encounter: Payer: Self-pay | Admitting: Occupational Therapy

## 2018-03-10 ENCOUNTER — Encounter: Payer: Self-pay | Admitting: Physical Therapy

## 2018-03-10 ENCOUNTER — Ambulatory Visit: Payer: Medicare Other | Admitting: Occupational Therapy

## 2018-03-10 DIAGNOSIS — R293 Abnormal posture: Secondary | ICD-10-CM | POA: Insufficient documentation

## 2018-03-10 DIAGNOSIS — R41842 Visuospatial deficit: Secondary | ICD-10-CM | POA: Insufficient documentation

## 2018-03-10 DIAGNOSIS — R278 Other lack of coordination: Secondary | ICD-10-CM | POA: Insufficient documentation

## 2018-03-10 DIAGNOSIS — R2689 Other abnormalities of gait and mobility: Secondary | ICD-10-CM | POA: Insufficient documentation

## 2018-03-10 DIAGNOSIS — R2681 Unsteadiness on feet: Secondary | ICD-10-CM

## 2018-03-10 DIAGNOSIS — M6281 Muscle weakness (generalized): Secondary | ICD-10-CM

## 2018-03-10 NOTE — Therapy (Signed)
[facility] [address] [place], [place], [address] Phone: [phone]   Fax:  [phone]  Physical Therapy Evaluation  Patient Details  Name: Brian Brown MRN: [id number] Date of Birth: 1932-05-04 Referring Provider (PT): Seward Carol, MD is PCP Nicolette Bang, MD is hospitalist who referred)   Encounter Date: 03/10/2018  PT End of Session - 03/10/18 1746    Visit Number  1    Number of Visits  17    Authorization Type  [place] picks up 20% not covered by Medicare    PT Start Time  1010    PT Stop Time  1055    PT Time Calculation (min)  45 min    Equipment Utilized During Treatment  Gait belt    Activity Tolerance  Patient tolerated treatment well    Behavior During Therapy  [facility] for tasks assessed/performed       Past Medical History:  Diagnosis Date  . Arthritis    degenerative joint disease  . Bleeding stomach ulcer 25 yrs ago  . Cancer [facility]) 2002   prostate, s/p surgery  . Hepatitis 1981   does not know what kind;denies jaundice  . Hypertension    treated by Dr Delfina Redwood, primary MD  . Sleep apnea 2005   does not wear a Cpap  . Stroke ([place]) 2003   w/ Rt sided weakness-some-rt leg  . Wears dentures    full top  . Wears glasses     Past Surgical History:  Procedure Laterality Date  . CARPAL TUNNEL RELEASE Left 03/29/2013   Procedure: LEFT CARPAL TUNNEL RELEASE;  Surgeon: Wynonia Sours, MD;  Location: [facility];  Service: Orthopedics;  Laterality: Left;  ANESTHESIA: IV REGIONAL FAB  . CARPAL TUNNEL RELEASE Right 03/06/2014   Procedure: RIGHT CARPAL TUNNEL RELEASE;  Surgeon: Daryll Brod, MD;  Location: [facility];  Service: Orthopedics;  Laterality: Right;  . COLONOSCOPY    . HERNIA REPAIR  2009   rt inguinal hernia  . JOINT REPLACEMENT  12/2001   rt hip ;[place]  2002  . SHOULDER ARTHROSCOPY  W/ ROTATOR CUFF REPAIR  2014   lt-dr whitfield  . TOTAL HIP ARTHROPLASTY  02/03/2011   Procedure: TOTAL HIP ARTHROPLASTY;  Surgeon: Garald Balding, MD;  Location: [facility];  Service: Orthopedics;  Laterality: Left;  . TRIGGER FINGER RELEASE Left 03/29/2013   Procedure: LEFT MIDDLE/SMALL FINGER RELEASE A-1 PULLEY AND LEFT RING FINGER;  Surgeon: Wynonia Sours, MD;  Location: [facility];  Service: Orthopedics;  Laterality: Left;  . TRIGGER FINGER RELEASE Right 03/06/2014   Procedure: RIGHT MIDDLE FINGER,RIGHT RINGER FINGER;  Surgeon: Daryll Brod, MD;  Location: [facility];  Service: Orthopedics;  Laterality: Right;    There were no vitals filed for this visit.   Subjective Assessment - 03/10/18 1017    Subjective  This 85yo male was referred for OT & PT evaluations with acute CVA on 02/15/2018 by Nicolette Bang, MD. Seward Carol, MD is PCP. He was hospitalized 02/13/2018-02/15/2018 with right lobe occipital CVA. His daughters came to stay with him for 1 week, then brother 2nd week and now friend is helping some.     Pertinent History  hx of CVA right hemiparesis in 2010, prostate CA, HTN, arthritis, Hepatitis,     Patient Stated Goals  To get balance & walking better.  Currently in Pain?  Yes    Pain Score  0-No pain   up to 6-7/10    Pain Location  Back    Pain Orientation  Mid;Lower    Pain Descriptors / Indicators  Aching    Pain Type  Chronic pain    Pain Radiating Towards  none    Pain Onset  More than a month ago    Pain Frequency  Intermittent    Aggravating Factors   sweeping, mopping, raking, etc.     Pain Relieving Factors  sitting down for ~30 minutes after activity         [facility] PT Assessment - 03/10/18 1015      Assessment   Medical Diagnosis  CVA    Referring Provider (PT)  Seward Carol, MD is PCP   Nicolette Bang, MD is hospitalist who referred   Onset Date/Surgical Date  02/13/18   CVA   Hand Dominance  Right    Prior  Therapy  OP PT for balance at time of new CVA      Precautions   Precautions  Fall    Precaution Comments  No driving      Balance Screen   Has the patient fallen in the past 6 months  No    Has the patient had a decrease in activity level because of a fear of falling?   Yes    Is the patient reluctant to leave their home because of a fear of falling?   No      Home Social worker  Private residence    Living Arrangements  Alone    Available Help at Discharge  Friend(s)    Type of [place] to enter    Entrance Stairs-Number of Steps  2    Entrance Stairs-Rails  Right;Left;Can reach both    Vivian  One level    Mutual - standard;Cane - single point;Tub bench;Grab bars - tub/shower;Hand held shower head      Prior Function   Level of Independence  Independent;Independent with household mobility without device;Independent with community mobility without device    Vocation  Retired    [employer]  was an Special educational needs teacher, [employer] club,       Posture/Postural Control   Posture/Postural Control  Postural limitations    Postural Limitations  Rounded Shoulders;Flexed trunk;Weight shift left      ROM / Strength   AROM / PROM / Strength  AROM;Strength      AROM   Overall AROM   Within functional limits for tasks performed      Strength   Overall Strength  Deficits    Overall Strength Comments  Left hip flexion, knee extension & ankle dorsiflexion 5/5    Strength Assessment Site  Hip;Knee;Ankle    Right Hip Flexion  4/5    Right Hip Extension  3/5   standing BUE support   Right Hip ABduction  3+/5   standing BUE support   Right/Left Knee  Right    Right Knee Flexion  3+/5   standing BUE support   Right Knee Extension  4/5    Right Ankle Dorsiflexion  4/5    Right Ankle Plantar Flexion  3-/5      Transfers   Transfers  Sit to Stand;Stand to Sit    Sit to Stand  6: Modified independent  (Device/Increase time);Without  upper extremity assist;From chair/3-in-1    Stand to Sit  6: Modified independent (Device/Increase time);Without upper extremity assist;To chair/3-in-1      Ambulation/Gait   Ambulation/Gait  Yes    Ambulation/Gait Assistance  5: Supervision    Ambulation Distance (Feet)  200 Feet    Assistive device  None    Gait Pattern  Step-through pattern;Decreased arm swing - right;Decreased step length - left;Decreased stride length;Decreased hip/knee flexion - right;Decreased weight shift to right;Right hip hike;Antalgic;Lateral hip instability    Ambulation Surface  Indoor;Level    Stairs  Yes    Stairs Assistance  5: Supervision    Stair Management Technique  One rail Left;Alternating pattern;Forwards    Number of Stairs  4      Standardized Balance Assessment   Standardized Balance Assessment  Berg Balance Test;Timed Up and Go Test      Berg Balance Test   Sit to Stand  Able to stand without using hands and stabilize independently    Standing Unsupported  Able to stand safely 2 minutes    Sitting with Back Unsupported but Feet Supported on Floor or Stool  Able to sit safely and securely 2 minutes    Stand to Sit  Sits safely with minimal use of hands    Transfers  Able to transfer safely, minor use of hands    Standing Unsupported with Eyes Closed  Able to stand 10 seconds safely    Standing Unsupported with Feet Together  Able to place feet together independently but unable to hold for 30 seconds    From Standing, Reach Forward with Outstretched Arm  Can reach confidently >25 cm (10")    From Standing Position, Pick up Object from Floor  Able to pick up shoe safely and easily    From Standing Position, Turn to Look Behind Over each Shoulder  Looks behind one side only/other side shows less weight shift    Turn 360 Degrees  Needs close supervision or verbal cueing    Standing Unsupported, Alternately Place Feet on Step/Stool  Able to complete >2 steps/needs  minimal assist    Standing Unsupported, One Foot in Front  Able to take small step independently and hold 30 seconds    Standing on One Leg  Tries to lift leg/unable to hold 3 seconds but remains standing independently    Total Score  42      Timed Up and Go Test   Normal TUG (seconds)  15.27    Cognitive TUG (seconds)  26.42   73% increase     Functional Gait  Assessment   Gait assessed   Yes    Gait Level Surface  Walks 20 ft, slow speed, abnormal gait pattern, evidence for imbalance or deviates 10-15 in outside of the 12 in walkway width. Requires more than 7 sec to ambulate 20 ft.    Change in Gait Speed  Makes only minor adjustments to walking speed, or accomplishes a change in speed with significant gait deviations, deviates 10-15 in outside the 12 in walkway width, or changes speed but loses balance but is able to recover and continue walking.    Gait with Horizontal Head Turns  Performs head turns with moderate changes in gait velocity, slows down, deviates 10-15 in outside 12 in walkway width but recovers, can continue to walk.    Gait with Vertical Head Turns  Performs task with moderate change in gait velocity, slows down, deviates 10-15 in outside 12 in walkway width but  recovers, can continue to walk.    Gait and Pivot Turn  Turns slowly, requires verbal cueing, or requires several small steps to catch balance following turn and stop    Step Over Obstacle  Is able to step over one shoe box (4.5 in total height) but must slow down and adjust steps to clear box safely. May require verbal cueing.    Gait with Narrow Base of Support  Ambulates less than 4 steps heel to toe or cannot perform without assistance.    Gait with Eyes Closed  Walks 20 ft, slow speed, abnormal gait pattern, evidence for imbalance, deviates 10-15 in outside 12 in walkway width. Requires more than 9 sec to ambulate 20 ft.    Ambulating Backwards  Walks 20 ft, slow speed, abnormal gait pattern, evidence for  imbalance, deviates 10-15 in outside 12 in walkway width.    Steps  Alternating feet, must use rail.    Total Score  10                Objective measurements completed on examination: See above findings.                PT Short Term Goals - 03/10/18 1805      PT SHORT TERM GOAL #1   Title  Patient demonstrates understanding of initial HEP. (All STGs Target Date: 04/08/2018)    Time  4    Period  Weeks    Status  New    Target Date  04/08/18      PT SHORT TERM GOAL #2   Title  Timed Up & Go without device <14 seconds.     Time  4    Period  Weeks    Status  New    Target Date  04/08/18      PT SHORT TERM GOAL #3   Title  Berg Balance >45/56 indicating lower fall risk.     Time  4    Period  Weeks    Status  New    Target Date  04/08/18      PT SHORT TERM GOAL #4   Title  Patient ambulates 300' indoors scanning environment without loss of balance.     Time  4    Period  Weeks    Status  New    Target Date  04/08/18        PT Long Term Goals - 03/10/18 1759      PT LONG TERM GOAL #1   Title  Patient verbalizes & demonstrates understanding of ongoing HEP & fitness plan. (All Long Term Goals Target Date:     Time  8    Period  Weeks    Status  New    Target Date  05/06/18      PT LONG TERM GOAL #2   Title  Timed Up & Go <13.5sec and cognitive TUG <18sec. to indicate lower fall risk.     Time  8    Period  Weeks    Status  New    Target Date  05/06/18      PT LONG TERM GOAL #3   Title  Berg Balance >/= 50/56 indicating lower fall risk.     Time  8    Period  Weeks    Status  New    Target Date  05/06/18      PT LONG TERM GOAL #4   Title  Functional Gait Assessment >16/30 indicating lower fall  risk.     Time  8    Period  Weeks    Status  New    Target Date  05/06/18      PT LONG TERM GOAL #5   Title  Patient ambulates >500' outdoors & negotiates ramps & curbs with cane or less modified independent.     Time  8    Period  Weeks     Status  New    Target Date  05/06/18             Plan - 03/10/18 1750    Clinical Impression Statement  This 85yo male was under the care of private practice PT due to balance issues. He had right lobe occipital CVA on 02/13/2018 and visual changes have further impaired his balance. He has weakness in RLE impairing mobility. Berg Balance 42/56 & Timed Up-Go 15.27sec both indicate high fall risk. Cognitive TUG increasing 73% from standard TUG indicates fall risk when distracted. Functional Gait Assessment 10/30 indicates high fall risk during gait. Gait Velocity of 2.40 ft/sec indicates limited community mobility. Patient would benefit from skilled PT to improve mobility & safety.     Personal Factors and Comorbidities  Age;Comorbidity 3+;Transportation    Comorbidities  2nd CVA, HTN, arthritis, OA, hx of bil total hip arthoplasty    Examination-Activity Limitations  Locomotion Level;Stairs;Stand;Transfers;Other   balance   Examination-Participation Restrictions  Church;Community Activity    Stability/Clinical Decision Making  Evolving/Moderate complexity    Clinical Decision Making  Moderate    Rehab Potential  Good    PT Frequency  2x / week    PT Duration  8 weeks    PT Treatment/Interventions  ADLs/Self Care Home Management;Canalith Repostioning;DME Instruction;Gait training;Stair training;Functional mobility training;Therapeutic activities;Therapeutic exercise;Balance training;Neuromuscular re-education;Patient/family education;Vestibular    PT Next Visit Plan  Initial HEP for balance & RLE strength,        Patient will benefit from skilled therapeutic intervention in order to improve the following deficits and impairments:  Abnormal gait, Decreased activity tolerance, Decreased balance, Decreased endurance, Decreased knowledge of use of DME, Decreased mobility, Decreased strength, Dizziness, Postural dysfunction, Pain  Visit Diagnosis: Unsteadiness on feet  Other abnormalities  of gait and mobility  Abnormal posture  Muscle weakness (generalized)     Problem List Patient Active Problem List   Diagnosis Date Noted  . Acute CVA (cerebrovascular accident) ([place]) 02/14/2018  . TIA (transient ischemic attack) 02/13/2018  . Hepatitis C reactive 02/06/2011  . Hyponatremia 02/06/2011  . Postoperative anemia due to acute blood loss 02/05/2011  . Hypokalemia 02/05/2011  . Osteoarthritis of hip 01/27/2011  . Hypertension 01/27/2011  . Sleep apnea 01/27/2011  . Stroke/cerebrovascular accident ([place]) 01/27/2011  . Cancer of prostate ([place]) 01/27/2011    Taydem Cavagnaro PT, DPT 03/10/2018, 6:09 PM  [facility] [address] [place], [place], [address] Phone: [phone]   Fax:  [phone]  Name: Brian Brown MRN: [id number] Date of Birth: 15-Dec-1932

## 2018-03-10 NOTE — Therapy (Signed)
[facility] [address] [place], [place], [address] Phone: [phone]   Fax:  [phone]  Occupational Therapy Evaluation  Patient Details  Name: Brian Brown MRN: [id number] Date of Birth: [birth year] Referring Provider (OT): Dr. Seward Carol (PCP)   Encounter Date: 03/10/2018  OT End of Session - 03/10/18 1631    Visit Number  1    Number of Visits  8    Date for OT Re-Evaluation  04/07/18    Authorization Type  Medicare and BCBS - will need progress note every 10th session    Authorization Time Period  90 days    Authorization - Visit Number  1    Authorization - Number of Visits  8    OT Start Time  1100    OT Stop Time  1151    OT Time Calculation (min)  51 min    Activity Tolerance  Patient tolerated treatment well    Behavior During Therapy  [facility] for tasks assessed/performed       Past Medical History:  Diagnosis Date  . Arthritis    degenerative joint disease  . Bleeding stomach ulcer 25 yrs ago  . Cancer [name]) 2002   prostate, s/p surgery  . Hepatitis 1981   does not know what kind;denies jaundice  . Hypertension    treated by Dr Delfina Redwood, primary MD  . Sleep apnea 2005   does not wear a Cpap  . Stroke ([place]) 2003   w/ Rt sided weakness-some-rt leg  . Wears dentures    full top  . Wears glasses     Past Surgical History:  Procedure Laterality Date  . CARPAL TUNNEL RELEASE Left 03/29/2013   Procedure: LEFT CARPAL TUNNEL RELEASE;  Surgeon: Wynonia Sours, MD;  Location: [place];  Service: Orthopedics;  Laterality: Left;  ANESTHESIA: IV REGIONAL FAB  . CARPAL TUNNEL RELEASE Right 03/06/2014   Procedure: RIGHT CARPAL TUNNEL RELEASE;  Surgeon: Daryll Brod, MD;  Location: [place];  Service: Orthopedics;  Laterality: Right;  . COLONOSCOPY    . HERNIA REPAIR  2009   rt inguinal hernia  . JOINT REPLACEMENT  12/2001   rt hip ;[place]  2002   . SHOULDER ARTHROSCOPY W/ ROTATOR CUFF REPAIR  2014   lt-dr whitfield  . TOTAL HIP ARTHROPLASTY  02/03/2011   Procedure: TOTAL HIP ARTHROPLASTY;  Surgeon: Garald Balding, MD;  Location: [place];  Service: Orthopedics;  Laterality: Left;  . TRIGGER FINGER RELEASE Left 03/29/2013   Procedure: LEFT MIDDLE/SMALL FINGER RELEASE A-1 PULLEY AND LEFT RING FINGER;  Surgeon: Wynonia Sours, MD;  Location: [place];  Service: Orthopedics;  Laterality: Left;  . TRIGGER FINGER RELEASE Right 03/06/2014   Procedure: RIGHT MIDDLE FINGER,RIGHT RINGER FINGER;  Surgeon: Daryll Brod, MD;  Location: [place] [facility];  Service: Orthopedics;  Laterality: Right;    There were no vitals filed for this visit.  Subjective Assessment - 03/10/18 1108    Subjective   I would like to get back to driving; I've noticed I have to turn my head to see items on the left     Pertinent History  Acute R CVA 02/13/18, previously was going to OPPT for balance. PMHx: previous CVA with residual R hemiparesis (2003), prostate CA, sleep apnea, HTN, arthritis    Patient Stated Goals  to get back to driving; work on balance     Currently in Pain?  No/denies        [facility] OT Assessment - 03/10/18 1111      Assessment   Medical Diagnosis  R acute CVA    Referring Provider (OT)  Dr. Seward Carol (PCP)    Onset Date/Surgical Date  02/13/18    Hand Dominance  Right    Prior Therapy  was receiving OP PT prior to admit to hospital, saw acute care PT/OT with most recent admisison      Precautions   Precautions  Fall    Precaution Comments  recommend no driving      Restrictions   Weight Bearing Restrictions  No      Balance Screen   Has the patient fallen in the past 6 months  No      Home  Environment   Family/patient expects to be discharged to:  Private residence    Living Arrangements  Other(Comment)   neighbor was staying past week, "can stay as long as needed"   Available Help at Discharge  Friend(s)     Type of [place]  One level    Bathroom Shower/Tub  Tub/Shower unit    [place] bars - tub/shower;Shower seat;Cane - single point;Bedside commode   walking stick    Lives With  Alone      Prior Function   Level of Independence  Independent;Independent with household mobility without device;Independent with community mobility without device    Vocation  Retired    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, [employer] club,       ADL   Eating/Feeding  Independent    Grooming  Independent    Armed forces training and education officer Dressing  Increased time   reports difficulty with buttons   Lower Body Dressing  Increased time   reports difficulty with Optician, dispensing  Modified independent   increased time     IADL   Prior Level of Veterinary surgeon for transportation    Prior Level of Function Light Housekeeping  independent    Light Housekeeping  Does personal laundry completely    Prior Level of Function Meal Prep  independent    Meal Prep  Plans, prepares and serves adequate meals independently   pt reports not seeing L button on stove   Prior Level of Function Community Mobility  independently driving    [place] on family or friends for transportation   currently assist from neighbor   Prior Level of Function Medication Managment  independent    Medication Management  Is responsible for taking medication in correct dosages at correct time;Has difficulty remembering to take medication   reports if AM schedule is altered he sometimes forgets   Psychiatrist financial matters independently (budgets, writes checks, pays rent, bills goes to bank), collects and keeps track of income       Mobility   Mobility Status  Needs assist    Mobility Status Comments  using SPC, seeing PT to address      Written Expression   Dominant Hand  Right    Written Experience  Within Functional Limits   self-report  Vision - History   Baseline Vision  Wears glasses only for reading   for small print   Patient Visual Report  Other (comment)   darkness in L visual field, reports visual hallucinations   Additional Comments  pt reports he sees objects/visual hallucinations (tall buildings on L side when driving, white sheet over items in store, black curtain and green plant in kitchen); aware they're not real       Vision Assessment   Eye Alignment  Within Functional Limits    Vision Assessment  Vision tested    Ocular Range of Motion  Within Functional Limits    Alignment/Gaze Preference  Within Defined Limits    Tracking/Visual Pursuits  Decreased smoothing of vertical tracking    Visual Fields  Left homonymous Hemianopsia    Comment  --      Activity Tolerance   Activity Tolerance  Tolerate 30+ min activity without fatigue   per pt report     Cognition   Overall Cognitive Status  Cognition to be further assessed in functional context PRN      Sensation   Light Touch  Impaired by gross assessment   fingertips     Coordination   Gross Motor Movements are Fluid and Coordinated  Yes   mild discoordination noted   Fine Motor Movements are Fluid and Coordinated  --   to be assessed      Tone   Assessment Location  Right Upper Extremity;Left Upper Extremity      ROM / Strength   AROM / PROM / Strength  AROM;Strength      AROM   Overall AROM   Within functional limits for tasks performed      Strength   Overall Strength  Within functional limits for tasks performed      Hand Function   Right Hand Gross Grasp  Functional    Right Hand Grip (lbs)  69    Left Hand Gross Grasp  Functional    Left Hand Grip (lbs)  65      RUE Tone   RUE Tone  Mild      LUE Tone   LUE  Tone  Mild                      OT Education - 03/10/18 1259    Education Details  provided contact information for neurologist to set up follow up appt    Person(s) Educated  Patient    Methods  Handout    Comprehension  Verbalized understanding          OT Long Term Goals - 03/10/18 1301      OT LONG TERM GOAL #1   Title  Pt will participate in 9 hole peg test to establish fine motor/coordination goal if deemed appropriate. - 04/07/2018    Status  New      OT LONG TERM GOAL #2   Title  Pt will verbalize understanding of return to driving recommendations.     Status  New      OT LONG TERM GOAL #3   Title  Pt will perform familiar warm meal prep task mod I    Status  New      OT LONG TERM GOAL #4   Title  Pt will demonstrate understanding of visual compensatory strategies fro scanning with reading and mobility..     Status  New      OT LONG TERM GOAL #5  Title  Pt will verbalize understanding of AE for tying shoes and buttoning.     Status  New      Long Term Additional Goals   Additional Long Term Goals  Yes      OT LONG TERM GOAL #6   Title  Pt will be mod I with home activities program for visual compensation and coordination (in indicated)    Status  New      OT LONG TERM GOAL #7   Title  Pt will be able to read 3 sentence paragraph with strategies prn    Status  New      OT LONG TERM GOAL #8   Title  Pt will be demonstrate 90% accuracy for environmental scanning activity in prep for community mobility    Status  New            Plan - 03/10/18 1310    Clinical Impression Statement  Pt is a 85 y/o male with acute R CVA on 02/13/2018, discharged from hospital on 02/15/2018. PMHx including previous CVA with residual R side hemiparesis, prostate CA, sleep apnea, HTN, arthritis. Pt presenting with L homonymous hemianopsia, reports of visual perceptual difficulties and visual hallucinations, mild hypertonicity in bil UEs and decreased coordination  impacting his functional performance in ADL and iADL. Pt was previously performing ADL and iADL at independent-mod independent level.     Occupational performance deficits (Please refer to evaluation for details):  ADL's;IADL's;Leisure;Social Participation    Body Structure / Function / Physical Skills  ADL;Tone;Body mechanics;UE functional use;IADL;Vision;Coordination;Sensation;Mobility;GMC;Decreased knowledge of use of DME;FMC    Rehab Potential  Good    Clinical Decision Making  Several treatment options, min-mod task modification necessary    Comorbidities Affecting Occupational Performance:  May have comorbidities impacting occupational performance    Modification or Assistance to Complete Evaluation   Min-Moderate modification of tasks or assist with assess necessary to complete eval    OT Frequency  2x / week    OT Duration  4 weeks    OT Treatment/Interventions  Self-care/ADL training;DME and/or AE instruction;Balance training;Therapeutic activities;Therapeutic exercise;Psychosocial skills training;Cognitive remediation/compensation;Coping strategies training;Neuromuscular education;Visual/perceptual remediation/compensation;Patient/family education    Plan  complete 9 hole peg test and establish fine motor/coordination goal PRN; address visual compensatory strategies within functional context;     Consulted and Agree with Plan of Care  Patient       Patient will benefit from skilled therapeutic intervention in order to improve the following deficits and impairments:  Body Structure / Function / Physical Skills  Visit Diagnosis: Visuospatial deficit - Plan: Ot plan of care cert/re-cert  Other lack of coordination - Plan: Ot plan of care cert/re-cert    Problem List Patient Active Problem List   Diagnosis Date Noted  . Acute CVA (cerebrovascular accident) ([place]) 02/14/2018  . TIA (transient ischemic attack) 02/13/2018  . Hepatitis C reactive 02/06/2011  . Hyponatremia 02/06/2011   . Postoperative anemia due to acute blood loss 02/05/2011  . Hypokalemia 02/05/2011  . Osteoarthritis of hip 01/27/2011  . Hypertension 01/27/2011  . Sleep apnea 01/27/2011  . Stroke/cerebrovascular accident ([place]) 01/27/2011  . Cancer of prostate ([place]) 01/27/2011    Raymondo Band, OTR/L  03/10/2018, 4:39 PM  [facility] [address] [place], [place], [address] Phone: [phone]   Fax:  [phone]  Name: Brian Brown MRN: [id number] Date of Birth: 1932/12/23

## 2018-03-14 ENCOUNTER — Ambulatory Visit: Payer: Medicare Other | Admitting: Occupational Therapy

## 2018-03-14 ENCOUNTER — Encounter: Payer: Self-pay | Admitting: Occupational Therapy

## 2018-03-14 DIAGNOSIS — R293 Abnormal posture: Secondary | ICD-10-CM

## 2018-03-14 DIAGNOSIS — M6281 Muscle weakness (generalized): Secondary | ICD-10-CM | POA: Diagnosis not present

## 2018-03-14 DIAGNOSIS — R2681 Unsteadiness on feet: Secondary | ICD-10-CM

## 2018-03-14 DIAGNOSIS — R41842 Visuospatial deficit: Secondary | ICD-10-CM

## 2018-03-14 DIAGNOSIS — R2689 Other abnormalities of gait and mobility: Secondary | ICD-10-CM | POA: Diagnosis not present

## 2018-03-14 DIAGNOSIS — R278 Other lack of coordination: Secondary | ICD-10-CM

## 2018-03-14 NOTE — Therapy (Signed)
[facility] [address] [place], [place], [address] Phone: [phone]   Fax:  [phone]  Occupational Therapy Treatment  Patient Details  Name: Brian Brown MRN: [id number] Date of Birth: [birth year] Referring Provider (OT): Dr. Seward Carol (PCP)   Encounter Date: 03/14/2018  OT End of Session - 03/14/18 1227    Visit Number  2    Number of Visits  8    Date for OT Re-Evaluation  04/07/18    Authorization Type  Medicare and BCBS - will need progress note every 10th session    Authorization Time Period  90 days    Authorization - Visit Number  2    Authorization - Number of Visits  8    OT Start Time  1102    OT Stop Time  1150    OT Time Calculation (min)  48 min    Activity Tolerance  --       Past Medical History:  Diagnosis Date  . Arthritis    degenerative joint disease  . Bleeding stomach ulcer 25 yrs ago  . Cancer [facility]) 2002   prostate, s/p surgery  . Hepatitis 1981   does not know what kind;denies jaundice  . Hypertension    treated by Dr Delfina Redwood, primary MD  . Sleep apnea 2005   does not wear a Cpap  . Stroke ([place]) 2003   w/ Rt sided weakness-some-rt leg  . Wears dentures    full top  . Wears glasses     Past Surgical History:  Procedure Laterality Date  . CARPAL TUNNEL RELEASE Left 03/29/2013   Procedure: LEFT CARPAL TUNNEL RELEASE;  Surgeon: Wynonia Sours, MD;  Location: [facility];  Service: Orthopedics;  Laterality: Left;  ANESTHESIA: IV REGIONAL FAB  . CARPAL TUNNEL RELEASE Right 03/06/2014   Procedure: RIGHT CARPAL TUNNEL RELEASE;  Surgeon: Daryll Brod, MD;  Location: [facility];  Service: Orthopedics;  Laterality: Right;  . COLONOSCOPY    . HERNIA REPAIR  2009   rt inguinal hernia  . JOINT REPLACEMENT  12/2001   rt hip ;[place]  2002  . SHOULDER ARTHROSCOPY W/ ROTATOR CUFF REPAIR  2014   lt-dr whitfield  . TOTAL HIP  ARTHROPLASTY  02/03/2011   Procedure: TOTAL HIP ARTHROPLASTY;  Surgeon: Garald Balding, MD;  Location: [facility];  Service: Orthopedics;  Laterality: Left;  . TRIGGER FINGER RELEASE Left 03/29/2013   Procedure: LEFT MIDDLE/SMALL FINGER RELEASE A-1 PULLEY AND LEFT RING FINGER;  Surgeon: Wynonia Sours, MD;  Location: [facility];  Service: Orthopedics;  Laterality: Left;  . TRIGGER FINGER RELEASE Right 03/06/2014   Procedure: RIGHT MIDDLE FINGER,RIGHT RINGER FINGER;  Surgeon: Daryll Brod, MD;  Location: [facility];  Service: Orthopedics;  Laterality: Right;    There were no vitals filed for this visit.  Subjective Assessment - 03/14/18 1105    Subjective   I have been trying to read my newspaper its sometimes hard to find the beginnning of the next line    Pertinent History  Acute R CVA 02/13/18, previously was going to OPPT for balance. PMHx: previous CVA with residual R hemiparesis (2003), prostate CA, sleep apnea, HTN, arthritis    Patient Stated Goals  to get back to driving; work on balance     Currently in Pain?  Yes    Pain Score  2     Pain Location  Head  Pain Orientation  Anterior;Left    Pain Descriptors / Indicators  Dull    Pain Type  Acute pain    Pain Onset  Today   just in the last 30 minutes   Pain Frequency  Constant    Aggravating Factors   not sure my nose is a little runny too today    Pain Relieving Factors  not sure it just started.                    OT Treatments/Exercises (OP) - 03/14/18 0001      ADLs   UB Dressing  Demonstrateted use of button hook and pt able to return demonstrate.  Also demonstrated set up and use of elastic shoes laces. Pt given info about where to obtain n writing - pt to follow up as he desires.     ADL Comments  Reviewed goals with pt and POC. Pt in agreement and written copy of goals provided.  Also assessed coordination via 9 hole peg test R= [id number], Pt reports he has had a tremor in L hand for  past 5 years and does not feel tremor is any worse. Incoordination in R hand due to prior stroke. Therefore will not set goal but will address areas of difficulty (tying shoes and buttoning)      Visual/Perceptual Exercises   Other Exercises  Addressed visual scanning via reading first using large print and then with smaller print.  Used red guide along L side of page as welll as trialed reading guides. Pt preferred red guide along  L edge and performed better with guide than with reading guides or with nothing.  Red edge guide along L side was assisted pt in finding beginning of next line and reading comprehension was improved. Pt issued red guide and given reading newspaper at home using guide as homework. Pt demonstrated  use of guide mod I after practice.              OT Education - 03/14/18 1224    Education Details  reading guide, OT goals and POC, AE for buttoning and tying shoes          OT Long Term Goals - 03/14/18 1225      OT LONG TERM GOAL #1   Title  Pt will participate in 9 hole peg test to establish fine motor/coordination goal if deemed appropriate. - 04/07/2018    Status  Achieved   R= 43.41, L=1.13.66. 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     Status  On-going      OT LONG TERM GOAL #3   Title  Pt will perform familiar warm meal prep task mod I    Status  On-going      OT LONG TERM GOAL #4   Title  Pt will demonstrate understanding of visual compensatory strategies fro scanning with reading and mobility..     Status  On-going      OT LONG TERM GOAL #5   Title  Pt will verbalize understanding of AE for tying shoes and buttoning.     Status  Achieved      OT LONG TERM GOAL #6   Title  Pt will be mod I with home activities program for visual compensation  and  coordination (in indicated)    Status  On-going      OT LONG TERM GOAL #7   Title  Pt will be able to read 3 sentence paragraph with strategies prn    Status  On-going      OT LONG TERM GOAL #8   Title  Pt will be demonstrate 90% accuracy for environmental scanning activity in prep for community mobility    Status  On-going            Plan - 03/14/18 1226    Clinical Impression Statement  Pt progressing toward goals - pt in agreement with OT goals and POC    Occupational performance deficits (Please refer to evaluation for details):  ADL's;IADL's;Leisure;Social Participation    Body Structure / Function / Physical Skills  ADL;Tone;Body mechanics;UE functional use;IADL;Vision;Coordination;Sensation;Mobility;GMC;Decreased knowledge of use of DME;FMC    Rehab Potential  Good    Comorbidities Affecting Occupational Performance:  May have comorbidities impacting occupational performance    OT Frequency  2x / week    OT Duration  4 weeks    OT Treatment/Interventions  Self-care/ADL training;DME and/or AE instruction;Balance training;Therapeutic activities;Therapeutic exercise;Psychosocial skills training;Cognitive remediation/compensation;Coping strategies training;Neuromuscular education;Visual/perceptual remediation/compensation;Patient/family education    Plan  address visual compensatory strategies within functional context; table top scanning, environmental scanning. Provide info on neuro opthamology recommendation.    Consulted and Agree with Plan of Care  Patient       Patient will benefit from skilled therapeutic intervention in order to improve the following deficits and impairments:     Visit Diagnosis: Visuospatial deficit  Other lack of coordination  Unsteadiness on feet  Abnormal posture  Muscle weakness (generalized)    Problem List Patient Active Problem List   Diagnosis Date Noted  . Acute CVA (cerebrovascular accident) ([place]) 02/14/2018  . TIA (transient  ischemic attack) 02/13/2018  . Hepatitis C reactive 02/06/2011  . Hyponatremia 02/06/2011  . Postoperative anemia due to acute blood loss 02/05/2011  . Hypokalemia 02/05/2011  . Osteoarthritis of hip 01/27/2011  . Hypertension 01/27/2011  . Sleep apnea 01/27/2011  . Stroke/cerebrovascular accident ([place]) 01/27/2011  . Cancer of prostate [facility]) 01/27/2011    Quay Burow, OTR/L 03/14/2018, 12:29 PM  [facility] [address] [place], [place], [address] Phone: [phone]   Fax:  [phone]  Name: JAMOL GINYARD MRN: [id number] Date of Birth: Mar 31, 1932

## 2018-03-17 ENCOUNTER — Ambulatory Visit: Payer: Medicare Other | Admitting: Occupational Therapy

## 2018-03-17 ENCOUNTER — Encounter: Payer: Self-pay | Admitting: Occupational Therapy

## 2018-03-17 DIAGNOSIS — R41842 Visuospatial deficit: Secondary | ICD-10-CM

## 2018-03-17 DIAGNOSIS — R293 Abnormal posture: Secondary | ICD-10-CM | POA: Diagnosis not present

## 2018-03-17 DIAGNOSIS — R2689 Other abnormalities of gait and mobility: Secondary | ICD-10-CM | POA: Diagnosis not present

## 2018-03-17 DIAGNOSIS — R2681 Unsteadiness on feet: Secondary | ICD-10-CM | POA: Diagnosis not present

## 2018-03-17 DIAGNOSIS — M6281 Muscle weakness (generalized): Secondary | ICD-10-CM | POA: Diagnosis not present

## 2018-03-17 DIAGNOSIS — R278 Other lack of coordination: Secondary | ICD-10-CM | POA: Diagnosis not present

## 2018-03-17 NOTE — Therapy (Signed)
[facility] [address] [place], [place], [address] Phone: [phone]   Fax:  [phone]  Occupational Therapy Treatment  Patient Details  Name: Brian Brown MRN: [id number] Date of Birth: 07-Dec-1932 Referring Provider (OT): Dr. Seward Carol (PCP)   Encounter Date: 03/17/2018  OT End of Session - 03/17/18 1206    Visit Number  3    Number of Visits  8    Date for OT Re-Evaluation  04/07/18    Authorization Type  Medicare and BCBS - will need progress note every 10th session    Authorization Time Period  90 days    Authorization - Visit Number  3    Authorization - Number of Visits  8    OT Start Time  1016    OT Stop Time  1102    OT Time Calculation (min)  46 min    Activity Tolerance  Patient tolerated treatment well    Behavior During Therapy  [facility] for tasks assessed/performed       Past Medical History:  Diagnosis Date  . Arthritis    degenerative joint disease  . Bleeding stomach ulcer 25 yrs ago  . Cancer [facility]) 2002   prostate, s/p surgery  . Hepatitis 1981   does not know what kind;denies jaundice  . Hypertension    treated by Dr Delfina Redwood, primary MD  . Sleep apnea 2005   does not wear a Cpap  . Stroke ([place]) 2003   w/ Rt sided weakness-some-rt leg  . Wears dentures    full top  . Wears glasses     Past Surgical History:  Procedure Laterality Date  . CARPAL TUNNEL RELEASE Left 03/29/2013   Procedure: LEFT CARPAL TUNNEL RELEASE;  Surgeon: Wynonia Sours, MD;  Location: [place];  Service: Orthopedics;  Laterality: Left;  ANESTHESIA: IV REGIONAL FAB  . CARPAL TUNNEL RELEASE Right 03/06/2014   Procedure: RIGHT CARPAL TUNNEL RELEASE;  Surgeon: Daryll Brod, MD;  Location: [place];  Service: Orthopedics;  Laterality: Right;  . COLONOSCOPY    . HERNIA REPAIR  2009   rt inguinal hernia  . JOINT REPLACEMENT  12/2001   rt hip ;[place]  2002   . SHOULDER ARTHROSCOPY W/ ROTATOR CUFF REPAIR  2014   lt-dr [place]  . TOTAL HIP ARTHROPLASTY  02/03/2011   Procedure: TOTAL HIP ARTHROPLASTY;  Surgeon: Garald Balding, MD;  Location: [place];  Service: Orthopedics;  Laterality: Left;  . TRIGGER FINGER RELEASE Left 03/29/2013   Procedure: LEFT MIDDLE/SMALL FINGER RELEASE A-1 PULLEY AND LEFT RING FINGER;  Surgeon: Wynonia Sours, MD;  Location: [place];  Service: Orthopedics;  Laterality: Left;  . TRIGGER FINGER RELEASE Right 03/06/2014   Procedure: RIGHT MIDDLE FINGER,RIGHT RINGER FINGER;  Surgeon: Daryll Brod, MD;  Location: [place];  Service: Orthopedics;  Laterality: Right;    There were no vitals filed for this visit.  Subjective Assessment - 03/17/18 1019    Subjective   I think things are improving a little bit; I still am seeing those strange sceneries at times.    Pertinent History  Acute R CVA 02/13/18, previously was going to OPPT for balance. PMHx: previous CVA with residual R hemiparesis (2003), prostate CA, sleep apnea, HTN, arthritis    Patient Stated Goals  to get back to driving; work on balance     Currently in Pain?  No/denies  Pt performing tabletop scanning activity, completed x3 with 29M, 77M, and 80M font size. Activities including scanning and crossing out numbers, various letters, as well as scanning to determine specific amount of digits present in each line. Pt with 3 errors each during first two activities, 4 errors in 3rd activity; overall pt requiring min questioning cues to correct - mod difficulty with correcting 3rd activity and use of additional strategies (underlinining digits while scanning) to successfully complete.   Engaged in environmental scanning activity locating cards within L/R visual field. Pt completing x2 trials in more quiet environment of hallway, completed third trial in therapy gym for increased challenge to external environment. Pt requires mod  questioning cues to locate cards accurately, with min difficulty performing in hallway and mod difficulty performing in gym given increased time to do so.  Provided pt with contact information of local neuro-opthamologist to make appointment as precursor to return to driving. Educated pt to set appointment date for approx 6-8 weeks out to allow brain more time to heal. Pt verbalizing understanding.                 OT Education - 03/17/18 1205    Education Details  provided contact information of neuro opthamologist to set up appointment as precursor to return to driving; continued education of visual scanning strategies and use of head turns when moving throughout environment    Person(s) Educated  Patient    Methods  Explanation;Demonstration;Handout    Comprehension  Verbalized understanding;Returned demonstration          OT Long Term Goals - 03/14/18 1225      OT LONG TERM GOAL #1   Title  Pt will participate in 9 hole peg test to establish fine motor/coordination goal if deemed appropriate. - 04/07/2018    Status  Achieved   R= 43.41, L=1.13.66. 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     Status  On-going      OT LONG TERM GOAL #3   Title  Pt will perform familiar warm meal prep task mod I    Status  On-going      OT LONG TERM GOAL #4   Title  Pt will demonstrate understanding of visual compensatory strategies fro scanning with reading and mobility..     Status  On-going      OT LONG TERM GOAL #5   Title  Pt will verbalize understanding of AE for tying shoes and buttoning.     Status  Achieved      OT LONG TERM GOAL #6   Title  Pt will be mod I with home activities program for visual compensation and coordination (in indicated)    Status  On-going      OT LONG TERM  GOAL #7   Title  Pt will be able to read 3 sentence paragraph with strategies prn    Status  On-going      OT LONG TERM GOAL #8   Title  Pt will be demonstrate 90% accuracy for environmental scanning activity in prep for community mobility    Status  On-going            Plan - 03/17/18 1207    Clinical Impression Statement  Pt progressing towards goals, improvements noted in ability  to utilize visual scanning strategies both in tabletop and envinronmental settings.     Occupational performance deficits (Please refer to evaluation for details):  ADL's;IADL's;Leisure;Social Participation    Body Structure / Function / Physical Skills  ADL;Tone;Body mechanics;UE functional use;IADL;Vision;Coordination;Sensation;Mobility;GMC;Decreased knowledge of use of DME;FMC    OT Frequency  2x / week    OT Duration  4 weeks    OT Treatment/Interventions  Self-care/ADL training;DME and/or AE instruction;Balance training;Therapeutic activities;Therapeutic exercise;Psychosocial skills training;Cognitive remediation/compensation;Coping strategies training;Neuromuscular education;Visual/perceptual remediation/compensation;Patient/family education    Plan  continue to address visual scanning strategies within tabletop and environmental context, practice strategies within functional context (simple meal prep)     Consulted and Agree with Plan of Care  Patient       Patient will benefit from skilled therapeutic intervention in order to improve the following deficits and impairments:  Body Structure / Function / Physical Skills  Visit Diagnosis: Visuospatial deficit  Other lack of coordination  Unsteadiness on feet  Abnormal posture  Muscle weakness (generalized)    Problem List Patient Active Problem List   Diagnosis Date Noted  . Acute CVA (cerebrovascular accident) ([place]) 02/14/2018  . TIA (transient ischemic attack) 02/13/2018  . Hepatitis C reactive 02/06/2011  . Hyponatremia 02/06/2011   . Postoperative anemia due to acute blood loss 02/05/2011  . Hypokalemia 02/05/2011  . Osteoarthritis of hip 01/27/2011  . Hypertension 01/27/2011  . Sleep apnea 01/27/2011  . Stroke/cerebrovascular accident ([place]) 01/27/2011  . Cancer of prostate ([place]) 01/27/2011    Raymondo Band, OTR/L 03/17/2018, 12:20 PM  [name] [address] [place], [place], [address] Phone: [phone]   Fax:  [phone]  Name: Brian Brown MRN: [id number] Date of Birth: 1932-02-27

## 2018-03-21 ENCOUNTER — Ambulatory Visit: Payer: Medicare Other | Admitting: Occupational Therapy

## 2018-03-21 ENCOUNTER — Encounter: Payer: Self-pay | Admitting: Occupational Therapy

## 2018-03-21 DIAGNOSIS — R293 Abnormal posture: Secondary | ICD-10-CM | POA: Diagnosis not present

## 2018-03-21 DIAGNOSIS — R41842 Visuospatial deficit: Secondary | ICD-10-CM | POA: Diagnosis not present

## 2018-03-21 DIAGNOSIS — R2689 Other abnormalities of gait and mobility: Secondary | ICD-10-CM | POA: Diagnosis not present

## 2018-03-21 DIAGNOSIS — R2681 Unsteadiness on feet: Secondary | ICD-10-CM

## 2018-03-21 DIAGNOSIS — R278 Other lack of coordination: Secondary | ICD-10-CM | POA: Diagnosis not present

## 2018-03-21 DIAGNOSIS — M6281 Muscle weakness (generalized): Secondary | ICD-10-CM | POA: Diagnosis not present

## 2018-03-21 NOTE — Therapy (Signed)
[facility] [address] [place], [place], [address] Phone: [phone]   Fax:  [phone]  Occupational Therapy Treatment  Patient Details  Name: Brian Brown MRN: [id number] Date of Birth: 02/10/1932 Referring Provider (OT): Dr. Seward Carol (PCP)   Encounter Date: 03/21/2018  OT End of Session - 03/21/18 1311    Visit Number  4    Number of Visits  8    Date for OT Re-Evaluation  04/07/18    Authorization Type  Medicare and BCBS - will need progress note every 10th session    Authorization Time Period  90 days    Authorization - Visit Number  4    Authorization - Number of Visits  10    OT Start Time  1018    OT Stop Time  1100    OT Time Calculation (min)  42 min    Activity Tolerance  Patient tolerated treatment well       Past Medical History:  Diagnosis Date  . Arthritis    degenerative joint disease  . Bleeding stomach ulcer 25 yrs ago  . Cancer [facility]) 2002   prostate, s/p surgery  . Hepatitis 1981   does not know what kind;denies jaundice  . Hypertension    treated by Dr Delfina Redwood, primary MD  . Sleep apnea 2005   does not wear a Cpap  . Stroke ([place]) 2003   w/ Rt sided weakness-some-rt leg  . Wears dentures    full top  . Wears glasses     Past Surgical History:  Procedure Laterality Date  . CARPAL TUNNEL RELEASE Left 03/29/2013   Procedure: LEFT CARPAL TUNNEL RELEASE;  Surgeon: Wynonia Sours, MD;  Location: [place];  Service: Orthopedics;  Laterality: Left;  ANESTHESIA: IV REGIONAL FAB  . CARPAL TUNNEL RELEASE Right 03/06/2014   Procedure: RIGHT CARPAL TUNNEL RELEASE;  Surgeon: Daryll Brod, MD;  Location: [place];  Service: Orthopedics;  Laterality: Right;  . COLONOSCOPY    . HERNIA REPAIR  2009   rt inguinal hernia  . JOINT REPLACEMENT  12/2001   rt hip ;[place]  2002  . SHOULDER ARTHROSCOPY W/ ROTATOR CUFF REPAIR  2014   lt-dr  [place]  . TOTAL HIP ARTHROPLASTY  02/03/2011   Procedure: TOTAL HIP ARTHROPLASTY;  Surgeon: Garald Balding, MD;  Location: [place];  Service: Orthopedics;  Laterality: Left;  . TRIGGER FINGER RELEASE Left 03/29/2013   Procedure: LEFT MIDDLE/SMALL FINGER RELEASE A-1 PULLEY AND LEFT RING FINGER;  Surgeon: Wynonia Sours, MD;  Location: [place];  Service: Orthopedics;  Laterality: Left;  . TRIGGER FINGER RELEASE Right 03/06/2014   Procedure: RIGHT MIDDLE FINGER,RIGHT RINGER FINGER;  Surgeon: Daryll Brod, MD;  Location: [place];  Service: Orthopedics;  Laterality: Right;    There were no vitals filed for this visit.  Subjective Assessment - 03/21/18 1021    Subjective   I think my vision might be improving just a bit.     Pertinent History  Acute R CVA 02/13/18, previously was going to OPPT for balance. PMHx: previous CVA with residual R hemiparesis (2003), prostate CA, sleep apnea, HTN, arthritis    Patient Stated Goals  to get back to driving; work on balance     Currently in Pain?  No/denies                   OT Treatments/Exercises (OP) -  03/21/18 0001      Visual/Perceptual Exercises   Other Exercises  Addressed visual scanning and visual attention using letter cancellation at 1.5 M level - pt with only 2 errors and able to find L edge and next line down without diffculty today.  Also addresesd table top visual scanning using Trails A and Trails B as well as symbol digits modalities . Pt able to find all targets and maintain sequence with extra time. Addressed column reading using red edge as left guide only - pt able to read several paragraphs and use red edge to assist in keeping place in columns. Pt with good comprehension of reading material and improved ablity to find the next line down. Red edge facilitates this and pt is using at home to read the newspaper daily.                   OT Long Term Goals - 03/21/18 1309      OT LONG  TERM GOAL #1   Title  Pt will participate in 9 hole peg test to establish fine motor/coordination goal if deemed appropriate. - 04/07/2018    Status  Achieved   R= 43.41, L=1.13.66. 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     Status  On-going      OT LONG TERM GOAL #3   Title  Pt will perform familiar warm meal prep task mod I    Status  On-going      OT LONG TERM GOAL #4   Title  Pt will demonstrate understanding of visual compensatory strategies fro scanning with reading and mobility..     Status  On-going      OT LONG TERM GOAL #5   Title  Pt will verbalize understanding of AE for tying shoes and buttoning.     Status  Achieved      OT LONG TERM GOAL #6   Title  Pt will be mod I with home activities program for visual compensation and coordination (in indicated)    Status  Achieved      OT LONG TERM GOAL #7   Title  Pt will be able to read 3 sentence paragraph with strategies prn    Status  Achieved      OT LONG TERM GOAL #8   Title  Pt will be demonstrate 90% accuracy for environmental scanning activity in prep for community mobility    Status  On-going            Plan - 03/21/18 1310    Clinical Impression Statement  Pt progressing towards goals Pt with improved reading and reading comprehension    Occupational performance deficits (Please refer to evaluation for details):  ADL's;IADL's;Leisure;Social Participation    Body Structure / Function / Physical Skills  ADL;Tone;Body mechanics;UE functional use;IADL;Vision;Coordination;Sensation;Mobility;GMC;Decreased knowledge of use of DME;FMC    Rehab Potential  Good    OT Frequency  2x / week    OT Duration  4 weeks    OT Treatment/Interventions  Self-care/ADL training;DME and/or AE instruction;Balance training;Therapeutic  activities;Therapeutic exercise;Psychosocial skills training;Cognitive remediation/compensation;Coping strategies training;Neuromuscular education;Visual/perceptual remediation/compensation;Patient/family education    Plan  continue to address visual scanning strategies with environmental scanning,  practice strategies within functional context (simple meal prep)     Consulted  and Agree with Plan of Care  Patient       Patient will benefit from skilled therapeutic intervention in order to improve the following deficits and impairments:  Body Structure / Function / Physical Skills  Visit Diagnosis: Visuospatial deficit  Unsteadiness on feet    Problem List Patient Active Problem List   Diagnosis Date Noted  . Acute CVA (cerebrovascular accident) ([place]) 02/14/2018  . TIA (transient ischemic attack) 02/13/2018  . Hepatitis C reactive 02/06/2011  . Hyponatremia 02/06/2011  . Postoperative anemia due to acute blood loss 02/05/2011  . Hypokalemia 02/05/2011  . Osteoarthritis of hip 01/27/2011  . Hypertension 01/27/2011  . Sleep apnea 01/27/2011  . Stroke/cerebrovascular accident ([place]) 01/27/2011  . Cancer of prostate [facility]) 01/27/2011    Quay Burow, OTR/L 03/21/2018, 1:12 PM  [facility] [address] [place], [place], [address] Phone: [phone]   Fax:  [phone]  Name: Brian Brown MRN: [id number] Date of Birth: 1932/12/18

## 2018-03-25 ENCOUNTER — Ambulatory Visit: Payer: Medicare Other | Admitting: Physical Therapy

## 2018-03-28 ENCOUNTER — Ambulatory Visit: Payer: Medicare Other | Admitting: Physical Therapy

## 2018-03-28 ENCOUNTER — Telehealth: Payer: Self-pay | Admitting: Occupational Therapy

## 2018-03-28 NOTE — Telephone Encounter (Signed)
Prabhav Faulkenberry was contacted today regarding the temporary closing of [facility] due to Covid-19.  Therapist ensured pt did not have any current questions. Pt stated he had an appointment scheduled with [facility] Neurology on Wednesday. Informed pt that they are a separate practice and encouraged pt to call today to determine status of that appt. Pt verbalized understanding and stated he had the number.   Do not feel pt clinically would benefit an e-visit, virtual check in, or telehealth visit however encouraged pt to call with any questions.    [facility] will follow up with patients when we are able to resume care.  Forde Radon, OTR/L [facility] [address] [place], [place]  [address] Phone:  [phone] Fax:  [phone] \

## 2018-03-30 ENCOUNTER — Ambulatory Visit: Payer: BLUE CROSS/BLUE SHIELD | Admitting: Nurse Practitioner

## 2018-03-30 ENCOUNTER — Ambulatory Visit: Payer: Medicare Other | Admitting: Occupational Therapy

## 2018-04-05 ENCOUNTER — Encounter: Payer: Medicare Other | Admitting: Occupational Therapy

## 2018-04-05 ENCOUNTER — Ambulatory Visit: Payer: Medicare Other | Admitting: Physical Therapy

## 2018-04-07 ENCOUNTER — Encounter: Payer: Medicare Other | Admitting: Occupational Therapy

## 2018-04-07 ENCOUNTER — Ambulatory Visit: Payer: Medicare Other | Admitting: Physical Therapy

## 2018-04-11 ENCOUNTER — Ambulatory Visit: Payer: Medicare Other | Admitting: Physical Therapy

## 2018-04-13 ENCOUNTER — Ambulatory Visit: Payer: Medicare Other | Admitting: Physical Therapy

## 2018-04-18 ENCOUNTER — Ambulatory Visit: Payer: Medicare Other | Admitting: Physical Therapy

## 2018-04-19 DIAGNOSIS — I10 Essential (primary) hypertension: Secondary | ICD-10-CM | POA: Diagnosis not present

## 2018-04-19 DIAGNOSIS — E7800 Pure hypercholesterolemia, unspecified: Secondary | ICD-10-CM | POA: Diagnosis not present

## 2018-04-19 DIAGNOSIS — I63531 Cerebral infarction due to unspecified occlusion or stenosis of right posterior cerebral artery: Secondary | ICD-10-CM | POA: Diagnosis not present

## 2018-04-20 ENCOUNTER — Ambulatory Visit: Payer: Medicare Other | Admitting: Physical Therapy

## 2018-04-21 DIAGNOSIS — I63531 Cerebral infarction due to unspecified occlusion or stenosis of right posterior cerebral artery: Secondary | ICD-10-CM | POA: Diagnosis not present

## 2018-04-21 DIAGNOSIS — E7800 Pure hypercholesterolemia, unspecified: Secondary | ICD-10-CM | POA: Diagnosis not present

## 2018-04-21 DIAGNOSIS — I10 Essential (primary) hypertension: Secondary | ICD-10-CM | POA: Diagnosis not present

## 2018-04-25 ENCOUNTER — Ambulatory Visit: Payer: Medicare Other | Admitting: Physical Therapy

## 2018-04-27 ENCOUNTER — Ambulatory Visit: Payer: Medicare Other | Admitting: Physical Therapy

## 2018-04-28 ENCOUNTER — Telehealth: Payer: Self-pay | Admitting: Adult Health

## 2018-04-28 NOTE — Telephone Encounter (Signed)
Spoke with pt and offer a vv- Pt stated he would discuss this with his daughter and call the office back 4/23 or 4/27 to let the office kno what he decided. FYI

## 2018-05-02 ENCOUNTER — Ambulatory Visit: Payer: Medicare Other | Admitting: Physical Therapy

## 2018-05-02 NOTE — Telephone Encounter (Signed)
I called pt back about unable to do video visit for new pt. Pt states he lives alone and his daughter is in [place]. He has a phone with a camera but he is not good with it. He does not access his email regularly. Pt was r/s till June 2020. I explain we are not doing in office visits due to [place] 19. I gave new appt with check in at 145pm. PT wrote this down in his calendar. I explain if we are unable to do office visits it will be video visits. I advise pt to have a neighbor help him download the app of web ex If needed, and to help him access his email. Pt verbalized understanding.

## 2018-05-02 NOTE — Telephone Encounter (Signed)
As he is a new patient, it is highly recommended for him to participate in WebEx visit as it will be important for the initial assessment as a new patient.  If he continues to decline WebEx visit despite reassuring confidentiality with visit, he had to be rescheduled for when we are seeing patients in office if he has been stable.

## 2018-05-02 NOTE — Telephone Encounter (Signed)
Pt called in and stated he isnt comfortable with a webex visit.Marland Kitchen pt states hes comfortable with a Tele visit and he states he can do that

## 2018-05-02 NOTE — Telephone Encounter (Signed)
This is a new pt from the hospital. Do you want to r/s him out.Let me know thanks.

## 2018-05-04 ENCOUNTER — Ambulatory Visit: Payer: Self-pay | Admitting: Adult Health

## 2018-05-04 ENCOUNTER — Ambulatory Visit: Payer: Medicare Other | Admitting: Physical Therapy

## 2018-05-10 ENCOUNTER — Ambulatory Visit: Payer: Self-pay | Admitting: Adult Health

## 2018-05-15 DIAGNOSIS — E7800 Pure hypercholesterolemia, unspecified: Secondary | ICD-10-CM | POA: Diagnosis not present

## 2018-05-15 DIAGNOSIS — M1990 Unspecified osteoarthritis, unspecified site: Secondary | ICD-10-CM | POA: Diagnosis not present

## 2018-05-15 DIAGNOSIS — I10 Essential (primary) hypertension: Secondary | ICD-10-CM | POA: Diagnosis not present

## 2018-05-15 DIAGNOSIS — I6789 Other cerebrovascular disease: Secondary | ICD-10-CM | POA: Diagnosis not present

## 2018-05-15 DIAGNOSIS — I639 Cerebral infarction, unspecified: Secondary | ICD-10-CM | POA: Diagnosis not present

## 2018-05-15 DIAGNOSIS — Z8546 Personal history of malignant neoplasm of prostate: Secondary | ICD-10-CM | POA: Diagnosis not present

## 2018-05-15 DIAGNOSIS — E785 Hyperlipidemia, unspecified: Secondary | ICD-10-CM | POA: Diagnosis not present

## 2018-05-15 DIAGNOSIS — I509 Heart failure, unspecified: Secondary | ICD-10-CM | POA: Diagnosis not present

## 2018-05-15 DIAGNOSIS — I63531 Cerebral infarction due to unspecified occlusion or stenosis of right posterior cerebral artery: Secondary | ICD-10-CM | POA: Diagnosis not present

## 2018-05-16 ENCOUNTER — Encounter: Payer: Self-pay | Admitting: Occupational Therapy

## 2018-05-16 DIAGNOSIS — R41842 Visuospatial deficit: Secondary | ICD-10-CM

## 2018-05-16 NOTE — Therapy (Signed)
[facility] [address] [place], [place], [address] Phone: [phone]   Fax:  [phone]  Occupational Therapy Treatment  Patient Details  Name: Brian Brown MRN: [id number] Date of Birth: August 04, 1932 Referring Provider (OT): Dr. Seward Carol (PCP)   Encounter Date: 05/16/2018    Past Medical History:  Diagnosis Date  . Arthritis    degenerative joint disease  . Bleeding stomach ulcer 25 yrs ago  . Cancer [facility]) 2002   prostate, s/p surgery  . Hepatitis 1981   does not know what kind;denies jaundice  . Hypertension    treated by Dr Delfina Redwood, primary MD  . Sleep apnea 2005   does not wear a Cpap  . Stroke ([place]) 2003   w/ Rt sided weakness-some-rt leg  . Wears dentures    full top  . Wears glasses     Past Surgical History:  Procedure Laterality Date  . CARPAL TUNNEL RELEASE Left 03/29/2013   Procedure: LEFT CARPAL TUNNEL RELEASE;  Surgeon: Wynonia Sours, MD;  Location: [place];  Service: Orthopedics;  Laterality: Left;  ANESTHESIA: IV REGIONAL FAB  . CARPAL TUNNEL RELEASE Right 03/06/2014   Procedure: RIGHT CARPAL TUNNEL RELEASE;  Surgeon: Daryll Brod, MD;  Location: [place];  Service: Orthopedics;  Laterality: Right;  . COLONOSCOPY    . HERNIA REPAIR  2009   rt inguinal hernia  . JOINT REPLACEMENT  12/2001   rt hip ;[place]  2002  . SHOULDER ARTHROSCOPY W/ ROTATOR CUFF REPAIR  2014   lt-dr whitfield  . TOTAL HIP ARTHROPLASTY  02/03/2011   Procedure: TOTAL HIP ARTHROPLASTY;  Surgeon: Garald Balding, MD;  Location: [facility];  Service: Orthopedics;  Laterality: Left;  . TRIGGER FINGER RELEASE Left 03/29/2013   Procedure: LEFT MIDDLE/SMALL FINGER RELEASE A-1 PULLEY AND LEFT RING FINGER;  Surgeon: Wynonia Sours, MD;  Location: [place];  Service: Orthopedics;  Laterality: Left;  . TRIGGER FINGER RELEASE Right 03/06/2014   Procedure:  RIGHT MIDDLE FINGER,RIGHT RINGER FINGER;  Surgeon: Daryll Brod, MD;  Location: [facility];  Service: Orthopedics;  Laterality: Right;    There were no vitals filed for this visit.                             OT Long Term Goals - 05/16/18 1036      OT LONG TERM GOAL #1   Title  Pt will participate in 9 hole peg test to establish fine motor/coordination goal if deemed appropriate. - 04/07/2018    Status  Achieved   R= 43.41, L=1.13.66. 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     Status  Achieved      OT LONG TERM GOAL #3   Title  Pt will perform familiar warm meal prep task mod I    Status  Achieved   05/16/2018 per pt report     OT LONG TERM GOAL #4   Title  Pt will demonstrate understanding of visual compensatory strategies fro scanning with reading and mobility..     Status  Achieved      OT LONG TERM GOAL #5   Title  Pt will  verbalize understanding of AE for tying shoes and buttoning.     Status  Achieved      OT LONG TERM GOAL #6   Title  Pt will be mod I with home activities program for visual compensation and coordination (in indicated)    Status  Achieved      OT LONG TERM GOAL #7   Title  Pt will be able to read 3 sentence paragraph with strategies prn    Status  Achieved      OT LONG TERM GOAL #8   Title  Pt will be demonstrate 90% accuracy for environmental scanning activity in prep for community mobility    Status  Unable to assess            Plan - 05/16/18 1036    Clinical Impression Statement  Spoke with pt today via telephone - pt reports that he has resumed all activites at home except driving. Pt states he is very aware that he has a "Blind spot" on the left and was able to verbalize compensations he is using for reading  and cooking, as well as functional ambulation in the community. Pt states "because I am aware of it I know that I need to look left and always double check especially in the kitchen."  Pt states his distant vision is still a little blurry and pt has eye appt at the end of June. Pt feesl he does not require any further OT at this point and is in agreement to d/c from OT at this time. Pt knows if he wishes to resume he will need an new orfer from MD.     Occupational performance deficits (Please refer to evaluation for details):  ADL's;IADL's;Leisure;Social Participation    Body Structure / Function / Physical Skills  ADL;Tone;Body mechanics;UE functional use;IADL;Vision;Coordination;Sensation;Mobility;GMC;Decreased knowledge of use of DME;FMC    Comorbidities Affecting Occupational Performance:  May have comorbidities impacting occupational performance    Modification or Assistance to Complete Evaluation   Min-Moderate modification of tasks or assist with assess necessary to complete eval    OT Frequency  2x / week    OT Duration  4 weeks    OT Treatment/Interventions  Self-care/ADL training;DME and/or AE instruction;Balance training;Therapeutic activities;Therapeutic exercise;Psychosocial skills training;Cognitive remediation/compensation;Coping strategies training;Neuromuscular education;Visual/perceptual remediation/compensation;Patient/family education    Plan  d/c from OT at this time.     Consulted and Agree with Plan of Care  Patient       Patient will benefit from skilled therapeutic intervention in order to improve the following deficits and impairments:  Body Structure / Function / Physical Skills  Visit Diagnosis: Visuospatial deficit    Problem List Patient Active Problem List   Diagnosis Date Noted  . Acute CVA (cerebrovascular accident) ([place]) 02/14/2018  . TIA (transient ischemic attack) 02/13/2018  . Hepatitis C reactive 02/06/2011  . Hyponatremia 02/06/2011  . Postoperative  anemia due to acute blood loss 02/05/2011  . Hypokalemia 02/05/2011  . Osteoarthritis of hip 01/27/2011  . Hypertension 01/27/2011  . Sleep apnea 01/27/2011  . Stroke/cerebrovascular accident ([place]) 01/27/2011  . Cancer of prostate ([place]) 01/27/2011   OCCUPATIONAL THERAPY DISCHARGE SUMMARY  Visits from Start of Care: 4  Current functional level related to goals / functional outcomes: See above   Remaining deficits: L hemianopsia   Education / Equipment: HEP, compensation strategies  Plan: Patient agrees to discharge.  Patient goals were met. Patient is being discharged due to meeting the stated rehab goals.  ?????  Quay Burow, OTR/L 05/16/2018, 10:41 AM  [facility] [address] [place], [place], [address] Phone: [phone]   Fax:  [phone]  Name: Brian Brown MRN: [id number] Date of Birth: 17-Feb-1932

## 2018-06-01 ENCOUNTER — Telehealth: Payer: Self-pay

## 2018-06-01 DIAGNOSIS — Z961 Presence of intraocular lens: Secondary | ICD-10-CM | POA: Diagnosis not present

## 2018-06-01 DIAGNOSIS — H40013 Open angle with borderline findings, low risk, bilateral: Secondary | ICD-10-CM | POA: Diagnosis not present

## 2018-06-01 DIAGNOSIS — H53462 Homonymous bilateral field defects, left side: Secondary | ICD-10-CM | POA: Diagnosis not present

## 2018-06-01 NOTE — Telephone Encounter (Signed)
Unable to reach pt about visit being video pt r/s from April 2020. Number has no vm will call patient tomorrow.

## 2018-06-06 NOTE — Telephone Encounter (Signed)
Pt called and r/s for in office visit on June 25 at 0815am check in at 745am. I stated he will have to check in outside and wait till nurse calls him for the visit. Pt does not have phone with camera attach. Pt verbalized understanding.

## 2018-06-06 NOTE — Telephone Encounter (Signed)
Due to patient being at high risk in regards to COVID-19, it would be preferred to reschedule visit out at least until August/September if he is stable and has no concerns.  If he requests office visit at this time, please ensure he understands and verbalizes risks of coming to the office.  If office visit is needed at this time, please reschedule in the morning tomorrow or on a day that I am seeing other office patients.  Thank you.

## 2018-06-07 ENCOUNTER — Inpatient Hospital Stay: Payer: Self-pay | Admitting: Adult Health

## 2018-06-10 DIAGNOSIS — Z8546 Personal history of malignant neoplasm of prostate: Secondary | ICD-10-CM | POA: Diagnosis not present

## 2018-06-10 DIAGNOSIS — I509 Heart failure, unspecified: Secondary | ICD-10-CM | POA: Diagnosis not present

## 2018-06-10 DIAGNOSIS — I69351 Hemiplegia and hemiparesis following cerebral infarction affecting right dominant side: Secondary | ICD-10-CM | POA: Diagnosis not present

## 2018-06-10 DIAGNOSIS — E7800 Pure hypercholesterolemia, unspecified: Secondary | ICD-10-CM | POA: Diagnosis not present

## 2018-06-10 DIAGNOSIS — Z9181 History of falling: Secondary | ICD-10-CM | POA: Diagnosis not present

## 2018-06-10 DIAGNOSIS — I69312 Visuospatial deficit and spatial neglect following cerebral infarction: Secondary | ICD-10-CM | POA: Diagnosis not present

## 2018-06-10 DIAGNOSIS — Z87891 Personal history of nicotine dependence: Secondary | ICD-10-CM | POA: Diagnosis not present

## 2018-06-10 DIAGNOSIS — Z7902 Long term (current) use of antithrombotics/antiplatelets: Secondary | ICD-10-CM | POA: Diagnosis not present

## 2018-06-10 DIAGNOSIS — Z741 Need for assistance with personal care: Secondary | ICD-10-CM | POA: Diagnosis not present

## 2018-06-10 DIAGNOSIS — I110 Hypertensive heart disease with heart failure: Secondary | ICD-10-CM | POA: Diagnosis not present

## 2018-06-13 DIAGNOSIS — I69312 Visuospatial deficit and spatial neglect following cerebral infarction: Secondary | ICD-10-CM | POA: Diagnosis not present

## 2018-06-13 DIAGNOSIS — I110 Hypertensive heart disease with heart failure: Secondary | ICD-10-CM | POA: Diagnosis not present

## 2018-06-13 DIAGNOSIS — I509 Heart failure, unspecified: Secondary | ICD-10-CM | POA: Diagnosis not present

## 2018-06-13 DIAGNOSIS — I69351 Hemiplegia and hemiparesis following cerebral infarction affecting right dominant side: Secondary | ICD-10-CM | POA: Diagnosis not present

## 2018-06-13 DIAGNOSIS — Z7902 Long term (current) use of antithrombotics/antiplatelets: Secondary | ICD-10-CM | POA: Diagnosis not present

## 2018-06-13 DIAGNOSIS — Z9181 History of falling: Secondary | ICD-10-CM | POA: Diagnosis not present

## 2018-06-14 DIAGNOSIS — I69312 Visuospatial deficit and spatial neglect following cerebral infarction: Secondary | ICD-10-CM | POA: Diagnosis not present

## 2018-06-14 DIAGNOSIS — I509 Heart failure, unspecified: Secondary | ICD-10-CM | POA: Diagnosis not present

## 2018-06-14 DIAGNOSIS — I110 Hypertensive heart disease with heart failure: Secondary | ICD-10-CM | POA: Diagnosis not present

## 2018-06-14 DIAGNOSIS — Z7902 Long term (current) use of antithrombotics/antiplatelets: Secondary | ICD-10-CM | POA: Diagnosis not present

## 2018-06-14 DIAGNOSIS — I69351 Hemiplegia and hemiparesis following cerebral infarction affecting right dominant side: Secondary | ICD-10-CM | POA: Diagnosis not present

## 2018-06-14 DIAGNOSIS — Z9181 History of falling: Secondary | ICD-10-CM | POA: Diagnosis not present

## 2018-06-15 DIAGNOSIS — Z7902 Long term (current) use of antithrombotics/antiplatelets: Secondary | ICD-10-CM | POA: Diagnosis not present

## 2018-06-15 DIAGNOSIS — I110 Hypertensive heart disease with heart failure: Secondary | ICD-10-CM | POA: Diagnosis not present

## 2018-06-15 DIAGNOSIS — Z9181 History of falling: Secondary | ICD-10-CM | POA: Diagnosis not present

## 2018-06-15 DIAGNOSIS — I509 Heart failure, unspecified: Secondary | ICD-10-CM | POA: Diagnosis not present

## 2018-06-15 DIAGNOSIS — I69312 Visuospatial deficit and spatial neglect following cerebral infarction: Secondary | ICD-10-CM | POA: Diagnosis not present

## 2018-06-15 DIAGNOSIS — I69351 Hemiplegia and hemiparesis following cerebral infarction affecting right dominant side: Secondary | ICD-10-CM | POA: Diagnosis not present

## 2018-06-16 DIAGNOSIS — I509 Heart failure, unspecified: Secondary | ICD-10-CM | POA: Diagnosis not present

## 2018-06-16 DIAGNOSIS — I69351 Hemiplegia and hemiparesis following cerebral infarction affecting right dominant side: Secondary | ICD-10-CM | POA: Diagnosis not present

## 2018-06-16 DIAGNOSIS — Z9181 History of falling: Secondary | ICD-10-CM | POA: Diagnosis not present

## 2018-06-16 DIAGNOSIS — Z7902 Long term (current) use of antithrombotics/antiplatelets: Secondary | ICD-10-CM | POA: Diagnosis not present

## 2018-06-16 DIAGNOSIS — I110 Hypertensive heart disease with heart failure: Secondary | ICD-10-CM | POA: Diagnosis not present

## 2018-06-16 DIAGNOSIS — I69312 Visuospatial deficit and spatial neglect following cerebral infarction: Secondary | ICD-10-CM | POA: Diagnosis not present

## 2018-06-20 DIAGNOSIS — I110 Hypertensive heart disease with heart failure: Secondary | ICD-10-CM | POA: Diagnosis not present

## 2018-06-20 DIAGNOSIS — I69312 Visuospatial deficit and spatial neglect following cerebral infarction: Secondary | ICD-10-CM | POA: Diagnosis not present

## 2018-06-20 DIAGNOSIS — I69351 Hemiplegia and hemiparesis following cerebral infarction affecting right dominant side: Secondary | ICD-10-CM | POA: Diagnosis not present

## 2018-06-20 DIAGNOSIS — Z9181 History of falling: Secondary | ICD-10-CM | POA: Diagnosis not present

## 2018-06-20 DIAGNOSIS — Z7902 Long term (current) use of antithrombotics/antiplatelets: Secondary | ICD-10-CM | POA: Diagnosis not present

## 2018-06-20 DIAGNOSIS — I509 Heart failure, unspecified: Secondary | ICD-10-CM | POA: Diagnosis not present

## 2018-06-21 DIAGNOSIS — Z9181 History of falling: Secondary | ICD-10-CM | POA: Diagnosis not present

## 2018-06-21 DIAGNOSIS — I509 Heart failure, unspecified: Secondary | ICD-10-CM | POA: Diagnosis not present

## 2018-06-21 DIAGNOSIS — I69312 Visuospatial deficit and spatial neglect following cerebral infarction: Secondary | ICD-10-CM | POA: Diagnosis not present

## 2018-06-21 DIAGNOSIS — I110 Hypertensive heart disease with heart failure: Secondary | ICD-10-CM | POA: Diagnosis not present

## 2018-06-21 DIAGNOSIS — Z7902 Long term (current) use of antithrombotics/antiplatelets: Secondary | ICD-10-CM | POA: Diagnosis not present

## 2018-06-21 DIAGNOSIS — I69351 Hemiplegia and hemiparesis following cerebral infarction affecting right dominant side: Secondary | ICD-10-CM | POA: Diagnosis not present

## 2018-06-23 DIAGNOSIS — I69312 Visuospatial deficit and spatial neglect following cerebral infarction: Secondary | ICD-10-CM | POA: Diagnosis not present

## 2018-06-23 DIAGNOSIS — I509 Heart failure, unspecified: Secondary | ICD-10-CM | POA: Diagnosis not present

## 2018-06-23 DIAGNOSIS — I110 Hypertensive heart disease with heart failure: Secondary | ICD-10-CM | POA: Diagnosis not present

## 2018-06-23 DIAGNOSIS — I69351 Hemiplegia and hemiparesis following cerebral infarction affecting right dominant side: Secondary | ICD-10-CM | POA: Diagnosis not present

## 2018-06-23 DIAGNOSIS — Z9181 History of falling: Secondary | ICD-10-CM | POA: Diagnosis not present

## 2018-06-23 DIAGNOSIS — Z7902 Long term (current) use of antithrombotics/antiplatelets: Secondary | ICD-10-CM | POA: Diagnosis not present

## 2018-06-27 DIAGNOSIS — Z9181 History of falling: Secondary | ICD-10-CM | POA: Diagnosis not present

## 2018-06-27 DIAGNOSIS — Z7902 Long term (current) use of antithrombotics/antiplatelets: Secondary | ICD-10-CM | POA: Diagnosis not present

## 2018-06-27 DIAGNOSIS — I69351 Hemiplegia and hemiparesis following cerebral infarction affecting right dominant side: Secondary | ICD-10-CM | POA: Diagnosis not present

## 2018-06-27 DIAGNOSIS — I509 Heart failure, unspecified: Secondary | ICD-10-CM | POA: Diagnosis not present

## 2018-06-27 DIAGNOSIS — I69312 Visuospatial deficit and spatial neglect following cerebral infarction: Secondary | ICD-10-CM | POA: Diagnosis not present

## 2018-06-27 DIAGNOSIS — I110 Hypertensive heart disease with heart failure: Secondary | ICD-10-CM | POA: Diagnosis not present

## 2018-06-28 DIAGNOSIS — I509 Heart failure, unspecified: Secondary | ICD-10-CM | POA: Diagnosis not present

## 2018-06-28 DIAGNOSIS — Z7902 Long term (current) use of antithrombotics/antiplatelets: Secondary | ICD-10-CM | POA: Diagnosis not present

## 2018-06-28 DIAGNOSIS — I69351 Hemiplegia and hemiparesis following cerebral infarction affecting right dominant side: Secondary | ICD-10-CM | POA: Diagnosis not present

## 2018-06-28 DIAGNOSIS — I69312 Visuospatial deficit and spatial neglect following cerebral infarction: Secondary | ICD-10-CM | POA: Diagnosis not present

## 2018-06-28 DIAGNOSIS — I110 Hypertensive heart disease with heart failure: Secondary | ICD-10-CM | POA: Diagnosis not present

## 2018-06-28 DIAGNOSIS — Z9181 History of falling: Secondary | ICD-10-CM | POA: Diagnosis not present

## 2018-06-29 DIAGNOSIS — I509 Heart failure, unspecified: Secondary | ICD-10-CM | POA: Diagnosis not present

## 2018-06-29 DIAGNOSIS — I69351 Hemiplegia and hemiparesis following cerebral infarction affecting right dominant side: Secondary | ICD-10-CM | POA: Diagnosis not present

## 2018-06-29 DIAGNOSIS — I69312 Visuospatial deficit and spatial neglect following cerebral infarction: Secondary | ICD-10-CM | POA: Diagnosis not present

## 2018-06-29 DIAGNOSIS — I110 Hypertensive heart disease with heart failure: Secondary | ICD-10-CM | POA: Diagnosis not present

## 2018-06-29 DIAGNOSIS — Z9181 History of falling: Secondary | ICD-10-CM | POA: Diagnosis not present

## 2018-06-29 DIAGNOSIS — Z7902 Long term (current) use of antithrombotics/antiplatelets: Secondary | ICD-10-CM | POA: Diagnosis not present

## 2018-06-30 ENCOUNTER — Inpatient Hospital Stay: Payer: Self-pay | Admitting: Adult Health

## 2018-06-30 DIAGNOSIS — I110 Hypertensive heart disease with heart failure: Secondary | ICD-10-CM | POA: Diagnosis not present

## 2018-06-30 DIAGNOSIS — I69312 Visuospatial deficit and spatial neglect following cerebral infarction: Secondary | ICD-10-CM | POA: Diagnosis not present

## 2018-06-30 DIAGNOSIS — Z7902 Long term (current) use of antithrombotics/antiplatelets: Secondary | ICD-10-CM | POA: Diagnosis not present

## 2018-06-30 DIAGNOSIS — Z9181 History of falling: Secondary | ICD-10-CM | POA: Diagnosis not present

## 2018-06-30 DIAGNOSIS — I69351 Hemiplegia and hemiparesis following cerebral infarction affecting right dominant side: Secondary | ICD-10-CM | POA: Diagnosis not present

## 2018-06-30 DIAGNOSIS — I509 Heart failure, unspecified: Secondary | ICD-10-CM | POA: Diagnosis not present

## 2018-07-04 ENCOUNTER — Other Ambulatory Visit: Payer: Self-pay

## 2018-07-04 ENCOUNTER — Ambulatory Visit (INDEPENDENT_AMBULATORY_CARE_PROVIDER_SITE_OTHER): Payer: Medicare Other | Admitting: Adult Health

## 2018-07-04 ENCOUNTER — Encounter: Payer: Self-pay | Admitting: Adult Health

## 2018-07-04 VITALS — BP 132/72 | HR 72 | Temp 97.8°F | Ht 68.0 in | Wt 187.0 lb

## 2018-07-04 DIAGNOSIS — I63431 Cerebral infarction due to embolism of right posterior cerebral artery: Secondary | ICD-10-CM | POA: Diagnosis not present

## 2018-07-04 DIAGNOSIS — I110 Hypertensive heart disease with heart failure: Secondary | ICD-10-CM | POA: Diagnosis not present

## 2018-07-04 DIAGNOSIS — Z7902 Long term (current) use of antithrombotics/antiplatelets: Secondary | ICD-10-CM | POA: Diagnosis not present

## 2018-07-04 DIAGNOSIS — I69312 Visuospatial deficit and spatial neglect following cerebral infarction: Secondary | ICD-10-CM | POA: Diagnosis not present

## 2018-07-04 DIAGNOSIS — I69351 Hemiplegia and hemiparesis following cerebral infarction affecting right dominant side: Secondary | ICD-10-CM | POA: Diagnosis not present

## 2018-07-04 DIAGNOSIS — H53462 Homonymous bilateral field defects, left side: Secondary | ICD-10-CM

## 2018-07-04 DIAGNOSIS — E785 Hyperlipidemia, unspecified: Secondary | ICD-10-CM | POA: Diagnosis not present

## 2018-07-04 DIAGNOSIS — I10 Essential (primary) hypertension: Secondary | ICD-10-CM | POA: Diagnosis not present

## 2018-07-04 DIAGNOSIS — I509 Heart failure, unspecified: Secondary | ICD-10-CM | POA: Diagnosis not present

## 2018-07-04 DIAGNOSIS — Z9181 History of falling: Secondary | ICD-10-CM | POA: Diagnosis not present

## 2018-07-04 NOTE — Patient Instructions (Signed)
Continue clopidogrel 75 mg daily  and Crestor 10 mg daily for secondary stroke prevention  Continue to follow up with PCP regarding cholesterol and blood pressure management   Referral placed for evaluation by cardiology for transesophageal echocardiogram and possible loop recorder - further information regarding this studies will be discussed during initial evaluation with cardiology  Continue to follow with Dr. Katy Fitch as well as release to return to driving if able - will request office visits be sent to our office   Continue to monitor blood pressure at home  Maintain strict control of hypertension with blood pressure goal below 130/90, diabetes with hemoglobin A1c goal below 6.5% and cholesterol with LDL cholesterol (bad cholesterol) goal below 70 mg/dL. I also advised the patient to eat a healthy diet with plenty of whole grains, cereals, fruits and vegetables, exercise regularly and maintain ideal body weight.  Followup in the future with me in 6 months or call earlier if needed       Thank you for coming to see [place] at [facility]. I hope we have been able to provide you high quality care today.  You may receive a patient satisfaction survey over the next few weeks. We would appreciate your feedback and comments so that we may continue to improve ourselves and the health of our patients.

## 2018-07-04 NOTE — Progress Notes (Signed)
[facility] [address] [place]. [place] [address] [phone]       OFFICE FOLLOW UP NOTE  [name] Date of Birth:  Aug 10, 1932 Medical Record Number:  [id number]   Reason for Referral:  hospital stroke follow up    CHIEF COMPLAINT:  Chief Complaint  Patient presents with  . Follow-up    stroke follow up room 9 pt alone    HPI: Brian Brown was initially scheduled today for in office hospital follow-up regarding right PCA infarct embolic pattern secondary to undetermined source on 02/13/2018. History obtained from patient and chart review. Reviewed all radiology images and labs personally.  [name] is a 85 y.o. male with history of HTN and CVA presenting with visual change (L hemianopia).  Stroke work-up revealed right PCA infarct embolic pattern secondary to undetermined source.  MRA head normal.  MRA neck showed generalized dolichoectasia and mild stenosis left P2.  2D echo and lower extremity venous Doppler negative.  Recommended TEE outpatient and possible need of loop recorder placement to assess for underlying atrial fibrillation as potential cause of stroke.  LDL 62 and A1c 5.8.  Recommended DAPT for 3 weeks and Plavix alone.  Recommended continuation of Crestor 10 mg daily for HLD management.  HTN stable.  Other stroke risk factors include advanced age, former tobacco use, prior history of stroke in 2003 and history of OSA with noncompliance of CPAP.  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 He follows with Dr. Katy Fitch and plans on having follow up in 2 months. He is questioning return to driving at this time. He has been able to return to all prior activities except for driving. He lives independently but does have assistance with transportation and obtaining groceries.  Completed 3 weeks DAPT and continues on Plavix alone  without side effects of bleeding or bruising Continues on Crestor without side effects myalgias Blood pressure satisfactory 132/72 He has not underwent TEE or loop recorder as recommended at hospital discharge  No further concerns at this time   ROS:   14 system review of systems performed and negative with exception of vision difficulties  PMH:  Past Medical History:  Diagnosis Date  . Arthritis    degenerative joint disease  . Bleeding stomach ulcer 25 yrs ago  . Cancer [facility]) 2002   prostate, s/p surgery  . Hepatitis 1981   does not know what kind;denies jaundice  . Hypertension    treated by Dr Delfina Redwood, primary MD  . Sleep apnea 2005   does not wear a Cpap  . Stroke ([place]) 2003   w/ Rt sided weakness-some-rt leg  . Wears dentures    full top  . Wears glasses     PSH:  Past Surgical History:  Procedure Laterality Date  . CARPAL TUNNEL RELEASE Left 03/29/2013   Procedure: LEFT CARPAL TUNNEL RELEASE;  Surgeon: Wynonia Sours, MD;  Location: [place];  Service: Orthopedics;  Laterality: Left;  ANESTHESIA: IV REGIONAL FAB  . CARPAL TUNNEL RELEASE Right 03/06/2014   Procedure: RIGHT CARPAL TUNNEL RELEASE;  Surgeon: Daryll Brod, MD;  Location: [facility];  Service: Orthopedics;  Laterality: Right;  . COLONOSCOPY    . HERNIA REPAIR  2009   rt inguinal hernia  . JOINT REPLACEMENT  12/2001   rt hip ;[place]  2002  . SHOULDER ARTHROSCOPY W/  ROTATOR CUFF REPAIR  2014   lt-dr whitfield  . TOTAL HIP ARTHROPLASTY  02/03/2011   Procedure: TOTAL HIP ARTHROPLASTY;  Surgeon: Garald Balding, MD;  Location: [facility];  Service: Orthopedics;  Laterality: Left;  . TRIGGER FINGER RELEASE Left 03/29/2013   Procedure: LEFT MIDDLE/SMALL FINGER RELEASE A-1 PULLEY AND LEFT RING FINGER;  Surgeon: Wynonia Sours, MD;  Location: [facility];  Service: Orthopedics;  Laterality: Left;  . TRIGGER FINGER RELEASE Right 03/06/2014   Procedure:  RIGHT MIDDLE FINGER,RIGHT RINGER FINGER;  Surgeon: Daryll Brod, MD;  Location: [place];  Service: Orthopedics;  Laterality: Right;    Social History:  Social History   Socioeconomic History  . Marital status: Widowed    Spouse name: Not on file  . Number of children: Not on file  . Years of education: Not on file  . Highest education level: Not on file  Occupational History  . Not on file  Social Needs  . Financial resource strain: Not on file  . Food insecurity    Worry: Not on file    Inability: Not on file  . Transportation needs    Medical: Not on file    Non-medical: Not on file  Tobacco Use  . Smoking status: Former Smoker    Packs/day: 1.00    Years: 30.00    Pack years: 30.00    Types: Cigarettes    Quit date: 01/27/2008    Years since quitting: 10.4  . Smokeless tobacco: Never Used  Substance and Sexual Activity  . Alcohol use: No  . Drug use: No  . Sexual activity: Not on file  Lifestyle  . Physical activity    Days per week: Not on file    Minutes per session: Not on file  . Stress: Not on file  Relationships  . Social Herbalist on phone: Not on file    Gets together: Not on file    Attends religious service: Not on file    Active member of club or organization: Not on file    Attends meetings of clubs or organizations: Not on file    Relationship status: Not on file  . Intimate partner violence    Fear of current or ex partner: Not on file    Emotionally abused: Not on file    Physically abused: Not on file    Forced sexual activity: Not on file  Other Topics Concern  . Not on file  Social History Narrative  . Not on file    Family History: No family history on file.  Medications:   Current Outpatient Medications on File Prior to Visit  Medication Sig Dispense Refill  . clopidogrel (PLAVIX) 75 MG tablet Take 75 mg by mouth daily with breakfast.    . diltiazem (DILACOR XR) 240 MG 24 hr capsule Take 240 mg by mouth  daily.    . metoprolol tartrate (LOPRESSOR) 25 MG tablet Take 25 mg by mouth 2 (two) times daily.    . rosuvastatin (CRESTOR) 10 MG tablet Take 10 mg by mouth every evening.     No current facility-administered medications on file prior to visit.     Allergies:  No Known Allergies   Physical Exam  Vitals:   07/04/18 1516  BP: 132/72  Pulse: 72  Temp: 97.8 F (36.6 C)  Weight: 187 lb (84.8 kg)  Height: 5\' 8"  (1.727 m)   Body mass index is 28.43 kg/m. No exam  data present  Depression screen [facility] 2/9 07/04/2018  Decreased Interest 0  Down, Depressed, Hopeless 0  PHQ - 2 Score 0     General: well developed, well nourished, pleasant elderly AA male, seated, in no evident distress Head: head normocephalic and atraumatic.   Neck: supple with no carotid or supraclavicular bruits Cardiovascular: regular rate and rhythm, no murmurs Musculoskeletal: no deformity Skin:  no rash/petichiae Vascular:  Normal pulses all extremities   Neurologic Exam Mental Status: Awake and fully alert. Oriented to place and time. Recent and remote memory intact. Attention span, concentration and fund of knowledge appropriate. Mood and affect appropriate.  Cranial Nerves: Fundoscopic exam reveals sharp disc margins. Pupils equal, briskly reactive to light. Extraocular movements full without nystagmus. Visual fields show left homonymous hemianopia.  Hearing intact. Facial sensation intact. Face, tongue, palate moves normally and symmetrically.  Motor: Normal bulk and tone. Normal strength in all tested extremity muscles. Sensory.: intact to touch , pinprick , position and vibratory sensation.  Coordination: Rapid alternating movements normal in all extremities. Finger-to-nose and heel-to-shin performed accurately bilaterally. Gait and Station: Arises from chair without difficulty. Stance is slightly hunched.  Gait demonstrates  short shuffled steps but is able to ambulate without assistive device Reflexes:  1+ and symmetric. Toes downgoing.    NIHSS  2 Modified Rankin  2    Diagnostic Data (Labs, Imaging, Testing)   CT head age related atrophy. Scattered sinus disease. No acute stroke.   MRI  R PCA infarct medial R occipital lobe. Small vessel disease.   MRA  Normal  MRA neck generalized dolichoectasia. Mild stenosis L P2.  2D Echo   EF 60 to 65%; no PFO  LE doppler negative  TEE to look for embolic source. Please arrange with pts cardiologist or cardiologist of choice. Ok to do as an OP if medically ready for d/c  If TEE neg, recommend OP EP evaluation for loop recorder placement to look for AF as source of stroke - alert cardiology of this plan when TEE requested and they will address together.  LDL 62  HgbA1c 5.8   ASSESSMENT: Brian Brown is a 85 y.o. year old male here with right PCA infarct embolic pattern on 01/09/38 secondary to unknown source. Vascular risk factors include HTN and prior stroke.  Residual deficit of left homonymous hemianopia    PLAN:  1. Left PCA infarct: Continue clopidogrel 75 mg daily  and Crestor for secondary stroke prevention. Maintain strict control of hypertension with blood pressure goal below 130/90, diabetes with hemoglobin A1c goal below 6.5% and cholesterol with LDL cholesterol (bad cholesterol) goal below 70 mg/dL.  I also advised the patient to eat a healthy diet with plenty of whole grains, cereals, fruits and vegetables, exercise regularly with at least 30 minutes of continuous activity daily and maintain ideal body weight. 2. HTN: Advised to continue current treatment regimen. Advised to continue to monitor at home along with continued follow-up with PCP for management 3. HLD: Advised to continue current treatment regimen along with continued follow-up with PCP for future prescribing and monitoring of lipid panel 4. Left homonymous hemianopia: Ongoing follow-up with ophthalmology Dr. Katy Fitch and request office visit examination be  faxed to office as difficulty adequately assessing peripheral vision.  Deferred return to driving question to Dr. Katy Fitch 5. Referral to electrophysiology: Recommended at hospital discharge to follow-up with TEE and possible loop recorder placement outpatient for further assessment of right PCA infarct    Follow up in 6  months or call earlier if needed   Greater than 50% of time during this 45 minute visit was spent on counseling, explanation of diagnosis of left PCA infarct, reviewing risk factor management of HTN and HLD, planning of further management along with potential future management, and discussion with patient and family answering all questions.    Venancio Poisson, AGNP-BC  [facility] [address] [place], [place] [address]  Phone [phone] Fax [phone] Note: This document was prepared with digital dictation and possible smart phrase technology. Any transcriptional errors that result from this process are unintentional.

## 2018-07-05 NOTE — Progress Notes (Signed)
I agree with the above plan 

## 2018-07-07 DIAGNOSIS — Z7902 Long term (current) use of antithrombotics/antiplatelets: Secondary | ICD-10-CM | POA: Diagnosis not present

## 2018-07-07 DIAGNOSIS — I69351 Hemiplegia and hemiparesis following cerebral infarction affecting right dominant side: Secondary | ICD-10-CM | POA: Diagnosis not present

## 2018-07-07 DIAGNOSIS — I509 Heart failure, unspecified: Secondary | ICD-10-CM | POA: Diagnosis not present

## 2018-07-07 DIAGNOSIS — I110 Hypertensive heart disease with heart failure: Secondary | ICD-10-CM | POA: Diagnosis not present

## 2018-07-07 DIAGNOSIS — I69312 Visuospatial deficit and spatial neglect following cerebral infarction: Secondary | ICD-10-CM | POA: Diagnosis not present

## 2018-07-07 DIAGNOSIS — Z9181 History of falling: Secondary | ICD-10-CM | POA: Diagnosis not present

## 2018-07-10 DIAGNOSIS — Z741 Need for assistance with personal care: Secondary | ICD-10-CM | POA: Diagnosis not present

## 2018-07-10 DIAGNOSIS — Z87891 Personal history of nicotine dependence: Secondary | ICD-10-CM | POA: Diagnosis not present

## 2018-07-10 DIAGNOSIS — Z8546 Personal history of malignant neoplasm of prostate: Secondary | ICD-10-CM | POA: Diagnosis not present

## 2018-07-10 DIAGNOSIS — I509 Heart failure, unspecified: Secondary | ICD-10-CM | POA: Diagnosis not present

## 2018-07-10 DIAGNOSIS — I69312 Visuospatial deficit and spatial neglect following cerebral infarction: Secondary | ICD-10-CM | POA: Diagnosis not present

## 2018-07-10 DIAGNOSIS — E7800 Pure hypercholesterolemia, unspecified: Secondary | ICD-10-CM | POA: Diagnosis not present

## 2018-07-10 DIAGNOSIS — Z7902 Long term (current) use of antithrombotics/antiplatelets: Secondary | ICD-10-CM | POA: Diagnosis not present

## 2018-07-10 DIAGNOSIS — I110 Hypertensive heart disease with heart failure: Secondary | ICD-10-CM | POA: Diagnosis not present

## 2018-07-10 DIAGNOSIS — Z9181 History of falling: Secondary | ICD-10-CM | POA: Diagnosis not present

## 2018-07-10 DIAGNOSIS — I69351 Hemiplegia and hemiparesis following cerebral infarction affecting right dominant side: Secondary | ICD-10-CM | POA: Diagnosis not present

## 2018-07-13 DIAGNOSIS — Z7902 Long term (current) use of antithrombotics/antiplatelets: Secondary | ICD-10-CM | POA: Diagnosis not present

## 2018-07-13 DIAGNOSIS — I110 Hypertensive heart disease with heart failure: Secondary | ICD-10-CM | POA: Diagnosis not present

## 2018-07-13 DIAGNOSIS — I69312 Visuospatial deficit and spatial neglect following cerebral infarction: Secondary | ICD-10-CM | POA: Diagnosis not present

## 2018-07-13 DIAGNOSIS — I509 Heart failure, unspecified: Secondary | ICD-10-CM | POA: Diagnosis not present

## 2018-07-13 DIAGNOSIS — I69351 Hemiplegia and hemiparesis following cerebral infarction affecting right dominant side: Secondary | ICD-10-CM | POA: Diagnosis not present

## 2018-07-13 DIAGNOSIS — Z9181 History of falling: Secondary | ICD-10-CM | POA: Diagnosis not present

## 2018-07-19 DIAGNOSIS — I69312 Visuospatial deficit and spatial neglect following cerebral infarction: Secondary | ICD-10-CM | POA: Diagnosis not present

## 2018-07-19 DIAGNOSIS — Z9181 History of falling: Secondary | ICD-10-CM | POA: Diagnosis not present

## 2018-07-19 DIAGNOSIS — I69351 Hemiplegia and hemiparesis following cerebral infarction affecting right dominant side: Secondary | ICD-10-CM | POA: Diagnosis not present

## 2018-07-19 DIAGNOSIS — I110 Hypertensive heart disease with heart failure: Secondary | ICD-10-CM | POA: Diagnosis not present

## 2018-07-19 DIAGNOSIS — Z7902 Long term (current) use of antithrombotics/antiplatelets: Secondary | ICD-10-CM | POA: Diagnosis not present

## 2018-07-19 DIAGNOSIS — I509 Heart failure, unspecified: Secondary | ICD-10-CM | POA: Diagnosis not present

## 2018-07-21 DIAGNOSIS — I10 Essential (primary) hypertension: Secondary | ICD-10-CM | POA: Diagnosis not present

## 2018-07-21 DIAGNOSIS — E7800 Pure hypercholesterolemia, unspecified: Secondary | ICD-10-CM | POA: Diagnosis not present

## 2018-07-21 DIAGNOSIS — I639 Cerebral infarction, unspecified: Secondary | ICD-10-CM | POA: Diagnosis not present

## 2018-07-21 DIAGNOSIS — I509 Heart failure, unspecified: Secondary | ICD-10-CM | POA: Diagnosis not present

## 2018-07-21 DIAGNOSIS — M1990 Unspecified osteoarthritis, unspecified site: Secondary | ICD-10-CM | POA: Diagnosis not present

## 2018-07-21 DIAGNOSIS — E785 Hyperlipidemia, unspecified: Secondary | ICD-10-CM | POA: Diagnosis not present

## 2018-07-21 DIAGNOSIS — Z8546 Personal history of malignant neoplasm of prostate: Secondary | ICD-10-CM | POA: Diagnosis not present

## 2018-07-21 DIAGNOSIS — I63531 Cerebral infarction due to unspecified occlusion or stenosis of right posterior cerebral artery: Secondary | ICD-10-CM | POA: Diagnosis not present

## 2018-07-21 DIAGNOSIS — I6789 Other cerebrovascular disease: Secondary | ICD-10-CM | POA: Diagnosis not present

## 2018-08-10 ENCOUNTER — Telehealth: Payer: Self-pay | Admitting: *Deleted

## 2018-08-10 NOTE — Telephone Encounter (Signed)
Received records from [facility].

## 2018-08-15 ENCOUNTER — Other Ambulatory Visit: Payer: Self-pay | Admitting: Adult Health

## 2018-08-15 ENCOUNTER — Telehealth: Payer: Self-pay | Admitting: Adult Health

## 2018-08-15 NOTE — Telephone Encounter (Signed)
Patient continues to follow with Dr. Katy Fitch [facility]) for post stroke left homonymous hemianopsia.  Received office visit evaluation via fax.  Prior evaluation by Dr. Katy Fitch 06/01/2018 showed ongoing left homonymous hemianopsia with visual fields unchanged from prior exam.  Per report, discussion with patient regarding return to driving and it was recommended by Dr. Katy Fitch not to return due to safety concerns with visual loss.

## 2018-08-22 ENCOUNTER — Ambulatory Visit (INDEPENDENT_AMBULATORY_CARE_PROVIDER_SITE_OTHER): Payer: Medicare Other | Admitting: Cardiology

## 2018-08-22 ENCOUNTER — Other Ambulatory Visit: Payer: Self-pay

## 2018-08-22 ENCOUNTER — Encounter: Payer: Self-pay | Admitting: Cardiology

## 2018-08-22 VITALS — BP 136/88 | HR 56 | Ht 68.0 in | Wt 184.5 lb

## 2018-08-22 DIAGNOSIS — I639 Cerebral infarction, unspecified: Secondary | ICD-10-CM | POA: Diagnosis not present

## 2018-08-22 NOTE — H&P (View-Only) (Signed)
Electrophysiology Office Note   Date:  08/22/2018   ID:  Brian Brown, DOB 29-Jul-1932, MRN [id number]  PCP:  Seward Carol, MD  Cardiologist:   Primary Electrophysiologist:  Tiahna Cure Meredith Leeds, MD    No chief complaint on file.    History of Present Illness: Brian Brown is a 85 y.o. male who is being seen today for the evaluation of CVA at the request of Claris Gower, NP. Presenting today for electrophysiology evaluation.  He has a history significant for prostate cancer status post surgery, hepatitis, hypertension, sleep apnea not on CPAP, CVA.  He is being referred for evaluation of Linq monitor.  His stroke symptoms were left hemianopia.  He had a right PCA infarct with an embolic pattern.  It was recommended that he have a TEE as an outpatient followed by a loop monitor implant.  Today, he denies symptoms of palpitations, chest pain, shortness of breath, orthopnea, PND, lower extremity edema, claudication, dizziness, presyncope, syncope, bleeding, or neurologic sequela. The patient is tolerating medications without difficulties.    Past Medical History:  Diagnosis Date  . Arthritis    degenerative joint disease  . Bleeding stomach ulcer 25 yrs ago  . Cancer [facility]) 2002   prostate, s/p surgery  . Hepatitis 1981   does not know what kind;denies jaundice  . Hypertension    treated by Dr Delfina Redwood, primary MD  . Sleep apnea 2005   does not wear a Cpap  . Stroke ([place]) 2003   w/ Rt sided weakness-some-rt leg  . Wears dentures    full top  . Wears glasses    Past Surgical History:  Procedure Laterality Date  . CARPAL TUNNEL RELEASE Left 03/29/2013   Procedure: LEFT CARPAL TUNNEL RELEASE;  Surgeon: Wynonia Sours, MD;  Location: [facility];  Service: Orthopedics;  Laterality: Left;  ANESTHESIA: IV REGIONAL FAB  . CARPAL TUNNEL RELEASE Right 03/06/2014   Procedure: RIGHT CARPAL TUNNEL RELEASE;  Surgeon: Daryll Brod, MD;  Location: [facility];  Service: Orthopedics;  Laterality: Right;  . COLONOSCOPY    . HERNIA REPAIR  2009   rt inguinal hernia  . JOINT REPLACEMENT  12/2001   rt hip ;[place]  2002  . SHOULDER ARTHROSCOPY W/ ROTATOR CUFF REPAIR  2014   lt-dr whitfield  . TOTAL HIP ARTHROPLASTY  02/03/2011   Procedure: TOTAL HIP ARTHROPLASTY;  Surgeon: Garald Balding, MD;  Location: [facility];  Service: Orthopedics;  Laterality: Left;  . TRIGGER FINGER RELEASE Left 03/29/2013   Procedure: LEFT MIDDLE/SMALL FINGER RELEASE A-1 PULLEY AND LEFT RING FINGER;  Surgeon: Wynonia Sours, MD;  Location: [facility];  Service: Orthopedics;  Laterality: Left;  . TRIGGER FINGER RELEASE Right 03/06/2014   Procedure: RIGHT MIDDLE FINGER,RIGHT RINGER FINGER;  Surgeon: Daryll Brod, MD;  Location: [facility];  Service: Orthopedics;  Laterality: Right;     Current Outpatient Medications  Medication Sig Dispense Refill  . clopidogrel (PLAVIX) 75 MG tablet Take 75 mg by mouth daily with breakfast.    . diltiazem (DILACOR XR) 240 MG 24 hr capsule Take 240 mg by mouth daily.    . metoprolol tartrate (LOPRESSOR) 25 MG tablet Take 25 mg by mouth 2 (two) times daily.    . rosuvastatin (CRESTOR) 10 MG tablet Take 10 mg by mouth every evening.     No current facility-administered medications for this visit.     Allergies:  Patient has no known allergies.   Social History:  The patient  reports that he quit smoking about 10 years ago. His smoking use included cigarettes. He has a 30.00 pack-year smoking history. He has never used smokeless tobacco. He reports that he does not drink alcohol or use drugs.   Family History:  The patient's family history includes Heart disease in his father and mother.    ROS:  Please see the history of present illness.   Otherwise, review of systems is positive for none.   All other systems are reviewed and negative.    PHYSICAL EXAM: VS:  BP 136/88   Pulse (!)  56   Ht 5\' 8"  (1.727 m)   Wt 184 lb 8 oz (83.7 kg)   SpO2 98%   BMI 28.05 kg/m  , BMI Body mass index is 28.05 kg/m. GEN: Well nourished, well developed, in no acute distress  HEENT: normal  Neck: no JVD, carotid bruits, or masses Cardiac: RRR; no murmurs, rubs, or gallops,no edema  Respiratory:  clear to auscultation bilaterally, normal work of breathing GI: soft, nontender, nondistended, + BS MS: no deformity or atrophy  Skin: warm and dry Neuro:  Strength and sensation are intact Psych: euthymic mood, full affect  EKG:  EKG is ordered today. Personal review of the ekg ordered shows SR, rate 56  Recent Labs: 02/13/2018: ALT 15; BUN 12; Creatinine, Ser 1.28; Hemoglobin 12.7; Platelets 201; Potassium 3.4; Sodium 140    Lipid Panel     Component Value Date/Time   CHOL 117 02/14/2018 0303   TRIG 48 02/14/2018 0303   HDL 45 02/14/2018 0303   CHOLHDL 2.6 02/14/2018 0303   VLDL 10 02/14/2018 0303   LDLCALC 62 02/14/2018 0303     Wt Readings from Last 3 Encounters:  08/22/18 184 lb 8 oz (83.7 kg)  07/04/18 187 lb (84.8 kg)  02/14/18 204 lb 9.4 oz (92.8 kg)      Other studies Reviewed: Additional studies/ records that were reviewed today include: TTE 02/14/2018 Review of the above records today demonstrates:   1. The left ventricle has normal systolic function of 77-82%. The cavity size was normal. There is mildly increased left ventricular wall thickness. Left ventricular diastology could not be evaluated due to indeterminent diastolic function.  2. The right ventricle has normal systolic function. The cavity was normal. There is no increase in right ventricular wall thickness.  3. The mitral valve is normal in structure. There is mild mitral annular calcification present.  4. The tricuspid valve is normal in structure.  5. The aortic valve is tricuspid There is mild thickening and moderate calcification of the aortic valve.  6. The pulmonic valve was thickened. Pulmonic  valve regurgitation is mild by color flow Doppler.   ASSESSMENT AND PLAN:  1.  Cryptogenic stroke: At this point, no cause has been determined.  He is currently on Plavix, Crestor.  As no cause has been discovered, Cynthia Stainback plan for TEE followed by Linq monitoring.  Risks and benefits were discussed.  Risks include bleeding infection with the loop monitor.  Risks of TEE include esophageal bleeding, damage to teeth and gums.  He would like to discuss this with his family.  2.  Hypertension: Plan per neurology.  3.  Hyperlipidemia: Plan per neurology and primary care.  Continue Crestor.    Current medicines are reviewed at length with the patient today.   The patient does not have concerns regarding his medicines.  The following changes  were made today:  none  Labs/ tests ordered today include:  Orders Placed This Encounter  Procedures  . EKG 12-Lead     Disposition:   FU with Jaileigh Weimer as needed  Signed, Litzy Dicker Meredith Leeds, MD  08/22/2018 12:18 PM     [facility] [address] [place] [address] [phone] (office) [phone] (fax)

## 2018-08-22 NOTE — Patient Instructions (Signed)
Medication Instructions:  Your physician recommends that you continue on your current medications as directed. Please refer to the Current Medication list given to you today.  * If you need a refill on your cardiac medications before your next appointment, please call your pharmacy.   Labwork: None ordered  Testing/Procedures: Your physician has requested that you have a TEE. During a TEE, sound waves are used to create images of your heart. It provides your doctor with information about the size and shape of your heart and how well your heart's chambers and valves are working. In this test, a transducer is attached to the end of a flexible tube that's guided down your throat and into your esophagus (the tube leading from you mouth to your stomach) to get a more detailed image of your heart. You are not awake for the procedure. Please see the instruction sheet given to you today. For further information please visit [email or web address].  The nurse will call you in next week/two to decide if you would like to ` proceed   Your physician has recommended that you have a loop recorder implanted to monitor your heart for abnormal rhythms.  The nurse will call you in next week/two to decide if you would like to ` proceed   Follow-Up: To be determined  Thank you for choosing [facility]!!   Trinidad Curet, RN [phone]  Any Other Special Instructions Will Be Listed Below (If Applicable).   Transesophageal Echocardiogram  Transesophageal echocardiogram (TEE) is a test that uses sound waves (echocardiogram) to produce very clear, detailed images of the heart. TEE is done by passing a flexible tube down the esophagus. The heart is located in front of the esophagus. TEE may be done:  To check how well your heart valves are working.  To check for any abnormal growth or tumor  To look for blood clots  To check for infection of the lining of the heart (endocarditis).  To evaluate the  dividing wall (septum) of the heart and check for a hole that did not close after birth (patent foramen ovale or atrial septal defect).  To help diagnose a tear in the wall of the blood vessels (aortic dissection).  To look at the heart shape, size, and function. Any changes can be associated with certain conditions, including heart failure, aneurysm, and coronary heart disease, CAD.  During cardiac valve surgery. This allows the surgeon to assess the valve repair before closing the chest.  During a variety of other cardiac procedures to guide positioning of catheters.  To monitor your heart's response to IV fluids or medicine. TEE is usually not a painful procedure. You may feel the probe press against the back of the throat. The probe does not enter the trachea and does not affect your breathing. Tell a health care provider about:  Any allergies you have.  All medicines you are taking, including vitamins, herbs, eye drops, creams, and over-the-counter medicines.  Any problems you or family members have had with anesthetic medicines.  Any blood disorders you have.  Any surgeries you have had.  Any medical conditions you have.  Any swallowing difficulties.  Whether you have or have had a blockage of the esophagus (esophageal obstruction).  Whether you are pregnant or may be pregnant. What are the risks? Generally, this is a safe procedure. However, problems may occur, including:  Damage to other structures or organs.  A tear of the esophagus (esophageal rupture).  Irregular heart beat (arrhythmia).  Hoarse voice  or difficulty swallowing.  Bleeding (hemorrhage). What happens before the procedure? Staying hydrated Follow instructions from your health care provider about hydration, which may include:  Up to 3 hours before the procedure - you may continue to drink clear liquids, such as water, clear fruit juice, black coffee, and plain tea. Eating and drinking Follow  instructions from your health care provider about eating and drinking, which may include:  8 hours before the procedure - stop eating heavy meals or foods such as meat, fried foods, or fatty foods.  6 hours before the procedure - stop eating light meals or foods, such as toast or cereal.  6 hours before the procedure - stop drinking milk or drinks that contain milk.  3 hours before the procedure - stop drinking clear liquids. General instructions  You will need to remove any dentures or dental retainers.  Plan to have someone take you home from the hospital or clinic.  Plan to have a responsible adult care for you for at least 24 hours after you leave the hospital or clinic. This is important.  Ask your health care provider about: ? Changing or stopping your normal medicines. This is important if you take diabetes medicines or blood thinners. ? Taking over-the-counter medicines, vitamins, herbs, and supplements. ? Taking medicines such as aspirin and ibuprofen. These medicines can thin your blood. Do not take these medicines unless your health care provider tells you to take them. What happens during the procedure?  To reduce your risk of infection: ? Your health care team will wash or sanitize their hands. ? Hair may be removed from the surgical area. ? Your skin will be washed with soap.  An IV will be inserted into one of your veins.  You will be given one or both of the following: ? A medicine to help you relax (sedative). ? A medicine to be sprayed or gargled in order to numb the back of your throat (local anesthetic).  Your blood pressure, heart rate, and breathing (vital signs) will be monitored during the procedure.  You may be asked to lay on your left side.  A bite block will be placed in your mouth to keep you from biting the tube during the procedure.  The tip of the TEE probe will be placed into the back of your mouth. You will be asked to swallow. This helps to  pass the tip of the probe into the esophagus.  Once the tip of the probe is in the correct area, your health care provider will take pictures of the heart.  The probe and bite block will be removed when the procedure is done. The procedure may vary among health care providers and hospitals What happens after the procedure?  Your blood pressure, heart rate, breathing rate, and blood oxygen level will be monitored until the medicines you were given have worn off.  When you first wake up, your throat may feel slightly sore and will probably still feel numb. This will improve slowly over time. You will not be allowed to eat or drink until the numbness has gone away.  Do not drive for 24 hours if you received a sedative. Summary  Transesophageal echocardiogram (TEE) is a test that uses sound waves (echocardiogram) to produce very clear, detailed images of the heart.  TEE is done by passing a flexible tube down the esophagus.  Generally, this is a safe procedure. However, problems may occur, including damage to other structures or organs, bleeding, irregular heart  beat, and a hoarse voice or trouble swallowing.  Tell your health care provider about any medicines and medical conditions you may have, as some conditions may increase your risk of complications. This information is not intended to replace advice given to you by your health care provider. Make sure you discuss any questions you have with your health care provider. Document Released: 03/14/2002 Document Revised: 06/02/2016 Document Reviewed: 03/20/2016 Elsevier Patient Education  [place] Placement  An implantable loop recorder is a small electronic device that is placed under the skin of your chest. It is about the size of an AA ("double A") battery. The device records the electrical activity of your heart over a long period of time. Your health care provider can download these recordings to  monitor your heart. You may need an implantable loop recorder if you have periods of abnormal heart activity (arrhythmias) or unexplained fainting (syncope). The recorder can be left in place for 1 year or longer. Tell a health care provider about:  Any allergies you have.  All medicines you are taking, including vitamins, herbs, eye drops, creams, and over-the-counter medicines.  Any problems you or family members have had with anesthetic medicines.  Any blood disorders you have.  Any surgeries you have had.  Any medical conditions you have.  Whether you are pregnant or may be pregnant. What are the risks? Generally, this is a safe procedure. However, problems may occur, including:  Infection.  Bleeding.  Allergic reactions to anesthetic medicines.  Damage to nerves or blood vessels.  Failure of the device to work. This could require another surgery to replace it. What happens before the procedure?   You may have a physical exam, blood tests, and imaging tests of your heart, such as a chest X-ray.  Follow instructions from your health care provider about eating or drinking restrictions.  Ask your health care provider about: ? Changing or stopping your regular medicines. This is especially important if you are taking diabetes medicines or blood thinners. ? Taking medicines such as aspirin and ibuprofen. These medicines can thin your blood. Do not take these medicines unless your health care provider tells you to take them. ? Taking over-the-counter medicines, vitamins, herbs, and supplements.  Ask your health care provider how your surgical site will be marked or identified.  Ask your health care provider what steps will be taken to help prevent infection. These may include: ? Removing hair at the surgery site. ? Washing skin with a germ-killing soap.  Plan to have someone take you home from the hospital or clinic.  Plan to have a responsible adult care for you for at  least 24 hours after you leave the hospital or clinic. This is important.  Do not use any products that contain nicotine or tobacco, such as cigarettes and e-cigarettes. If you need help quitting, ask your health care provider. What happens during the procedure?  An IV will be inserted into one of your veins.  You may be given one or more of the following: ? A medicine to help you relax (sedative). ? A medicine to numb the area (local anesthetic).  A small incision will be made on the left side of your upper chest.  A pocket will be created under your skin.  The device will be placed in the pocket.  The incision will be closed with stitches (sutures) or adhesive strips.  A bandage (dressing) will be placed over the incision.  The procedure may vary among health care providers and hospitals. What happens after the procedure?  Your blood pressure, heart rate, breathing rate, and blood oxygen level will be monitored until you leave the hospital or clinic.  You may be able to go home on the day of your surgery. Before you go home: ? Your health care provider will program your recorder. ? You will learn how to trigger your device with a handheld activator. ? You will learn how to send recordings to your health care provider. ? You will get an ID card for your device, and you will be told when to use it.  Do not drive for 24 hours if you were given a sedative during your procedure. Summary  An implantable loop recorder is a small electronic device that is placed under the skin of your chest to monitor your heart over a long period of time.  The recorder can be left in place for 1 year or longer.  Plan to have someone take you home from the hospital or clinic. This information is not intended to replace advice given to you by your health care provider. Make sure you discuss any questions you have with your health care provider. Document Released: 12/03/2014 Document Revised:  02/25/2017 Document Reviewed: 02/06/2017 Elsevier Patient Education  2020 [employer].

## 2018-08-22 NOTE — Progress Notes (Signed)
Electrophysiology Office Note   Date:  08/22/2018   ID:  Brian Brown, DOB 1932/01/29, MRN [id number]  PCP:  Brian Carol, MD  Cardiologist:   Primary Electrophysiologist:  Brian Macari Meredith Leeds, MD    No chief complaint on file.    History of Present Illness: Brian Brown is a 85 y.o. male who is being seen today for the evaluation of CVA at the request of Brian Gower, NP. Presenting today for electrophysiology evaluation.  He has a history significant for prostate cancer status post surgery, hepatitis, hypertension, sleep apnea not on CPAP, CVA.  He is being referred for evaluation of Linq monitor.  His stroke symptoms were left hemianopia.  He had a right PCA infarct with an embolic pattern.  It was recommended that he have a TEE as an outpatient followed by a loop monitor implant.  Today, he denies symptoms of palpitations, chest pain, shortness of breath, orthopnea, PND, lower extremity edema, claudication, dizziness, presyncope, syncope, bleeding, or neurologic sequela. The patient is tolerating medications without difficulties.    Past Medical History:  Diagnosis Date  . Arthritis    degenerative joint disease  . Bleeding stomach ulcer 25 yrs ago  . Cancer [facility]) 2002   prostate, s/p surgery  . Hepatitis 1981   does not know what kind;denies jaundice  . Hypertension    treated by Dr Brian Brown, primary MD  . Sleep apnea 2005   does not wear a Cpap  . Stroke ([place]) 2003   w/ Rt sided weakness-some-rt leg  . Wears dentures    full top  . Wears glasses    Past Surgical History:  Procedure Laterality Date  . CARPAL TUNNEL RELEASE Left 03/29/2013   Procedure: LEFT CARPAL TUNNEL RELEASE;  Surgeon: Wynonia Sours, MD;  Location: [place];  Service: Orthopedics;  Laterality: Left;  ANESTHESIA: IV REGIONAL FAB  . CARPAL TUNNEL RELEASE Right 03/06/2014   Procedure: RIGHT CARPAL TUNNEL RELEASE;  Surgeon: Daryll Brod, MD;  Location: [place];  Service: Orthopedics;  Laterality: Right;  . COLONOSCOPY    . HERNIA REPAIR  2009   rt inguinal hernia  . JOINT REPLACEMENT  12/2001   rt hip ;[place]  2002  . SHOULDER ARTHROSCOPY W/ ROTATOR CUFF REPAIR  2014   lt-dr whitfield  . TOTAL HIP ARTHROPLASTY  02/03/2011   Procedure: TOTAL HIP ARTHROPLASTY;  Surgeon: Garald Balding, MD;  Location: [place];  Service: Orthopedics;  Laterality: Left;  . TRIGGER FINGER RELEASE Left 03/29/2013   Procedure: LEFT MIDDLE/SMALL FINGER RELEASE A-1 PULLEY AND LEFT RING FINGER;  Surgeon: Wynonia Sours, MD;  Location: [place];  Service: Orthopedics;  Laterality: Left;  . TRIGGER FINGER RELEASE Right 03/06/2014   Procedure: RIGHT MIDDLE FINGER,RIGHT RINGER FINGER;  Surgeon: Daryll Brod, MD;  Location: [place];  Service: Orthopedics;  Laterality: Right;     Current Outpatient Medications  Medication Sig Dispense Refill  . clopidogrel (PLAVIX) 75 MG tablet Take 75 mg by mouth daily with breakfast.    . diltiazem (DILACOR XR) 240 MG 24 hr capsule Take 240 mg by mouth daily.    . metoprolol tartrate (LOPRESSOR) 25 MG tablet Take 25 mg by mouth 2 (two) times daily.    . rosuvastatin (CRESTOR) 10 MG tablet Take 10 mg by mouth every evening.     No current facility-administered medications for this visit.     Allergies:  Patient has no known allergies.   Social History:  The patient  reports that he quit smoking about 10 years ago. His smoking use included cigarettes. He has a 30.00 pack-year smoking history. He has never used smokeless tobacco. He reports that he does not drink alcohol or use drugs.   Family History:  The patient's family history includes Heart disease in his father and mother.    ROS:  Please see the history of present illness.   Otherwise, review of systems is positive for none.   All other systems are reviewed and negative.    PHYSICAL EXAM: VS:  BP 136/88   Pulse (!)  56   Ht 5\' 8"  (1.727 m)   Wt 184 lb 8 oz (83.7 kg)   SpO2 98%   BMI 28.05 kg/m  , BMI Body mass index is 28.05 kg/m. GEN: Well nourished, well developed, in no acute distress  HEENT: normal  Neck: no JVD, carotid bruits, or masses Cardiac: RRR; no murmurs, rubs, or gallops,no edema  Respiratory:  clear to auscultation bilaterally, normal work of breathing GI: soft, nontender, nondistended, + BS MS: no deformity or atrophy  Skin: warm and dry Neuro:  Strength and sensation are intact Psych: euthymic mood, full affect  EKG:  EKG is ordered today. Personal review of the ekg ordered shows SR, rate 56  Recent Labs: 02/13/2018: ALT 15; BUN 12; Creatinine, Ser 1.28; Hemoglobin 12.7; Platelets 201; Potassium 3.4; Sodium 140    Lipid Panel     Component Value Date/Time   CHOL 117 02/14/2018 0303   TRIG 48 02/14/2018 0303   HDL 45 02/14/2018 0303   CHOLHDL 2.6 02/14/2018 0303   VLDL 10 02/14/2018 0303   LDLCALC 62 02/14/2018 0303     Wt Readings from Last 3 Encounters:  08/22/18 184 lb 8 oz (83.7 kg)  07/04/18 187 lb (84.8 kg)  02/14/18 204 lb 9.4 oz (92.8 kg)      Other studies Reviewed: Additional studies/ records that were reviewed today include: TTE 02/14/2018 Review of the above records today demonstrates:   1. The left ventricle has normal systolic function of 94-85%. The cavity size was normal. There is mildly increased left ventricular wall thickness. Left ventricular diastology could not be evaluated due to indeterminent diastolic function.  2. The right ventricle has normal systolic function. The cavity was normal. There is no increase in right ventricular wall thickness.  3. The mitral valve is normal in structure. There is mild mitral annular calcification present.  4. The tricuspid valve is normal in structure.  5. The aortic valve is tricuspid There is mild thickening and moderate calcification of the aortic valve.  6. The pulmonic valve was thickened. Pulmonic  valve regurgitation is mild by color flow Doppler.   ASSESSMENT AND PLAN:  1.  Cryptogenic stroke: At this point, no cause has been determined.  He is currently on Plavix, Crestor.  As no cause has been discovered, Shantara Goosby plan for TEE followed by Linq monitoring.  Risks and benefits were discussed.  Risks include bleeding infection with the loop monitor.  Risks of TEE include esophageal bleeding, damage to teeth and gums.  He would like to discuss this with his family.  2.  Hypertension: Plan per neurology.  3.  Hyperlipidemia: Plan per neurology and primary care.  Continue Crestor.    Current medicines are reviewed at length with the patient today.   The patient does not have concerns regarding his medicines.  The following changes  were made today:  none  Labs/ tests ordered today include:  Orders Placed This Encounter  Procedures  . EKG 12-Lead     Disposition:   FU with Caterine Mcmeans as needed  Signed, Nathanuel Cabreja Meredith Leeds, MD  08/22/2018 12:18 PM     [place] [address] [place] [address] [phone] (office) [phone] (fax)

## 2018-08-23 ENCOUNTER — Telehealth: Payer: Self-pay | Admitting: Cardiology

## 2018-08-23 NOTE — Telephone Encounter (Signed)
  Daughter is calling in regards to scheduling a procedure for her dad

## 2018-08-25 NOTE — Telephone Encounter (Signed)
Follow Up:    Daughter is calling to find out about pt's procedure please.

## 2018-08-25 NOTE — Telephone Encounter (Signed)
Apologized to dtr for not getting back with her sooner.  Informed that this call only came to me today so I was unaware she called 2 days ago.  dtr aware that I will discuss TEE/ILR date options with Dr. Curt Bears next week and call her to arrange. She appreciates my call today and agreeable to plan.

## 2018-08-25 NOTE — Telephone Encounter (Signed)
Follow Up:     Daughter calling to schedule procedure.

## 2018-08-25 NOTE — Telephone Encounter (Signed)
Spoke with Brian Brown and his daughter, [name]. Pts daughter would like to be called to schedule [place] TEE. Will route to [facility] for follow up.

## 2018-08-31 NOTE — Telephone Encounter (Signed)
Follow up   Patient's daughter is following up to see when the TEE will be scheduled. Please call.

## 2018-09-01 NOTE — Telephone Encounter (Signed)
Would like to arrange for 9/11 if possible.  Aware I will look into this and call her back by tomorrow.  Dtr agreeable to pl;an.

## 2018-09-05 DIAGNOSIS — I6789 Other cerebrovascular disease: Secondary | ICD-10-CM | POA: Diagnosis not present

## 2018-09-05 DIAGNOSIS — E785 Hyperlipidemia, unspecified: Secondary | ICD-10-CM | POA: Diagnosis not present

## 2018-09-05 DIAGNOSIS — I10 Essential (primary) hypertension: Secondary | ICD-10-CM | POA: Diagnosis not present

## 2018-09-05 DIAGNOSIS — I509 Heart failure, unspecified: Secondary | ICD-10-CM | POA: Diagnosis not present

## 2018-09-05 DIAGNOSIS — I639 Cerebral infarction, unspecified: Secondary | ICD-10-CM | POA: Diagnosis not present

## 2018-09-05 DIAGNOSIS — I63531 Cerebral infarction due to unspecified occlusion or stenosis of right posterior cerebral artery: Secondary | ICD-10-CM | POA: Diagnosis not present

## 2018-09-05 DIAGNOSIS — M1990 Unspecified osteoarthritis, unspecified site: Secondary | ICD-10-CM | POA: Diagnosis not present

## 2018-09-05 DIAGNOSIS — Z8546 Personal history of malignant neoplasm of prostate: Secondary | ICD-10-CM | POA: Diagnosis not present

## 2018-09-05 DIAGNOSIS — E7800 Pure hypercholesterolemia, unspecified: Secondary | ICD-10-CM | POA: Diagnosis not present

## 2018-09-06 ENCOUNTER — Telehealth: Payer: Self-pay | Admitting: Cardiology

## 2018-09-06 ENCOUNTER — Encounter: Payer: Self-pay | Admitting: *Deleted

## 2018-09-06 NOTE — Telephone Encounter (Signed)
New message   Patient's daughter is wanting to get instructions for the procedure that is scheduled for 09/16/2018. Please call.

## 2018-09-06 NOTE — Telephone Encounter (Signed)
Spoke to dtr. Reviewed COVID & procedure instructions. Letter of instructions faxed to dtr per request. Post procedure f/u scheduled for 9/24. dtr agreeable to plan and appreciates the call

## 2018-09-07 DIAGNOSIS — I63531 Cerebral infarction due to unspecified occlusion or stenosis of right posterior cerebral artery: Secondary | ICD-10-CM | POA: Diagnosis not present

## 2018-09-07 DIAGNOSIS — I10 Essential (primary) hypertension: Secondary | ICD-10-CM | POA: Diagnosis not present

## 2018-09-07 DIAGNOSIS — M1990 Unspecified osteoarthritis, unspecified site: Secondary | ICD-10-CM | POA: Diagnosis not present

## 2018-09-07 DIAGNOSIS — I6789 Other cerebrovascular disease: Secondary | ICD-10-CM | POA: Diagnosis not present

## 2018-09-07 DIAGNOSIS — Z8546 Personal history of malignant neoplasm of prostate: Secondary | ICD-10-CM | POA: Diagnosis not present

## 2018-09-07 DIAGNOSIS — E785 Hyperlipidemia, unspecified: Secondary | ICD-10-CM | POA: Diagnosis not present

## 2018-09-07 DIAGNOSIS — E7800 Pure hypercholesterolemia, unspecified: Secondary | ICD-10-CM | POA: Diagnosis not present

## 2018-09-07 DIAGNOSIS — I639 Cerebral infarction, unspecified: Secondary | ICD-10-CM | POA: Diagnosis not present

## 2018-09-07 DIAGNOSIS — I509 Heart failure, unspecified: Secondary | ICD-10-CM | POA: Diagnosis not present

## 2018-09-13 ENCOUNTER — Other Ambulatory Visit (HOSPITAL_COMMUNITY)
Admission: RE | Admit: 2018-09-13 | Discharge: 2018-09-13 | Disposition: A | Discharge status: Home / Self Care | Payer: Medicare Other | Source: Ambulatory Visit | Attending: Cardiology | Admitting: Cardiology

## 2018-09-13 DIAGNOSIS — Z01812 Encounter for preprocedural laboratory examination: Secondary | ICD-10-CM | POA: Insufficient documentation

## 2018-09-13 DIAGNOSIS — Z20828 Contact with and (suspected) exposure to other viral communicable diseases: Secondary | ICD-10-CM | POA: Insufficient documentation

## 2018-09-14 LAB — NOVEL CORONAVIRUS, NAA (HOSP ORDER, SEND-OUT TO REF LAB; TAT 18-24 HRS): SARS-CoV-2, NAA: NOT DETECTED

## 2018-09-16 ENCOUNTER — Ambulatory Visit (HOSPITAL_COMMUNITY)
Admission: RE | Admit: 2018-09-16 | Discharge: 2018-09-16 | Disposition: A | Discharge status: Home / Self Care | Payer: Medicare Other | Attending: Cardiology | Admitting: Cardiology

## 2018-09-16 ENCOUNTER — Ambulatory Visit (HOSPITAL_BASED_OUTPATIENT_CLINIC_OR_DEPARTMENT_OTHER)
Admission: RE | Admit: 2018-09-16 | Discharge: 2018-09-16 | Disposition: A | Discharge status: Home / Self Care | Payer: Medicare Other | Source: Ambulatory Visit | Attending: Cardiology | Admitting: Cardiology

## 2018-09-16 ENCOUNTER — Encounter (HOSPITAL_COMMUNITY)
Admission: RE | Disposition: A | Discharge status: Home / Self Care | Payer: Self-pay | Source: Home / Self Care | Attending: Cardiology

## 2018-09-16 ENCOUNTER — Encounter (HOSPITAL_COMMUNITY): Payer: Self-pay | Admitting: Emergency Medicine

## 2018-09-16 ENCOUNTER — Other Ambulatory Visit: Payer: Self-pay

## 2018-09-16 DIAGNOSIS — I340 Nonrheumatic mitral (valve) insufficiency: Secondary | ICD-10-CM | POA: Diagnosis not present

## 2018-09-16 DIAGNOSIS — E785 Hyperlipidemia, unspecified: Secondary | ICD-10-CM | POA: Insufficient documentation

## 2018-09-16 DIAGNOSIS — Z7902 Long term (current) use of antithrombotics/antiplatelets: Secondary | ICD-10-CM | POA: Insufficient documentation

## 2018-09-16 DIAGNOSIS — Z8546 Personal history of malignant neoplasm of prostate: Secondary | ICD-10-CM | POA: Diagnosis not present

## 2018-09-16 DIAGNOSIS — I10 Essential (primary) hypertension: Secondary | ICD-10-CM | POA: Insufficient documentation

## 2018-09-16 DIAGNOSIS — G4730 Sleep apnea, unspecified: Secondary | ICD-10-CM | POA: Insufficient documentation

## 2018-09-16 DIAGNOSIS — I639 Cerebral infarction, unspecified: Secondary | ICD-10-CM | POA: Insufficient documentation

## 2018-09-16 DIAGNOSIS — Z87891 Personal history of nicotine dependence: Secondary | ICD-10-CM | POA: Diagnosis not present

## 2018-09-16 DIAGNOSIS — I6389 Other cerebral infarction: Secondary | ICD-10-CM

## 2018-09-16 DIAGNOSIS — Z79899 Other long term (current) drug therapy: Secondary | ICD-10-CM | POA: Insufficient documentation

## 2018-09-16 HISTORY — PX: TEE WITHOUT CARDIOVERSION: SHX5443

## 2018-09-16 HISTORY — PX: BUBBLE STUDY: SHX6837

## 2018-09-16 HISTORY — PX: LOOP RECORDER INSERTION: EP1214

## 2018-09-16 SURGERY — ECHOCARDIOGRAM, TRANSESOPHAGEAL
Anesthesia: Moderate Sedation

## 2018-09-16 SURGERY — LOOP RECORDER INSERTION

## 2018-09-16 MED ORDER — FENTANYL CITRATE (PF) 100 MCG/2ML IJ SOLN
INTRAMUSCULAR | Status: AC
  Filled 2018-09-16: qty 2

## 2018-09-16 MED ORDER — SODIUM CHLORIDE 0.9 % IV SOLN
INTRAVENOUS | Status: DC
  Administered 2018-09-16: 11:00:00 via INTRAVENOUS

## 2018-09-16 MED ORDER — DIPHENHYDRAMINE HCL 50 MG/ML IJ SOLN
INTRAMUSCULAR | Status: AC
  Filled 2018-09-16: qty 1

## 2018-09-16 MED ORDER — MIDAZOLAM HCL (PF) 5 MG/ML IJ SOLN
INTRAMUSCULAR | Status: AC
  Filled 2018-09-16: qty 2

## 2018-09-16 MED ORDER — MIDAZOLAM HCL (PF) 10 MG/2ML IJ SOLN
INTRAMUSCULAR | Status: DC | PRN
  Administered 2018-09-16 (×3): 2 mg via INTRAVENOUS

## 2018-09-16 MED ORDER — LIDOCAINE HCL (PF) 1 % IJ SOLN: INTRAMUSCULAR | Status: DC | PRN

## 2018-09-16 MED ORDER — FENTANYL CITRATE (PF) 100 MCG/2ML IJ SOLN
INTRAMUSCULAR | Status: DC | PRN
  Administered 2018-09-16 (×3): 25 ug via INTRAVENOUS

## 2018-09-16 MED ORDER — LIDOCAINE-EPINEPHRINE 1 %-1:100000 IJ SOLN
INTRAMUSCULAR | Status: AC
  Filled 2018-09-16: qty 1

## 2018-09-16 MED ORDER — LIDOCAINE-EPINEPHRINE 1 %-1:100000 IJ SOLN
INTRAMUSCULAR | Status: DC | PRN
  Administered 2018-09-16: 20 mL

## 2018-09-16 MED ORDER — BUTAMBEN-TETRACAINE-BENZOCAINE 2-2-14 % EX AERO
INHALATION_SPRAY | CUTANEOUS | Status: DC | PRN
  Administered 2018-09-16: 2 via TOPICAL

## 2018-09-16 SURGICAL SUPPLY — 2 items
MONITOR REVEAL LINQ II (Prosthesis & Implant Heart) ×2 IMPLANT
PACK LOOP INSERTION (CUSTOM PROCEDURE TRAY) ×2 IMPLANT

## 2018-09-16 NOTE — Interval H&P Note (Signed)
History and Physical Interval Note:  09/16/2018 11:35 AM  Brian Brown  has presented today for surgery, with the diagnosis of CRYTOGENIC STROKE.  The various methods of treatment have been discussed with the patient and family. After consideration of risks, benefits and other options for treatment, the patient has consented to  Procedure(s): TRANSESOPHAGEAL ECHOCARDIOGRAM (TEE) (N/A) as a surgical intervention.  The patient's history has been reviewed, patient examined, no change in status, stable for surgery.  I have reviewed the patient's chart and labs.  Questions were answered to the patient's satisfaction.     Kirk Ruths

## 2018-09-16 NOTE — H&P (Signed)
Office Visit  08/22/2018  [place] [address]   Bartolo, Ocie Doyne, MD Cardiology Cryptogenic stroke [facility]) Dx Referred by Claris Gower, NP Reason for Visit     Additional Documentation Vitals:  BP 136/88  Pulse 56 Ht 5\' 8"  (1.727 m)  Wt 83.7 kg  SpO2 98%  BMI 28.05 kg/m  BSA 2 m  More Vitals   Flowsheets:  Anthropometrics,  MEWS Score,  Method of Visit    Encounter Info:  Billing Info,  History,  Allergies,  Detailed Report          All Notes Progress Notes by Constance Haw, MD at 08/22/2018 12:00 PM Author: Constance Haw, MD Author Type: Physician Filed: 08/22/2018 12:29 PM  Note Status: Signed Cosign: Cosign Not Required Encounter Date: 08/22/2018  Editor: Constance Haw, MD (Physician)   Expand All Collapse All    Electrophysiology Office Note  Date: 08/22/2018  ID: [name], DOB [birth year], MRN [id number]  PCP: Seward Carol, MD  Cardiologist:  Primary Electrophysiologist: Will Meredith Leeds, MD  No chief complaint on file.  History of Present Illness:  FRANS GONNERMAN is a 85 y.o. male who is being seen today for the evaluation of CVA at the request of Claris Gower, NP. Presenting today for electrophysiology evaluation.  He has a history significant for prostate cancer status post surgery, hepatitis, hypertension, sleep apnea not on CPAP, CVA. He is being referred for evaluation of Linq monitor. His stroke symptoms were left hemianopia. He had a right PCA infarct with an embolic pattern. It was recommended that he have a TEE as an outpatient followed by a loop monitor implant.  Today, he denies symptoms of palpitations, chest pain, shortness of breath, orthopnea, PND, lower extremity edema, claudication, dizziness, presyncope, syncope, bleeding, or neurologic sequela. The patient is tolerating medications without difficulties.         Past Medical History:   Diagnosis Date   . Arthritis     degenerative joint  disease   . Bleeding stomach ulcer 25 yrs ago   . Cancer [facility]) 2002    prostate, s/p surgery   . Hepatitis 1981    does not know what kind;denies jaundice   . Hypertension     treated by Dr Delfina Redwood, primary MD   . Sleep apnea 2005    does not wear a Cpap   . Stroke ([place]) 2003    w/ Rt sided weakness-some-rt leg   . Wears dentures     full top   . Wears glasses          Past Surgical History:   Procedure Laterality Date   . CARPAL TUNNEL RELEASE Left 03/29/2013    Procedure: LEFT CARPAL TUNNEL RELEASE; Surgeon: Wynonia Sours, MD; Location: [place]; Service: Orthopedics; Laterality: Left; ANESTHESIA: IV REGIONAL FAB   . CARPAL TUNNEL RELEASE Right 03/06/2014    Procedure: RIGHT CARPAL TUNNEL RELEASE; Surgeon: Daryll Brod, MD; Location: [place]; Service: Orthopedics; Laterality: Right;   . COLONOSCOPY     . HERNIA REPAIR  2009    rt inguinal hernia   . JOINT REPLACEMENT  12/2001    rt hip ;[place]  2002   . SHOULDER ARTHROSCOPY W/ ROTATOR CUFF REPAIR  2014    lt-dr whitfield   . TOTAL HIP ARTHROPLASTY  02/03/2011    Procedure: TOTAL HIP ARTHROPLASTY; Surgeon: Garald Balding, MD; Location: [place]; Service: Orthopedics; Laterality: Left;   .  TRIGGER FINGER RELEASE Left 03/29/2013    Procedure: LEFT MIDDLE/SMALL FINGER RELEASE A-1 PULLEY AND LEFT RING FINGER; Surgeon: Wynonia Sours, MD; Location: [place]; Service: Orthopedics; Laterality: Left;   . TRIGGER FINGER RELEASE Right 03/06/2014    Procedure: RIGHT MIDDLE FINGER,RIGHT RINGER FINGER; Surgeon: Daryll Brod, MD; Location: [place]; Service: Orthopedics; Laterality: Right;          Current Outpatient Medications   Medication Sig Dispense Refill   . clopidogrel (PLAVIX) 75 MG tablet Take 75 mg by mouth daily with breakfast.     . diltiazem (DILACOR XR) 240 MG 24 hr capsule Take 240 mg by mouth daily.     . metoprolol tartrate (LOPRESSOR) 25 MG  tablet Take 25 mg by mouth 2 (two) times daily.     . rosuvastatin (CRESTOR) 10 MG tablet Take 10 mg by mouth every evening.     No current facility-administered medications for this visit.     Allergies: Patient has no known allergies.  Social History: The patient reports that he quit smoking about 10 years ago. His smoking use included cigarettes. He has a 30.00 pack-year smoking history. He has never used smokeless tobacco. He reports that he does not drink alcohol or use drugs.  Family History: The patient's family history includes Heart disease in his father and mother.  ROS: Please see the history of present illness. Otherwise, review of systems is positive for none. All other systems are reviewed and negative.  PHYSICAL EXAM:  VS: BP 136/88  Pulse (!) 56  Ht 5\' 8"  (1.727 m)  Wt 184 lb 8 oz (83.7 kg)  SpO2 98%  BMI 28.05 kg/m , BMI Body mass index is 28.05 kg/m.  GEN: Well nourished, well developed, in no acute distress  HEENT: normal  Neck: no JVD, carotid bruits, or masses  Cardiac: RRR; no murmurs, rubs, or gallops,no edema  Respiratory: clear to auscultation bilaterally, normal work of breathing  GI: soft, nontender, nondistended, + BS  MS: no deformity or atrophy  Skin: warm and dry  Neuro: Strength and sensation are intact  Psych: euthymic mood, full affect  EKG: EKG is ordered today.  Personal review of the ekg ordered shows SR, rate 56  Recent Labs:  02/13/2018: ALT 15; BUN 12; Creatinine, Ser 1.28; Hemoglobin 12.7; Platelets 201; Potassium 3.4; Sodium 140  Lipid Panel     Labs (Brief)                                                                For TEE; no changes. Kirk Ruths

## 2018-09-16 NOTE — Progress Notes (Signed)
  Echocardiogram Echocardiogram Transesophageal has been performed.  Brian Brown 09/16/2018, 12:15 PM

## 2018-09-16 NOTE — Discharge Instructions (Signed)

## 2018-09-16 NOTE — Procedures (Signed)
    Transesophageal Echocardiogram Note  MASIAH BROKER [id number] July 14, 1932  Procedure: Transesophageal Echocardiogram Indications: CVA  Procedure Details Consent: Obtained Time Out: Verified patient identification, verified procedure, site/side was marked, verified correct patient position, special equipment/implants available, Radiology Safety Procedures followed,  medications/allergies/relevent history reviewed, required imaging and test results available.  Performed  Medications:  During this procedure the patient is administered a total of Versed 6 mg and Fentanyl 75 mcg  to achieve and maintain moderate conscious sedation.  The patient's heart rate, blood pressure, and oxygen saturation are monitored continuously during the procedure. The period of conscious sedation is 30 minutes, of which I was present face-to-face 100% of this time.  Normal LV function; small density on aortic valve (likely lambls excrescence); spontaneous contrast in left atrium and appendage but no thrombus; spontaneous contrast descending aorta; negative saline microcavitation study.   Complications: No apparent complications Patient did tolerate procedure well.  Kirk Ruths, MD

## 2018-09-16 NOTE — H&P (Signed)
Brian Brown has presented today for surgery, with the diagnosis of CVA.  The various methods of treatment have been discussed with the patient and family. After consideration of risks, benefits and other options for treatment, the patient has consented to  Procedure(s): LINQ implant as a surgical intervention .  Risks include but not limited to bleeding, tamponade, infection, pneumothorax, among others. The patient's history has been reviewed, patient examined, no change in status, stable for surgery.  I have reviewed the patient's chart and labs.  Questions were answered to the patient's satisfaction.    Will Curt Bears, MD 09/16/2018 1:55 PM

## 2018-09-18 ENCOUNTER — Encounter (HOSPITAL_COMMUNITY): Payer: Self-pay | Admitting: Cardiology

## 2018-09-19 ENCOUNTER — Encounter (HOSPITAL_COMMUNITY): Payer: Self-pay | Admitting: Cardiology

## 2018-09-29 ENCOUNTER — Other Ambulatory Visit: Payer: Self-pay

## 2018-09-29 ENCOUNTER — Telehealth (INDEPENDENT_AMBULATORY_CARE_PROVIDER_SITE_OTHER): Payer: Self-pay | Admitting: Student

## 2018-09-29 LAB — CUP PACEART REMOTE DEVICE CHECK
Date Time Interrogation Session: 20200924152606
Implantable Pulse Generator Implant Date: 20200911

## 2018-09-29 NOTE — Progress Notes (Signed)
Remote Loop check. Transmission from this am reviewed. Pt with 1 episode of false AF. Reviewed with Dr. Lovena Le. Appears to be sinus tachycardia with PVCs and PACs. No other episodes. Monthly summary reports and ROV as needed.  Pt verbalized that his wound is well healed, without drainage, bleeding, or swelling. He does not have access to a smart phone for video chat or to send a picture.   Pt knows to call back with any changes to his wound site. Otherwise follow up prn with monthly summary reports.   Brian Brown [address] [place], PA-C  09/29/2018 3:25 PM

## 2018-10-10 ENCOUNTER — Telehealth: Payer: Self-pay | Admitting: *Deleted

## 2018-10-10 NOTE — Telephone Encounter (Signed)
LINQ alerts received for AT/AF episodes. P-waves difficult to discern on presenting ECG. Longest "AF" episode 10hr 69min duration, appears true AF. Routed to Dr. Curt Bears for review and confirmation of AF.

## 2018-10-11 NOTE — Telephone Encounter (Signed)
Spoke with patient. He was not aware of AF episodes. Possible COVID exposure on 09/24/18, never developed any symptoms. Discussed with Sherri, RN--safe to come into office at this point as it has been >2 weeks. Pt is agreeable to an appointment with Dr. Curt Bears on Thursday, 10/13/18 at 10:00am to discuss [place]. Aware of office address and COVID precautions. No further questions at this time.

## 2018-10-11 NOTE — Telephone Encounter (Signed)
Appears to be AF. Needs clinic follow up to discuss anticoagulation.

## 2018-10-13 ENCOUNTER — Ambulatory Visit (INDEPENDENT_AMBULATORY_CARE_PROVIDER_SITE_OTHER): Payer: Medicare Other | Admitting: Cardiology

## 2018-10-13 ENCOUNTER — Encounter: Payer: Self-pay | Admitting: Cardiology

## 2018-10-13 ENCOUNTER — Other Ambulatory Visit: Payer: Self-pay

## 2018-10-13 VITALS — BP 136/78 | HR 55 | Ht 68.0 in | Wt 186.4 lb

## 2018-10-13 DIAGNOSIS — Z79899 Other long term (current) drug therapy: Secondary | ICD-10-CM | POA: Diagnosis not present

## 2018-10-13 DIAGNOSIS — I480 Paroxysmal atrial fibrillation: Secondary | ICD-10-CM

## 2018-10-13 DIAGNOSIS — I639 Cerebral infarction, unspecified: Secondary | ICD-10-CM | POA: Diagnosis not present

## 2018-10-13 LAB — CBC
Hematocrit: 39.5 % (ref 37.5–51.0)
Hemoglobin: 13.7 g/dL (ref 13.0–17.7)
MCH: 28.9 pg (ref 26.6–33.0)
MCHC: 34.7 g/dL (ref 31.5–35.7)
MCV: 83 fL (ref 79–97)
Platelets: 233 10*3/uL (ref 150–450)
RBC: 4.74 x10E6/uL (ref 4.14–5.80)
RDW: 12.6 % (ref 11.6–15.4)
WBC: 6.5 10*3/uL (ref 3.4–10.8)

## 2018-10-13 LAB — BASIC METABOLIC PANEL
BUN/Creatinine Ratio: 12 (ref 10–24)
BUN: 13 mg/dL (ref 8–27)
CO2: 25 mmol/L (ref 20–29)
Calcium: 9.2 mg/dL (ref 8.6–10.2)
Chloride: 99 mmol/L (ref 96–106)
Creatinine, Ser: 1.11 mg/dL (ref 0.76–1.27)
GFR calc Af Amer: 70 mL/min/{1.73_m2} (ref >59)
GFR calc non Af Amer: 61 mL/min/{1.73_m2} (ref >59)
Glucose: 88 mg/dL (ref 65–99)
Potassium: 3.8 mmol/L (ref 3.5–5.2)
Sodium: 138 mmol/L (ref 134–144)

## 2018-10-13 NOTE — Patient Instructions (Addendum)
Medication Instructions:  Your physician has recommended you make the following change in your medication:  DO NOT MAKE THESE CHANGES UNTIL  THE NURSE CALLS YOU WITH BLOOD WORK RESULTS -- 1. STOP Plavix 2. START Eliquis ______ mg twice a day (your lab work will help determine dosing of this medication)  *If you need a refill on your cardiac medications before your next appointment, please call your pharmacy*  Labwork: Today: BMET and CBC If you have labs (blood work) drawn today and your tests are completely normal, you will receive your results only by:  [place] (if you have MyChart) OR  A paper copy in the mail If you have any lab test that is abnormal or we need to change your treatment, we will call you to review the results.  Testing/Procedures: None ordered  Follow-Up: Remote monitoring is used to monitor your Pacemaker or ICD from home. This monitoring reduces the number of office visits required to check your device to one time per year. It allows [place] to keep an eye on the functioning of your device to ensure it is working properly. You are scheduled for a device check from home on 11/13/2018. You may send your transmission at any time that day. If you have a wireless device, the transmission will be sent automatically. After your physician reviews your transmission, you will receive a postcard with your next transmission date.  At [facility], you and your health needs are our priority.  As part of our continuing mission to provide you with exceptional heart care, we have created designated Provider Care Teams.  These Care Teams include your primary Cardiologist (physician) and Advanced Practice Providers (APPs -  Physician Assistants and Nurse Practitioners) who all work together to provide you with the care you need, when you need it.  You will need a follow up appointment in 6 months.  Please call our office 2 months in advance to schedule this appointment.  You may see  Dr Curt Bears or one of the following Advanced Practice Providers on your designated Care Team:    Chanetta Marshall, NP  Tommye Standard, PA-C  Oda Kilts, [place]  Your physician wants you to follow-up in: 1 year with Dr. Curt Bears.  You will receive a reminder letter in the mail two months in advance. If you don't receive a letter, please call our office to schedule the follow-up appointment. Thank you for choosing [facility]!!   Trinidad Curet, RN [phone]  Any Other Special Instructions Will Be Listed Below (If Applicable).  Apixaban oral tablets What is this medicine? APIXABAN (a PIX a ban) is an anticoagulant (blood thinner). It is used to lower the chance of stroke in people with a medical condition called atrial fibrillation. It is also used to treat or prevent blood clots in the lungs or in the veins. This medicine may be used for other purposes; ask your health care provider or pharmacist if you have questions. COMMON BRAND NAME(S): Eliquis What should I tell my health care provider before I take this medicine? They need to know if you have any of these conditions:  antiphospholipid antibody syndrome  bleeding disorders  bleeding in the brain  blood in your stools (black or tarry stools) or if you have blood in your vomit  history of blood clots  history of stomach bleeding  kidney disease  liver disease  mechanical heart valve  an unusual or allergic reaction to apixaban, other medicines, foods, dyes, or preservatives  pregnant  or trying to get pregnant  breast-feeding How should I use this medicine? Take this medicine by mouth with a glass of water. Follow the directions on the prescription label. You can take it with or without food. If it upsets your stomach, take it with food. Take your medicine at regular intervals. Do not take it more often than directed. Do not stop taking except on your doctor's advice. Stopping this medicine may increase your risk of a  blood clot. Be sure to refill your prescription before you run out of medicine. Talk to your pediatrician regarding the use of this medicine in children. Special care may be needed. Overdosage: If you think you have taken too much of this medicine contact a poison control center or emergency room at once. NOTE: This medicine is only for you. Do not share this medicine with others. What if I miss a dose? If you miss a dose, take it as soon as you can. If it is almost time for your next dose, take only that dose. Do not take double or extra doses. What may interact with this medicine? This medicine may interact with the following:  aspirin and aspirin-like medicines  certain medicines for fungal infections like ketoconazole and itraconazole  certain medicines for seizures like carbamazepine and phenytoin  certain medicines that treat or prevent blood clots like warfarin, enoxaparin, and dalteparin  clarithromycin  NSAIDs, medicines for pain and inflammation, like ibuprofen or naproxen  rifampin  ritonavir  St. John's wort This list may not describe all possible interactions. Give your health care provider a list of all the medicines, herbs, non-prescription drugs, or dietary supplements you use. Also tell them if you smoke, drink alcohol, or use illegal drugs. Some items may interact with your medicine. What should I watch for while using this medicine? Visit your healthcare professional for regular checks on your progress. You may need blood work done while you are taking this medicine. Your condition will be monitored carefully while you are receiving this medicine. It is important not to miss any appointments. Avoid sports and activities that might cause injury while you are using this medicine. Severe falls or injuries can cause unseen bleeding. Be careful when using sharp tools or knives. Consider using an Copy. Take special care brushing or flossing your teeth. Report any  injuries, bruising, or red spots on the skin to your healthcare professional. If you are going to need surgery or other procedure, tell your healthcare professional that you are taking this medicine. Wear a medical ID bracelet or chain. Carry a card that describes your disease and details of your medicine and dosage times. What side effects may I notice from receiving this medicine? Side effects that you should report to your doctor or health care professional as soon as possible:  allergic reactions like skin rash, itching or hives, swelling of the face, lips, or tongue  signs and symptoms of bleeding such as bloody or black, tarry stools; red or dark-brown urine; spitting up blood or brown material that looks like coffee grounds; red spots on the skin; unusual bruising or bleeding from the eye, gums, or nose  signs and symptoms of a blood clot such as chest pain; shortness of breath; pain, swelling, or warmth in the leg  signs and symptoms of a stroke such as changes in vision; confusion; trouble speaking or understanding; severe headaches; sudden numbness or weakness of the face, arm or leg; trouble walking; dizziness; loss of coordination This list may  not describe all possible side effects. Call your doctor for medical advice about side effects. You may report side effects to FDA at 1-800-FDA-1088. Where should I keep my medicine? Keep out of the reach of children. Store at room temperature between 20 and 25 degrees C (68 and 77 degrees F). Throw away any unused medicine after the expiration date. NOTE: This sheet is a summary. It may not cover all possible information. If you have questions about this medicine, talk to your doctor, pharmacist, or health care provider.  2020 Elsevier/Gold Standard (2017-09-01 17:39:34)    Atrial Fibrillation  Atrial fibrillation is a type of heartbeat that is irregular or fast (rapid). If you have this condition, your heart beats without any order. This  makes it hard for your heart to pump blood in a normal way. Having this condition gives you more risk for stroke, heart failure, and other heart problems. Atrial fibrillation may start all of a sudden and then stop on its own, or it may become a long-lasting problem. What are the causes? This condition may be caused by heart conditions, such as:  High blood pressure.  Heart failure.  Heart valve disease.  Heart surgery. Other causes include:  Pneumonia.  Obstructive sleep apnea.  Lung cancer.  Thyroid disease.  Drinking too much alcohol. Sometimes the cause is not known. What increases the risk? You are more likely to develop this condition if:  You smoke.  You are older.  You have diabetes.  You are overweight.  You have a family history of this condition.  You exercise often and hard. What are the signs or symptoms? Common symptoms of this condition include:  A feeling like your heart is beating very fast.  Chest pain.  Feeling short of breath.  Feeling light-headed or weak.  Getting tired easily. Follow these instructions at home: Medicines  Take over-the-counter and prescription medicines only as told by your doctor.  If your doctor gives you a blood-thinning medicine, take it exactly as told. Taking too much of it can cause bleeding. Taking too little of it does not protect you against clots. Clots can cause a stroke. Lifestyle      Do not use any tobacco products. These include cigarettes, chewing tobacco, and e-cigarettes. If you need help quitting, ask your doctor.  Do not drink alcohol.  Do not drink beverages that have caffeine. These include coffee, soda, and tea.  Follow diet instructions as told by your doctor.  Exercise regularly as told by your doctor. General instructions  If you have a condition that causes breathing to stop for a short period of time (apnea), treat it as told by your doctor.  Keep a healthy weight. Do not use  diet pills unless your doctor says they are safe for you. Diet pills may make heart problems worse.  Keep all follow-up visits as told by your doctor. This is important. Contact a doctor if:  You notice a change in the speed, rhythm, or strength of your heartbeat.  You are taking a blood-thinning medicine and you see more bruising.  You get tired more easily when you move or exercise.  You have a sudden change in weight. Get help right away if:   You have pain in your chest or your belly (abdomen).  You have trouble breathing.  You have blood in your vomit, poop, or pee (urine).  You have any signs of a stroke. "BE FAST" is an easy way to remember the main warning  signs: ? B - Balance. Signs are dizziness, sudden trouble walking, or loss of balance. ? E - Eyes. Signs are trouble seeing or a change in how you see. ? F - Face. Signs are sudden weakness or loss of feeling in the face, or the face or eyelid drooping on one side. ? A - Arms. Signs are weakness or loss of feeling in an arm. This happens suddenly and usually on one side of the body. ? S - Speech. Signs are sudden trouble speaking, slurred speech, or trouble understanding what people say. ? T - Time. Time to call emergency services. Write down what time symptoms started.  You have other signs of a stroke, such as: ? A sudden, very bad headache with no known cause. ? Feeling sick to your stomach (nausea). ? Throwing up (vomiting). ? Jerky movements you cannot control (seizure). These symptoms may be an emergency. Do not wait to see if the symptoms will go away. Get medical help right away. Call your local emergency services (911 in the [place] Do not drive yourself to the hospital. Summary  Atrial fibrillation is a type of heartbeat that is irregular or fast (rapid).  You are at higher risk of this condition if you smoke, are older, have diabetes, or are overweight.  Follow your doctor's instructions about medicines,  diet, exercise, and follow-up visits.  Get help right away if you think that you have signs of a stroke. This information is not intended to replace advice given to you by your health care provider. Make sure you discuss any questions you have with your health care provider. Document Released: 10/01/2007 Document Revised: 02/25/2017 Document Reviewed: 02/12/2017 Elsevier Patient Education  2020 [employer].

## 2018-10-13 NOTE — Addendum Note (Signed)
Addended by: Stanton Kidney on: 10/13/2018 10:43 AM   Modules accepted: Orders

## 2018-10-13 NOTE — Progress Notes (Signed)
Electrophysiology Office Note   Date:  10/13/2018   ID:  Brian Brown, DOB 11-14-1932, MRN [id number]  PCP:  Seward Carol, MD  Cardiologist:   Primary Electrophysiologist:  Will Meredith Leeds, MD    No chief complaint on file.    History of Present Illness: Brian Brown is a 85 y.o. male who is being seen today for the evaluation of CVA at the request of Seward Carol, MD. Presenting today for electrophysiology evaluation.  He has a history significant for prostate cancer status post surgery, hepatitis, hypertension, sleep apnea not on CPAP, CVA.  He is being referred for evaluation of Linq monitor.  His stroke symptoms were left hemianopia.  He had a right PCA infarct with an embolic pattern.  It was recommended that he have a TEE as an outpatient followed by a loop monitor implant.  Today, denies symptoms of palpitations, chest pain, shortness of breath, orthopnea, PND, lower extremity edema, claudication, dizziness, presyncope, syncope, bleeding, or neurologic sequela. The patient is tolerating medications without difficulties.     Past Medical History:  Diagnosis Date  . Arthritis    degenerative joint disease  . Bleeding stomach ulcer 25 yrs ago  . Cancer [facility]) 2002   prostate, s/p surgery  . Hepatitis 1981   does not know what kind;denies jaundice  . Hypertension    treated by Dr Delfina Redwood, primary MD  . Sleep apnea 2005   does not wear a Cpap  . Stroke ([place]) 2003   w/ Rt sided weakness-some-rt leg  . Wears dentures    full top  . Wears glasses    Past Surgical History:  Procedure Laterality Date  . BUBBLE STUDY  09/16/2018   Procedure: BUBBLE STUDY;  Surgeon: Lelon Perla, MD;  Location: [place];  Service: Cardiovascular;;  . CARPAL TUNNEL RELEASE Left 03/29/2013   Procedure: LEFT CARPAL TUNNEL RELEASE;  Surgeon: Wynonia Sours, MD;  Location: [place];  Service: Orthopedics;  Laterality: Left;  ANESTHESIA: IV REGIONAL FAB  .  CARPAL TUNNEL RELEASE Right 03/06/2014   Procedure: RIGHT CARPAL TUNNEL RELEASE;  Surgeon: Daryll Brod, MD;  Location: [place];  Service: Orthopedics;  Laterality: Right;  . COLONOSCOPY    . HERNIA REPAIR  2009   rt inguinal hernia  . JOINT REPLACEMENT  12/2001   rt hip ;[place] N/A 09/16/2018   Procedure: LOOP RECORDER INSERTION;  Surgeon: Constance Haw, MD;  Location: [place] [facility];  Service: Cardiovascular;  Laterality: N/A;  . PROSTATECTOMY  2002  . SHOULDER ARTHROSCOPY W/ ROTATOR CUFF REPAIR  2014   lt-dr whitfield  . TEE WITHOUT CARDIOVERSION N/A 09/16/2018   Procedure: TRANSESOPHAGEAL ECHOCARDIOGRAM (TEE);  Surgeon: Lelon Perla, MD;  Location: [facility] ENDOS[facility];  Service: Cardiovascular;  Laterality: N/A;  . TOTAL HIP ARTHROPLASTY  02/03/2011   Procedure: TOTAL HIP ARTHROPLASTY;  Surgeon: Garald Balding, MD;  Location: [place];  Service: Orthopedics;  Laterality: Left;  . TRIGGER FINGER RELEASE Left 03/29/2013   Procedure: LEFT MIDDLE/SMALL FINGER RELEASE A-1 PULLEY AND LEFT RING FINGER;  Surgeon: Wynonia Sours, MD;  Location: [place];  Service: Orthopedics;  Laterality: Left;  . TRIGGER FINGER RELEASE Right 03/06/2014   Procedure: RIGHT MIDDLE FINGER,RIGHT RINGER FINGER;  Surgeon: Daryll Brod, MD;  Location: [place];  Service: Orthopedics;  Laterality: Right;     Current Outpatient Medications  Medication Sig Dispense Refill  .  clopidogrel (PLAVIX) 75 MG tablet Take 75 mg by mouth daily with breakfast.    . diltiazem (DILACOR XR) 240 MG 24 hr capsule Take 240 mg by mouth daily.    . furosemide (LASIX) 20 MG tablet Take 20 mg by mouth daily as needed.    . metoprolol tartrate (LOPRESSOR) 25 MG tablet Take 25 mg by mouth 2 (two) times daily.    . Omega 3-6-9 Fatty Acids (OMEGA 3-6-9 COMPLEX PO) Take 2-3 capsules by mouth See admin instructions. 3 caps in the morning, and 2 caps in the  evening    . rosuvastatin (CRESTOR) 10 MG tablet Take 10 mg by mouth every evening.     No current facility-administered medications for this visit.     Allergies:   Patient has no known allergies.   Social History:  The patient  reports that he quit smoking about 10 years ago. His smoking use included cigarettes. He has a 30.00 pack-year smoking history. He has never used smokeless tobacco. He reports that he does not drink alcohol or use drugs.   Family History:  The patient's family history includes Heart disease in his father and mother.    ROS:  Please see the history of present illness.   Otherwise, review of systems is positive for none.   All other systems are reviewed and negative.   PHYSICAL EXAM: VS:  BP 136/78   Pulse (!) 55   Ht 5\' 8"  (1.727 m)   Wt 186 lb 6.4 oz (84.6 kg)   BMI 28.34 kg/m  , BMI Body mass index is 28.34 kg/m. GEN: Well nourished, well developed, in no acute distress  HEENT: normal  Neck: no JVD, carotid bruits, or masses Cardiac: RRR; no murmurs, rubs, or gallops,no edema  Respiratory:  clear to auscultation bilaterally, normal work of breathing GI: soft, nontender, nondistended, + BS MS: no deformity or atrophy  Skin: warm and dry, device site well healed Neuro:  Strength and sensation are intact Psych: euthymic mood, full affect  EKG:  EKG is ordered today. Personal review of the ekg ordered shows this rhythm, PVCs, rate 55  Personal review of the remote device interrogation. Results in [place]: 02/13/2018: ALT 15; BUN 12; Creatinine, Ser 1.28; Hemoglobin 12.7; Platelets 201; Potassium 3.4; Sodium 140    Lipid Panel     Component Value Date/Time   CHOL 117 02/14/2018 0303   TRIG 48 02/14/2018 0303   HDL 45 02/14/2018 0303   CHOLHDL 2.6 02/14/2018 0303   VLDL 10 02/14/2018 0303   LDLCALC 62 02/14/2018 0303     Wt Readings from Last 3 Encounters:  10/13/18 186 lb 6.4 oz (84.6 kg)  09/16/18 180 lb (81.6 kg)  08/22/18  184 lb 8 oz (83.7 kg)      Other studies Reviewed: Additional studies/ records that were reviewed today include: TTE 02/14/2018 Review of the above records today demonstrates:   1. The left ventricle has normal systolic function of 123456. The cavity size was normal. There is mildly increased left ventricular wall thickness. Left ventricular diastology could not be evaluated due to indeterminent diastolic function.  2. The right ventricle has normal systolic function. The cavity was normal. There is no increase in right ventricular wall thickness.  3. The mitral valve is normal in structure. There is mild mitral annular calcification present.  4. The tricuspid valve is normal in structure.  5. The aortic valve is tricuspid There is mild thickening and moderate calcification  of the aortic valve.  6. The pulmonic valve was thickened. Pulmonic valve regurgitation is mild by color flow Doppler.   ASSESSMENT AND PLAN:  1.  Paroxysmal atrial fibrillation: Has a history of cryptogenic stroke.  Atrial fibrillation found on link monitor.  We will thus plan for anticoagulation with Eliquis.  We will stop Plavix today.    This patients CHA2DS2-VASc Score and unadjusted Ischemic Stroke Rate (% per year) is equal to 7.2 % stroke rate/year from a score of 5  Above score calculated as 1 point each if present [CHF, HTN, DM, Vascular=MI/PAD/Aortic Plaque, Age if 65-74, or Male] Above score calculated as 2 points each if present [Age > 75, or Stroke/TIA/TE]   2.  Hypertension: well controlled  3.  Hyperlipidemia: Crestor per neurology and primary care    Current medicines are reviewed at length with the patient today.   The patient does not have concerns regarding his medicines.  The following changes were made today:  none  Labs/ tests ordered today include:  Orders Placed This Encounter  Procedures  . EKG 12-Lead     Disposition:   FU with Will Camnitz 1 year  Signed, Will Meredith Leeds, MD  10/13/2018 10:22 AM     [facility] [address] [place] [address] [phone] (office) [phone] (fax)

## 2018-10-18 ENCOUNTER — Other Ambulatory Visit: Payer: Self-pay

## 2018-10-18 ENCOUNTER — Inpatient Hospital Stay (HOSPITAL_COMMUNITY)
Admission: EM | Admit: 2018-10-18 | Discharge: 2018-10-20 | DRG: 281 | Disposition: A | Discharge status: Home / Self Care | Payer: Medicare Other | Attending: Cardiology | Admitting: Cardiology

## 2018-10-18 ENCOUNTER — Encounter (HOSPITAL_COMMUNITY): Payer: Self-pay | Admitting: Emergency Medicine

## 2018-10-18 ENCOUNTER — Emergency Department (HOSPITAL_COMMUNITY): Payer: Medicare Other

## 2018-10-18 DIAGNOSIS — R079 Chest pain, unspecified: Secondary | ICD-10-CM | POA: Diagnosis not present

## 2018-10-18 DIAGNOSIS — Z87891 Personal history of nicotine dependence: Secondary | ICD-10-CM

## 2018-10-18 DIAGNOSIS — I480 Paroxysmal atrial fibrillation: Secondary | ICD-10-CM | POA: Diagnosis present

## 2018-10-18 DIAGNOSIS — I214 Non-ST elevation (NSTEMI) myocardial infarction: Secondary | ICD-10-CM | POA: Diagnosis present

## 2018-10-18 DIAGNOSIS — I639 Cerebral infarction, unspecified: Secondary | ICD-10-CM | POA: Diagnosis present

## 2018-10-18 DIAGNOSIS — E876 Hypokalemia: Secondary | ICD-10-CM | POA: Diagnosis not present

## 2018-10-18 DIAGNOSIS — I10 Essential (primary) hypertension: Secondary | ICD-10-CM | POA: Diagnosis not present

## 2018-10-18 DIAGNOSIS — H53462 Homonymous bilateral field defects, left side: Secondary | ICD-10-CM | POA: Diagnosis present

## 2018-10-18 DIAGNOSIS — Z79899 Other long term (current) drug therapy: Secondary | ICD-10-CM

## 2018-10-18 DIAGNOSIS — Z7902 Long term (current) use of antithrombotics/antiplatelets: Secondary | ICD-10-CM

## 2018-10-18 DIAGNOSIS — Z8249 Family history of ischemic heart disease and other diseases of the circulatory system: Secondary | ICD-10-CM

## 2018-10-18 DIAGNOSIS — I69351 Hemiplegia and hemiparesis following cerebral infarction affecting right dominant side: Secondary | ICD-10-CM | POA: Diagnosis not present

## 2018-10-18 DIAGNOSIS — Z23 Encounter for immunization: Secondary | ICD-10-CM

## 2018-10-18 DIAGNOSIS — G4733 Obstructive sleep apnea (adult) (pediatric): Secondary | ICD-10-CM | POA: Diagnosis present

## 2018-10-18 DIAGNOSIS — Z20828 Contact with and (suspected) exposure to other viral communicable diseases: Secondary | ICD-10-CM | POA: Diagnosis present

## 2018-10-18 DIAGNOSIS — E785 Hyperlipidemia, unspecified: Secondary | ICD-10-CM | POA: Diagnosis present

## 2018-10-18 DIAGNOSIS — Z96642 Presence of left artificial hip joint: Secondary | ICD-10-CM | POA: Diagnosis present

## 2018-10-18 DIAGNOSIS — I2510 Atherosclerotic heart disease of native coronary artery without angina pectoris: Secondary | ICD-10-CM | POA: Diagnosis present

## 2018-10-18 HISTORY — DX: Paroxysmal atrial fibrillation: I48.0

## 2018-10-18 LAB — BASIC METABOLIC PANEL
Anion gap: 13 (ref 5–15)
BUN: 17 mg/dL (ref 8–23)
CO2: 24 mmol/L (ref 22–32)
Calcium: 9.1 mg/dL (ref 8.9–10.3)
Chloride: 101 mmol/L (ref 98–111)
Creatinine, Ser: 1.31 mg/dL — ABNORMAL HIGH (ref 0.61–1.24)
GFR calc Af Amer: 58 mL/min — ABNORMAL LOW (ref >60)
GFR calc non Af Amer: 50 mL/min — ABNORMAL LOW (ref >60)
Glucose, Bld: 138 mg/dL — ABNORMAL HIGH (ref 70–99)
Potassium: 2.9 mmol/L — ABNORMAL LOW (ref 3.5–5.1)
Sodium: 138 mmol/L (ref 135–145)

## 2018-10-18 LAB — CBC
HCT: 44 % (ref 39.0–52.0)
Hemoglobin: 14.8 g/dL (ref 13.0–17.0)
MCH: 30 pg (ref 26.0–34.0)
MCHC: 33.6 g/dL (ref 30.0–36.0)
MCV: 89.1 fL (ref 80.0–100.0)
Platelets: 219 10*3/uL (ref 150–400)
RBC: 4.94 MIL/uL (ref 4.22–5.81)
RDW: 13.6 % (ref 11.5–15.5)
WBC: 8.5 10*3/uL (ref 4.0–10.5)
nRBC: 0 % (ref 0.0–0.2)

## 2018-10-18 LAB — TROPONIN I (HIGH SENSITIVITY)
Troponin I (High Sensitivity): 30 ng/L — ABNORMAL HIGH (ref <18)
Troponin I (High Sensitivity): 1129 ng/L (ref <18)

## 2018-10-18 MED ORDER — HEPARIN BOLUS VIA INFUSION
4000.0000 [IU] | Freq: Once | INTRAVENOUS | Status: AC
  Administered 2018-10-19: 4000 [IU] via INTRAVENOUS
  Filled 2018-10-18: qty 4000

## 2018-10-18 MED ORDER — HEPARIN (PORCINE) 25000 UT/250ML-% IV SOLN
1000.0000 [IU]/h | INTRAVENOUS | Status: DC
  Administered 2018-10-19: 1000 [IU]/h via INTRAVENOUS
  Filled 2018-10-18: qty 250

## 2018-10-18 MED ORDER — NITROGLYCERIN IN D5W 200-5 MCG/ML-% IV SOLN
0.0000 ug/min | INTRAVENOUS | Status: DC
  Administered 2018-10-19: 5 ug/min via INTRAVENOUS
  Filled 2018-10-18: qty 250

## 2018-10-18 MED ORDER — SODIUM CHLORIDE 0.9% FLUSH: 3.0000 mL | Freq: Once | INTRAVENOUS | Status: DC

## 2018-10-18 NOTE — ED Notes (Signed)
Elevated Troponin result , will transfer patient to ER bed ASAP, charge nurse notified.

## 2018-10-18 NOTE — Progress Notes (Signed)
ANTICOAGULATION CONSULT NOTE - Initial Consult  Pharmacy Consult for Heparin Indication: chest pain/ACS  No Known Allergies  Patient Measurements: Weight: 186 lb 8.2 oz (84.6 kg)  Vital Signs: Temp: 98.4 F (36.9 C) (10/13 1819) BP: 173/101 (10/13 1819) Pulse Rate: 89 (10/13 1819)  Labs: Recent Labs    10/18/18 1818 10/18/18 2146  HGB 14.8  --   HCT 44.0  --   PLT 219  --   CREATININE 1.31*  --   TROPONINIHS 30* 1,129*    Estimated Creatinine Clearance: 44.5 mL/min (A) (by C-G formula based on SCr of 1.31 mg/dL (H)).   Medical History: Past Medical History:  Diagnosis Date  . Arthritis    degenerative joint disease  . Bleeding stomach ulcer 25 yrs ago  . Cancer [facility]) 2002   prostate, s/p surgery  . Hepatitis 1981   does not know what kind;denies jaundice  . Hypertension    treated by Dr Delfina Redwood, primary MD  . Sleep apnea 2005   does not wear a Cpap  . Stroke ([place]) 2003   w/ Rt sided weakness-some-rt leg  . Wears dentures    full top  . Wears glasses     Medications:  No current facility-administered medications on file prior to encounter.    Current Outpatient Medications on File Prior to Encounter  Medication Sig Dispense Refill  . clopidogrel (PLAVIX) 75 MG tablet Take 75 mg by mouth daily with breakfast.    . diltiazem (DILACOR XR) 240 MG 24 hr capsule Take 240 mg by mouth daily.    . furosemide (LASIX) 20 MG tablet Take 20 mg by mouth daily as needed.    . metoprolol tartrate (LOPRESSOR) 25 MG tablet Take 25 mg by mouth 2 (two) times daily.    . Omega 3-6-9 Fatty Acids (OMEGA 3-6-9 COMPLEX PO) Take 2-3 capsules by mouth See admin instructions. 3 caps in the morning, and 2 caps in the evening    . rosuvastatin (CRESTOR) 10 MG tablet Take 10 mg by mouth every evening.       Assessment: 85 y.o. male with chest pain for heparin  Goal of Therapy:  Heparin level 0.3-0.7 units/ml Monitor platelets by anticoagulation protocol: Yes   Plan:  Heparin  4000 units IV bolus, then start heparin 1000 units/hr Check heparin level in 6 hours.   Hayzen Lorenson, Bronson Curb 10/18/2018,11:58 PM

## 2018-10-18 NOTE — ED Provider Notes (Signed)
[facility] EMERGENCY DEPARTMENT Provider Note   CSN: [id number] Arrival date & time: 10/18/18  1809     History   Chief Complaint Chief Complaint  Patient presents with  . Chest Pain    HPI Brian Brown is a 85 y.o. male.     Patient presented to the emergency department with 1 hour of substernal chest pain radiating to the back of both arms and the jaw.  He reports that he walked his dog tonight, came in and sat down and then started to notice pain.  He denies shortness of breath and nausea but did get diaphoretic.  Patient presented to the ER with continued pain.  He was started on chest pain protocol and placed in the waiting room.  He reports that while he was here the pain has continued but has "settled down a little".     Past Medical History:  Diagnosis Date  . Arthritis    degenerative joint disease  . Bleeding stomach ulcer 25 yrs ago  . Cancer [facility]) 2002   prostate, s/p surgery  . Hepatitis 1981   does not know what kind;denies jaundice  . Hypertension    treated by Dr Delfina Redwood, primary MD  . Sleep apnea 2005   does not wear a Cpap  . Stroke ([place]) 2003   w/ Rt sided weakness-some-rt leg  . Wears dentures    full top  . Wears glasses     Patient Active Problem List   Diagnosis Date Noted  . NSTEMI (non-ST elevated myocardial infarction) ([place])   . Acute CVA (cerebrovascular accident) ([place]) 02/14/2018  . TIA (transient ischemic attack) 02/13/2018  . Hepatitis C reactive 02/06/2011  . Hyponatremia 02/06/2011  . Postoperative anemia due to acute blood loss 02/05/2011  . Hypokalemia 02/05/2011  . Osteoarthritis of hip 01/27/2011  . Hypertension 01/27/2011  . Sleep apnea 01/27/2011  . Stroke/cerebrovascular accident ([place]) 01/27/2011  . Cancer of prostate ([place]) 01/27/2011    Past Surgical History:  Procedure Laterality Date  . BUBBLE STUDY  09/16/2018   Procedure: BUBBLE STUDY;  Surgeon: Lelon Perla, MD;  Location: [place];  Service: Cardiovascular;;  . CARPAL TUNNEL RELEASE Left 03/29/2013   Procedure: LEFT CARPAL TUNNEL RELEASE;  Surgeon: Wynonia Sours, MD;  Location: [place];  Service: Orthopedics;  Laterality: Left;  ANESTHESIA: IV REGIONAL FAB  . CARPAL TUNNEL RELEASE Right 03/06/2014   Procedure: RIGHT CARPAL TUNNEL RELEASE;  Surgeon: Daryll Brod, MD;  Location: [place];  Service: Orthopedics;  Laterality: Right;  . COLONOSCOPY    . HERNIA REPAIR  2009   rt inguinal hernia  . JOINT REPLACEMENT  12/2001   rt hip ;[place] N/A 09/16/2018   Procedure: LOOP RECORDER INSERTION;  Surgeon: Constance Haw, MD;  Location: [place] CV LAB;  Service: Cardiovascular;  Laterality: N/A;  . PROSTATECTOMY  2002  . SHOULDER ARTHROSCOPY W/ ROTATOR CUFF REPAIR  2014   lt-dr whitfield  . TEE WITHOUT CARDIOVERSION N/A 09/16/2018   Procedure: TRANSESOPHAGEAL ECHOCARDIOGRAM (TEE);  Surgeon: Lelon Perla, MD;  Location: [facility] ENDOSCOPY;  Service: Cardiovascular;  Laterality: N/A;  . TOTAL HIP ARTHROPLASTY  02/03/2011   Procedure: TOTAL HIP ARTHROPLASTY;  Surgeon: Garald Balding, MD;  Location: [place];  Service: Orthopedics;  Laterality: Left;  . TRIGGER FINGER RELEASE Left 03/29/2013   Procedure: LEFT MIDDLE/SMALL FINGER RELEASE A-1 PULLEY AND LEFT RING FINGER;  Surgeon: Dillard Essex  Fredna Dow, MD;  Location: [place];  Service: Orthopedics;  Laterality: Left;  . TRIGGER FINGER RELEASE Right 03/06/2014   Procedure: RIGHT MIDDLE FINGER,RIGHT RINGER FINGER;  Surgeon: Daryll Brod, MD;  Location: [place];  Service: Orthopedics;  Laterality: Right;        Home Medications    Prior to Admission medications   Medication Sig Start Date End Date Taking? Authorizing Provider  clopidogrel (PLAVIX) 75 MG tablet Take 75 mg by mouth daily with breakfast.    [provider]  diltiazem (DILACOR XR) 240 MG 24 hr capsule  Take 240 mg by mouth daily.    [provider]  furosemide (LASIX) 20 MG tablet Take 20 mg by mouth daily as needed.    [provider]  metoprolol tartrate (LOPRESSOR) 25 MG tablet Take 25 mg by mouth 2 (two) times daily.    [provider]  Omega 3-6-9 Fatty Acids (OMEGA 3-6-9 COMPLEX PO) Take 2-3 capsules by mouth See admin instructions. 3 caps in the morning, and 2 caps in the evening    [provider]  rosuvastatin (CRESTOR) 10 MG tablet Take 10 mg by mouth every evening. 12/18/17   [provider]    Family History Family History  Problem Relation Age of Onset  . Heart disease Mother   . Heart disease Father     Social History Social History   Tobacco Use  . Smoking status: Former Smoker    Packs/day: 1.00    Years: 30.00    Pack years: 30.00    Types: Cigarettes    Quit date: 01/27/2008    Years since quitting: 10.7  . Smokeless tobacco: Never Used  Substance Use Topics  . Alcohol use: No  . Drug use: No     Allergies   Patient has no known allergies.   Review of Systems Review of Systems  Constitutional: Positive for diaphoresis.  Cardiovascular: Positive for chest pain.  All other systems reviewed and are negative.    Physical Exam Updated Vital Signs BP (!) 151/92   Pulse (!) 59   Temp 98.4 F (36.9 C)   Resp (!) 21   Ht 5\' 9"  (1.753 m)   Wt 81.6 kg   SpO2 96%   BMI 26.58 kg/m   Physical Exam Vitals signs and nursing note reviewed.  Constitutional:      General: He is not in acute distress.    Appearance: Normal appearance. He is well-developed.  HENT:     Head: Normocephalic and atraumatic.     Right Ear: Hearing normal.     Left Ear: Hearing normal.     Nose: Nose normal.  Eyes:     Conjunctiva/sclera: Conjunctivae normal.     Pupils: Pupils are equal, round, and reactive to light.  Neck:     Musculoskeletal: Normal range of motion and neck supple.  Cardiovascular:     Rate and Rhythm:  Regular rhythm.     Heart sounds: S1 normal and S2 normal. No murmur. No friction rub. No gallop.   Pulmonary:     Effort: Pulmonary effort is normal. No respiratory distress.     Breath sounds: Normal breath sounds.  Chest:     Chest wall: No tenderness.  Abdominal:     General: Bowel sounds are normal.     Palpations: Abdomen is soft.     Tenderness: There is no abdominal tenderness. There is no guarding or rebound. Negative signs include Murphy's sign  and McBurney's sign.     Hernia: No hernia is present.  Musculoskeletal: Normal range of motion.  Skin:    General: Skin is warm and dry.     Findings: No rash.  Neurological:     Mental Status: He is alert and oriented to person, place, and time.     GCS: GCS eye subscore is 4. GCS verbal subscore is 5. GCS motor subscore is 6.     Cranial Nerves: No cranial nerve deficit.     Sensory: No sensory deficit.     Coordination: Coordination normal.  Psychiatric:        Speech: Speech normal.        Behavior: Behavior normal.        Thought Content: Thought content normal.      ED Treatments / Results  Labs (all labs ordered are listed, but only abnormal results are displayed) Labs Reviewed  BASIC METABOLIC PANEL - Abnormal; Notable for the following components:      Result Value   Potassium 2.9 (*)    Glucose, Bld 138 (*)    Creatinine, Ser 1.31 (*)    GFR calc non Af Amer 50 (*)    GFR calc Af Amer 58 (*)    All other components within normal limits  BRAIN NATRIURETIC PEPTIDE - Abnormal; Notable for the following components:   B Natriuretic Peptide 204.1 (*)    All other components within normal limits  TROPONIN I (HIGH SENSITIVITY) - Abnormal; Notable for the following components:   Troponin I (High Sensitivity) 30 (*)    All other components within normal limits  TROPONIN I (HIGH SENSITIVITY) - Abnormal; Notable for the following components:   Troponin I (High Sensitivity) 1,129 (*)    All other components within  normal limits  TROPONIN I (HIGH SENSITIVITY) - Abnormal; Notable for the following components:   Troponin I (High Sensitivity) 7,719 (*)    All other components within normal limits  SARS CORONAVIRUS 2 BY RT PCR (HOSPITAL ORDER, [place] [facility])  CULTURE, BLOOD (ROUTINE X 2)  CULTURE, BLOOD (ROUTINE X 2)  CBC  LACTIC ACID, PLASMA  PROTIME-INR  APTT  HEPARIN LEVEL (UNFRACTIONATED)    EKG EKG Interpretation  Date/Time:  Tuesday October 18 2018 18:16:25 EDT Ventricular Rate:  65 PR Interval:  208 QRS Duration: 88 QT Interval:  392 QTC Calculation: 407 R Axis:   53 Text Interpretation:  Normal sinus rhythm Nonspecific ST and T wave abnormality Abnormal ECG No significant change since last tracing Confirmed by Orpah Greek [phone]) on 10/18/2018 11:28:38 PM   Radiology Dg Chest 2 View  Result Date: 10/18/2018 CLINICAL DATA:  85 year old male with history of central chest pain for 1 hour. EXAM: CHEST - 2 VIEW COMPARISON:  Chest x-ray 03/05/2016. FINDINGS: Ill-defined opacities are noted throughout the mid to lower lungs bilaterally where there is also mild diffuse interstitial prominence, concerning for developing multilobar pneumonia. Sparing of the upper lungs. No definite pleural effusions. No evidence of pulmonary edema. Heart size is borderline enlarged. Upper mediastinal contours are within normal limits. Aortic atherosclerosis. Implanted electronic device projecting over the left hemithorax. IMPRESSION: 1. The appearance the chest is concerning for developing multilobar pneumonia, most evident throughout the mid to lower lungs. 2. Aortic atherosclerosis. Electronically Signed   By: Vinnie Langton M.D.   On: 10/18/2018 19:23   Ct Angio Chest Pe W Or Wo Contrast  Result Date: 10/19/2018 CLINICAL DATA:  Chest pain EXAM: CT  ANGIOGRAPHY CHEST WITH CONTRAST TECHNIQUE: Multidetector CT imaging of the chest was performed using the standard protocol during  bolus administration of intravenous contrast. Multiplanar CT image reconstructions and MIPs were obtained to evaluate the vascular anatomy. CONTRAST:  155mL OMNIPAQUE IOHEXOL 350 MG/ML SOLN COMPARISON:  Chest x-ray 10/08/2018 FINDINGS: Cardiovascular: Satisfactory opacification of the pulmonary arteries to the segmental level. Motion degradation limits evaluation for distal PE in the lower lobes. No acute central filling defects. Ectatic ascending aorta without aneurysm. Aortic atherosclerosis. Coronary vascular calcification. Borderline cardiomegaly. No pericardial effusion Mediastinum/Nodes: No enlarged mediastinal, hilar, or axillary lymph nodes. Thyroid gland, trachea, and esophagus demonstrate no significant findings. Lungs/Pleura: Lungs are clear. No pleural effusion or pneumothorax. Upper Abdomen: Cysts within the liver. Incompletely visualized 7.5 cm fluid density lesion left upper quadrant probably and exophytic renal cyst. Musculoskeletal: No acute or suspicious osseous abnormality Review of the MIP images confirms the above findings. IMPRESSION: 1. Motion degradation limits evaluation for distal PE in the lower lobes. No acute central embolus is seen. 2. Clear lung fields Aortic Atherosclerosis (ICD10-I70.0). Electronically Signed   By: Donavan Foil M.D.   On: 10/19/2018 03:35    Procedures Procedures (including critical care time)  Medications Ordered in ED Medications  sodium chloride flush (NS) 0.9 % injection 3 mL (has no administration in time range)  nitroGLYCERIN 50 mg in dextrose 5 % 250 mL (0.2 mg/mL) infusion (10 mcg/min Intravenous Rate/Dose Change 10/19/18 0245)  heparin ADULT infusion 100 units/mL (25000 units/290mL sodium chloride 0.45%) (1,000 Units/hr Intravenous New Bag/Given 10/19/18 0150)  heparin bolus via infusion 4,000 Units (4,000 Units Intravenous Bolus from Bag 10/19/18 0151)  iohexol (OMNIPAQUE) 350 MG/ML injection 100 mL (100 mLs Intravenous Contrast Given 10/19/18  0221)     Initial Impression / Assessment and Plan / ED Course  I have reviewed the triage vital signs and the nursing notes.  Pertinent labs & imaging results that were available during my care of the patient were reviewed by me and considered in my medical decision making (see chart for details).        Patient reports sudden onset chest pain after walking his dog tonight.  Pain is substernal and radiates to the jaw and bilateral arms.  EKG does not show obvious ischemia or ST elevations.  Patient was started on chest pain protocol in triage and placed in the waiting room.  First troponin was borderline at 30, second troponin was markedly elevated at 1129.    Patient was brought back to the ED exam room when second troponin resulted.  He is still having pain.  Patient very hypertensive.  Started on heparin and nitroglycerin drip.  He does report a slight cough but does not feel like it was anything more than usual for him.  Chest x-ray read as developing multi lobar pneumonia.  History is most consistent with cardiac etiology of his chest pain, but based on x-ray findings CT angiography performed.  The result of this reveals no evidence of PE and the lung fields are clear.  No pneumonia.  Cardiology consulted to treat patient for NSTEMI.  CRITICAL CARE Performed by: Orpah Greek   Total critical care time: 35 minutes  Critical care time was exclusive of separately billable procedures and treating other patients.  Critical care was necessary to treat or prevent imminent or life-threatening deterioration.  Critical care was time spent personally by me on the following activities: development of treatment plan with patient and/or surrogate as well as nursing, discussions  with consultants, evaluation of patient's response to treatment, examination of patient, obtaining history from patient or surrogate, ordering and performing treatments and interventions, ordering and review of  laboratory studies, ordering and review of radiographic studies, pulse oximetry and re-evaluation of patient's condition.   Final Clinical Impressions(s) / ED Diagnoses   Final diagnoses:  NSTEMI (non-ST elevated myocardial infarction) [facility])    ED Discharge Orders    None       Orpah Greek, MD 10/19/18 [phone]

## 2018-10-18 NOTE — ED Triage Notes (Signed)
Pt in with central cp x 1 hr, radiates to to bilateral arms and jaw. States he went to dinner as normal tonight, came back home and pain began after he walked his dog. Went inside, laid down and pain didn't resolve. Denies any sob or nausea, states pain worse with deep breath

## 2018-10-19 ENCOUNTER — Other Ambulatory Visit: Payer: Self-pay

## 2018-10-19 ENCOUNTER — Emergency Department (HOSPITAL_COMMUNITY): Payer: Medicare Other

## 2018-10-19 ENCOUNTER — Encounter (HOSPITAL_COMMUNITY)
Admission: EM | Disposition: A | Discharge status: Home / Self Care | Payer: Self-pay | Source: Home / Self Care | Attending: Cardiology

## 2018-10-19 ENCOUNTER — Encounter (HOSPITAL_COMMUNITY): Payer: Self-pay | Admitting: General Practice

## 2018-10-19 ENCOUNTER — Inpatient Hospital Stay (HOSPITAL_COMMUNITY): Payer: Medicare Other

## 2018-10-19 DIAGNOSIS — Z87891 Personal history of nicotine dependence: Secondary | ICD-10-CM | POA: Diagnosis not present

## 2018-10-19 DIAGNOSIS — I371 Nonrheumatic pulmonary valve insufficiency: Secondary | ICD-10-CM

## 2018-10-19 DIAGNOSIS — I2510 Atherosclerotic heart disease of native coronary artery without angina pectoris: Secondary | ICD-10-CM

## 2018-10-19 DIAGNOSIS — R079 Chest pain, unspecified: Secondary | ICD-10-CM | POA: Diagnosis not present

## 2018-10-19 DIAGNOSIS — Z23 Encounter for immunization: Secondary | ICD-10-CM | POA: Diagnosis not present

## 2018-10-19 DIAGNOSIS — I480 Paroxysmal atrial fibrillation: Secondary | ICD-10-CM

## 2018-10-19 DIAGNOSIS — Z20828 Contact with and (suspected) exposure to other viral communicable diseases: Secondary | ICD-10-CM | POA: Diagnosis not present

## 2018-10-19 DIAGNOSIS — Z7902 Long term (current) use of antithrombotics/antiplatelets: Secondary | ICD-10-CM | POA: Diagnosis not present

## 2018-10-19 DIAGNOSIS — I214 Non-ST elevation (NSTEMI) myocardial infarction: Secondary | ICD-10-CM | POA: Diagnosis not present

## 2018-10-19 DIAGNOSIS — I361 Nonrheumatic tricuspid (valve) insufficiency: Secondary | ICD-10-CM

## 2018-10-19 DIAGNOSIS — H53462 Homonymous bilateral field defects, left side: Secondary | ICD-10-CM | POA: Diagnosis present

## 2018-10-19 DIAGNOSIS — E785 Hyperlipidemia, unspecified: Secondary | ICD-10-CM | POA: Diagnosis present

## 2018-10-19 DIAGNOSIS — I10 Essential (primary) hypertension: Secondary | ICD-10-CM | POA: Diagnosis not present

## 2018-10-19 DIAGNOSIS — Z96642 Presence of left artificial hip joint: Secondary | ICD-10-CM | POA: Diagnosis not present

## 2018-10-19 DIAGNOSIS — Z79899 Other long term (current) drug therapy: Secondary | ICD-10-CM | POA: Diagnosis not present

## 2018-10-19 DIAGNOSIS — E876 Hypokalemia: Secondary | ICD-10-CM | POA: Diagnosis not present

## 2018-10-19 DIAGNOSIS — I69351 Hemiplegia and hemiparesis following cerebral infarction affecting right dominant side: Secondary | ICD-10-CM | POA: Diagnosis not present

## 2018-10-19 DIAGNOSIS — Z8249 Family history of ischemic heart disease and other diseases of the circulatory system: Secondary | ICD-10-CM | POA: Diagnosis not present

## 2018-10-19 DIAGNOSIS — G4733 Obstructive sleep apnea (adult) (pediatric): Secondary | ICD-10-CM | POA: Diagnosis present

## 2018-10-19 HISTORY — PX: LEFT HEART CATH AND CORONARY ANGIOGRAPHY: CATH118249

## 2018-10-19 HISTORY — PX: CARDIAC CATHETERIZATION: SHX172

## 2018-10-19 LAB — SARS CORONAVIRUS 2 BY RT PCR (HOSPITAL ORDER, PERFORMED IN ~~LOC~~ HOSPITAL LAB): SARS Coronavirus 2: NEGATIVE

## 2018-10-19 LAB — APTT
aPTT: 29 seconds (ref 24–36)
aPTT: 77 seconds — ABNORMAL HIGH (ref 24–36)

## 2018-10-19 LAB — TROPONIN I (HIGH SENSITIVITY): Troponin I (High Sensitivity): 7719 ng/L (ref <18)

## 2018-10-19 LAB — BRAIN NATRIURETIC PEPTIDE: B Natriuretic Peptide: 204.1 pg/mL — ABNORMAL HIGH (ref 0.0–100.0)

## 2018-10-19 LAB — PROTIME-INR
INR: 1.1 (ref 0.8–1.2)
Prothrombin Time: 14.2 seconds (ref 11.4–15.2)

## 2018-10-19 LAB — HEPARIN LEVEL (UNFRACTIONATED): Heparin Unfractionated: 0.36 IU/mL (ref 0.30–0.70)

## 2018-10-19 LAB — BASIC METABOLIC PANEL
Anion gap: 8 (ref 5–15)
BUN: 9 mg/dL (ref 8–23)
CO2: 28 mmol/L (ref 22–32)
Calcium: 8.8 mg/dL — ABNORMAL LOW (ref 8.9–10.3)
Chloride: 101 mmol/L (ref 98–111)
Creatinine, Ser: 1.13 mg/dL (ref 0.61–1.24)
GFR calc Af Amer: >60 mL/min (ref >60)
GFR calc non Af Amer: 59 mL/min — ABNORMAL LOW (ref >60)
Glucose, Bld: 102 mg/dL — ABNORMAL HIGH (ref 70–99)
Potassium: 3.6 mmol/L (ref 3.5–5.1)
Sodium: 137 mmol/L (ref 135–145)

## 2018-10-19 LAB — LACTIC ACID, PLASMA: Lactic Acid, Venous: 1.9 mmol/L (ref 0.5–1.9)

## 2018-10-19 SURGERY — LEFT HEART CATH AND CORONARY ANGIOGRAPHY
Anesthesia: LOCAL

## 2018-10-19 MED ORDER — ROSUVASTATIN CALCIUM 5 MG PO TABS: 10.0000 mg | ORAL_TABLET | Freq: Every evening | ORAL | Status: DC

## 2018-10-19 MED ORDER — ASPIRIN 81 MG PO CHEW: 81.0000 mg | CHEWABLE_TABLET | Freq: Every day | ORAL | Status: DC

## 2018-10-19 MED ORDER — ASPIRIN 325 MG PO TABS
325.0000 mg | ORAL_TABLET | Freq: Once | ORAL | Status: AC
  Administered 2018-10-19: 325 mg via ORAL
  Filled 2018-10-19: qty 1

## 2018-10-19 MED ORDER — IOHEXOL 350 MG/ML SOLN
100.0000 mL | Freq: Once | INTRAVENOUS | Status: AC | PRN
  Administered 2018-10-19: 100 mL via INTRAVENOUS

## 2018-10-19 MED ORDER — HEPARIN (PORCINE) IN NACL 1000-0.9 UT/500ML-% IV SOLN
INTRAVENOUS | Status: AC
  Filled 2018-10-19: qty 1000

## 2018-10-19 MED ORDER — ONDANSETRON HCL 4 MG/2ML IJ SOLN: 4.0000 mg | Freq: Four times a day (QID) | INTRAMUSCULAR | Status: DC | PRN

## 2018-10-19 MED ORDER — IOHEXOL 350 MG/ML SOLN
INTRAVENOUS | Status: DC | PRN
  Administered 2018-10-19: 75 mL via INTRA_ARTERIAL

## 2018-10-19 MED ORDER — PNEUMOCOCCAL VAC POLYVALENT 25 MCG/0.5ML IJ INJ: 0.5000 mL | INJECTION | INTRAMUSCULAR | Status: DC

## 2018-10-19 MED ORDER — HEPARIN SODIUM (PORCINE) 1000 UNIT/ML IJ SOLN
INTRAMUSCULAR | Status: AC
  Filled 2018-10-19: qty 1

## 2018-10-19 MED ORDER — INFLUENZA VAC A&B SA ADJ QUAD 0.5 ML IM PRSY
0.5000 mL | PREFILLED_SYRINGE | INTRAMUSCULAR | Status: DC
  Filled 2018-10-19: qty 0.5

## 2018-10-19 MED ORDER — HEPARIN SODIUM (PORCINE) 1000 UNIT/ML IJ SOLN
INTRAMUSCULAR | Status: DC | PRN
  Administered 2018-10-19: 4000 [IU] via INTRAVENOUS

## 2018-10-19 MED ORDER — SODIUM CHLORIDE 0.9 % IV SOLN: 250.0000 mL | INTRAVENOUS | Status: DC | PRN

## 2018-10-19 MED ORDER — SODIUM CHLORIDE 0.9% FLUSH: 3.0000 mL | INTRAVENOUS | Status: DC | PRN

## 2018-10-19 MED ORDER — FENTANYL CITRATE (PF) 100 MCG/2ML IJ SOLN
INTRAMUSCULAR | Status: DC | PRN
  Administered 2018-10-19: 25 ug via INTRAVENOUS

## 2018-10-19 MED ORDER — APIXABAN 5 MG PO TABS
5.0000 mg | ORAL_TABLET | Freq: Two times a day (BID) | ORAL | Status: DC
  Administered 2018-10-19 – 2018-10-20 (×2): 5 mg via ORAL
  Filled 2018-10-19 (×2): qty 1

## 2018-10-19 MED ORDER — ROSUVASTATIN CALCIUM 20 MG PO TABS
20.0000 mg | ORAL_TABLET | Freq: Every evening | ORAL | Status: DC
  Administered 2018-10-19: 20 mg via ORAL
  Filled 2018-10-19: qty 1

## 2018-10-19 MED ORDER — MIDAZOLAM HCL 2 MG/2ML IJ SOLN
INTRAMUSCULAR | Status: AC
  Filled 2018-10-19: qty 2

## 2018-10-19 MED ORDER — NITROGLYCERIN 0.4 MG SL SUBL: 0.4000 mg | SUBLINGUAL_TABLET | SUBLINGUAL | Status: DC | PRN

## 2018-10-19 MED ORDER — SODIUM CHLORIDE 0.9 % IV SOLN
INTRAVENOUS | Status: AC
  Administered 2018-10-19: 12:00:00 via INTRAVENOUS

## 2018-10-19 MED ORDER — SODIUM CHLORIDE 0.9 % WEIGHT BASED INFUSION: 1.0000 mL/kg/h | INTRAVENOUS | Status: DC

## 2018-10-19 MED ORDER — HEPARIN (PORCINE) IN NACL 1000-0.9 UT/500ML-% IV SOLN
INTRAVENOUS | Status: DC | PRN
  Administered 2018-10-19: 500 mL

## 2018-10-19 MED ORDER — MIDAZOLAM HCL 2 MG/2ML IJ SOLN
INTRAMUSCULAR | Status: DC | PRN
  Administered 2018-10-19: 1 mg via INTRAVENOUS

## 2018-10-19 MED ORDER — FENTANYL CITRATE (PF) 100 MCG/2ML IJ SOLN
INTRAMUSCULAR | Status: AC
  Filled 2018-10-19: qty 2

## 2018-10-19 MED ORDER — ACETAMINOPHEN 325 MG PO TABS: 650.0000 mg | ORAL_TABLET | ORAL | Status: DC | PRN

## 2018-10-19 MED ORDER — LIDOCAINE HCL (PF) 1 % IJ SOLN
INTRAMUSCULAR | Status: DC | PRN
  Administered 2018-10-19: 2 mL

## 2018-10-19 MED ORDER — VERAPAMIL HCL 2.5 MG/ML IV SOLN
INTRAVENOUS | Status: AC
  Filled 2018-10-19: qty 2

## 2018-10-19 MED ORDER — METOPROLOL TARTRATE 25 MG PO TABS
25.0000 mg | ORAL_TABLET | Freq: Two times a day (BID) | ORAL | Status: DC
  Administered 2018-10-19 – 2018-10-20 (×3): 25 mg via ORAL
  Filled 2018-10-19 (×3): qty 1

## 2018-10-19 MED ORDER — SODIUM CHLORIDE 0.9 % WEIGHT BASED INFUSION
3.0000 mL/kg/h | INTRAVENOUS | Status: DC
  Administered 2018-10-19: 3 mL/kg/h via INTRAVENOUS

## 2018-10-19 MED ORDER — SODIUM CHLORIDE 0.9% FLUSH
3.0000 mL | Freq: Two times a day (BID) | INTRAVENOUS | Status: DC
  Administered 2018-10-19 (×2): 3 mL via INTRAVENOUS

## 2018-10-19 MED ORDER — VERAPAMIL HCL 2.5 MG/ML IV SOLN
INTRAVENOUS | Status: DC | PRN
  Administered 2018-10-19: 10 mL via INTRA_ARTERIAL

## 2018-10-19 MED ORDER — LIDOCAINE HCL (PF) 1 % IJ SOLN
INTRAMUSCULAR | Status: AC
  Filled 2018-10-19: qty 30

## 2018-10-19 MED ORDER — HYDRALAZINE HCL 20 MG/ML IJ SOLN: 10.0000 mg | INTRAMUSCULAR | Status: AC | PRN

## 2018-10-19 SURGICAL SUPPLY — 13 items
CATH INFINITI 5 FR JL3.5 (CATHETERS) ×2 IMPLANT
CATH INFINITI 5FR JK (CATHETERS) ×2 IMPLANT
CATH INFINITI JR4 5F (CATHETERS) ×2 IMPLANT
CATH LAUNCHER 6FR EBU3.5 (CATHETERS) ×2 IMPLANT
DEVICE RAD COMP TR BAND LRG (VASCULAR PRODUCTS) ×2 IMPLANT
GLIDESHEATH SLEND SS 6F .021 (SHEATH) ×2 IMPLANT
GUIDEWIRE INQWIRE 1.5J.035X260 (WIRE) ×1 IMPLANT
INQWIRE 1.5J .035X260CM (WIRE) ×2
KIT HEART LEFT (KITS) ×2 IMPLANT
PACK CARDIAC CATHETERIZATION (CUSTOM PROCEDURE TRAY) ×2 IMPLANT
TRANSDUCER W/STOPCOCK (MISCELLANEOUS) ×2 IMPLANT
TUBING CIL FLEX 10 FLL-RA (TUBING) ×2 IMPLANT
WIRE HI TORQ VERSACORE-J 145CM (WIRE) ×2 IMPLANT

## 2018-10-19 NOTE — H&P (Signed)
Cardiology Admission History and Physical:   Patient ID: Brian Brown; MRN: [id number]; DOB: October 16, 1932   Admission date: 10/18/2018  Primary Care Provider:  Seward Carol, MD Primary Cardiologist:  Will Meredith Leeds, MD        Chief Complaint:  Chest pain  History of Present Illness:   Brian Brown is a 85 y.o. male with a history of recent CVA, obstructive sleep apnea (not on CPAP), paroxsymal atrial fibrillation (confirmed with the LINQ device), hypertension and hyperlipidemia who presents to the hospital with complaints of chest pain. The patient reported acute onset of substernal chest pain after dinner when he returned home after walking his dog. He had some associated diaphoresis. The pain radiated to the jaw and both arms. Due to an elevated blood pressure in the ED he was started on an IV nitroglycerin infusion.  The patient had a stroke earlier this year (February 2020) with complaints of left hemianopsia. Imaging revealed a right PCA infarct consistent with an embolic pattern. He underwent placement of a LINQ loop monitor implant on 09/16/2018. The monitor documented true atrial fibrillation with an episode of 10 hr 26 mins duration. Therefore on 10/13/2018 during a clinic visit with Dr. Curt Bears it was suggested that he stop Plavix and start taking Eliquis. The patient unfortunately does not clearly recall the medication he is on currently.  In the ED his BP was 173/101 mmHg with a heart rate of 89 bpm. The ECG documented sinus rhythm with T wave changes in the inferolateral leads.The high sensitivity troponins were 30, 1129 and 7719. The CXR suggested ill-defined opacities noted throughout the mid to lower lungs bilaterally. A CT Angio of the chest however showed clear lung fields and did not reveal a pulmonary embolus.    Past Medical History:  Diagnosis Date  . Arthritis    degenerative joint disease  . Bleeding stomach ulcer 25 yrs ago  . Cancer [facility]) 2002   prostate, s/p surgery  . Hepatitis 1981   does not know what kind;denies jaundice  . Hypertension    treated by Dr Delfina Redwood, primary MD  . Sleep apnea 2005   does not wear a Cpap  . Stroke ([place]) 2003   w/ Rt sided weakness-some-rt leg  . Wears dentures    full top  . Wears glasses     Past Surgical History:  Procedure Laterality Date  . BUBBLE STUDY  09/16/2018   Procedure: BUBBLE STUDY;  Surgeon: Lelon Perla, MD;  Location: [place];  Service: Cardiovascular;;  . CARPAL TUNNEL RELEASE Left 03/29/2013   Procedure: LEFT CARPAL TUNNEL RELEASE;  Surgeon: Wynonia Sours, MD;  Location: [place];  Service: Orthopedics;  Laterality: Left;  ANESTHESIA: IV REGIONAL FAB  . CARPAL TUNNEL RELEASE Right 03/06/2014   Procedure: RIGHT CARPAL TUNNEL RELEASE;  Surgeon: Daryll Brod, MD;  Location: [place];  Service: Orthopedics;  Laterality: Right;  . COLONOSCOPY    . HERNIA REPAIR  2009   rt inguinal hernia  . JOINT REPLACEMENT  12/2001   rt hip ;[place] N/A 09/16/2018   Procedure: LOOP RECORDER INSERTION;  Surgeon: Constance Haw, MD;  Location: [place] [facility];  Service: Cardiovascular;  Laterality: N/A;  . PROSTATECTOMY  2002  . SHOULDER ARTHROSCOPY W/ ROTATOR CUFF REPAIR  2014   lt-dr [place]field  . TEE WITHOUT CARDIOVERSION N/A 09/16/2018   Procedure: TRANSESOPHAGEAL ECHOCARDIOGRAM (TEE);  Surgeon: Lelon Perla, MD;  Location:  [place] [facility];  Service: Cardiovascular;  Laterality: N/A;  . TOTAL HIP ARTHROPLASTY  02/03/2011   Procedure: TOTAL HIP ARTHROPLASTY;  Surgeon: Garald Balding, MD;  Location: [place];  Service: Orthopedics;  Laterality: Left;  . TRIGGER FINGER RELEASE Left 03/29/2013   Procedure: LEFT MIDDLE/SMALL FINGER RELEASE A-1 PULLEY AND LEFT RING FINGER;  Surgeon: Wynonia Sours, MD;  Location: [place];  Service: Orthopedics;  Laterality: Left;  . TRIGGER FINGER RELEASE Right  03/06/2014   Procedure: RIGHT MIDDLE FINGER,RIGHT RINGER FINGER;  Surgeon: Daryll Brod, MD;  Location: [place];  Service: Orthopedics;  Laterality: Right;     Medications Prior to Admission: Prior to Admission medications   Medication Sig Start Date End Date Taking? Authorizing Provider  clopidogrel (PLAVIX) 75 MG tablet Take 75 mg by mouth daily with breakfast.   Yes [provider]  diltiazem (DILACOR XR) 240 MG 24 hr capsule Take 240 mg by mouth daily.   Yes [provider]  furosemide (LASIX) 20 MG tablet Take 20 mg by mouth daily as needed for fluid.    Yes [provider]  metoprolol tartrate (LOPRESSOR) 25 MG tablet Take 25 mg by mouth 2 (two) times daily.   Yes [provider]  Omega 3-6-9 Fatty Acids (OMEGA 3-6-9 COMPLEX PO) Take 2-3 capsules by mouth See admin instructions. 3 caps in the morning, and 2 caps in the evening   Yes [provider]  rosuvastatin (CRESTOR) 10 MG tablet Take 10 mg by mouth every evening. 12/18/17  Yes [provider]     Allergies:   No Known Allergies  Social History:   Social History   Socioeconomic History  . Marital status: Widowed    Spouse name: Not on file  . Number of children: Not on file  . Years of education: Not on file  . Highest education level: Not on file  Occupational History  . Not on file  Social Needs  . Financial resource strain: Not on file  . Food insecurity    Worry: Not on file    Inability: Not on file  . Transportation needs    Medical: Not on file    Non-medical: Not on file  Tobacco Use  . Smoking status: Former Smoker    Packs/day: 1.00    Years: 30.00    Pack years: 30.00    Types: Cigarettes    Quit date: 01/27/2008    Years since quitting: 10.7  . Smokeless tobacco: Never Used  Substance and Sexual Activity  . Alcohol use: No  . Drug use: No  . Sexual activity: Not on file  Lifestyle  . Physical activity    Days per week: Not on  file    Minutes per session: Not on file  . Stress: Not on file  Relationships  . Social Herbalist on phone: Not on file    Gets together: Not on file    Attends religious service: Not on file    Active member of club or organization: Not on file    Attends meetings of clubs or organizations: Not on file    Relationship status: Not on file  . Intimate partner violence    Fear of current or ex partner: Not on file    Emotionally abused: Not on file    Physically abused: Not on file    Forced sexual activity: Not on file  Other Topics Concern  . Not on file  Social History Narrative  . Not on file     Family History:  The patient's family history includes Heart disease in his father and mother.     Review of Systems: [y] = yes, [ ]  = no   . General: Weight gain [ ] ; Weight loss [ ] ; Anorexia [ ] ; Fatigue [ ] ; Fever [ ] ; Chills [ ] ; Weakness [ ]   . Cardiac: Chest pain/pressure [Y ]; Resting SOB [Y ]; Exertional SOB [ ] ; Orthopnea [ ] ; Pedal Edema [ ] ; Palpitations [ ] ; Syncope [ ] ; Presyncope [ ] ; Paroxysmal nocturnal dyspnea[ ]   . Pulmonary: Cough Jazmín.Cullens ]; Wheezing[ ] ; Hemoptysis[ ] ; Sputum [ ] ; Snoring [ ]   . GI: Vomiting[ ] ; Dysphagia[ ] ; Melena[ ] ; Hematochezia [ ] ; Heartburn[ ] ; Abdominal pain [ ] ; Constipation [ ] ; Diarrhea [ ] ; BRBPR [ ]   . GU: Hematuria[ ] ; Dysuria [ ] ; Nocturia[ ]   . Vascular: Pain in legs with walking [ ] ; Pain in feet with lying flat [ ] ; Non-healing sores [ ] ; Stroke [ ] ; TIA [ ] ; Slurred speech [ ] ;  . Neuro: Headaches[ ] ; Vertigo[ ] ; Seizures[ ] ; Paresthesias[ ] ;Blurred vision [ ] ; Diplopia [ ] ; Vision changes [ ]   . Ortho/Skin: Arthritis [ ] ; Joint pain [ ] ; Muscle pain [ ] ; Joint swelling [ ] ; Back Pain [ ] ; Rash [ ]   . Psych: Depression[ ] ; Anxiety[ ]   . Heme: Bleeding problems [ ] ; Clotting disorders [ ] ; Anemia [ ]   . Endocrine: Diabetes [ ] ; Thyroid dysfunction[ ]      Physical Exam/Data:   Vitals:   10/19/18 0430 10/19/18 0445  10/19/18 0500 10/19/18 0515  BP: (!) 151/92 (!) 147/91 (!) 150/101 (!) 144/95  Pulse:      Resp: (!) 21 (!) 9 (!) 22 16  Temp:      SpO2:      Weight:      Height:       No intake or output data in the 24 hours ending 10/19/18 0546 Filed Weights   10/18/18 1816 10/19/18 0000  Weight: 84.6 kg 81.6 kg   Body mass index is 26.58 kg/m.  General:  Well nourished, well developed, in no acute distress HEENT: normal Lymph: no adenopathy Neck: no JVD Endocrine:  No thryomegaly Vascular: No carotid bruits; FA pulses 2+ bilaterally without bruits  Cardiac:  normal S1, S2; RRR; no murmur  Lungs:  clear to auscultation bilaterally, no wheezing, rhonchi or rales  Abd: soft, nontender, no hepatomegaly  Ext: no edema Musculoskeletal:  No deformities, BUE and BLE strength normal and equal Skin: warm and dry  Neuro:  CNs 2-12 intact, no focal abnormalities noted Psych:  Normal affect    Laboratory Data:  Chemistry Recent Labs  Lab 10/13/18 1043 10/18/18 1818  NA 138 138  K 3.8 2.9*  CL 99 101  CO2 25 24  GLUCOSE 88 138*  BUN 13 17  CREATININE 1.11 1.31*  CALCIUM 9.2 9.1  GFRNONAA 61 50*  GFRAA 70 58*  ANIONGAP  --  13    No results for input(s): PROT, ALBUMIN, AST, ALT, ALKPHOS, BILITOT in the last 168 hours. Hematology Recent Labs  Lab 10/13/18 1043 10/18/18 1818  WBC 6.5 8.5  RBC 4.74 4.94  HGB 13.7 14.8  HCT 39.5 44.0  MCV 83 89.1  MCH 28.9 30.0  MCHC 34.7 33.6  RDW 12.6 13.6  PLT 233 219   Cardiac EnzymesNo results for input(s): TROPONINI in the last 168 hours. No  results for input(s): TROPIPOC in the last 168 hours.  BNP Recent Labs  Lab 10/18/18 2355  BNP 204.1*    DDimer No results for input(s): DDIMER in the last 168 hours.  Radiology/Studies:  Dg Chest 2 View  Result Date: 10/18/2018 CLINICAL DATA:  85 year old male with history of central chest pain for 1 hour. EXAM: CHEST - 2 VIEW COMPARISON:  Chest x-ray 03/05/2016. FINDINGS: Ill-defined  opacities are noted throughout the mid to lower lungs bilaterally where there is also mild diffuse interstitial prominence, concerning for developing multilobar pneumonia. Sparing of the upper lungs. No definite pleural effusions. No evidence of pulmonary edema. Heart size is borderline enlarged. Upper mediastinal contours are within normal limits. Aortic atherosclerosis. Implanted electronic device projecting over the left hemithorax. IMPRESSION: 1. The appearance the chest is concerning for developing multilobar pneumonia, most evident throughout the mid to lower lungs. 2. Aortic atherosclerosis. Electronically Signed   By: Vinnie Langton M.D.   On: 10/18/2018 19:23   Ct Angio Chest Pe W Or Wo Contrast  Result Date: 10/19/2018 CLINICAL DATA:  Chest pain EXAM: CT ANGIOGRAPHY CHEST WITH CONTRAST TECHNIQUE: Multidetector CT imaging of the chest was performed using the standard protocol during bolus administration of intravenous contrast. Multiplanar CT image reconstructions and MIPs were obtained to evaluate the vascular anatomy. CONTRAST:  131mL OMNIPAQUE IOHEXOL 350 MG/ML SOLN COMPARISON:  Chest x-ray 10/08/2018 FINDINGS: Cardiovascular: Satisfactory opacification of the pulmonary arteries to the segmental level. Motion degradation limits evaluation for distal PE in the lower lobes. No acute central filling defects. Ectatic ascending aorta without aneurysm. Aortic atherosclerosis. Coronary vascular calcification. Borderline cardiomegaly. No pericardial effusion Mediastinum/Nodes: No enlarged mediastinal, hilar, or axillary lymph nodes. Thyroid gland, trachea, and esophagus demonstrate no significant findings. Lungs/Pleura: Lungs are clear. No pleural effusion or pneumothorax. Upper Abdomen: Cysts within the liver. Incompletely visualized 7.5 cm fluid density lesion left upper quadrant probably and exophytic renal cyst. Musculoskeletal: No acute or suspicious osseous abnormality Review of the MIP images  confirms the above findings. IMPRESSION: 1. Motion degradation limits evaluation for distal PE in the lower lobes. No acute central embolus is seen. 2. Clear lung fields Aortic Atherosclerosis (ICD10-I70.0). Electronically Signed   By: Donavan Foil M.D.   On: 10/19/2018 03:35    Assessment and Plan:   1.  Non ST elevation myocardial infarction  -Admit to telemetry -Trend cardiac biomarkers -Serial ECGs -ASA 81 mg daily -Nitroglycerin as needed for chest pain -IV unfractionated heparin -Continue Rosuvastatin -Continue metoprolol -Make the patient NPO for possible cardiac catheterization later today.   2. Paroxysmal atrial fibrillation documented by the LINQ device This patients CHA2DS2-VASc Score and unadjusted Ischemic Stroke Rate (% per year) is equal to 7.2 % stroke rate/year from a score of 5.  -Continue IV Heparin -Resume Eliquis after his cardiac catheterization    Severity of Illness: The appropriate patient status for this patient is INPATIENT. Inpatient status is judged to be reasonable and necessary in order to provide the required intensity of service to ensure the patient's safety. The patient's presenting symptoms, physical exam findings, and initial radiographic and laboratory data in the context of their chronic comorbidities is felt to place them at high risk for further clinical deterioration. Furthermore, it is not anticipated that the patient will be medically stable for discharge from the hospital within 2 midnights of admission. The following factors support the patient status of inpatient.   " The patient's presenting symptoms include chest pain. " The physical exam findings include normal  auscultation. " The initial radiographic and laboratory data are worrisome because of elevated troponin. " The chronic co-morbidities include CVA, HTN and hyperlipidemia.   * I certify that at the point of admission it is my clinical judgment that the patient will require  inpatient hospital care spanning beyond 2 midnights from the point of admission due to high intensity of service, high risk for further deterioration and high frequency of surveillance required.*    For questions or updates, please contact [place] Please consult [email or web address] for contact info under Cardiology/STEMI.    Signed, Meade Maw, MD  10/19/2018 5:46 AM

## 2018-10-19 NOTE — Progress Notes (Addendum)
Progress Note  Patient Name: Brian Brown Date of Encounter: 10/19/2018  Primary Cardiologist: No primary care provider on file.   Subjective   No chest pain this morning. Remains on IV nitro and heparin.  Inpatient Medications    Scheduled Meds: . metoprolol tartrate  25 mg Oral BID  . rosuvastatin  10 mg Oral QPM  . sodium chloride flush  3 mL Intravenous Once   Continuous Infusions: . heparin 1,000 Units/hr (10/19/18 0150)  . nitroGLYCERIN 10 mcg/min (10/19/18 0245)   PRN Meds: acetaminophen, nitroGLYCERIN, ondansetron (ZOFRAN) IV   Vital Signs    Vitals:   10/19/18 0700 10/19/18 0715 10/19/18 0745 10/19/18 0800  BP: (!) 149/91 (!) 149/94 (!) 150/91 (!) 141/96  Pulse:    (!) 58  Resp: 20 10 18 17   Temp:      SpO2:    97%  Weight:      Height:       No intake or output data in the 24 hours ending 10/19/18 0823 Last 3 Weights 10/19/2018 10/18/2018 10/13/2018  Weight (lbs) 180 lb 186 lb 8.2 oz 186 lb 6.4 oz  Weight (kg) 81.647 kg 84.6 kg 84.55 kg      Telemetry    SB - Personally Reviewed  ECG    SB with known TWI in lead III - Personally Reviewed  Physical Exam  Pleasant older AAM, sitting up in bed. GEN: No acute distress.   Neck: No JVD Cardiac: RRR, no murmurs, rubs, or gallops.  Respiratory: Clear to auscultation bilaterally. GI: Soft, nontender, non-distended  MS: No edema; No deformity. Neuro:  Nonfocal  Psych: Normal affect   Labs    High Sensitivity Troponin:   Recent Labs  Lab 10/18/18 1818 10/18/18 2146 10/18/18 2355  TROPONINIHS 30* 1,129* 7,719*      Chemistry Recent Labs  Lab 10/13/18 1043 10/18/18 1818  NA 138 138  K 3.8 2.9*  CL 99 101  CO2 25 24  GLUCOSE 88 138*  BUN 13 17  CREATININE 1.11 1.31*  CALCIUM 9.2 9.1  GFRNONAA 61 50*  GFRAA 70 58*  ANIONGAP  --  13     Hematology Recent Labs  Lab 10/13/18 1043 10/18/18 1818  WBC 6.5 8.5  RBC 4.74 4.94  HGB 13.7 14.8  HCT 39.5 44.0  MCV 83 89.1  MCH  28.9 30.0  MCHC 34.7 33.6  RDW 12.6 13.6  PLT 233 219    BNP Recent Labs  Lab 10/18/18 2355  BNP 204.1*     DDimer No results for input(s): DDIMER in the last 168 hours.   Radiology    Dg Chest 2 View  Result Date: 10/18/2018 CLINICAL DATA:  85 year old male with history of central chest pain for 1 hour. EXAM: CHEST - 2 VIEW COMPARISON:  Chest x-ray 03/05/2016. FINDINGS: Ill-defined opacities are noted throughout the mid to lower lungs bilaterally where there is also mild diffuse interstitial prominence, concerning for developing multilobar pneumonia. Sparing of the upper lungs. No definite pleural effusions. No evidence of pulmonary edema. Heart size is borderline enlarged. Upper mediastinal contours are within normal limits. Aortic atherosclerosis. Implanted electronic device projecting over the left hemithorax. IMPRESSION: 1. The appearance the chest is concerning for developing multilobar pneumonia, most evident throughout the mid to lower lungs. 2. Aortic atherosclerosis. Electronically Signed   By: Vinnie Langton M.D.   On: 10/18/2018 19:23   Ct Angio Chest Pe W Or Wo Contrast  Result Date: 10/19/2018 CLINICAL DATA:  Chest pain EXAM: CT ANGIOGRAPHY CHEST WITH CONTRAST TECHNIQUE: Multidetector CT imaging of the chest was performed using the standard protocol during bolus administration of intravenous contrast. Multiplanar CT image reconstructions and MIPs were obtained to evaluate the vascular anatomy. CONTRAST:  163mL OMNIPAQUE IOHEXOL 350 MG/ML SOLN COMPARISON:  Chest x-ray 10/08/2018 FINDINGS: Cardiovascular: Satisfactory opacification of the pulmonary arteries to the segmental level. Motion degradation limits evaluation for distal PE in the lower lobes. No acute central filling defects. Ectatic ascending aorta without aneurysm. Aortic atherosclerosis. Coronary vascular calcification. Borderline cardiomegaly. No pericardial effusion Mediastinum/Nodes: No enlarged mediastinal,  hilar, or axillary lymph nodes. Thyroid gland, trachea, and esophagus demonstrate no significant findings. Lungs/Pleura: Lungs are clear. No pleural effusion or pneumothorax. Upper Abdomen: Cysts within the liver. Incompletely visualized 7.5 cm fluid density lesion left upper quadrant probably and exophytic renal cyst. Musculoskeletal: No acute or suspicious osseous abnormality Review of the MIP images confirms the above findings. IMPRESSION: 1. Motion degradation limits evaluation for distal PE in the lower lobes. No acute central embolus is seen. 2. Clear lung fields Aortic Atherosclerosis (ICD10-I70.0). Electronically Signed   By: Donavan Foil M.D.   On: 10/19/2018 03:35    Cardiac Studies   N/a   Patient Profile     85 y.o. male with a history of recent CVA, obstructive sleep apnea (not on CPAP), paroxsymal atrial fibrillation (confirmed with the LINQ device), hypertension and hyperlipidemia who presented to the hospital with complaints of chest pain.   Assessment & Plan    1. NSTEMI: developed substernal chest pain after walking his dog last evening. Radiation into left arm and jaw. HsT peaked at 7719. EKG without acute ischemia. Remains on IV nitro and heparin currently. Given symptoms and troponin, will need further evaluation with coronary angiography.  -- The patient understands that risks included but are not limited to stroke (1 in 1000), death (1 in 51), kidney failure [usually temporary] (1 in 500), bleeding (1 in 200), allergic reaction [possibly serious] (1 in 200).  -- on ASA, statin, and BB  2. PAF: Reports last dose of Eliquis was yesterday morning around 8am. Currently held, on IV heparin.   3. HTN: Blood pressures are elevated in the ED. Due for metoprolol. On IV nitro. Will further adjust as needed.   4. HL: on Crestor 10mg  daily. Will further increase to 20mg  daily.   5. Hypokalemia: K+ 2.9 yesterday. Repeat pending this morning.   For questions or updates, please  contact [place] Please consult [email or web address] for contact info under        Signed, Reino Bellis, NP  10/19/2018, 8:23 AM    I have examined the patient and reviewed assessment and plan and discussed with patient.  Agree with above as stated.    I saw him in the ER and he was pain free.  Cardiac catheterization was discussed with the patient fully. The patient understands that risks include but are not limited to stroke (1 in 1000), death (1 in 46), kidney failure [usually temporary] (1 in 500), bleeding (1 in 200), allergic reaction [possibly serious] (1 in 200).  The patient understands and is willing to proceed.    I reviewed his films while he was in the cath lab.  He has a severe thrombhotic lesion in a distal OM.  ? Embolic event.  WIll have to make sure that he has been taking ELiquis as prescribed.  If he has, will have to consdier adding low dose aspirin to ELiquis.  Larae Grooms

## 2018-10-19 NOTE — Progress Notes (Signed)
ANTICOAGULATION CONSULT NOTE   Pharmacy Consult for Heparin Indication: chest pain/ACS  No Known Allergies  Patient Measurements: Height: 5\' 9"  (175.3 cm) Weight: 180 lb (81.6 kg) IBW/kg (Calculated) : 70.7  Vital Signs: BP: 143/91 (10/14 0815) Pulse Rate: 58 (10/14 0815)  Labs: Recent Labs    10/18/18 1818 10/18/18 2146 10/18/18 2355 10/19/18 0130 10/19/18 0822  HGB 14.8  --   --   --   --   HCT 44.0  --   --   --   --   PLT 219  --   --   --   --   APTT  --   --   --  29 77*  LABPROT  --   --   --  14.2  --   INR  --   --   --  1.1  --   HEPARINUNFRC  --   --   --   --  0.36  CREATININE 1.31*  --   --   --  1.13  TROPONINIHS 30* 1,129* 7,719*  --   --     Estimated Creatinine Clearance: 48.7 mL/min (by C-G formula based on SCr of 1.13 mg/dL).  Assessment: 85 y.o. male with chest pain continues on IV heparin. Initial heparin level is therapeutic. No bleeding noted. Planning cardiac cath later today.   Goal of Therapy:  Heparin level 0.3-0.7 units/ml Monitor platelets by anticoagulation protocol: Yes   Plan:  Continue heparin gtt 1000 units/hr Daily heparin level and CBC F/u post-cath  Salome Arnt, PharmD, BCPS Please see AMION for all pharmacy numbers 10/19/2018 9:42 AM

## 2018-10-19 NOTE — Interval H&P Note (Signed)
Cath Lab Visit (complete for each Cath Lab visit)  Clinical Evaluation Leading to the Procedure:   ACS: Yes.    Non-ACS:  n/a   History and Physical Interval Note:  10/19/2018 10:18 AM  Brian Brown  has presented today for surgery, with the diagnosis of chest pain.  The various methods of treatment have been discussed with the patient and family. After consideration of risks, benefits and other options for treatment, the patient has consented to  Procedure(s): LEFT HEART CATH AND CORONARY ANGIOGRAPHY (N/A) as a surgical intervention.  The patient's history has been reviewed, patient examined, no change in status, stable for surgery.  I have reviewed the patient's chart and labs.  Questions were answered to the patient's satisfaction.     Kathlyn [place]

## 2018-10-19 NOTE — Progress Notes (Signed)
  Echocardiogram 2D Echocardiogram has been performed.  Brian Brown 10/19/2018, 2:08 PM

## 2018-10-20 ENCOUNTER — Encounter (HOSPITAL_COMMUNITY): Payer: Self-pay | Admitting: Cardiology

## 2018-10-20 DIAGNOSIS — I480 Paroxysmal atrial fibrillation: Secondary | ICD-10-CM

## 2018-10-20 DIAGNOSIS — I10 Essential (primary) hypertension: Secondary | ICD-10-CM

## 2018-10-20 LAB — ECHOCARDIOGRAM COMPLETE
Height: 69 in
Weight: 2880 oz

## 2018-10-20 LAB — BASIC METABOLIC PANEL
Anion gap: 11 (ref 5–15)
BUN: 10 mg/dL (ref 8–23)
CO2: 23 mmol/L (ref 22–32)
Calcium: 8.9 mg/dL (ref 8.9–10.3)
Chloride: 103 mmol/L (ref 98–111)
Creatinine, Ser: 1.14 mg/dL (ref 0.61–1.24)
GFR calc Af Amer: >60 mL/min (ref >60)
GFR calc non Af Amer: 59 mL/min — ABNORMAL LOW (ref >60)
Glucose, Bld: 111 mg/dL — ABNORMAL HIGH (ref 70–99)
Potassium: 3.7 mmol/L (ref 3.5–5.1)
Sodium: 137 mmol/L (ref 135–145)

## 2018-10-20 LAB — CBC
HCT: 43.6 % (ref 39.0–52.0)
Hemoglobin: 14.9 g/dL (ref 13.0–17.0)
MCH: 29.7 pg (ref 26.0–34.0)
MCHC: 34.2 g/dL (ref 30.0–36.0)
MCV: 86.9 fL (ref 80.0–100.0)
Platelets: 188 10*3/uL (ref 150–400)
RBC: 5.02 MIL/uL (ref 4.22–5.81)
RDW: 13.6 % (ref 11.5–15.5)
WBC: 12.1 10*3/uL — ABNORMAL HIGH (ref 4.0–10.5)
nRBC: 0 % (ref 0.0–0.2)

## 2018-10-20 MED ORDER — CLOPIDOGREL BISULFATE 75 MG PO TABS
75.0000 mg | ORAL_TABLET | Freq: Every day | ORAL | Status: DC
  Administered 2018-10-20: 75 mg via ORAL
  Filled 2018-10-20: qty 1

## 2018-10-20 MED ORDER — LISINOPRIL 10 MG PO TABS: 10.0000 mg | ORAL_TABLET | Freq: Every day | ORAL | 11 refills | Status: DC

## 2018-10-20 MED ORDER — ROSUVASTATIN CALCIUM 20 MG PO TABS: 20.0000 mg | ORAL_TABLET | Freq: Every evening | ORAL | 6 refills | Status: DC

## 2018-10-20 MED ORDER — ISOSORBIDE MONONITRATE ER 30 MG PO TB24
30.0000 mg | ORAL_TABLET | Freq: Every day | ORAL | Status: DC
  Administered 2018-10-20: 10:00:00 30 mg via ORAL
  Filled 2018-10-20: qty 1

## 2018-10-20 MED ORDER — NITROGLYCERIN 0.4 MG SL SUBL: 0.4000 mg | SUBLINGUAL_TABLET | SUBLINGUAL | 0 refills | Status: DC | PRN

## 2018-10-20 MED ORDER — APIXABAN 5 MG PO TABS: 5.0000 mg | ORAL_TABLET | Freq: Two times a day (BID) | ORAL | 6 refills | Status: DC

## 2018-10-20 MED ORDER — ISOSORBIDE MONONITRATE ER 30 MG PO TB24: 30.0000 mg | ORAL_TABLET | Freq: Every day | ORAL | 3 refills | Status: DC

## 2018-10-20 MED ORDER — ATORVASTATIN CALCIUM 40 MG PO TABS: 40.0000 mg | ORAL_TABLET | Freq: Every day | ORAL | 11 refills | Status: DC

## 2018-10-20 MED FILL — ELIQUIS 5 MG TABLET: 5 | 30 days supply | Qty: 60 | Fill #0

## 2018-10-20 MED FILL — ATORVASTATIN CALCIUM 40 MG: 40 | 30 days supply | Qty: 30 | Fill #0

## 2018-10-20 MED FILL — ISOSORBIDE MN ER 30 MG TAB: 30 | 30 days supply | Qty: 30 | Fill #0

## 2018-10-20 MED FILL — NITROGLYCERIN 0.4 MG TAB SL: 0.4 | 7 days supply | Qty: 25 | Fill #0

## 2018-10-20 MED FILL — LISINOPRIL 10 MG TABS: 10 | 30 days supply | Qty: 30 | Fill #0

## 2018-10-20 NOTE — Plan of Care (Signed)

## 2018-10-20 NOTE — Progress Notes (Addendum)
Progress Note  Patient Name: Brian Brown Date of Encounter: 10/20/2018  Primary Cardiologist: No primary care provider on file.   Subjective   Feeling well this morning. No chest pain.   Inpatient Medications    Scheduled Meds: . apixaban  5 mg Oral BID  . influenza vaccine adjuvanted  0.5 mL Intramuscular Tomorrow-1000  . metoprolol tartrate  25 mg Oral BID  . pneumococcal 23 valent vaccine  0.5 mL Intramuscular Tomorrow-1000  . rosuvastatin  20 mg Oral QPM  . sodium chloride flush  3 mL Intravenous Once  . sodium chloride flush  3 mL Intravenous Q12H   Continuous Infusions: . sodium chloride    . nitroGLYCERIN Stopped (10/19/18 1110)   PRN Meds: sodium chloride, acetaminophen, nitroGLYCERIN, ondansetron (ZOFRAN) IV, sodium chloride flush   Vital Signs    Vitals:   10/19/18 2048 10/19/18 2213 10/20/18 0500 10/20/18 0507  BP: (!) 148/96 134/89  (!) 149/97  Pulse: 65 76  73  Resp: 16   15  Temp: 98 F (36.7 C)   98.2 F (36.8 C)  TempSrc: Oral   Oral  SpO2: 97%   98%  Weight:   79.6 kg   Height:        Intake/Output Summary (Last 24 hours) at 10/20/2018 0751 Last data filed at 10/19/2018 2230 Gross per 24 hour  Intake 1384.77 ml  Output 850 ml  Net 534.77 ml   Last 3 Weights 10/20/2018 10/19/2018 10/18/2018  Weight (lbs) 175 lb 8 oz 180 lb 186 lb 8.2 oz  Weight (kg) 79.606 kg 81.647 kg 84.6 kg      Telemetry    SR with PVCs - Personally Reviewed  ECG    SR - Personally Reviewed  Physical Exam  Pleasant older AAM, sitting in bed. GEN: No acute distress.   Neck: No JVD Cardiac: RRR, no murmurs, rubs, or gallops.  Respiratory: Clear to auscultation bilaterally. GI: Soft, nontender, non-distended  MS: No edema; No deformity. Right radial cath site stable.  Neuro:  Nonfocal  Psych: Normal affect   Labs    High Sensitivity Troponin:   Recent Labs  Lab 10/18/18 1818 10/18/18 2146 10/18/18 2355  TROPONINIHS 30* 1,129* 7,719*       Chemistry Recent Labs  Lab 10/18/18 1818 10/19/18 0822 10/20/18 0448  NA 138 137 137  K 2.9* 3.6 3.7  CL 101 101 103  CO2 24 28 23   GLUCOSE 138* 102* 111*  BUN 17 9 10   CREATININE 1.31* 1.13 1.14  CALCIUM 9.1 8.8* 8.9  GFRNONAA 50* 59* 59*  GFRAA 58* >60 >60  ANIONGAP 13 8 11      Hematology Recent Labs  Lab 10/13/18 1043 10/18/18 1818 10/20/18 0448  WBC 6.5 8.5 12.1*  RBC 4.74 4.94 5.02  HGB 13.7 14.8 14.9  HCT 39.5 44.0 43.6  MCV 83 89.1 86.9  MCH 28.9 30.0 29.7  MCHC 34.7 33.6 34.2  RDW 12.6 13.6 13.6  PLT 233 219 188    BNP Recent Labs  Lab 10/18/18 2355  BNP 204.1*     DDimer No results for input(s): DDIMER in the last 168 hours.   Radiology    Dg Chest 2 View  Result Date: 10/18/2018 CLINICAL DATA:  85 year old male with history of central chest pain for 1 hour. EXAM: CHEST - 2 VIEW COMPARISON:  Chest x-ray 03/05/2016. FINDINGS: Ill-defined opacities are noted throughout the mid to lower lungs bilaterally where there is also mild diffuse interstitial prominence, concerning for  developing multilobar pneumonia. Sparing of the upper lungs. No definite pleural effusions. No evidence of pulmonary edema. Heart size is borderline enlarged. Upper mediastinal contours are within normal limits. Aortic atherosclerosis. Implanted electronic device projecting over the left hemithorax. IMPRESSION: 1. The appearance the chest is concerning for developing multilobar pneumonia, most evident throughout the mid to lower lungs. 2. Aortic atherosclerosis. Electronically Signed   By: Vinnie Langton M.D.   On: 10/18/2018 19:23   Ct Angio Chest Pe W Or Wo Contrast  Result Date: 10/19/2018 CLINICAL DATA:  Chest pain EXAM: CT ANGIOGRAPHY CHEST WITH CONTRAST TECHNIQUE: Multidetector CT imaging of the chest was performed using the standard protocol during bolus administration of intravenous contrast. Multiplanar CT image reconstructions and MIPs were obtained to evaluate the  vascular anatomy. CONTRAST:  152mL OMNIPAQUE IOHEXOL 350 MG/ML SOLN COMPARISON:  Chest x-ray 10/08/2018 FINDINGS: Cardiovascular: Satisfactory opacification of the pulmonary arteries to the segmental level. Motion degradation limits evaluation for distal PE in the lower lobes. No acute central filling defects. Ectatic ascending aorta without aneurysm. Aortic atherosclerosis. Coronary vascular calcification. Borderline cardiomegaly. No pericardial effusion Mediastinum/Nodes: No enlarged mediastinal, hilar, or axillary lymph nodes. Thyroid gland, trachea, and esophagus demonstrate no significant findings. Lungs/Pleura: Lungs are clear. No pleural effusion or pneumothorax. Upper Abdomen: Cysts within the liver. Incompletely visualized 7.5 cm fluid density lesion left upper quadrant probably and exophytic renal cyst. Musculoskeletal: No acute or suspicious osseous abnormality Review of the MIP images confirms the above findings. IMPRESSION: 1. Motion degradation limits evaluation for distal PE in the lower lobes. No acute central embolus is seen. 2. Clear lung fields Aortic Atherosclerosis (ICD10-I70.0). Electronically Signed   By: Donavan Foil M.D.   On: 10/19/2018 03:35    Cardiac Studies   Cath: 10/19/18   The left ventricular systolic function is normal.  LV end diastolic pressure is normal.  The left ventricular ejection fraction is 55-65% by visual estimate.  Prox RCA lesion is 30% stenosed.  Dist RCA lesion is 20% stenosed.  Ost Cx to Prox Cx lesion is 50% stenosed.  Prox LAD to Mid LAD lesion is 60% stenosed.  1st Diag lesion is 90% stenosed.  Dist LAD lesion is 30% stenosed.  2nd Mrg lesion is 90% stenosed.   1.  Severe one-vessel coronary artery disease with subtotal thrombotic occlusion of the distal OM2 at the trifurcation of 3 terminal branches.  The vessel in this area is 1.5 mm in diameter and at a trifurcation and thus not suitable for PCI.  The vessel is also very tortuous.   There is also moderate ostial left circumflex and proximal LAD disease. 2.  Normal LV systolic function and left ventricular end-diastolic pressure.  Recommendations: Recommend aggressive medical therapy.  Eliquis can be resumed tonight.  I favor that he continues to take an antiplatelet medication given this thrombotic event.  He seems to be already on Plavix which should be continued.  Diagnostic Dominance: Right    Patient Profile     85 y.o. male with a history of recent CVA, obstructive sleep apnea (not on CPAP), paroxsymal atrial fibrillation (confirmed with the LINQ device), hypertension and hyperlipidemia who presented to the hospital with complaints of chest pain.   Assessment & Plan    1. NSTEMI: developed substernal chest pain after walking his dog. Radiation into left arm and jaw. HsT peaked at 7719. EKG without acute ischemia. Underwent cardiac cath yesterday noted above with severe single vessel disease with subtotal thrombotic occlusion of distal OM2. Vessel  felt to be not suitable for PCI, very tortuous. Recommendation for medical therapy. Will order cardiac rehab.  -- was on plavix prior to admission. Will continue plavix, statin, BB. Add Imdur 30mg  daily.   2. PAF: Noted on his loop. Seen in the office on 10/8 by Dr. Curt Bears. Recommended to start on Eliquis 5mg . In talking with him, does not appear that he ever filled this. Suspect this could have been the cause of his thrombotic lesion? Discussed the need to start moving forward. CHA2DS2-VASc Score of at least 5.   3. HTN: Blood pressures are borderline. Was on Diltiazem and Metoprolol PTA. Will await Echo. If EF ok, will plan to resume Diltiazem.   4. HL: on Crestor 10mg  daily prior to admission. Increased to 20mg  daily.   5. Hypokalemia: resolved.   For questions or updates, please contact [place] Please consult [email or web address] for contact info under   Signed, Reino Bellis, NP  10/20/2018, 7:51 AM     I have examined the patient and reviewed assessment and plan and discussed with patient.  Agree with above as stated.  Patient with atrial fibrillation known from loop monitor.  Seems possible that he had an intracardiac thrombus that embolized to his OM.  Add Eliquis.  Watch for any bleeding issues.  No aspirin.  He feels well enough to go home.  Continue blood pressure lowering medicines, lipid-lowering therapy.  Larae Grooms

## 2018-10-20 NOTE — Discharge Summary (Addendum)
Discharge Summary    Patient ID: Brian Brown,  MRN: [id number], DOB/AGE: Dec 25, 1932 85 y.o.  Admit date: 10/18/2018 Discharge date: 10/20/2018  Primary Care Provider: Seward Brown Primary Cardiologist: Brian Grooms, MD  Discharge Diagnoses    Principal Problem:   NSTEMI (non-ST elevated myocardial infarction) [facility]) Active Problems:   Hypertension   Stroke/cerebrovascular accident ([place])   Hypokalemia   PAF (paroxysmal atrial fibrillation) ([place])   Allergies No Known Allergies  Diagnostic Studies/Procedures     Cath: 10/19/18   The left ventricular systolic function is normal.  LV end diastolic pressure is normal.  The left ventricular ejection fraction is 55-65% by visual estimate.  Prox RCA lesion is 30% stenosed.  Dist RCA lesion is 20% stenosed.  Ost Cx to Prox Cx lesion is 50% stenosed.  Prox LAD to Mid LAD lesion is 60% stenosed.  1st Diag lesion is 90% stenosed.  Dist LAD lesion is 30% stenosed.  2nd Mrg lesion is 90% stenosed.  1. Severe one-vessel coronary artery disease with subtotal thrombotic occlusion of the distal OM2 at the trifurcation of 3 terminal branches. The vessel in this area is 1.5 mm in diameter and at a trifurcation and thus not suitable for PCI. The vessel is also very tortuous. There is also moderate ostial left circumflex and proximal LAD disease. 2. Normal LV systolic function and left ventricular end-diastolic pressure.  Recommendations: Recommend aggressive medical therapy. Eliquis can be resumed tonight. I favor that he continues to take an antiplatelet medication given this thrombotic event. He seems to be already on Plavix which should be continued.  Diagnostic Dominance: Right  TTE: 10/19/18  IMPRESSIONS    1. Left ventricular ejection fraction, by visual estimation, is 45 to 50%. The left ventricle has mildly decreased function. Left ventricular septal wall thickness was mildly increased.  There is mildly increased left ventricular hypertrophy.  2. Basal and mid inferolateral wall, basal and mid anterolateral wall, basal and mid inferior wall, and apical lateral segment are abnormal.  3. Left ventricular diastolic Doppler parameters are consistent with restrictive filling pattern of LV diastolic filling.  4. Global right ventricle has normal systolic function.The right ventricular size is normal. No increase in right ventricular wall thickness.  5. Left atrial size was mildly dilated.  6. Right atrial size was mildly dilated.  7. The mitral valve is normal in structure. Trace mitral valve regurgitation.  8. The tricuspid valve is normal in structure. Tricuspid valve regurgitation is mild.  9. The aortic valve is tricuspid Aortic valve regurgitation was not visualized by color flow Doppler. Mild aortic valve sclerosis without stenosis. 10. The pulmonic valve was grossly normal. Pulmonic valve regurgitation is mild by color flow Doppler. 11. Aortic dilatation noted. 12. There is mild dilatation of the ascending aorta measuring 37 mm. 13. Normal pulmonary artery systolic pressure.  _____________   History of Present Illness     Brian Brown is a 85 y.o. male with a history of recent CVA, obstructive sleep apnea (not on CPAP), paroxsymal atrial fibrillation (confirmed with the LINQ device), hypertension and hyperlipidemia who presented to the hospital with complaints of chest pain. The patient reported acute onset of substernal chest pain after dinner when he returned home after walking his dog. He had some associated diaphoresis. The pain radiated to the jaw and both arms. Due to an elevated blood pressure in the ED he was started on an IV nitroglycerin infusion.  The patient had a stroke earlier this year (  February 2020) with complaints of left hemianopsia. Imaging revealed a right PCA infarct consistent with an embolic pattern. He underwent placement of a LINQ loop monitor  implant on 09/16/2018. The monitor documented true atrial fibrillation with an episode of 10 hr 26 mins duration. Therefore on 10/13/2018 during a clinic visit with Dr. Curt Brown it was suggested that he stop Plavix and start taking Eliquis. The patient unfortunately did not clearly recall the medication he is on currently.  In the ED his BP was 173/101 mmHg with a heart rate of 89 bpm. The ECG documented sinus rhythm with T wave changes in the inferolateral leads.The high sensitivity troponins were 30, 1129 and 7719. The CXR suggested ill-defined opacities noted throughout the mid to lower lungs bilaterally. A CT Angio of the chest however showed clear lung fields and did not reveal a pulmonary embolus. He was placed on IV heparin and admitted for further work-up.  Hospital Course     High-sensitivity troponin peaked at 7719.  EKG remained without ischemia.  He underwent cardiac catheterization noted above with severe single-vessel disease with subtotal thrombotic occlusion of distal OM 2.  Vessel was felt not to be suitable for PCI given trifurcation and was very torturous.  Recommendation was to continue on medical therapy.  He is noted to have a history of PAF that was discovered on his loop recorder.  When seen in the office on 10/8 by Dr. Curt Brown it was recommended that he start on Eliquis 5 mg twice daily.  In further talking with the patient it appears that he never filled this medication. Suspect that this could have been the cause of his thrombotic lesion.  Discussed the importance of starting this for stroke risk reduction.  CHA2DS2-VASc score of at least 5.  Medical therapy at discharge included Plavix, statin, beta-blocker,and Eliquis. His home Diltiazem was stopped given slight reduction in his EF, and started on lisinopril 10mg  daily.  Imdur 30 mg daily was added post cath.  Home dose Crestor was switched to atorvastatin 40 mg secondary to lower cost for the patient.  Seen by cardiac rehab and  ambulated without recurrent chest pain.    Brian Brown was seen by Dr. Irish Brown and determined stable for discharge home. Follow up in the office has been arranged. Medications are listed below.   _____________  Discharge Vitals Blood pressure (!) 143/96, pulse 73, temperature 98.2 F (36.8 C), temperature source Oral, resp. rate 15, height 5\' 9"  (1.753 m), weight 79.6 kg, SpO2 98 %.  Filed Weights   10/18/18 1816 10/19/18 0000 10/20/18 0500  Weight: 84.6 kg 81.6 kg 79.6 kg    Labs & Radiologic Studies    CBC Recent Labs    10/18/18 1818 10/20/18 0448  WBC 8.5 12.1*  HGB 14.8 14.9  HCT 44.0 43.6  MCV 89.1 86.9  PLT 219 0000000   Basic Metabolic Panel Recent Labs    10/19/18 0822 10/20/18 0448  NA 137 137  K 3.6 3.7  CL 101 103  CO2 28 23  GLUCOSE 102* 111*  BUN 9 10  CREATININE 1.13 1.14  CALCIUM 8.8* 8.9   Liver Function Tests No results for input(s): AST, ALT, ALKPHOS, BILITOT, PROT, ALBUMIN in the last 72 hours. No results for input(s): LIPASE, AMYLASE in the last 72 hours. Cardiac Enzymes No results for input(s): CKTOTAL, CKMB, CKMBINDEX, TROPONINI in the last 72 hours. BNP Invalid input(s): POCBNP D-Dimer No results for input(s): DDIMER in the last 72 hours. Hemoglobin A1C No  results for input(s): HGBA1C in the last 72 hours. Fasting Lipid Panel No results for input(s): CHOL, HDL, LDLCALC, TRIG, CHOLHDL, LDLDIRECT in the last 72 hours. Thyroid Function Tests No results for input(s): TSH, T4TOTAL, T3FREE, THYROIDAB in the last 72 hours.  Invalid input(s): FREET3 _____________  Dg Chest 2 View  Result Date: 10/18/2018 CLINICAL DATA:  85 year old male with history of central chest pain for 1 hour. EXAM: CHEST - 2 VIEW COMPARISON:  Chest x-ray 03/05/2016. FINDINGS: Ill-defined opacities are noted throughout the mid to lower lungs bilaterally where there is also mild diffuse interstitial prominence, concerning for developing multilobar pneumonia.  Sparing of the upper lungs. No definite pleural effusions. No evidence of pulmonary edema. Heart size is borderline enlarged. Upper mediastinal contours are within normal limits. Aortic atherosclerosis. Implanted electronic device projecting over the left hemithorax. IMPRESSION: 1. The appearance the chest is concerning for developing multilobar pneumonia, most evident throughout the mid to lower lungs. 2. Aortic atherosclerosis. Electronically Signed   By: Vinnie Langton M.D.   On: 10/18/2018 19:23   Ct Angio Chest Pe W Or Wo Contrast  Result Date: 10/19/2018 CLINICAL DATA:  Chest pain EXAM: CT ANGIOGRAPHY CHEST WITH CONTRAST TECHNIQUE: Multidetector CT imaging of the chest was performed using the standard protocol during bolus administration of intravenous contrast. Multiplanar CT image reconstructions and MIPs were obtained to evaluate the vascular anatomy. CONTRAST:  116mL OMNIPAQUE IOHEXOL 350 MG/ML SOLN COMPARISON:  Chest x-ray 10/08/2018 FINDINGS: Cardiovascular: Satisfactory opacification of the pulmonary arteries to the segmental level. Motion degradation limits evaluation for distal PE in the lower lobes. No acute central filling defects. Ectatic ascending aorta without aneurysm. Aortic atherosclerosis. Coronary vascular calcification. Borderline cardiomegaly. No pericardial effusion Mediastinum/Nodes: No enlarged mediastinal, hilar, or axillary lymph nodes. Thyroid gland, trachea, and esophagus demonstrate no significant findings. Lungs/Pleura: Lungs are clear. No pleural effusion or pneumothorax. Upper Abdomen: Cysts within the liver. Incompletely visualized 7.5 cm fluid density lesion left upper quadrant probably and exophytic renal cyst. Musculoskeletal: No acute or suspicious osseous abnormality Review of the MIP images confirms the above findings. IMPRESSION: 1. Motion degradation limits evaluation for distal PE in the lower lobes. No acute central embolus is seen. 2. Clear lung fields Aortic  Atherosclerosis (ICD10-I70.0). Electronically Signed   By: Donavan Foil M.D.   On: 10/19/2018 03:35   Disposition   Pt is being discharged home today in good condition.  Follow-up Plans & Appointments    Follow-up Information    Charlie Pitter, PA-C Follow up on 11/01/2018.   Specialties: Cardiology, Radiology Why: at 11am for your follow up appt.  Contact information: [address] [place] [address] [phone]          Discharge Instructions    AMB Referral to Cardiac Rehabilitation - Phase II   Complete by: As directed    Diagnosis: NSTEMI   After initial evaluation and assessments completed: Virtual Based Care may be provided alone or in conjunction with Phase 2 Cardiac Rehab based on patient barriers.: Yes   Call MD for:  redness, tenderness, or signs of infection (pain, swelling, redness, odor or green/yellow discharge around incision site)   Complete by: As directed    Diet - low sodium heart healthy   Complete by: As directed    Discharge instructions   Complete by: As directed    Radial Site Care Refer to this sheet in the next few weeks. These instructions provide you with information on caring for yourself after  your procedure. Your caregiver may also give you more specific instructions. Your treatment has been planned according to current medical practices, but problems sometimes occur. Call your caregiver if you have any problems or questions after your procedure. HOME CARE INSTRUCTIONS You may shower the day after the procedure.Remove the bandage (dressing) and gently wash the site with plain soap and water.Gently pat the site dry.  Do not apply powder or lotion to the site.  Do not submerge the affected site in water for 3 to 5 days.  Inspect the site at least twice daily.  Do not flex or bend the affected arm for 24 hours.  No lifting over 5 pounds (2.3 kg) for 5 days after your procedure.  Do not drive home if you are discharged  the same day of the procedure. Have someone else drive you.  You may drive 24 hours after the procedure unless otherwise instructed by your caregiver.  What to expect: Any bruising will usually fade within 1 to 2 weeks.  Blood that collects in the tissue (hematoma) may be painful to the touch. It should usually decrease in size and tenderness within 1 to 2 weeks.  SEEK IMMEDIATE MEDICAL CARE IF: You have unusual pain at the radial site.  You have redness, warmth, swelling, or pain at the radial site.  You have drainage (other than a small amount of blood on the dressing).  You have chills.  You have a fever or persistent symptoms for more than 72 hours.  You have a fever and your symptoms suddenly get worse.  Your arm becomes pale, cool, tingly, or numb.  You have heavy bleeding from the site. Hold pressure on the site.   Please monitor your blood pressures at home and bring to your follow up appt.   Increase activity slowly   Complete by: As directed       Discharge Medications     Medication List    STOP taking these medications   diltiazem 240 MG 24 hr capsule Commonly known as: DILACOR XR   rosuvastatin 10 MG tablet Commonly known as: CRESTOR     TAKE these medications   apixaban 5 MG Tabs tablet Commonly known as: ELIQUIS Take 1 tablet (5 mg total) by mouth 2 (two) times daily.   atorvastatin 40 MG tablet Commonly known as: Lipitor Take 1 tablet (40 mg total) by mouth daily.   clopidogrel 75 MG tablet Commonly known as: PLAVIX Take 75 mg by mouth daily with breakfast.   furosemide 20 MG tablet Commonly known as: LASIX Take 20 mg by mouth daily as needed for fluid.   isosorbide mononitrate 30 MG 24 hr tablet Commonly known as: IMDUR Take 1 tablet (30 mg total) by mouth daily.   lisinopril 10 MG tablet Commonly known as: ZESTRIL Take 1 tablet (10 mg total) by mouth daily.   metoprolol tartrate 25 MG tablet Commonly known as: LOPRESSOR Take 25 mg by  mouth 2 (two) times daily.   nitroGLYCERIN 0.4 MG SL tablet Commonly known as: NITROSTAT Place 1 tablet (0.4 mg total) under the tongue every 5 (five) minutes x 3 doses as needed for chest pain.   OMEGA 3-6-9 COMPLEX PO Take 2-3 capsules by mouth See admin instructions. 3 caps in the morning, and 2 caps in the evening        Yes  AHA/ACC Clinical Performance & Quality Measures: 1. Aspirin prescribed? - No - on Eliquis. No PCI 2. ADP Receptor Inhibitor (Plavix/Clopidogrel, Brilinta/Ticagrelor or Effient/Prasugrel) prescribed (includes medically managed patients)? - Yes 3. Beta Blocker prescribed? - Yes 4. High Intensity Statin (Lipitor 40-80mg  or Crestor 20-40mg ) prescribed? - Yes 5. EF assessed during THIS hospitalization? - Yes 6. For EF <40%, was ACEI/ARB prescribed? - Yes 7. For EF <40%, Aldosterone Antagonist (Spironolactone or Eplerenone) prescribed? - Not Applicable (EF >/= [id number]) 8. Cardiac Rehab Phase II ordered (Included Medically managed Patients)? - Yes      Outstanding Labs/Studies   BMET at follow up appt.   Duration of Discharge Encounter   Greater than 30 minutes including physician time.  Signed, Tieler Markham NP-C 10/20/2018, 12:32 PM   I have examined the patient and reviewed assessment and plan and discussed with patient.  Agree with above as stated.  Patient with atrial fibrillation known from loop monitor.  Seems possible that he had an intracardiac thrombus that embolized to his OM.  Add Eliquis.  Watch for any bleeding issues.  No aspirin.  He feels well enough to go home.  Continue blood pressure lowering medicines, lipid-lowering therapy.  Brian Brown

## 2018-10-20 NOTE — Plan of Care (Signed)
  Problem: Health Behavior/Discharge Planning: Goal: Ability to manage health-related needs will improve Outcome: Progressing   Problem: Clinical Measurements: Goal: Will remain free from infection Outcome: Progressing   Problem: Coping: Goal: Level of anxiety will decrease Outcome: Progressing

## 2018-10-20 NOTE — Progress Notes (Signed)
CARDIAC REHAB PHASE I   PRE:  Rate/Rhythm: 82 SR  BP:  Sitting: 146/94      SaO2: 97 RA  MODE:  Ambulation: 200 ft   POST:  Rate/Rhythm: 86 SR  BP:  Sitting: 143/96    SaO2: 96 RA  Pt ambulated 264ft in hallway assist of one with gait belt and front wheel walker. Pt denies CP or SOB, does appear weak and unsteady on feet. Pt admits to falls at home, sometimes because he gets tripped up when walking his dog. Pt educated on importance of Plavix and Eliquis, the high risk of bleeds while on these meds, and importance of safety. Pt given MI book and heart healthy diet. Reviewed restrictions and site care. Encouraged ambulation as able with emphasis on safety. Will refer to CRP II [place], pt not able to attend.  [id number] Rufina Falco, RN BSN 10/20/2018 9:21 AM

## 2018-10-24 ENCOUNTER — Other Ambulatory Visit: Payer: Self-pay

## 2018-10-24 ENCOUNTER — Ambulatory Visit (INDEPENDENT_AMBULATORY_CARE_PROVIDER_SITE_OTHER): Payer: Medicare Other | Admitting: *Deleted

## 2018-10-24 DIAGNOSIS — I639 Cerebral infarction, unspecified: Secondary | ICD-10-CM | POA: Diagnosis not present

## 2018-10-24 DIAGNOSIS — I480 Paroxysmal atrial fibrillation: Secondary | ICD-10-CM

## 2018-10-24 LAB — CUP PACEART REMOTE DEVICE CHECK
Date Time Interrogation Session: 20201019085008
Implantable Pulse Generator Implant Date: 20200911

## 2018-10-24 LAB — CULTURE, BLOOD (ROUTINE X 2)
Culture: NO GROWTH
Culture: NO GROWTH
Special Requests: ADEQUATE

## 2018-10-25 ENCOUNTER — Telehealth: Payer: Self-pay | Admitting: Emergency Medicine

## 2018-10-25 NOTE — Telephone Encounter (Signed)
Contacted due to alert for 100 % AF .Reports he is feeling better after having LHC and is not experincing any CP, SOB. dizziness,palpitations or syncope. Patient is taking Eliquis and will contact office if he develops any of the above symptoms or seek treatment in the ED if they occur after hours of operation for the clinic.

## 2018-10-27 ENCOUNTER — Telehealth: Payer: Self-pay

## 2018-10-27 NOTE — Telephone Encounter (Signed)
-----   Message from Will Meredith Leeds, MD sent at 10/24/2018  2:59 PM EDT ----- Abnormal LINQ reviewed. Notable for rapid AF. On eliquis. Increase metoprolol to 50 mg BID.Marland Kitchen

## 2018-10-27 NOTE — Telephone Encounter (Signed)
I called and spoke with patient about abnormal LINQ, he is aware to increase Metoprolol to 50MG  BID. Patient wants to know if his appointment on 11/01/18 can be a virtual visit. He does not have transportation. Please call patient.

## 2018-10-30 ENCOUNTER — Encounter: Payer: Self-pay | Admitting: Physician Assistant

## 2018-10-30 NOTE — Progress Notes (Addendum)
cough   Cardiology Office Note    Date:  11/01/2018   ID:  Brian Brown, DOB 06-02-1932, MRN [id number]  PCP:  Seward Carol, MD  Cardiologist:  Larae Grooms, MD  Electrophysiologist:  Constance Haw, MD   Chief Complaint: f/u MI  History of Present Illness:   Brian Brown is a 85 y.o. male with history of CVA 02/2018 with PAF confirmed on loop recorder, obstructive sleep apnea (not on CPAP), recently diagnosed CAD with NSTEMI treated medically as outlined below, mild cardiomyopathy (EF 45-50%), hypertension, hyperlipidemia, CKD II, mild dilation of ascending aorta, prostate CA who presents for post-hospital follow-up.  In 02/2018 he sustained an embolic stroke with left hemianopia. It was recommended that he have a TEE as an outpatient followed by a loop monitor implant. TEE 09/16/18 showed normal LV function, small density on aortic valve (likely lambl's excrescence), spontaneous contrast in left atrium and appendage but no thrombus, spontaneous contrast descending aorta, and negative saline microcavitation study. He subsequently underwent placement of loop recorder. Ultimately this did demonstrate paroxysms of atrial fibrillation. Therefore on 10/13/2018 during a clinic visit with Dr. Curt Bears it was suggested that he stop Plavix and start taking Eliquis. The patient unfortunately did not seem to enact changes as recommended. He was recently admitted 10/13-10/15/20 with chest pain and NSTEMI (peak troponin 7719). CTA was negative for PE and showed clear lungs despite some ill defined opacities on CXR. He underwent cardiac cath with severe single-vessel disease with subtotal thrombotic occlusion of distal OM2. The vessel was felt not to be suitable for PCI given trifurcation and was very torturous. Recommendation was for medical therapy with Plavix given thrombotic lesion. He otherwise had nonobstructive disease in other vessels. 2D echo 10/19/18 showed EF 45-50%, mild LVH, + WMA,  impaired diastolic filling, normal RV function, mild LAE/RAE, mild dilation of ascending aorta. In further talking with the patient it appeared that he never filled his Eliquis. The team speculated it was also possible that he could have had an intracardial thrombus that embolized to his OM so Eliquis was added. Aspirin was not used given need for Plavix + Eliquis and no PCI. His home Diltiazem was stopped given slight reduction in his EF and started on lisinopril 10mg  daily. Imdur 30 mg daily was added post cath. Home dose Crestor was switched to atorvastatin 40 mg secondary to lower cost for the patient. Per monitor phone report 10/24/18, metoprolol was increased to 50mg  BID for paroxysms of rapid atrial fib (12.9% AF burden). Last labs showed K 3.7, Cr 1.14, Hgb 14.9, no recent TSH, 02/2018 AST/ALT OK, LDL 62.  He is seen back for follow-up today overall feeling stable. He is a vague historian. He denies any specific complaints aside from generalized fatigue the last few days. This might correlate with increasing the beta blocker, he's not sure. No CP, SOB, palpitations, chills, fever (temp 98.5 today), edema, orthopnea or cough. He manages his medicines himself. He seems to affirm taking the medications on our list today but this is only after suggesting the names to him. It does not seem that he can freely name them on his own. Metoprolol 25mg  BID was still listed but when I asked him about doubling this to 2 tablets BID as advised by our office a few days ago, he reported he'd done so. He has a daughter in [place] with whom he speaks on the phone every morning. It sounds like she walks him through his medications but he's rather  vague about the process of how he actually takes his medicine. Denies any bleeding.   Past Medical History:  Diagnosis Date   Arthritis    degenerative joint disease   Bleeding stomach ulcer 25 yrs ago   CAD (coronary artery disease)    a. NSTEMI 10/2018 thrombotic  lesion to OM2, 90% D1 (medical therapy), otherwise nonobstructive disease.   Cancer [facility]) 2002   prostate, s/p surgery   Cardiomyopathy ([place])    a. EF 45-50 by echo 10/2018.   CKD (chronic kidney disease), stage II    Hepatitis 1981   does not know what kind;denies jaundice   Hypertension    treated by Dr Delfina Redwood, primary MD   Mild dilation of ascending aorta ([place])    PAF (paroxysmal atrial fibrillation) ([place])    Sleep apnea 2005   does not wear a Cpap   Stroke ([place]) 2003   w/ Rt sided weakness-some-rt leg   Wears dentures    full top   Wears glasses     Past Surgical History:  Procedure Laterality Date   BUBBLE STUDY  09/16/2018   Procedure: BUBBLE STUDY;  Surgeon: Lelon Perla, MD;  Location: [facility];  Service: Cardiovascular;;   CARDIAC CATHETERIZATION  10/19/2018   CARPAL TUNNEL RELEASE Left 03/29/2013   Procedure: LEFT CARPAL TUNNEL RELEASE;  Surgeon: Wynonia Sours, MD;  Location: [facility];  Service: Orthopedics;  Laterality: Left;  ANESTHESIA: IV REGIONAL FAB   CARPAL TUNNEL RELEASE Right 03/06/2014   Procedure: RIGHT CARPAL TUNNEL RELEASE;  Surgeon: Daryll Brod, MD;  Location: [facility];  Service: Orthopedics;  Laterality: Right;   COLONOSCOPY     HERNIA REPAIR  2009   rt inguinal hernia   JOINT REPLACEMENT  12/2001   rt hip ;[place] CATH AND CORONARY ANGIOGRAPHY N/A 10/19/2018   Procedure: LEFT HEART CATH AND CORONARY ANGIOGRAPHY;  Surgeon: Wellington Hampshire, MD;  Location: [place] CV LAB;  Service: Cardiovascular;  Laterality: N/A;   LOOP RECORDER INSERTION N/A 09/16/2018   Procedure: LOOP RECORDER INSERTION;  Surgeon: Constance Haw, MD;  Location: [place] [facility];  Service: Cardiovascular;  Laterality: N/A;   PROSTATECTOMY  2002   SHOULDER ARTHROSCOPY W/ ROTATOR CUFF REPAIR  2014   lt-dr whitfield   TEE WITHOUT CARDIOVERSION N/A 09/16/2018   Procedure: TRANSESOPHAGEAL  ECHOCARDIOGRAM (TEE);  Surgeon: Lelon Perla, MD;  [facility] ENDOSCOPY;  Service: Cardiovascular;  Laterality: N/A;   TOTAL HIP ARTHROPLASTY  02/03/2011   Procedure: TOTAL HIP ARTHROPLASTY;  Surgeon: Garald Balding, MD;  Location: [facility];  Service: Orthopedics;  Laterality: Left;   TRIGGER FINGER RELEASE Left 03/29/2013   Procedure: LEFT MIDDLE/SMALL FINGER RELEASE A-1 PULLEY AND LEFT RING FINGER;  Surgeon: Wynonia Sours, MD;  Location: [facility];  Service: Orthopedics;  Laterality: Left;   TRIGGER FINGER RELEASE Right 03/06/2014   Procedure: RIGHT MIDDLE FINGER,RIGHT RINGER FINGER;  Surgeon: Daryll Brod, MD;  Location: [facility];  Service: Orthopedics;  Laterality: Right;    Current Medications: Current Meds  Medication Sig   apixaban (ELIQUIS) 5 MG TABS tablet Take 1 tablet (5 mg total) by mouth 2 (two) times daily.   atorvastatin (LIPITOR) 40 MG tablet Take 1 tablet (40 mg total) by mouth daily.   clopidogrel (PLAVIX) 75 MG tablet Take 75 mg by mouth daily with breakfast.   furosemide (LASIX) 20 MG tablet Take 20 mg by mouth daily  as needed for fluid.    isosorbide mononitrate (IMDUR) 30 MG 24 hr tablet Take 1 tablet (30 mg total) by mouth daily.   lisinopril (ZESTRIL) 10 MG tablet Take 1 tablet (10 mg total) by mouth daily.   metoprolol tartrate (LOPRESSOR) 25 MG tablet Take 25 mg by mouth 2 (two) times daily.   nitroGLYCERIN (NITROSTAT) 0.4 MG SL tablet Place 1 tablet (0.4 mg total) under the tongue every 5 (five) minutes x 3 doses as needed for chest pain.   Omega 3-6-9 Fatty Acids (OMEGA 3-6-9 COMPLEX PO) Take 2-3 capsules by mouth See admin instructions. 3 caps in the morning, and 2 caps in the evening      Allergies:   Patient has no known allergies.   Social History   Socioeconomic History   Marital status: Widowed    Spouse name: Not on file   Number of children: Not on file   Years of education: Not on file   Highest  education level: Not on file  Occupational History   Not on file  Social Needs   Financial resource strain: Not on file   Food insecurity    Worry: Not on file    Inability: Not on file   Transportation needs    Medical: Not on file    Non-medical: Not on file  Tobacco Use   Smoking status: Former Smoker    Packs/day: 1.00    Years: 30.00    Pack years: 30.00    Types: Cigarettes    Quit date: 01/27/2008    Years since quitting: 10.7   Smokeless tobacco: Never Used  Substance and Sexual Activity   Alcohol use: No   Drug use: No   Sexual activity: Not on file  Lifestyle   Physical activity    Days per week: Not on file    Minutes per session: Not on file   Stress: Not on file  Relationships   Social connections    Talks on phone: Not on file    Gets together: Not on file    Attends religious service: Not on file    Active member of club or organization: Not on file    Attends meetings of clubs or organizations: Not on file    Relationship status: Not on file  Other Topics Concern   Not on file  Social History Narrative   Not on file     Family History:  The patient's family history includes Heart disease in his father and mother.  ROS:   Please see the history of present illness.  All other systems are reviewed and otherwise negative.    EKGs/Labs/Other Studies Reviewed:    Studies reviewed were summarized above.   EKG:  EKG is not ordered today. Clinically in NSR 60s  Recent Labs: 02/13/2018: ALT 15 10/18/2018: B Natriuretic Peptide 204.1 10/20/2018: BUN 10; Creatinine, Ser 1.14; Hemoglobin 14.9; Platelets 188; Potassium 3.7; Sodium 137  Recent Lipid Panel    Component Value Date/Time   CHOL 117 02/14/2018 0303   TRIG 48 02/14/2018 0303   HDL 45 02/14/2018 0303   CHOLHDL 2.6 02/14/2018 0303   VLDL 10 02/14/2018 0303   LDLCALC 62 02/14/2018 0303    PHYSICAL EXAM:    VS:  BP 132/90    Pulse 68    Ht 5\' 9"  (1.753 m)    Wt 181 lb 6.4 oz  (82.3 kg)    SpO2 97%    BMI 26.79 kg/m   BMI: Body  mass index is 26.79 kg/m.  GEN: Well nourished, well developed AAM in no acute distress HEENT: normocephalic, atraumatic Neck: no JVD, carotid bruits, or masses Cardiac: RRR; no murmurs, rubs, or gallops, no edema  Respiratory:  clear to auscultation bilaterally, normal work of breathing GI: soft, nontender, nondistended, + BS MS: no deformity or atrophy Skin: warm and dry, no rash. Right radial cath site without hematoma or ecchymosis; good pulse. Neuro:  Alert and Oriented x 3, Strength and sensation are intact, follows commands Psych: euthymic mood, full affect  Wt Readings from Last 3 Encounters:  11/01/18 181 lb 6.4 oz (82.3 kg)  10/20/18 175 lb 8 oz (79.6 kg)  10/13/18 186 lb 6.4 oz (84.6 kg)     ASSESSMENT & PLAN:   1. Generalized fatigue - nonspecific, no acute localizing symptoms. He is in NSR in the 60s today. No fever or chills. His sense of decreased energy possibly correlated with beta blocker increase but he;s not sure. He is somewhat of a vague historian. Given the prior issue of never starting the Eliquis in the past, I worry that medication reconciliation/compliance could be an issue that he is not particularly forthcoming with. He engages with his daughter by phone daily but no one is actually looking at his pill bottles but himself. Additionally, he has had several medicine changes recently which could be confusing. We will request [facility] go out to his house for med safety visit ASAP, and then again in another 2-3 weeks in conjunction with a tag-team virtual visit to follow up on his fatigue. I've also asked MA to reach out to the patient's daughter to keep her in the loop of the plan as well. Patient agreeable to this plan. Addendum: [facility] unable to take on referral (see phone note), but will have our team route to [facility] to assist. 2. CAD - asymptomatic. He was started on Plavix for recent NSTEMI,  no PCI. He is not on ASA due to need to avoid triple therapy. He will continue metoprolol, statin, and Imdur. See above regarding his medications. I will hold off ordering f/u lipids until we can ensure compliance with recent change in statin. This can be reassessed in 3 months. 3. Paroxysmal atrial fibrillation - maintaining NSR in the office today. He will continue Eliquis for now. Check CBC today. 4. Mild cardiomyopathy - appears euvolemic. Check BMET since lisinopril was started. 5. Mild dilation of ascending aorta - will be due for echo in 10/2019. Will defer to primary cardiologist if appropriate closer to that time.   Disposition: F/u with me virtually in 2-3 weeks with tag-teamed [facility] staff visit. Will also arrange f/u with Dr. Irish Lack in the office in 3 months.  Medication Adjustments/Labs and Tests Ordered: Current medicines are reviewed at length with the patient today.  Concerns regarding medicines are outlined above. Medication changes, Labs and Tests ordered today are summarized above and listed in the Patient Instructions accessible in Encounters.   Signed, Charlie Pitter, PA-C  11/01/2018 11:50 AM    [address], [place], [place]  [address] Phone: [phone]; Fax: [phone]

## 2018-10-31 ENCOUNTER — Encounter: Payer: Self-pay | Admitting: Physician Assistant

## 2018-10-31 NOTE — Telephone Encounter (Signed)
Thanks for the message. He would be suitable for virtual visit if a) he has a way to check his vitals during time of visit, b) if there is someone present during his visit to assist with medication instructions and c) if no complications with cath site. He will also need to come into the office for labs since his meds were adjusted (new start lisinopril) but we can do a lab appt only after the virtual visit if he still decides virtual visit.  Trina Asch PA-C

## 2018-10-31 NOTE — Telephone Encounter (Signed)
Spoke with pt re: wanting a virtual appt for 11/01/2018. Pt has no way to check his vital signs, but actually told me that he has already found a ride, so confirmed with pt that he will be seen in office.

## 2018-11-01 ENCOUNTER — Ambulatory Visit (INDEPENDENT_AMBULATORY_CARE_PROVIDER_SITE_OTHER): Payer: Medicare Other | Admitting: Physician Assistant

## 2018-11-01 ENCOUNTER — Telehealth: Payer: Self-pay | Admitting: *Deleted

## 2018-11-01 ENCOUNTER — Telehealth: Payer: Self-pay | Admitting: Physician Assistant

## 2018-11-01 ENCOUNTER — Encounter: Payer: Self-pay | Admitting: Physician Assistant

## 2018-11-01 ENCOUNTER — Other Ambulatory Visit: Payer: Self-pay

## 2018-11-01 VITALS — BP 132/90 | HR 68 | Ht 69.0 in | Wt 181.4 lb

## 2018-11-01 DIAGNOSIS — I480 Paroxysmal atrial fibrillation: Secondary | ICD-10-CM | POA: Diagnosis not present

## 2018-11-01 DIAGNOSIS — I77810 Thoracic aortic ectasia: Secondary | ICD-10-CM | POA: Diagnosis not present

## 2018-11-01 DIAGNOSIS — R5383 Other fatigue: Secondary | ICD-10-CM

## 2018-11-01 DIAGNOSIS — I2510 Atherosclerotic heart disease of native coronary artery without angina pectoris: Secondary | ICD-10-CM

## 2018-11-01 DIAGNOSIS — I429 Cardiomyopathy, unspecified: Secondary | ICD-10-CM | POA: Diagnosis not present

## 2018-11-01 DIAGNOSIS — Z23 Encounter for immunization: Secondary | ICD-10-CM | POA: Diagnosis not present

## 2018-11-01 DIAGNOSIS — I639 Cerebral infarction, unspecified: Secondary | ICD-10-CM | POA: Diagnosis not present

## 2018-11-01 NOTE — Telephone Encounter (Signed)
  Betsy with [facility] is calling to say they are unable to take this referral.

## 2018-11-01 NOTE — Patient Instructions (Addendum)
Medication Instructions:  Your physician recommends that you continue on your current medications as directed. Please refer to the Current Medication list given to you today.  *If you need a refill on your cardiac medications before your next appointment, please call your pharmacy*  Lab Work: TODAY:  BMET, CBC, & TSH  If you have labs (blood work) drawn today and your tests are completely normal, you will receive your results only by: Marland Kitchen MyChart Message (if you have MyChart) OR . A paper copy in the mail If you have any lab test that is abnormal or we need to change your treatment, we will call you to review the results.  Testing/Procedures: None ordered  Follow-Up: At [facility], you and your health needs are our priority.  As part of our continuing mission to provide you with exceptional heart care, we have created designated Provider Care Teams.  These Care Teams include your primary Cardiologist (physician) and Advanced Practice Providers (APPs -  Physician Assistants and Nurse Practitioners) who all work together to provide you with the care you need, when you need it.  Your next appointment:   11/09/2018 AT 10:15 01/26/2019 1:20 (arrive at 1:05 for registration)  The format for your next appointment:   Virtual Visit   11/09/2018 In Office 01/25/2018  Provider:   Melina Copa, PA-C 11/09/2018 Dr. Irish Lack 01/26/2019  Other Instructions  We are going to have [facility] come out to your home to help you with your medications.  They will call you to arrange this.     Virtual Visit Pre-Appointment Phone Call  "(Name), I am calling you today to discuss your upcoming appointment. We are currently trying to limit exposure to the virus that causes COVID-19 by seeing patients at home rather than in the office."  1. "What is the BEST phone number to call the day of the visit?" - include this in appointment notes  2. "Do you have or have access to (through a family member/friend) a  smartphone with video capability that we can use for your visit?" a. If yes - list this number in appt notes as "cell" (if different from BEST phone #) and list the appointment type as a VIDEO visit in appointment notes b. If no - list the appointment type as a PHONE visit in appointment notes  3. Confirm consent - "In the setting of the current Covid19 crisis, you are scheduled for a (phone or video) visit with your provider on (date) at (time).  Just as we do with many in-office visits, in order for you to participate in this visit, we must obtain consent.  If you'd like, I can send this to your mychart (if signed up) or email for you to review.  Otherwise, I can obtain your verbal consent now.  All virtual visits are billed to your insurance company just like a normal visit would be.  By agreeing to a virtual visit, we'd like you to understand that the technology does not allow for your provider to perform an examination, and thus may limit your provider's ability to fully assess your condition. If your provider identifies any concerns that need to be evaluated in person, we will make arrangements to do so.  Finally, though the technology is pretty good, we cannot assure that it will always work on either your or our end, and in the setting of a video visit, we may have to convert it to a phone-only visit.  In either situation, we cannot ensure  that we have a secure connection.  Are you willing to proceed?" STAFF: Did the patient verbally acknowledge consent to telehealth visit? Document YES/NO here: YES  4. Advise patient to be prepared - "Two hours prior to your appointment, go ahead and check your blood pressure, pulse, oxygen saturation, and your weight (if you have the equipment to check those) and write them all down. When your visit starts, your provider will ask you for this information. If you have an Apple Watch or Kardia device, please plan to have heart rate information ready on the day of your  appointment. Please have a pen and paper handy nearby the day of the visit as well."  5. Give patient instructions for MyChart download to smartphone OR Doximity/Doxy.me as below if video visit (depending on what platform provider is using)  6. Inform patient they will receive a phone call 15 minutes prior to their appointment time (may be from unknown caller ID) so they should be prepared to answer    TELEPHONE CALL NOTE  Brian Brown has been deemed a candidate for a follow-up tele-health visit to limit community exposure during the Covid-19 pandemic. I spoke with the patient via phone to ensure availability of phone/video source, confirm preferred email & phone number, and discuss instructions and expectations.  I reminded Brian Brown to be prepared with any vital sign and/or heart rhythm information that could potentially be obtained via home monitoring, at the time of his visit. I reminded Brian Brown to expect a phone call prior to his visit.  Jeanann Lewandowsky, [place] 11/01/2018 12:32 PM   INSTRUCTIONS FOR DOWNLOADING THE MYCHART APP TO SMARTPHONE  - The patient must first make sure to have activated MyChart and know their login information - If Apple, go to [employer] and type in MyChart in the search bar and download the app. If Android, ask patient to go to [employer] and type in [place] in the search bar and download the app. The app is free but as with any other app downloads, their phone may require them to verify saved payment information or Apple/Android password.  - The patient will need to then log into the app with their MyChart username and password, and select [facility] as their healthcare provider to link the account. When it is time for your visit, go to the MyChart app, find appointments, and click Begin Video Visit. Be sure to Select Allow for your device to access the Microphone and Camera for your visit. You will then be connected, and your provider  will be with you shortly.  **If they have any issues connecting, or need assistance please contact MyChart service desk [phone])**  **If using a computer, in order to ensure the best quality for their visit they will need to use either of the following Internet Browsers: [employer], or Google Chrome**  IF USING DOXIMITY or DOXY.ME - The patient will receive a link just prior to their visit by text.     FULL LENGTH CONSENT FOR TELE-HEALTH VISIT   I hereby voluntarily request, consent and authorize [facility] and its employed or contracted physicians, physician assistants, nurse practitioners or other licensed health care professionals (the Practitioner), to provide me with telemedicine health care services (the "Services") as deemed necessary by the treating Practitioner. I acknowledge and consent to receive the Services by the Practitioner via telemedicine. I understand that the telemedicine visit will involve communicating with the Practitioner through live audiovisual communication  technology and the disclosure of certain medical information by electronic transmission. I acknowledge that I have been given the opportunity to request an in-person assessment or other available alternative prior to the telemedicine visit and am voluntarily participating in the telemedicine visit.  I understand that I have the right to withhold or withdraw my consent to the use of telemedicine in the course of my care at any time, without affecting my right to future care or treatment, and that the Practitioner or I may terminate the telemedicine visit at any time. I understand that I have the right to inspect all information obtained and/or recorded in the course of the telemedicine visit and may receive copies of available information for a reasonable fee.  I understand that some of the potential risks of receiving the Services via telemedicine include:  Marland Kitchen Delay or interruption in medical  evaluation due to technological equipment failure or disruption; . Information transmitted may not be sufficient (e.g. poor resolution of images) to allow for appropriate medical decision making by the Practitioner; and/or  . In rare instances, security protocols could fail, causing a breach of personal health information.  Furthermore, I acknowledge that it is my responsibility to provide information about my medical history, conditions and care that is complete and accurate to the best of my ability. I acknowledge that Practitioner's advice, recommendations, and/or decision may be based on factors not within their control, such as incomplete or inaccurate data provided by me or distortions of diagnostic images or specimens that may result from electronic transmissions. I understand that the practice of medicine is not an exact science and that Practitioner makes no warranties or guarantees regarding treatment outcomes. I acknowledge that I will receive a copy of this consent concurrently upon execution via email to the email address I last provided but may also request a printed copy by calling the office of [facility].    I understand that my insurance will be billed for this visit.   I have read or had this consent read to me. . I understand the contents of this consent, which adequately explains the benefits and risks of the Services being provided via telemedicine.  . I have been provided ample opportunity to ask questions regarding this consent and the Services and have had my questions answered to my satisfaction. . I give my informed consent for the services to be provided through the use of telemedicine in my medical care  By participating in this telemedicine visit I agree to the above.

## 2018-11-01 NOTE — Telephone Encounter (Signed)
Noted - [facility] is fine!

## 2018-11-01 NOTE — Telephone Encounter (Signed)
I returned call to [facility] with [facility]. [place] states [facility] is not going to be able to take this pt on. Gwinda Passe states the pt is out of their zip code as well as they would not be able to set up the 11/09/18 appt that Melina Copa, [facility] was requesting. I will forward this note to Melina Copa, [facility] for further recommendations. May have to send request to [facility], though I will confirm with PA first.

## 2018-11-01 NOTE — Telephone Encounter (Signed)
   See OV today. I have some concerns based on conversation today that patient may have some trouble managing his medications at home by himself. He has a daughter in [place] whom he speaks with every morning. It sounds like he uses a pillbox but was not entirely sure. In the hospital he was recently admitted with an MI the team felt could've possibly been due to not taking Eliquis as previously recommended. If patient will allow, can you reach out to daughter to relay the plan to involve [facility] team for medication safety visit, and just to make her aware there have been some medication compliance concerns recently? Thanks! Brian Brown

## 2018-11-01 NOTE — Telephone Encounter (Signed)
[place] Visit Initial Request  Date of Request ([place]):  November 01, 2018  Requesting Provider:  Melina Copa, [facility]    Agency Requested:    [facility] Contact:  Glory Buff, NP [address] [place], [place] [address] Phone #:  [phone] Fax #:  [phone]  Patient Demographic Information: Name:  Brian Brown Age:  85 y.o.   DOB:  October 18, 1932  MRN:  [id number]   Address:   [place] [address]   Phone Numbers:   Home Phone [phone]  Mobile [phone]     Emergency Contact Information on File:   Contact Information    Name Relation Home Work [place] Daughter [phone]        The above family members may be contacted for information on this patient (review DPR on file):  Yes    Patient Clinical Information:  Primary Care Provider:  Seward Carol, MD  Primary Cardiologist:  Larae Grooms, MD  Primary Electrophysiologist:  Will Meredith Leeds, MD   Past Medical Hx: Mr. Muse  has a past medical history of Arthritis, Bleeding stomach ulcer (25 yrs ago), CAD (coronary artery disease), Cancer ([place]) (2002), Cardiomyopathy ([place]), CKD (chronic kidney disease), stage II, Hepatitis (1981), Hypertension, Mild dilation of ascending aorta ([place]), PAF (paroxysmal atrial fibrillation) ([place]), Sleep apnea (2005), Stroke ([place]) (2003), Wears dentures, and Wears glasses.   Allergies: He has No Known Allergies.   Medications: Current Outpatient Medications on File Prior to Visit  Medication Sig  . apixaban (ELIQUIS) 5 MG TABS tablet Take 1 tablet (5 mg total) by mouth 2 (two) times daily.  Marland Kitchen atorvastatin (LIPITOR) 40 MG tablet Take 1 tablet (40 mg total) by mouth daily.  . clopidogrel (PLAVIX) 75 MG tablet Take 75 mg by mouth daily with breakfast.  . furosemide (LASIX) 20 MG tablet Take 20 mg by mouth daily as needed for fluid.   . isosorbide mononitrate (IMDUR) 30 MG 24 hr tablet  Take 1 tablet (30 mg total) by mouth daily.  Marland Kitchen lisinopril (ZESTRIL) 10 MG tablet Take 1 tablet (10 mg total) by mouth daily.  . metoprolol tartrate (LOPRESSOR) 25 MG tablet Take 50 mg by mouth 2 (two) times daily.  . nitroGLYCERIN (NITROSTAT) 0.4 MG SL tablet Place 1 tablet (0.4 mg total) under the tongue every 5 (five) minutes x 3 doses as needed for chest pain.  . Omega 3-6-9 Fatty Acids (OMEGA 3-6-9 COMPLEX PO) Take 2-3 capsules by mouth See admin instructions. 3 caps in the morning, and 2 caps in the evening   No current facility-administered medications on file prior to visit.      Social Hx: He  reports that he quit smoking about 10 years ago. His smoking use included cigarettes. He has a 30.00 pack-year smoking history. He has never used smokeless tobacco. He reports that he does not drink alcohol or use drugs.    Diagnosis/Reason for Visit:   FATIGUE, CAD, A-FIB, CARDIOMYOPATHY  Services Requested:  Vital Signs (BP, Pulse, O2, Weight)  Physical Exam  Medication Reconciliation  # of Visits Needed/Frequency per Week: 2 VISITS TO START OFF WITH AT THIS TIME  1ST VISIT ASAP FOR MEDICATION SAFETY  2ND VISIT, IF POSSIBLE, 11/09/18 10:15 (THIS WILL CORRELATE WITH VIRTUAL APPT WITH DAYNA DUNN, PAC). IF THIS IS NOT POSSIBLE PLEASE LET OUR OFFICE KNOW. [place] !!

## 2018-11-01 NOTE — Telephone Encounter (Signed)
[place] Visit Initial Request  Date of Request ([place]):  November 01, 2018  Requesting Provider:  Melina Brown, [facility]    Agency Requested:    [facility] Contact:  Brian Brown, [place], [address], [place], [place]  [id number] [email or web address]  Phone #: [phone] Fax #: [phone]  Patient Demographic Information: Name:  RAEES Brown Age:  85 y.o.   DOB:  09-21-1932  MRN:  [id number]   Address:   [place] [address]   Phone Numbers:   Home Phone [phone]  Mobile [phone]     Emergency Contact Information on File:   Contact Information    Name Relation Home Work Brian Brown Daughter [phone]        The above family members may be contacted for information on this patient (review DPR on file):  Yes    Patient Clinical Information:  Primary Care Provider:  Seward Carol, MD  Primary Cardiologist:  Brian Grooms, MD  Primary Electrophysiologist:  Brian Meredith Leeds, MD   Past Medical Hx: Brian Brown  has a past medical history of Arthritis, Bleeding stomach ulcer (25 yrs ago), CAD (coronary artery disease), Cancer ([place]) (2002), Cardiomyopathy ([place]), CKD (chronic kidney disease), stage II, Hepatitis (1981), Hypertension, Mild dilation of ascending aorta ([place]), PAF (paroxysmal atrial fibrillation) ([place]), Sleep apnea (2005), Stroke ([place]) (2003), Wears dentures, and Wears glasses.   Allergies: He has No Known Allergies.   Medications: Current Outpatient Medications on File Prior to Visit  Medication Sig  . apixaban (ELIQUIS) 5 MG TABS tablet Take 1 tablet (5 mg total) by mouth 2 (two) times daily.  Marland Kitchen atorvastatin (LIPITOR) 40 MG tablet Take 1 tablet (40 mg total) by mouth daily.  . clopidogrel (PLAVIX) 75 MG tablet Take 75 mg by mouth daily with breakfast.  . furosemide (LASIX) 20 MG tablet Take 20 mg by mouth daily as needed for fluid.   . isosorbide  mononitrate (IMDUR) 30 MG 24 hr tablet Take 1 tablet (30 mg total) by mouth daily.  Marland Kitchen lisinopril (ZESTRIL) 10 MG tablet Take 1 tablet (10 mg total) by mouth daily.  . metoprolol tartrate (LOPRESSOR) 25 MG tablet Take 50 mg by mouth 2 (two) times daily.  . nitroGLYCERIN (NITROSTAT) 0.4 MG SL tablet Place 1 tablet (0.4 mg total) under the tongue every 5 (five) minutes x 3 doses as needed for chest pain.  . Omega 3-6-9 Fatty Acids (OMEGA 3-6-9 COMPLEX PO) Take 2-3 capsules by mouth See admin instructions. 3 caps in the morning, and 2 caps in the evening   No current facility-administered medications on file prior to visit.      Social Hx: He  reports that he quit smoking about 10 years ago. His smoking use included cigarettes. He has a 30.00 pack-year smoking history. He has never used smokeless tobacco. He reports that he does not drink alcohol or use drugs.    Diagnosis/Reason for Visit:   CAD, FATIGUE, A-FIB, CARDIOMYOPATHY  Services Requested:  Vital Signs (BP, Pulse, O2, Weight)  Physical Exam  Medication Reconciliation  # of Visits Needed/Frequency per Week: 2 VISITS TO START OFF WITH AT THIS TIME  1ST VISIT ASAP FOR MEDICATION SAFETY  2ND VISIT, IF POSSIBLE, 11/09/18 @ 10:15 (THIS Brian CORRELATE WITH THE VIRTUAL APPT WITH DAYNA DUNN, PAC). IF THIS IS NOT POSSIBLE PLEASE LET OUR OFFICE KNOW. [place]

## 2018-11-01 NOTE — Telephone Encounter (Signed)
Contacted pt's daughter, [name], [place] on file, at the request of Melina Copa, PA-C. [name] is ok with having [facility] go out to pt's house for medication safety. She has been made aware that we have asked for 2 visits at the start, but could change, depending upon the assessment.  She did thank me for letting her know.

## 2018-11-02 LAB — BASIC METABOLIC PANEL
BUN/Creatinine Ratio: 15 (ref 10–24)
BUN: 17 mg/dL (ref 8–27)
CO2: 24 mmol/L (ref 20–29)
Calcium: 8.7 mg/dL (ref 8.6–10.2)
Chloride: 104 mmol/L (ref 96–106)
Creatinine, Ser: 1.14 mg/dL (ref 0.76–1.27)
GFR calc Af Amer: 68 mL/min/{1.73_m2} (ref >59)
GFR calc non Af Amer: 59 mL/min/{1.73_m2} — ABNORMAL LOW (ref >59)
Glucose: 88 mg/dL (ref 65–99)
Potassium: 4 mmol/L (ref 3.5–5.2)
Sodium: 140 mmol/L (ref 134–144)

## 2018-11-02 LAB — CBC
Hematocrit: 38.7 % (ref 37.5–51.0)
Hemoglobin: 13 g/dL (ref 13.0–17.7)
MCH: 29.2 pg (ref 26.6–33.0)
MCHC: 33.6 g/dL (ref 31.5–35.7)
MCV: 87 fL (ref 79–97)
Platelets: 259 10*3/uL (ref 150–450)
RBC: 4.45 x10E6/uL (ref 4.14–5.80)
RDW: 12.8 % (ref 11.6–15.4)
WBC: 4.4 10*3/uL (ref 3.4–10.8)

## 2018-11-02 LAB — TSH: TSH: 1.63 u[IU]/mL (ref 0.450–4.500)

## 2018-11-04 DIAGNOSIS — I63531 Cerebral infarction due to unspecified occlusion or stenosis of right posterior cerebral artery: Secondary | ICD-10-CM | POA: Diagnosis not present

## 2018-11-04 DIAGNOSIS — M1990 Unspecified osteoarthritis, unspecified site: Secondary | ICD-10-CM | POA: Diagnosis not present

## 2018-11-04 DIAGNOSIS — E7800 Pure hypercholesterolemia, unspecified: Secondary | ICD-10-CM | POA: Diagnosis not present

## 2018-11-04 DIAGNOSIS — I10 Essential (primary) hypertension: Secondary | ICD-10-CM | POA: Diagnosis not present

## 2018-11-04 DIAGNOSIS — I6789 Other cerebrovascular disease: Secondary | ICD-10-CM | POA: Diagnosis not present

## 2018-11-04 DIAGNOSIS — E785 Hyperlipidemia, unspecified: Secondary | ICD-10-CM | POA: Diagnosis not present

## 2018-11-04 DIAGNOSIS — Z8546 Personal history of malignant neoplasm of prostate: Secondary | ICD-10-CM | POA: Diagnosis not present

## 2018-11-04 DIAGNOSIS — I509 Heart failure, unspecified: Secondary | ICD-10-CM | POA: Diagnosis not present

## 2018-11-04 DIAGNOSIS — I639 Cerebral infarction, unspecified: Secondary | ICD-10-CM | POA: Diagnosis not present

## 2018-11-07 ENCOUNTER — Other Ambulatory Visit: Payer: Self-pay

## 2018-11-07 DIAGNOSIS — Z20822 Contact with and (suspected) exposure to covid-19: Secondary | ICD-10-CM

## 2018-11-07 NOTE — Progress Notes (Addendum)
Virtual Visit via Telephone Note   This visit type was conducted due to national recommendations for restrictions regarding the COVID-19 Pandemic (e.g. social distancing) in an effort to limit this patient's exposure and mitigate transmission in our community.  Due to his co-morbid illnesses, this patient is at least at moderate risk for complications without adequate follow up.  This format is felt to be most appropriate for this patient at this time.  The patient did not have access to video technology/had technical difficulties with video requiring transitioning to audio format only (telephone).  All issues noted in this document were discussed and addressed.  No physical exam could be performed with this format. At last OV he verbally consented to going forward with this telehealth visit [place]. The patient was offered a virtual versus in person office visit in an effort to limit physical exposure during the Covid-19 pandemic and they elected to proceed with virtual visit.  Date:  11/09/2018   ID:  Brian Brown, DOB [birth year], MRN [id number]  Patient Location: Home Provider Location: Home  PCP:  Seward Carol, MD  Cardiologist:  Larae Grooms, MD  Electrophysiologist:  Constance Haw, MD   Evaluation Performed:  Follow-Up Visit  Chief Complaint: f/u medication reconciliation   History of Present Illness:    Brian Brown is a 85 y.o. male with CVA 02/2018 with PAF confirmed on loop recorder, obstructive sleep apnea (not on CPAP), recently diagnosed CAD with NSTEMI treated medically as outlined below, mild cardiomyopathy (EF 45-50%), hypertension, hyperlipidemia, CKD II, mild dilation of ascending aorta, and prostate CA who is seen virtually for medication follow-up.  In 02/2018 he sustained an embolic stroke with left hemianopia. It was recommended that he have a TEE as an outpatient followed by a loop monitor implant. 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 He subsequently underwent placement of loop recorder. Ultimately this did demonstrate paroxysms of atrial fibrillation. Therefore on 10/13/2018 during a clinic visit with Dr. Curt Bears it was suggested that he stop Plavix and start taking Eliquis. The patient unfortunately did not enact this change as recommended for unclear reasons.  He was recently admitted 10/13-10/15/20 with chest pain and NSTEMI (peak troponin 7719). CTA was negative for PE and showed clear lungs despite some ill defined opacities on CXR. He underwent cardiac cath with severe single-vessel disease with subtotal thrombotic occlusion of distal OM2. The vessel was felt not to be suitable for PCI given trifurcation and was very torturous. Recommendation was for medical therapy with Plavix given thrombotic lesion. He otherwise had nonobstructive disease in other vessels. 2D echo 10/19/18 showed EF 45-50%, mild LVH, + WMA, impaired diastolic filling, normal RV function, mild LAE/RAE, mild dilation of ascending aorta. In further talking with the patient it appeared that he never filled the original Eliquis prescription. The team speculated it was also possible that he could have had an intracardial thrombus that embolized to his OM so Eliquis was added. Aspirin was not used given need for Plavix + Eliquis and no PCI. His home Diltiazem was stopped given slight reduction in his EF and he was started on lisinopril 10mg  daily. Imdur 30 mg daily was added post cath. Home dose Crestor was switched to atorvastatin 40 mg secondary to lower cost for the patient. Per monitor phone report 10/24/18, metoprolol was increased to 2  tablets (50mg ) BID for paroxysms of rapid atrial fib (12.9% AF burden).  At f/u 11/01/18 he was feeling well but reported mild  generalized fatigue. He had no other localized symptoms, was afebrile without cough, URI symptoms or any other focal complaints. He was clinically in NSR in the 60s with stable vitals so was asked to touch base with his PCP. He has a daughter in [place] with whom he speaks on the phone every morning and assists with his medicines.  The initial plan was to deploy one of our home health partners to assist with a f/u visit for medication reconciliation for today. However, in the interim the patient was speaking with his daughters who reported numerous colleagues in their own offices have recently tested positive for Covid. His daughters had visited when he was in the hospital. He went to get tested as a precaution on 11/07/18 and came up positive for Covid-19 infection. See result note. This has been discussed with him by team managing this testing. He thinks he might have had a fever last night but did not check it - just felt warmer than normal. Overall though he reports he's doing quite well and actually has more energy than last week. No CP, SOB, orthopnea, palpitations, edema. The only other symptom he's noticed is that his intestines are noisier but no acute abdominal symptoms. Very mild intermittent cough. Last labs 11/01/18 showed normal TSH, CBC, Cr 1.14 (stable), K 4.0; 02/2018 AST/ALT OK, LDL 62.   We walked through his pill bottles one by one and I now feel more reassured that he is taking what he is supposed to. He had all the correct doses and medications, and did not have any residual medications that were previously stopped. He also reminded me that he was told by EP to take 2 of the metoprolol twice a day without me prompting him.  Past Medical History:  Diagnosis Date   Arthritis    degenerative joint disease   Bleeding stomach ulcer 25 yrs ago   CAD (coronary artery disease)    a. NSTEMI 10/2018 thrombotic lesion to OM2, 90% D1 (medical therapy), otherwise nonobstructive disease.    Cancer [facility]) 2002   prostate, s/p surgery   Cardiomyopathy ([place])    a. EF 45-50 by echo 10/2018.   CKD (chronic kidney disease), stage II    Hepatitis 1981   does not know what kind;denies jaundice   Hypertension    treated by Dr Delfina Redwood, primary MD   Mild dilation of ascending aorta ([place])    PAF (paroxysmal atrial fibrillation) ([place])    Sleep apnea 2005   does not wear a Cpap   Stroke ([place]) 2003   w/ Rt sided weakness-some-rt leg   Wears dentures    full top   Wears glasses    Past Surgical History:  Procedure Laterality Date   BUBBLE STUDY  09/16/2018   Procedure: BUBBLE STUDY;  Surgeon: Lelon Perla, MD;  Location: [place];  Service: Cardiovascular;;   CARDIAC CATHETERIZATION  10/19/2018   CARPAL TUNNEL RELEASE Left 03/29/2013   Procedure: LEFT CARPAL TUNNEL RELEASE;  Surgeon: Wynonia Sours, MD;  Location: [place];  Service: Orthopedics;  Laterality: Left;  ANESTHESIA: IV REGIONAL FAB   CARPAL TUNNEL RELEASE Right 03/06/2014   Procedure: RIGHT CARPAL TUNNEL RELEASE;  Surgeon: Daryll Brod, MD;  Location: [place];  Service: Orthopedics;  Laterality: Right;   COLONOSCOPY     HERNIA REPAIR  2009  rt inguinal hernia   JOINT REPLACEMENT  12/2001   rt hip ;[place] CATH AND CORONARY ANGIOGRAPHY N/A 10/19/2018   Procedure: LEFT HEART CATH AND CORONARY ANGIOGRAPHY;  Surgeon: Wellington Hampshire, MD;  Location: [place] CV [facility];  Service: Cardiovascular;  Laterality: N/A;   LOOP RECORDER INSERTION N/A 09/16/2018   Procedure: LOOP RECORDER INSERTION;  Surgeon: Constance Haw, MD;  Location: [place] [facility];  Service: Cardiovascular;  Laterality: N/A;   PROSTATECTOMY  2002   SHOULDER ARTHROSCOPY W/ ROTATOR CUFF REPAIR  2014   lt-dr whitfield   TEE WITHOUT CARDIOVERSION N/A 09/16/2018   Procedure: TRANSESOPHAGEAL ECHOCARDIOGRAM (TEE);  Surgeon: Lelon Perla, MD;  Location[facility] ENDOSCOPY;   Service: Cardiovascular;  Laterality: N/A;   TOTAL HIP ARTHROPLASTY  02/03/2011   Procedure: TOTAL HIP ARTHROPLASTY;  Surgeon: Garald Balding, MD;  Location: [place];  Service: Orthopedics;  Laterality: Left;   TRIGGER FINGER RELEASE Left 03/29/2013   Procedure: LEFT MIDDLE/SMALL FINGER RELEASE A-1 PULLEY AND LEFT RING FINGER;  Surgeon: Wynonia Sours, MD;  Location: [place];  Service: Orthopedics;  Laterality: Left;   TRIGGER FINGER RELEASE Right 03/06/2014   Procedure: RIGHT MIDDLE FINGER,RIGHT RINGER FINGER;  Surgeon: Daryll Brod, MD;  Location: [place];  Service: Orthopedics;  Laterality: Right;     Current Meds  Medication Sig   apixaban (ELIQUIS) 5 MG TABS tablet Take 1 tablet (5 mg total) by mouth 2 (two) times daily.   atorvastatin (LIPITOR) 40 MG tablet Take 1 tablet (40 mg total) by mouth daily.   clopidogrel (PLAVIX) 75 MG tablet Take 75 mg by mouth daily with breakfast.   furosemide (LASIX) 20 MG tablet Take 20 mg by mouth daily as needed for fluid.    isosorbide mononitrate (IMDUR) 30 MG 24 hr tablet Take 1 tablet (30 mg total) by mouth daily.   lisinopril (ZESTRIL) 10 MG tablet Take 1 tablet (10 mg total) by mouth daily.   metoprolol tartrate (LOPRESSOR) 25 MG tablet Take 2 tablets (50 mg total) by mouth 2 (two) times daily.   nitroGLYCERIN (NITROSTAT) 0.4 MG SL tablet Place 1 tablet (0.4 mg total) under the tongue every 5 (five) minutes x 3 doses as needed for chest pain.   Omega 3-6-9 Fatty Acids (OMEGA 3-6-9 COMPLEX PO) Take 2-3 capsules by mouth See admin instructions. 3 caps in the morning, and 2 caps in the evening   [DISCONTINUED] metoprolol tartrate (LOPRESSOR) 25 MG tablet Take 50 mg by mouth 2 (two) times daily.     Allergies:   Patient has no known allergies.   Social History   Tobacco Use   Smoking status: Former Smoker    Packs/day: 1.00    Years: 30.00    Pack years: 30.00    Types: Cigarettes    Quit date:  01/27/2008    Years since quitting: 10.7   Smokeless tobacco: Never Used  Substance Use Topics   Alcohol use: No   Drug use: No     Family Hx: The patient's family history includes Heart disease in his father and mother.  ROS:   Please see the history of present illness.    All other systems reviewed and are negative.   Prior CV studies:    Most recent pertinent cardiac studies are outlined above.  Labs/Other Tests and Data Reviewed:    EKG:  An ECG dated 10/20/18 was personally reviewed today and demonstrated:  NSR 69bpm,  baseline wander, nonspecific TW changes  Recent Labs: 02/13/2018: ALT 15 10/18/2018: B Natriuretic Peptide 204.1 11/01/2018: BUN 17; Creatinine, Ser 1.14; Hemoglobin 13.0; Platelets 259; Potassium 4.0; Sodium 140; TSH 1.630   Recent Lipid Panel Lab Results  Component Value Date/Time   CHOL 117 02/14/2018 03:03 AM   TRIG 48 02/14/2018 03:03 AM   HDL 45 02/14/2018 03:03 AM   CHOLHDL 2.6 02/14/2018 03:03 AM   LDLCALC 62 02/14/2018 03:03 AM    Wt Readings from Last 3 Encounters:  11/09/18 180 lb (81.6 kg)  11/01/18 181 lb 6.4 oz (82.3 kg)  10/20/18 175 lb 8 oz (79.6 kg)     Objective:    Vital Signs:  Ht 5\' 9"  (1.753 m)    Wt 180 lb (81.6 kg)    BMI 26.58 kg/m    VS reviewed. General - calm M in no acute distress Pulm - No labored breathing, no coughing during visit, no audible wheezing, speaking in full sentences Neuro - A+Ox3, no slurred speech, answers questions appropriately Psych - Pleasant affect     ASSESSMENT & PLAN:    1. Generalized fatigue - originally I wondered if this was related to beta blocker increase. It still could be the case but now that he has tested positive for Covid I also wonder to what degree this was contributing. He has not really had a lot of other localized symptoms and was afebrile in the office that day as well. All staff were wearing appropriate PPE and patient was masked as well. He is overall feeling better  so I would not make any changes today. VSS were stable at recent OV. He has a BP cuff at home but has not engaged with it yet. He will notify us for any abnormalities in his vitals. Based on our conversation today with his pill bottles in front of him, I have much more confidence in his ability to navigate his med rec safely. His daughter from [place] also calls daily to check on him. The home health partners cannot engage with him while he has Covid-19 so will hold off on this referral for now, especially in light of reassuring medication review today. We can consider revisiting this when he has recovered. He will notify us if he has any concerns. 2. CAD - no recurrent angina. He is on Plavix as above. Not on ASA due to need to avoid triple therapy. Continue current regimen. 3. Paroxysmal atrial fibrillation - AF burden followed by ILR. He is on the correct dose of Eliquis. Bleeding precautions reviewed. We discussed how to handle his metoprolol dose - either stay at 25mg  tablets taking 2, or change to a 50mg  tablet. For now we decided to stay consistent so as not to confuse him with taking 2 25mg  tablets twice a day. Will send in updated rx to his pharmacy so they are aware of the change. 4. Mild cardiomyopathy - appeared euvolemic at last OV. No worrisome symptoms today. 5. Mild dilation of ascending aorta - will be due for f/u 10/2019. Will defer to primary cardiologist if appropriate to arrange echo closer to that time. 6. Covid-19 infection - this has been reviewed with him per result note. I also encouraged him to call his PCP's office to arrange virtual follow-up with them as well so that they are in the loop.    COVID-19 Education: Patient received education on this already (see 11/4 result note on 11/2 covid test).  Time:   Today, I have spent  17 minutes with the patient with telehealth technology discussing the above problems.     Medication Adjustments/Labs and Tests Ordered: Current  medicines are reviewed at length with the patient today.  Concerns regarding medicines are outlined above.   Disposition:  Follow up in 3 months in the office with me.  Signed, Charlie Pitter, PA-C  11/09/2018 10:56 AM    [place]

## 2018-11-08 LAB — NOVEL CORONAVIRUS, NAA: SARS-CoV-2, NAA: DETECTED — AB

## 2018-11-09 ENCOUNTER — Telehealth (INDEPENDENT_AMBULATORY_CARE_PROVIDER_SITE_OTHER): Payer: Medicare Other | Admitting: Physician Assistant

## 2018-11-09 ENCOUNTER — Encounter: Payer: Self-pay | Admitting: Physician Assistant

## 2018-11-09 ENCOUNTER — Telehealth: Payer: Self-pay | Admitting: *Deleted

## 2018-11-09 ENCOUNTER — Other Ambulatory Visit: Payer: Self-pay | Admitting: Interventional Cardiology

## 2018-11-09 ENCOUNTER — Other Ambulatory Visit: Payer: Self-pay

## 2018-11-09 VITALS — Ht 69.0 in | Wt 180.0 lb

## 2018-11-09 DIAGNOSIS — R5383 Other fatigue: Secondary | ICD-10-CM | POA: Diagnosis not present

## 2018-11-09 DIAGNOSIS — I480 Paroxysmal atrial fibrillation: Secondary | ICD-10-CM

## 2018-11-09 DIAGNOSIS — I2510 Atherosclerotic heart disease of native coronary artery without angina pectoris: Secondary | ICD-10-CM

## 2018-11-09 DIAGNOSIS — I77810 Thoracic aortic ectasia: Secondary | ICD-10-CM

## 2018-11-09 DIAGNOSIS — I429 Cardiomyopathy, unspecified: Secondary | ICD-10-CM

## 2018-11-09 DIAGNOSIS — U071 COVID-19: Secondary | ICD-10-CM

## 2018-11-09 MED ORDER — METOPROLOL TARTRATE 25 MG PO TABS: 50.0000 mg | ORAL_TABLET | Freq: Two times a day (BID) | ORAL | 6 refills | Status: DC

## 2018-11-09 MED ORDER — ATORVASTATIN CALCIUM 40 MG PO TABS: 40.0000 mg | ORAL_TABLET | Freq: Every day | ORAL | 11 refills | Status: DC

## 2018-11-09 NOTE — Telephone Encounter (Signed)
-----   Message from Jeanann Lewandowsky, [place] sent at 11/09/2018 10:56 AM EST ----- Regarding: FW: Orders 11/4 See below and cancel the [facility] order ----- Message ----- From: Charlie Pitter, PA-C Sent: 11/09/2018  10:45 AM EST To: Jeanann Lewandowsky, RMA Subject: Orders 11/4                                    He seems to have a better handle on his meds with them in front of him than he did in recent office visit.He tested + for covid on 11/2. His daughters had visited him a few weeks ago when he was in the hospital and he thinks he caught it from them... which means he was potentially infectious in our office but no way to really know. He had no focal symptoms when we saw him and no cough so our exposure was hopefully low. We originally had planned to have [facility] then [facility] out for assistance with med rec but given today's visit let's cancel that request in setting of Covid. Can send this message to Arbie Cookey to reach out. He will call us if he feels he needs med assistance.  EP increased his metoprolol from 25mg  BID to 2 tabs BID which he is very clear that he is taking. However, he will run out of them early based on the increase. I talked to him about a 50mg  tab but he seems to have a good grasp on his meds currently so we talked about just leaving him at Lopressor 25mg  taking 2 tabs BID - can you send in updated rx to pharmacy on file?  Please arrange in office OV with me in 3 months and call him with this information  I also recommended he touch base with PCP about his covid diagnosis

## 2018-11-09 NOTE — Patient Instructions (Signed)
Medication Instructions:  Your physician recommends that you continue on your current medications as directed. Please refer to the Current Medication list given to you today.  *If you need a refill on your cardiac medications before your next appointment, please call your pharmacy*  Lab Work: None ordered  If you have labs (blood work) drawn today and your tests are completely normal, you will receive your results only by: Marland Kitchen MyChart Message (if you have MyChart) OR . A paper copy in the mail If you have any lab test that is abnormal or we need to change your treatment, we will call you to review the results.  Testing/Procedures: None ordered  Follow-Up: At [facility], you and your health needs are our priority.  As part of our continuing mission to provide you with exceptional heart care, we have created designated Provider Care Teams.  These Care Teams include your primary Cardiologist (physician) and Advanced Practice Providers (APPs -  Physician Assistants and Nurse Practitioners) who all work together to provide you with the care you need, when you need it.  Your next appointment:   3 months  I WILL CALL YOU WITH THIS APPOINTMENT ONCE DAYNA'S SCHEDULE OPENS UP   The format for your next appointment:   In Person  Provider:   Melina Copa, PA-C  Other Instructions

## 2018-11-09 NOTE — Telephone Encounter (Signed)
I s/w Tiffany with [facility]. I advised of recommendation per Melina Copa, PAC ok to cancel Home Visit, pt is doing better, see notes from Melina Copa, [place]. Tiffany thanked me for the call and the update.

## 2018-11-14 NOTE — Telephone Encounter (Signed)
I s/w Tiffany at [place] on 11/09/18 and discussed with the recommendation per Melina Copa, [facility] to cancel the Home Visit Request.

## 2018-11-14 NOTE — Progress Notes (Signed)
Carelink Summary Report / Loop Recorder 

## 2018-11-15 ENCOUNTER — Other Ambulatory Visit: Payer: Self-pay | Admitting: Physician Assistant

## 2018-11-18 DIAGNOSIS — I480 Paroxysmal atrial fibrillation: Secondary | ICD-10-CM | POA: Diagnosis not present

## 2018-11-18 DIAGNOSIS — I509 Heart failure, unspecified: Secondary | ICD-10-CM | POA: Diagnosis not present

## 2018-11-18 DIAGNOSIS — I77810 Thoracic aortic ectasia: Secondary | ICD-10-CM | POA: Diagnosis not present

## 2018-11-18 DIAGNOSIS — I10 Essential (primary) hypertension: Secondary | ICD-10-CM | POA: Diagnosis not present

## 2018-11-18 DIAGNOSIS — I25810 Atherosclerosis of coronary artery bypass graft(s) without angina pectoris: Secondary | ICD-10-CM | POA: Diagnosis not present

## 2018-11-18 DIAGNOSIS — E7800 Pure hypercholesterolemia, unspecified: Secondary | ICD-10-CM | POA: Diagnosis not present

## 2018-11-18 DIAGNOSIS — I63531 Cerebral infarction due to unspecified occlusion or stenosis of right posterior cerebral artery: Secondary | ICD-10-CM | POA: Diagnosis not present

## 2018-11-18 DIAGNOSIS — I214 Non-ST elevation (NSTEMI) myocardial infarction: Secondary | ICD-10-CM | POA: Diagnosis not present

## 2018-11-22 ENCOUNTER — Other Ambulatory Visit: Payer: Self-pay | Admitting: Interventional Cardiology

## 2018-11-22 MED ORDER — CLOPIDOGREL BISULFATE 75 MG PO TABS: 75.0000 mg | ORAL_TABLET | Freq: Every day | ORAL | 3 refills | Status: DC

## 2018-11-22 MED ORDER — NITROGLYCERIN 0.4 MG SL SUBL: 0.4000 mg | SUBLINGUAL_TABLET | SUBLINGUAL | 6 refills | Status: AC | PRN

## 2018-11-22 MED ORDER — APIXABAN 5 MG PO TABS: 5.0000 mg | ORAL_TABLET | Freq: Two times a day (BID) | ORAL | 11 refills | Status: DC

## 2018-11-22 MED ORDER — FUROSEMIDE 20 MG PO TABS: 20.0000 mg | ORAL_TABLET | Freq: Every day | ORAL | 11 refills | Status: DC | PRN

## 2018-11-22 MED ORDER — ISOSORBIDE MONONITRATE ER 30 MG PO TB24: 30.0000 mg | ORAL_TABLET | Freq: Every day | ORAL | 3 refills | Status: DC

## 2018-11-22 MED ORDER — LISINOPRIL 10 MG PO TABS: 10.0000 mg | ORAL_TABLET | Freq: Every day | ORAL | 11 refills | Status: DC

## 2018-11-22 NOTE — Telephone Encounter (Signed)
Eliquis 5mg  refill request received. Pt is 85yrs old, weight-81.6kg, Crea-1.14 on 11/01/2018, Diagnosis-Afib, and last seen by Melina Copa on 11/09/2018 via Telemedicine. Dose is appropriate based on dosing criteria. Will send in refill to requested pharmacy.

## 2018-11-25 ENCOUNTER — Ambulatory Visit (INDEPENDENT_AMBULATORY_CARE_PROVIDER_SITE_OTHER): Payer: Medicare Other | Admitting: *Deleted

## 2018-11-25 DIAGNOSIS — I480 Paroxysmal atrial fibrillation: Secondary | ICD-10-CM

## 2018-11-25 DIAGNOSIS — I639 Cerebral infarction, unspecified: Secondary | ICD-10-CM

## 2018-11-27 LAB — CUP PACEART REMOTE DEVICE CHECK
Date Time Interrogation Session: 20201120212055
Implantable Pulse Generator Implant Date: 20200911

## 2018-11-29 DIAGNOSIS — H53462 Homonymous bilateral field defects, left side: Secondary | ICD-10-CM | POA: Diagnosis not present

## 2018-11-29 DIAGNOSIS — I509 Heart failure, unspecified: Secondary | ICD-10-CM | POA: Diagnosis not present

## 2018-11-29 DIAGNOSIS — I6789 Other cerebrovascular disease: Secondary | ICD-10-CM | POA: Diagnosis not present

## 2018-11-29 DIAGNOSIS — M1990 Unspecified osteoarthritis, unspecified site: Secondary | ICD-10-CM | POA: Diagnosis not present

## 2018-11-29 DIAGNOSIS — Z8546 Personal history of malignant neoplasm of prostate: Secondary | ICD-10-CM | POA: Diagnosis not present

## 2018-11-29 DIAGNOSIS — H401134 Primary open-angle glaucoma, bilateral, indeterminate stage: Secondary | ICD-10-CM | POA: Diagnosis not present

## 2018-11-29 DIAGNOSIS — E785 Hyperlipidemia, unspecified: Secondary | ICD-10-CM | POA: Diagnosis not present

## 2018-11-29 DIAGNOSIS — I63531 Cerebral infarction due to unspecified occlusion or stenosis of right posterior cerebral artery: Secondary | ICD-10-CM | POA: Diagnosis not present

## 2018-11-29 DIAGNOSIS — I10 Essential (primary) hypertension: Secondary | ICD-10-CM | POA: Diagnosis not present

## 2018-11-29 DIAGNOSIS — I25810 Atherosclerosis of coronary artery bypass graft(s) without angina pectoris: Secondary | ICD-10-CM | POA: Diagnosis not present

## 2018-11-29 DIAGNOSIS — I639 Cerebral infarction, unspecified: Secondary | ICD-10-CM | POA: Diagnosis not present

## 2018-11-29 DIAGNOSIS — I480 Paroxysmal atrial fibrillation: Secondary | ICD-10-CM | POA: Diagnosis not present

## 2018-11-29 DIAGNOSIS — I214 Non-ST elevation (NSTEMI) myocardial infarction: Secondary | ICD-10-CM | POA: Diagnosis not present

## 2018-11-29 DIAGNOSIS — E7800 Pure hypercholesterolemia, unspecified: Secondary | ICD-10-CM | POA: Diagnosis not present

## 2018-12-26 NOTE — Progress Notes (Signed)
Carelink Summary Report / Loop Recorder 

## 2018-12-28 ENCOUNTER — Ambulatory Visit (INDEPENDENT_AMBULATORY_CARE_PROVIDER_SITE_OTHER): Payer: Medicare Other | Admitting: *Deleted

## 2018-12-28 DIAGNOSIS — I480 Paroxysmal atrial fibrillation: Secondary | ICD-10-CM | POA: Diagnosis not present

## 2018-12-29 LAB — CUP PACEART REMOTE DEVICE CHECK
Date Time Interrogation Session: 20201224071858
Implantable Pulse Generator Implant Date: 20200911

## 2019-01-11 ENCOUNTER — Encounter: Payer: Self-pay | Admitting: Adult Health

## 2019-01-11 ENCOUNTER — Ambulatory Visit (INDEPENDENT_AMBULATORY_CARE_PROVIDER_SITE_OTHER): Payer: Medicare HMO | Admitting: Adult Health

## 2019-01-11 ENCOUNTER — Other Ambulatory Visit: Payer: Self-pay

## 2019-01-11 VITALS — BP 110/74 | HR 69 | Temp 97.3°F | Ht 69.0 in | Wt 175.8 lb

## 2019-01-11 DIAGNOSIS — R0600 Dyspnea, unspecified: Secondary | ICD-10-CM

## 2019-01-11 DIAGNOSIS — I63431 Cerebral infarction due to embolism of right posterior cerebral artery: Secondary | ICD-10-CM | POA: Diagnosis not present

## 2019-01-11 DIAGNOSIS — I480 Paroxysmal atrial fibrillation: Secondary | ICD-10-CM

## 2019-01-11 DIAGNOSIS — I10 Essential (primary) hypertension: Secondary | ICD-10-CM | POA: Diagnosis not present

## 2019-01-11 DIAGNOSIS — E785 Hyperlipidemia, unspecified: Secondary | ICD-10-CM

## 2019-01-11 DIAGNOSIS — R0609 Other forms of dyspnea: Secondary | ICD-10-CM

## 2019-01-11 DIAGNOSIS — H53462 Homonymous bilateral field defects, left side: Secondary | ICD-10-CM | POA: Diagnosis not present

## 2019-01-11 NOTE — Patient Instructions (Signed)
Continue clopidogrel 75 mg daily and Eliquis (apixaban) daily  and lipitor  for secondary stroke prevention  Continue to follow up with PCP regarding cholesterol and blood pressure management   Continue to follow with cardiology as scheduled  Continue to follow with Dr. Katy Fitch as scheduled and we will also request your last office visit notes -you also need to follow-up with him in regards to driving as he will be the one to clear you in regards to driving safety  Continue to monitor blood pressure at home  Maintain strict control of hypertension with blood pressure goal below 130/90, diabetes with hemoglobin A1c goal below 6.5% and cholesterol with LDL cholesterol (bad cholesterol) goal below 70 mg/dL. I also advised the patient to eat a healthy diet with plenty of whole grains, cereals, fruits and vegetables, exercise regularly and maintain ideal body weight.  Followup in the future with me in 6 months or call earlier if needed       Thank you for coming to see [place] at [facility]. I hope we have been able to provide you high quality care today.  You may receive a patient satisfaction survey over the next few weeks. We would appreciate your feedback and comments so that we may continue to improve ourselves and the health of our patients.

## 2019-01-11 NOTE — Progress Notes (Signed)
[facility] [address] [place] [address] [phone]       OFFICE FOLLOW UP NOTE  [name] Date of Birth:  Jan 02, 1933 Medical Record Number:  [id number]   Reason for Referral: stroke follow up    CHIEF COMPLAINT:  Chief Complaint  Patient presents with  . Follow-up    6 mon f/u. Alone. Rm 9. No new concerns at this time.     HPI: Stroke admission 02/13/2018: Brian Brown is a 86 y.o. male with history of HTN and CVA presenting with visual change (L hemianopia).  Stroke work-up revealed right PCA infarct embolic pattern secondary to undetermined source.  MRA head normal.  MRA neck showed generalized dolichoectasia and mild stenosis left P2.  2D echo and lower extremity venous Doppler negative.  Recommended TEE outpatient and possible need of loop recorder placement to assess for underlying atrial fibrillation as potential cause of stroke.  LDL 62 and A1c 5.8.  Recommended DAPT for 3 weeks and Plavix alone.  Recommended continuation of Crestor 10 mg daily for HLD management.  HTN stable.  Other stroke risk factors include advanced age, former tobacco use, prior history of stroke in 2003 and history of OSA with noncompliance of CPAP.  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 He follows with Dr. Katy Fitch and plans on having follow up in 2 months. He is questioning return to driving at this time. He has been able to return to all prior activities except for driving. He lives independently but does have assistance with transportation and obtaining groceries.  Completed 3 weeks DAPT and continues on Plavix alone without side effects of bleeding or bruising Continues on Crestor without side effects myalgias Blood pressure satisfactory 132/72 He has not underwent TEE or loop recorder as recommended at hospital  discharge  No further concerns at this time  Update 01/11/2019: Brian Brown is an 86 year old male who is being seen today for stroke follow-up.  Residual stroke deficits of left homonymous hemianopia.  He continues to follow with ophthalmologist Dr. Katy Fitch with recent visit approximately 3 weeks ago.  He states he was recently started on eyedrops due to "a pressure that he did not like to see" but was unable to give any additional information.  Advised patient that we will attempt to reach out to the office to obtain recent visit notes.  He also endorses that he has returned to driving short distances without difficulty.  Question whether he was cleared to return to driving by ophthalmology and states "he did not tell me I could not drive but also did not tell me that I could drive". At prior visit, referral was placed to electrophysiology to pursue TEE with possible loop recorder placement.  TEE unremarkable therefore loop recorder placed on 09/16/2018.  On 10/09/2018, he was found to have episode of atrial fibrillation therefore Eliquis was initiated and Plavix was discontinued.  Unfortunately, he presented to ED on 10/18/2018 after episode of chest pain and diagnosed with NSTEMI after undergoing cardiac cath as well as finding of severe single-vessel disease with subtotal thrombotic occlusion of distal OM2.  Per review of ED note, he had not started on Eliquis as he had not yet filled prescription and it was felt as though this could have been the cause of his thrombotic lesion found on cardiac cath.  Unable to proceed with any additional procedures and  recommended medical therapy and he was discharged on Eliquis, Plavix and statin.  He had also been diagnosed with COVID-19 virus on 11/07/2018 and has been having increased fatigue and dyspnea on exertion since that time.  He denies dyspnea no worsening but is concerned as it has continued.  He does have follow-up with cardiology in 2 weeks to discuss further.  He  has continued on Eliquis and Plavix without bleeding or bruising.  Continues on atorvastatin without myalgias.  Blood pressure today 110/74.  Denies new or worsening stroke/TIA symptoms.     ROS:   14 system review of systems performed and negative with exception of visual impairment, fatigue and dyspnea on exertion  PMH:  Past Medical History:  Diagnosis Date  . Arthritis    degenerative joint disease  . Bleeding stomach ulcer 25 yrs ago  . CAD (coronary artery disease)    a. NSTEMI 10/2018 thrombotic lesion to OM2, 90% D1 (medical therapy), otherwise nonobstructive disease.  . Cancer [facility]) 2002   prostate, s/p surgery  . Cardiomyopathy ([place])    a. EF 45-50 by echo 10/2018.  Marland Kitchen CKD (chronic kidney disease), stage II   . Hepatitis 1981   does not know what kind;denies jaundice  . Hypertension    treated by Dr Delfina Redwood, primary MD  . Mild dilation of ascending aorta ([place])   . PAF (paroxysmal atrial fibrillation) ([place])   . Sleep apnea 2005   does not wear a Cpap  . Stroke ([place]) 2003   w/ Rt sided weakness-some-rt leg  . Wears dentures    full top  . Wears glasses     PSH:  Past Surgical History:  Procedure Laterality Date  . BUBBLE STUDY  09/16/2018   Procedure: BUBBLE STUDY;  Surgeon: Lelon Perla, MD;  Location: [place];  Service: Cardiovascular;;  . CARDIAC CATHETERIZATION  10/19/2018  . CARPAL TUNNEL RELEASE Left 03/29/2013   Procedure: LEFT CARPAL TUNNEL RELEASE;  Surgeon: Wynonia Sours, MD;  Location: [place];  Service: Orthopedics;  Laterality: Left;  ANESTHESIA: IV REGIONAL FAB  . CARPAL TUNNEL RELEASE Right 03/06/2014   Procedure: RIGHT CARPAL TUNNEL RELEASE;  Surgeon: Daryll Brod, MD;  Location: [place];  Service: Orthopedics;  Laterality: Right;  . COLONOSCOPY    . HERNIA REPAIR  2009   rt inguinal hernia  . JOINT REPLACEMENT  12/2001   rt hip ;[place] CATH AND CORONARY ANGIOGRAPHY N/A 10/19/2018    Procedure: LEFT HEART CATH AND CORONARY ANGIOGRAPHY;  Surgeon: Wellington Hampshire, MD;  Location: [place] [facility];  Service: Cardiovascular;  Laterality: N/A;  . LOOP RECORDER INSERTION N/A 09/16/2018   Procedure: LOOP RECORDER INSERTION;  Surgeon: Constance Haw, MD;  Location: [place] [facility];  Service: Cardiovascular;  Laterality: N/A;  . PROSTATECTOMY  2002  . SHOULDER ARTHROSCOPY W/ ROTATOR CUFF REPAIR  2014   lt-dr [place]  . TEE WITHOUT CARDIOVERSION N/A 09/16/2018   Procedure: TRANSESOPHAGEAL ECHOCARDIOGRAM (TEE);  Surgeon: Lelon Perla, MD;  Location: [facility];  Service: Cardiovascular;  Laterality: N/A;  . TOTAL HIP ARTHROPLASTY  02/03/2011   Procedure: TOTAL HIP ARTHROPLASTY;  Surgeon: Garald Balding, MD;  Location: [place];  Service: Orthopedics;  Laterality: Left;  . TRIGGER FINGER RELEASE Left 03/29/2013   Procedure: LEFT MIDDLE/SMALL FINGER RELEASE A-1 PULLEY AND LEFT RING FINGER;  Surgeon: Wynonia Sours, MD;  Location: [place];  Service: Orthopedics;  Laterality: Left;  .  TRIGGER FINGER RELEASE Right 03/06/2014   Procedure: RIGHT MIDDLE FINGER,RIGHT RINGER FINGER;  Surgeon: Daryll Brod, MD;  Location: [place];  Service: Orthopedics;  Laterality: Right;    Social History:  Social History   Socioeconomic History  . Marital status: Widowed    Spouse name: Not on file  . Number of children: Not on file  . Years of education: Not on file  . Highest education level: Not on file  Occupational History  . Not on file  Tobacco Use  . Smoking status: Former Smoker    Packs/day: 1.00    Years: 30.00    Pack years: 30.00    Types: Cigarettes    Quit date: 01/27/2008    Years since quitting: 10.9  . Smokeless tobacco: Never Used  Substance and Sexual Activity  . Alcohol use: No  . Drug use: No  . Sexual activity: Not on file  Other Topics Concern  . Not on file  Social History Narrative  . Not on file   Social  Determinants of Health   Financial Resource Strain:   . Difficulty of Paying Living Expenses: Not on file  Food Insecurity:   . Worried About Charity fundraiser in the Last Year: Not on file  . Ran Out of Food in the Last Year: Not on file  Transportation Needs:   . Lack of Transportation (Medical): Not on file  . Lack of Transportation (Non-Medical): Not on file  Physical Activity:   . Days of Exercise per Week: Not on file  . Minutes of Exercise per Session: Not on file  Stress:   . Feeling of Stress : Not on file  Social Connections:   . Frequency of Communication with Friends and Family: Not on file  . Frequency of Social Gatherings with Friends and Family: Not on file  . Attends Religious Services: Not on file  . Active Member of Clubs or Organizations: Not on file  . Attends Archivist Meetings: Not on file  . Marital Status: Not on file  Intimate Partner Violence:   . Fear of Current or Ex-Partner: Not on file  . Emotionally Abused: Not on file  . Physically Abused: Not on file  . Sexually Abused: Not on file    Family History:  Family History  Problem Relation Age of Onset  . Heart disease Mother   . Heart disease Father     Medications:   Current Outpatient Medications on File Prior to Visit  Medication Sig Dispense Refill  . apixaban (ELIQUIS) 5 MG TABS tablet Take 1 tablet (5 mg total) by mouth 2 (two) times daily. 60 tablet 11  . clopidogrel (PLAVIX) 75 MG tablet Take 1 tablet (75 mg total) by mouth daily with breakfast. 90 tablet 3  . furosemide (LASIX) 20 MG tablet Take 1 tablet (20 mg total) by mouth daily as needed for fluid. 30 tablet 11  . isosorbide mononitrate (IMDUR) 30 MG 24 hr tablet Take 1 tablet (30 mg total) by mouth daily. 90 tablet 3  . lisinopril (ZESTRIL) 10 MG tablet Take 1 tablet (10 mg total) by mouth daily. 30 tablet 11  . metoprolol tartrate (LOPRESSOR) 25 MG tablet TAKE 2 TABLETS TWICE A DAY 360 tablet 3  . nitroGLYCERIN  (NITROSTAT) 0.4 MG SL tablet Place 1 tablet (0.4 mg total) under the tongue every 5 (five) minutes x 3 doses as needed for chest pain. 25 tablet 6  . Omega 3-6-9 Fatty Acids (OMEGA  3-6-9 COMPLEX PO) Take 2-3 capsules by mouth See admin instructions. 3 caps in the morning, and 2 caps in the evening    . atorvastatin (LIPITOR) 40 MG tablet Take 1 tablet (40 mg total) by mouth daily. (Patient not taking: Reported on 01/11/2019) 30 tablet 11   No current facility-administered medications on file prior to visit.    Allergies:  No Known Allergies   Physical Exam  Vitals:   01/11/19 1536  BP: 110/74  Pulse: 69  Temp: (!) 97.3 F (36.3 C)  TempSrc: Oral  Weight: 175 lb 12.8 oz (79.7 kg)  Height: 5\' 9"  (1.753 m)   Body mass index is 25.96 kg/m. No exam data present  General: well developed, well nourished, very pleasant elderly AA male, seated, in no evident distress Head: head normocephalic and atraumatic.   Neck: supple with no carotid or supraclavicular bruits Cardiovascular: regular rate and rhythm, no murmurs Musculoskeletal: no deformity Skin:  no rash/petichiae Vascular:  Normal pulses all extremities   Neurologic Exam Mental Status: Awake and fully alert.   Normal speech and language.  Oriented to place and time. Recent and remote memory intact. Attention span, concentration and fund of knowledge appropriate. Mood and affect appropriate.  Cranial Nerves: Pupils equal, briskly reactive to light. Extraocular movements full without nystagmus. Visual fields show left homonymous hemianopia vs inferior quadrantanopia.  Hearing intact. Facial sensation intact. Face, tongue, palate moves normally and symmetrically.  Motor: Normal bulk and tone. Normal strength in all tested extremity muscles. Sensory.: intact to touch , pinprick , position and vibratory sensation.  Coordination: Rapid alternating movements normal in all extremities. Finger-to-nose and heel-to-shin performed accurately  bilaterally. Gait and Station: Arises from chair without difficulty. Stance is slightly hunched.  Gait demonstrates  short shuffled steps but is able to ambulate without assistive device Reflexes: 1+ and symmetric. Toes downgoing.       Diagnostic Data (Labs, Imaging, Testing)   CT head age related atrophy. Scattered sinus disease. No acute stroke.   MRI  R PCA infarct medial R occipital lobe. Small vessel disease.   MRA  Normal  MRA neck generalized dolichoectasia. Mild stenosis L P2.  2D Echo   EF 60 to 65%; no PFO  LE doppler negative  TEE to look for embolic source. Please arrange with pts cardiologist or cardiologist of choice. Ok to do as an OP if medically ready for d/c  If TEE neg, recommend OP EP evaluation for loop recorder placement to look for AF as source of stroke - alert cardiology of this plan when TEE requested and they will address together.  LDL 62  HgbA1c 5.8   ASSESSMENT: Brian Brown is a 86 y.o. year old male here with right PCA infarct embolic pattern on [id number] secondary to unknown source. Vascular risk factors include HTN and prior stroke.  Residual deficit of left homonymous hemianopia versus inferior quadrantanopia with questionable improvement.  Will request recent ophthalmology office visit notes for further information.  Since prior visit, he did have loop recorder placed which showed evidence of atrial fibrillation with initiation of Eliquis, NSTEMI and thrombotic occlusion of distal OM2 with inability to do surgical management and placed on Plavix, Eliquis and statin in October and positive Covid 19 in November.    PLAN:  1. Left PCA infarct: Continue clopidogrel 75 mg daily and Eliquis (apixaban) daily  and atorvastatin for secondary stroke prevention. Maintain strict control of hypertension with blood pressure goal below 130/90, diabetes with hemoglobin A1c goal  below 6.5% and cholesterol with LDL cholesterol (bad cholesterol) goal below 70  mg/dL.  I also advised the patient to eat a healthy diet with plenty of whole grains, cereals, fruits and vegetables, exercise regularly with at least 30 minutes of continuous activity daily and maintain ideal body weight. 2. New onset atrial fibrillation: Found on loop recorder in 10/2018.  Continuation of Eliquis and ongoing follow-up with cardiology 3. HTN: Advised to continue current treatment regimen. Advised to continue to monitor at home along with continued follow-up with PCP for management 4. HLD: Advised to continue current treatment regimen along with continued follow-up with PCP for future prescribing and monitoring of lipid panel 5. Left homonymous hemianopia: We will request office visit notes from ophthalmology and advised patient that he needs to follow-up with Dr. Katy Fitch in regards to driving due to visual impairment. 6. Dyspnea on exertion: Has been stable since COVID-19 infection without worsening and advised that he could still be feeling symptoms from that time but also advised to ensure he follows up with cardiology on 1/21 for further evaluation and to proceed to ED with any worsening   Follow up in 6 months or call earlier if needed   Greater than 50% of time during this 25 minute visit was spent on counseling, explanation of diagnosis of left PCA infarct, reviewing risk factor management of new onset atrial fibrillation, HTN and HLD, review and discussion of recent imaging and hospitalizations, discussion regarding atrial fibrillation and ongoing use of Eliquis, discussion regarding residual deficits and ongoing follow-up with ophthalmology, planning of further management along with potential future management, and discussion with patient answering all questions to [place], AGNP-BC  [facility] [address] [place], [place] [address]  Phone [phone] Fax [phone] Note: This document was prepared with digital  dictation and possible smart phrase technology. Any transcriptional errors that result from this process are unintentional.

## 2019-01-16 NOTE — Progress Notes (Signed)
I agree with the above plan 

## 2019-01-24 NOTE — Progress Notes (Deleted)
Cardiology Office Note   Date:  01/24/2019   ID:  Brian Brown, DOB 12-25-1932, MRN [id number]  PCP:  Seward Carol, MD    No chief complaint on file.  CAD  Wt Readings from Last 3 Encounters:  01/11/19 175 lb 12.8 oz (79.7 kg)  11/09/18 180 lb (81.6 kg)  11/01/18 181 lb 6.4 oz (82.3 kg)       History of Present Illness: Brian Brown is a 86 y.o. male  with history of CVA 02/2018 with PAF confirmed on loop recorder, obstructive sleep apnea (not on CPAP), recently diagnosed CAD with NSTEMI treated medically as outlined below, mild cardiomyopathy (EF 45-50%), hypertension, hyperlipidemia, CKD II, mild dilation of ascending aorta, prostate CA who presents for post-hospital follow-up.  In 02/2018 he sustained an embolic stroke with left hemianopsia. It was recommended that he have a TEE as an outpatient followed by a loop monitor implant. TEE 09/16/18 showed normal LV function, small density on aortic valve (likely lambl's excrescence), spontaneous contrast in left atrium and appendage but no thrombus, spontaneous contrast descending aorta, and negative saline microcavitation study. He subsequently underwent placement of loop recorder. Ultimately this did demonstrate paroxysms of atrial fibrillation. Therefore on 10/13/2018 during a clinic visit with Dr. Curt Bears it was suggested that he stop Plavix and start taking Eliquis. The patient unfortunately did not seem to enact changes as recommended. He was recently admitted 10/13-10/15/20 with chest pain and NSTEMI (peak troponin 7719). CTA was negative for PE and showed clear lungs despite some ill defined opacities on CXR. He underwent cardiac cath with severe single-vessel disease with subtotal thrombotic occlusion of distal OM2. The vessel was felt not to be suitable for PCI given trifurcation and was very torturous. Recommendation was for medical therapy with Plavix given thrombotic lesion. He otherwise had nonobstructive disease in  other vessels. 2D echo 10/19/18 showed EF 45-50%, mild LVH, + WMA, impaired diastolic filling, normal RV function, mild LAE/RAE, mild dilation of ascending aorta. In further talking with the patient it appeared that he never filled his Eliquis. The team speculated it was also possible that he could have had an intracardial thrombus that embolized to his OM so Eliquis was added. Aspirin was not used given need for Plavix + Eliquis and no PCI. His home Diltiazem was stopped given slight reduction in his EF and started on lisinopril 10mg  daily. Imdur 30 mg daily was added post cath. Home dose Crestor was switched to atorvastatin 40 mg secondary to lower cost for the patient. Per monitor phone report 10/24/18, metoprolol was increased to 50mg  BID for paroxysms of rapid atrial fib (12.9% AF burden).     Past Medical History:  Diagnosis Date  . Arthritis    degenerative joint disease  . Bleeding stomach ulcer 25 yrs ago  . CAD (coronary artery disease)    a. NSTEMI 10/2018 thrombotic lesion to OM2, 90% D1 (medical therapy), otherwise nonobstructive disease.  . Cancer [facility]) 2002   prostate, s/p surgery  . Cardiomyopathy ([place])    a. EF 45-50 by echo 10/2018.  Marland Kitchen CKD (chronic kidney disease), stage II   . Hepatitis 1981   does not know what kind;denies jaundice  . Hypertension    treated by Dr Delfina Redwood, primary MD  . Mild dilation of ascending aorta ([place])   . PAF (paroxysmal atrial fibrillation) ([place])   . Sleep apnea 2005   does not wear a Cpap  . Stroke [facility]) 2003   w/ Rt sided weakness-some-rt  leg  . Wears dentures    full top  . Wears glasses     Past Surgical History:  Procedure Laterality Date  . BUBBLE STUDY  09/16/2018   Procedure: BUBBLE STUDY;  Surgeon: Lelon Perla, MD;  Location: [place] [facility];  Service: Cardiovascular;;  . CARDIAC CATHETERIZATION  10/19/2018  . CARPAL TUNNEL RELEASE Left 03/29/2013   Procedure: LEFT CARPAL TUNNEL RELEASE;  Surgeon: Wynonia Sours, MD;   Location: [place];  Service: Orthopedics;  Laterality: Left;  ANESTHESIA: IV REGIONAL FAB  . CARPAL TUNNEL RELEASE Right 03/06/2014   Procedure: RIGHT CARPAL TUNNEL RELEASE;  Surgeon: Daryll Brod, MD;  Location: [place];  Service: Orthopedics;  Laterality: Right;  . COLONOSCOPY    . HERNIA REPAIR  2009   rt inguinal hernia  . JOINT REPLACEMENT  12/2001   rt hip ;[place] CATH AND CORONARY ANGIOGRAPHY N/A 10/19/2018   Procedure: LEFT HEART CATH AND CORONARY ANGIOGRAPHY;  Surgeon: Wellington Hampshire, MD;  Location: [place] CV LAB;  Service: Cardiovascular;  Laterality: N/A;  . LOOP RECORDER INSERTION N/A 09/16/2018   Procedure: LOOP RECORDER INSERTION;  Surgeon: Constance Haw, MD;  Location: [place] CV LAB;  Service: Cardiovascular;  Laterality: N/A;  . PROSTATECTOMY  2002  . SHOULDER ARTHROSCOPY W/ ROTATOR CUFF REPAIR  2014   lt-dr whitfield  . TEE WITHOUT CARDIOVERSION N/A 09/16/2018   Procedure: TRANSESOPHAGEAL ECHOCARDIOGRAM (TEE);  Surgeon: Lelon Perla, MD;  Location: [facility] ENDOSCOPY;  Service: Cardiovascular;  Laterality: N/A;  . TOTAL HIP ARTHROPLASTY  02/03/2011   Procedure: TOTAL HIP ARTHROPLASTY;  Surgeon: Garald Balding, MD;  Location: [place];  Service: Orthopedics;  Laterality: Left;  . TRIGGER FINGER RELEASE Left 03/29/2013   Procedure: LEFT MIDDLE/SMALL FINGER RELEASE A-1 PULLEY AND LEFT RING FINGER;  Surgeon: Wynonia Sours, MD;  Location: [place];  Service: Orthopedics;  Laterality: Left;  . TRIGGER FINGER RELEASE Right 03/06/2014   Procedure: RIGHT MIDDLE FINGER,RIGHT RINGER FINGER;  Surgeon: Daryll Brod, MD;  Location: [place];  Service: Orthopedics;  Laterality: Right;     Current Outpatient Medications  Medication Sig Dispense Refill  . apixaban (ELIQUIS) 5 MG TABS tablet Take 1 tablet (5 mg total) by mouth 2 (two) times daily. 60 tablet 11  . atorvastatin (LIPITOR) 40 MG  tablet Take 1 tablet (40 mg total) by mouth daily. (Patient not taking: Reported on 01/11/2019) 30 tablet 11  . clopidogrel (PLAVIX) 75 MG tablet Take 1 tablet (75 mg total) by mouth daily with breakfast. 90 tablet 3  . furosemide (LASIX) 20 MG tablet Take 1 tablet (20 mg total) by mouth daily as needed for fluid. 30 tablet 11  . isosorbide mononitrate (IMDUR) 30 MG 24 hr tablet Take 1 tablet (30 mg total) by mouth daily. 90 tablet 3  . lisinopril (ZESTRIL) 10 MG tablet Take 1 tablet (10 mg total) by mouth daily. 30 tablet 11  . metoprolol tartrate (LOPRESSOR) 25 MG tablet TAKE 2 TABLETS TWICE A DAY 360 tablet 3  . nitroGLYCERIN (NITROSTAT) 0.4 MG SL tablet Place 1 tablet (0.4 mg total) under the tongue every 5 (five) minutes x 3 doses as needed for chest pain. 25 tablet 6  . Omega 3-6-9 Fatty Acids (OMEGA 3-6-9 COMPLEX PO) Take 2-3 capsules by mouth See admin instructions. 3 caps in the morning, and 2 caps in the evening     No current facility-administered medications for  this visit.    Allergies:   Patient has no known allergies.    Social History:  The patient  reports that he quit smoking about 11 years ago. His smoking use included cigarettes. He has a 30.00 pack-year smoking history. He has never used smokeless tobacco. He reports that he does not drink alcohol or use drugs.   Family History:  The patient's ***family history includes Heart disease in his father and mother.    ROS:  Please see the history of present illness.   Otherwise, review of systems are positive for ***.   All other systems are reviewed and negative.    PHYSICAL EXAM: VS:  There were no vitals taken for this visit. , BMI There is no height or weight on file to calculate BMI. GEN: Well nourished, well developed, in no acute distress  HEENT: normal  Neck: no JVD, carotid bruits, or masses Cardiac: ***RRR; no murmurs, rubs, or gallops,no edema  Respiratory:  clear to auscultation bilaterally, normal work of  breathing GI: soft, nontender, nondistended, + BS MS: no deformity or atrophy  Skin: warm and dry, no rash Neuro:  Strength and sensation are intact Psych: euthymic mood, full affect   EKG:   The ekg ordered today demonstrates ***   Recent Labs: 02/13/2018: ALT 15 10/18/2018: B Natriuretic Peptide 204.1 11/01/2018: BUN 17; Creatinine, Ser 1.14; Hemoglobin 13.0; Platelets 259; Potassium 4.0; Sodium 140; TSH 1.630   Lipid Panel    Component Value Date/Time   CHOL 117 02/14/2018 0303   TRIG 48 02/14/2018 0303   HDL 45 02/14/2018 0303   CHOLHDL 2.6 02/14/2018 0303   VLDL 10 02/14/2018 0303   LDLCALC 62 02/14/2018 0303     Other studies Reviewed: Additional studies/ records that were reviewed today with results demonstrating: ***.   ASSESSMENT AND PLAN:  1. CAD:  2. PAF: 3. Dilated aortic root: 4. Mildly decreased LV function:   Current medicines are reviewed at length with the patient today.  The patient concerns regarding his medicines were addressed.  The following changes have been made:  No change***  Labs/ tests ordered today include: *** No orders of the defined types were placed in this encounter.   Recommend 150 minutes/week of aerobic exercise Low fat, low carb, high fiber diet recommended  Disposition:   FU in ***   Signed, Larae Grooms, MD  01/24/2019 10:07 PM    [facility] [place], [place], [place]  [address] Phone: [phone]; Fax: [phone]

## 2019-01-26 ENCOUNTER — Ambulatory Visit: Payer: Medicare Other | Admitting: Interventional Cardiology

## 2019-01-30 ENCOUNTER — Ambulatory Visit (INDEPENDENT_AMBULATORY_CARE_PROVIDER_SITE_OTHER): Payer: Medicare HMO | Admitting: *Deleted

## 2019-01-30 DIAGNOSIS — I480 Paroxysmal atrial fibrillation: Secondary | ICD-10-CM | POA: Diagnosis not present

## 2019-01-31 LAB — CUP PACEART REMOTE DEVICE CHECK
Date Time Interrogation Session: 20210125212332
Implantable Pulse Generator Implant Date: 20200911

## 2019-02-23 ENCOUNTER — Telehealth: Payer: Self-pay | Admitting: *Deleted

## 2019-02-23 MED ORDER — METOPROLOL TARTRATE 25 MG PO TABS: 75.0000 mg | ORAL_TABLET | Freq: Two times a day (BID) | ORAL | 6 refills | Status: DC

## 2019-02-23 NOTE — Telephone Encounter (Signed)
-----   Message from Will Meredith Leeds, MD sent at 02/20/2019  9:24 AM EST ----- Increase to 75 mg bid ----- Message ----- From: Stanton Kidney, RN Sent: 02/17/2019   5:59 PM EST To: Constance Haw, MD  Pt is already taking Metoprolol 50 mg BID, according to chart. Please advise ----- Message ----- From: Constance Haw, MD Sent: 02/01/2019   2:08 PM EST To: Stanton Kidney, RN  Abnormal LINQ reviewed. Notable for rapid AF, increase metoprolol to 50 mg BID.

## 2019-02-23 NOTE — Telephone Encounter (Signed)
Informed patient of results and verbal understanding expressed. Pt understands to take 3 tablets BID starting this evening. Advised to call the office is issues arise after medication increase. If occurs over the weekend, pt may decrease back to 2 tabs BID and call next week to discuss. Rx sent to pharmacy.

## 2019-03-03 ENCOUNTER — Ambulatory Visit (INDEPENDENT_AMBULATORY_CARE_PROVIDER_SITE_OTHER): Payer: Medicare HMO | Admitting: *Deleted

## 2019-03-03 DIAGNOSIS — I639 Cerebral infarction, unspecified: Secondary | ICD-10-CM | POA: Diagnosis not present

## 2019-03-03 LAB — CUP PACEART REMOTE DEVICE CHECK
Date Time Interrogation Session: 20210225212442
Implantable Pulse Generator Implant Date: 20200911

## 2019-03-03 NOTE — Progress Notes (Signed)
ILR remote 

## 2019-03-05 ENCOUNTER — Ambulatory Visit: Payer: Medicare HMO | Attending: Internal Medicine

## 2019-03-05 DIAGNOSIS — Z23 Encounter for immunization: Secondary | ICD-10-CM

## 2019-03-05 NOTE — Progress Notes (Signed)
   Covid-19 Vaccination Clinic  Name:  Brian Brown    MRN: [id number] DOB: 1932/11/28  03/05/2019  Brian Brown was observed post Covid-19 immunization for 15 minutes without incidence. He was provided with Vaccine Information Sheet and instruction to access the V-Safe system.   Brian Brown was instructed to call 911 with any severe reactions post vaccine: Marland Kitchen Difficulty breathing  . Swelling of your face and throat  . A fast heartbeat  . A bad rash all over your body  . Dizziness and weakness    Immunizations Administered    Name Date Dose VIS Date Route   Pfizer COVID-19 Vaccine 03/05/2019 11:50 AM 0.3 mL 12/16/2018 Intramuscular   Manufacturer: [place]   Lot: [id number]   [place]: [id number]

## 2019-03-07 ENCOUNTER — Telehealth: Payer: Self-pay | Admitting: Adult Health

## 2019-03-07 NOTE — Telephone Encounter (Addendum)
Received office visit via fax from [facility] from visit on 11/29/2018.  Impression/plan as follows  "1. POAG, indeterminate OU.  Based on nerve/patchy. Impossible to stage, hard to know what is from stroke and what is from glaucoma.  Would prefer IOP lower, will start Latanoprost qd OU and check in 6 months. 2.  Homonymous bilateral field defects, left side: Vfs hearing change.  Discussed that it is too dangerous for him to drive. 3.  Pseudophakia OU.  Implants look good."

## 2019-03-08 ENCOUNTER — Encounter: Payer: Self-pay | Admitting: Occupational Therapy

## 2019-04-03 ENCOUNTER — Ambulatory Visit (INDEPENDENT_AMBULATORY_CARE_PROVIDER_SITE_OTHER): Payer: Medicare HMO | Admitting: *Deleted

## 2019-04-03 DIAGNOSIS — I639 Cerebral infarction, unspecified: Secondary | ICD-10-CM | POA: Diagnosis not present

## 2019-04-03 LAB — CUP PACEART REMOTE DEVICE CHECK
Date Time Interrogation Session: 20210328211959
Implantable Pulse Generator Implant Date: 20200911

## 2019-04-03 NOTE — Progress Notes (Signed)
ILR Remote 

## 2019-04-04 ENCOUNTER — Ambulatory Visit: Payer: Medicare HMO | Attending: Internal Medicine

## 2019-04-04 DIAGNOSIS — Z23 Encounter for immunization: Secondary | ICD-10-CM

## 2019-04-04 NOTE — Progress Notes (Signed)
   Covid-19 Vaccination Clinic  Name:  Brian Brown    MRN: [id number] DOB: 1932-03-29  04/04/2019  [name] was observed post Covid-19 immunization for 15 minutes without incident. He was provided with Vaccine Information Sheet and instruction to access the V-Safe system.   [name] was instructed to call 911 with any severe reactions post vaccine: Marland Kitchen Difficulty breathing  . Swelling of face and throat  . A fast heartbeat  . A bad rash all over body  . Dizziness and weakness   Immunizations Administered    Name Date Dose VIS Date Route   Pfizer COVID-19 Vaccine 04/04/2019  9:34 AM 0.3 mL 12/16/2018 Intramuscular   Manufacturer: [place]   Lot: [id number]   [place]: [id number]

## 2019-04-13 ENCOUNTER — Emergency Department (HOSPITAL_COMMUNITY)
Admission: EM | Admit: 2019-04-13 | Discharge: 2019-04-13 | Disposition: A | Discharge status: Home / Self Care | Payer: Medicare HMO | Attending: Emergency Medicine | Admitting: Emergency Medicine

## 2019-04-13 ENCOUNTER — Ambulatory Visit (HOSPITAL_COMMUNITY)
Admission: EM | Admit: 2019-04-13 | Discharge: 2019-04-13 | Disposition: A | Discharge status: Home / Self Care | Payer: Medicare HMO | Source: Home / Self Care

## 2019-04-13 ENCOUNTER — Other Ambulatory Visit: Payer: Self-pay

## 2019-04-13 ENCOUNTER — Emergency Department (HOSPITAL_COMMUNITY): Payer: Medicare HMO

## 2019-04-13 DIAGNOSIS — Z5321 Procedure and treatment not carried out due to patient leaving prior to being seen by health care provider: Secondary | ICD-10-CM | POA: Insufficient documentation

## 2019-04-13 DIAGNOSIS — R0602 Shortness of breath: Secondary | ICD-10-CM | POA: Insufficient documentation

## 2019-04-13 LAB — BASIC METABOLIC PANEL
Anion gap: 5 (ref 5–15)
BUN: 21 mg/dL (ref 8–23)
CO2: 29 mmol/L (ref 22–32)
Calcium: 9.1 mg/dL (ref 8.9–10.3)
Chloride: 107 mmol/L (ref 98–111)
Creatinine, Ser: 1.15 mg/dL (ref 0.61–1.24)
GFR calc Af Amer: >60 mL/min (ref >60)
GFR calc non Af Amer: 58 mL/min — ABNORMAL LOW (ref >60)
Glucose, Bld: 101 mg/dL — ABNORMAL HIGH (ref 70–99)
Potassium: 4.1 mmol/L (ref 3.5–5.1)
Sodium: 141 mmol/L (ref 135–145)

## 2019-04-13 LAB — TROPONIN I (HIGH SENSITIVITY)
Troponin I (High Sensitivity): 58 ng/L — ABNORMAL HIGH (ref <18)
Troponin I (High Sensitivity): 57 ng/L — ABNORMAL HIGH (ref <18)

## 2019-04-13 LAB — CBC
HCT: 42.3 % (ref 39.0–52.0)
Hemoglobin: 13.6 g/dL (ref 13.0–17.0)
MCH: 29.7 pg (ref 26.0–34.0)
MCHC: 32.2 g/dL (ref 30.0–36.0)
MCV: 92.4 fL (ref 80.0–100.0)
Platelets: 198 10*3/uL (ref 150–400)
RBC: 4.58 MIL/uL (ref 4.22–5.81)
RDW: 14 % (ref 11.5–15.5)
WBC: 6.2 10*3/uL (ref 4.0–10.5)
nRBC: 0 % (ref 0.0–0.2)

## 2019-04-13 MED ORDER — SODIUM CHLORIDE 0.9% FLUSH: 3.0000 mL | Freq: Once | INTRAVENOUS | Status: DC

## 2019-04-13 NOTE — ED Notes (Signed)
Called pts name 3x with no response. Pt assumed to have left AMA.

## 2019-04-13 NOTE — ED Notes (Signed)
Called for recheck VS x3, no response 

## 2019-04-13 NOTE — ED Triage Notes (Signed)
To ED for eval of increased sob. Pt with productive cough 'sometimes'. Denies pain. Seen at [facility] and sent to ED for further eval. Pt speaks in complete sentences.

## 2019-04-13 NOTE — ED Notes (Signed)
Called pts name 3x to recheck vitals with no response.

## 2019-04-13 NOTE — ED Triage Notes (Signed)
Pt c/o SOB onset this morning, with cough. Also c/o significant decreased energy, "naps" today per pt family which is not normal for pt. Family member also reports slurred speech onset yesterday. "Not acting like his normal self." Pt states he normally takes his dog out for walk, and felt so SOB that he couldn't do it this morning.  Pt alert, oriented x4. SOB with speaking small phrases, difficulty ambulating 2/2 weakness.  This RN spoke with Dr. Meda Coffee, who advised that pt should go to ER STAT for further eval 2/2 complaints and medical history. Pt and family verbalized understanding and godson agreed to drive pt to ER STAT. Patient is being discharged from the Urgent [place] and sent to the Emergency Department via POV and family. Per Dr. Meda Coffee, patient is stable but in need of higher level of care due to SOB, slurred speech. Patient is aware and verbalizes understanding of plan of care.  Vitals:   04/13/19 1525  BP: (!) 130/93  Pulse: 76  Resp: (!) 24  Temp: 98.2 F (36.8 C)  SpO2: 100%

## 2019-05-03 NOTE — Progress Notes (Signed)
Cardiology Office Note   Date:  05/04/2019   ID:  Brian Brown, DOB July 12, 1932, MRN [id number]  PCP:  Seward Carol, MD    No chief complaint on file.  CAD  Wt Readings from Last 3 Encounters:  05/04/19 169 lb (76.7 kg)  04/13/19 170 lb (77.1 kg)  01/11/19 175 lb 12.8 oz (79.7 kg)       History of Present Illness: Brian Brown is a 86 y.o. male  Who had stroke in 02/2018 and NSTEMI in 10/2018.  Cath showed: " Cath: 10/19/18   The left ventricular systolic function is normal.  LV end diastolic pressure is normal.  The left ventricular ejection fraction is 55-65% by visual estimate.  Prox RCA lesion is 30% stenosed.  Dist RCA lesion is 20% stenosed.  Ost Cx to Prox Cx lesion is 50% stenosed.  Prox LAD to Mid LAD lesion is 60% stenosed.  1st Diag lesion is 90% stenosed.  Dist LAD lesion is 30% stenosed.  2nd Mrg lesion is 90% stenosed.  1. Severe one-vessel coronary artery disease with subtotal thrombotic occlusion of the distal OM2 at the trifurcation of 3 terminal branches. The vessel in this area is 1.5 mm in diameter and at a trifurcation and thus not suitable for PCI. The vessel is also very tortuous. There is also moderate ostial left circumflex and proximal LAD disease. 2. Normal LV systolic function and left ventricular end-diastolic pressure.  Recommendations: Recommend aggressive medical therapy. Eliquis can be resumed tonight. I favor that he continues to take an antiplatelet medication given this thrombotic event. He seems to be already on Plavix which should be continued."  Echo showed EF 45%-50%.  LINQ showed AFib in 10/2018. Discharged on Eliquis and Plavix.   Since the last visit, he has done well.  No bleeding issues on the Eliquis and the clopidogrel.   He had 1 episode of chest pain, and it lasted a few minutes and resolved spontaneously.  He does not exercise much.  He walks his dog.    Denies : Chest pain.  Dizziness. Leg edema. Nitroglycerin use. Orthopnea. Palpitations. Paroxysmal nocturnal dyspnea. Shortness of breath. Syncope.   He had a fall 2-3 months ago when in his yard.  He did not hurt himself.      Past Medical History:  Diagnosis Date  . Arthritis    degenerative joint disease  . Bleeding stomach ulcer 25 yrs ago  . CAD (coronary artery disease)    a. NSTEMI 10/2018 thrombotic lesion to OM2, 90% D1 (medical therapy), otherwise nonobstructive disease.  . Cancer [facility]) 2002   prostate, s/p surgery  . Cardiomyopathy ([place])    a. EF 45-50 by echo 10/2018.  Marland Kitchen CKD (chronic kidney disease), stage II   . Hepatitis 1981   does not know what kind;denies jaundice  . Hypertension    treated by Dr Delfina Redwood, primary MD  . Mild dilation of ascending aorta ([place])   . PAF (paroxysmal atrial fibrillation) ([place])   . Sleep apnea 2005   does not wear a Cpap  . Stroke ([place]) 2003   w/ Rt sided weakness-some-rt leg  . Wears dentures    full top  . Wears glasses     Past Surgical History:  Procedure Laterality Date  . BUBBLE STUDY  09/16/2018   Procedure: BUBBLE STUDY;  Surgeon: Lelon Perla, MD;  Location: [place] [facility];  Service: Cardiovascular;;  . CARDIAC CATHETERIZATION  10/19/2018  . CARPAL TUNNEL RELEASE Left 03/29/2013  Procedure: LEFT CARPAL TUNNEL RELEASE;  Surgeon: Wynonia Sours, MD;  Location: [place];  Service: Orthopedics;  Laterality: Left;  ANESTHESIA: IV REGIONAL FAB  . CARPAL TUNNEL RELEASE Right 03/06/2014   Procedure: RIGHT CARPAL TUNNEL RELEASE;  Surgeon: Daryll Brod, MD;  Location: [place];  Service: Orthopedics;  Laterality: Right;  . COLONOSCOPY    . HERNIA REPAIR  2009   rt inguinal hernia  . JOINT REPLACEMENT  12/2001   rt hip ;[place] CATH AND CORONARY ANGIOGRAPHY N/A 10/19/2018   Procedure: LEFT HEART CATH AND CORONARY ANGIOGRAPHY;  Surgeon: Wellington Hampshire, MD;  Location: [place] CV LAB;  Service:  Cardiovascular;  Laterality: N/A;  . LOOP RECORDER INSERTION N/A 09/16/2018   Procedure: LOOP RECORDER INSERTION;  Surgeon: Constance Haw, MD;  Location: [place] CV LAB;  Service: Cardiovascular;  Laterality: N/A;  . PROSTATECTOMY  2002  . SHOULDER ARTHROSCOPY W/ ROTATOR CUFF REPAIR  2014   lt-dr whitfield  . TEE WITHOUT CARDIOVERSION N/A 09/16/2018   Procedure: TRANSESOPHAGEAL ECHOCARDIOGRAM (TEE);  Surgeon: Lelon Perla, MD;  Location: [facility] ENDOSCOPY;  Service: Cardiovascular;  Laterality: N/A;  . TOTAL HIP ARTHROPLASTY  02/03/2011   Procedure: TOTAL HIP ARTHROPLASTY;  Surgeon: Garald Balding, MD;  Location: [place];  Service: Orthopedics;  Laterality: Left;  . TRIGGER FINGER RELEASE Left 03/29/2013   Procedure: LEFT MIDDLE/SMALL FINGER RELEASE A-1 PULLEY AND LEFT RING FINGER;  Surgeon: Wynonia Sours, MD;  Location: [place];  Service: Orthopedics;  Laterality: Left;  . TRIGGER FINGER RELEASE Right 03/06/2014   Procedure: RIGHT MIDDLE FINGER,RIGHT RINGER FINGER;  Surgeon: Daryll Brod, MD;  Location: [place];  Service: Orthopedics;  Laterality: Right;     Current Outpatient Medications  Medication Sig Dispense Refill  . apixaban (ELIQUIS) 5 MG TABS tablet Take 1 tablet (5 mg total) by mouth 2 (two) times daily. 60 tablet 11  . atorvastatin (LIPITOR) 40 MG tablet Take 1 tablet (40 mg total) by mouth daily. 30 tablet 11  . clopidogrel (PLAVIX) 75 MG tablet Take 1 tablet (75 mg total) by mouth daily with breakfast. 90 tablet 3  . furosemide (LASIX) 20 MG tablet Take 1 tablet (20 mg total) by mouth daily as needed for fluid. 30 tablet 11  . isosorbide mononitrate (IMDUR) 30 MG 24 hr tablet Take 1 tablet (30 mg total) by mouth daily. 90 tablet 3  . lisinopril (ZESTRIL) 10 MG tablet Take 1 tablet (10 mg total) by mouth daily. 30 tablet 11  . metoprolol tartrate (LOPRESSOR) 25 MG tablet Take 3 tablets (75 mg total) by mouth 2 (two) times daily. 180 tablet  6  . nitroGLYCERIN (NITROSTAT) 0.4 MG SL tablet Place 1 tablet (0.4 mg total) under the tongue every 5 (five) minutes x 3 doses as needed for chest pain. 25 tablet 6  . Omega 3-6-9 Fatty Acids (OMEGA 3-6-9 COMPLEX PO) Take 2-3 capsules by mouth See admin instructions. 3 caps in the morning, and 2 caps in the evening     No current facility-administered medications for this visit.    Allergies:   Patient has no known allergies.    Social History:  The patient  reports that he quit smoking about 11 years ago. His smoking use included cigarettes. He has a 30.00 pack-year smoking history. He has never used smokeless tobacco. He reports that he does not drink alcohol or use drugs.   Family History:  The patient's family history includes Heart disease in his father and mother.    ROS:  Please see the history of present illness.   Otherwise, review of systems are positive for decreased balance.   All other systems are reviewed and negative.    PHYSICAL EXAM: VS:  BP 116/76   Pulse 73   Ht 5\' 9"  (1.753 m)   Wt 169 lb (76.7 kg)   SpO2 98%   BMI 24.96 kg/m  , BMI Body mass index is 24.96 kg/m. GEN: Well nourished, well developed, in no acute distress  HEENT: normal  Neck: no JVD, carotid bruits, or masses Cardiac: RRR, premature beats; no murmurs, rubs, or gallops,; right leg pitting edema 2+, minimal left leg edema  Respiratory:  clear to auscultation bilaterally, normal work of breathing GI: soft, nontender, nondistended, + BS MS: no deformity or atrophy  Skin: warm and dry, no rash Neuro:  Strength and sensation are intact Psych: euthymic mood, full affect   EKG:   The ekg ordered today demonstrates    Recent Labs: 10/18/2018: B Natriuretic Peptide 204.1 11/01/2018: TSH 1.630 04/13/2019: BUN 21; Creatinine, Ser 1.15; Hemoglobin 13.6; Platelets 198; Potassium 4.1; Sodium 141   Lipid Panel    Component Value Date/Time   CHOL 117 02/14/2018 0303   TRIG 48 02/14/2018 0303    HDL 45 02/14/2018 0303   CHOLHDL 2.6 02/14/2018 0303   VLDL 10 02/14/2018 0303   LDLCALC 62 02/14/2018 0303     Other studies Reviewed: Additional studies/ records that were reviewed today with results demonstrating: labs reviewed.   ASSESSMENT AND PLAN:  1. CAD: Angina controlled on medical therapy. Continue aggressive secondary prevention.  2. PAF: In NSR.  Eliquis for stroke prevention.  No bleeding issues.  3. Dilated aorta: Minimal.  No need for f/u at this point. 4. Chronic systolic heart failure: Appears euvolemic.   5. Right leg edema: Elevate right leg.  OK to use prn furosemide.   6. He states light went off of the device he has at home, presumably for loop monitor.     Current medicines are reviewed at length with the patient today.  The patient concerns regarding his medicines were addressed.  The following changes have been made:  No change  Labs/ tests ordered today include:  No orders of the defined types were placed in this encounter.   Recommend 150 minutes/week of aerobic exercise Low fat, low carb, high fiber diet recommended  Disposition:   FU in 6 months   Signed, Larae Grooms, MD  05/04/2019 3:23 PM    [facility] [place], [place], [place]  [address] Phone: [phone]; Fax: [phone]

## 2019-05-04 ENCOUNTER — Ambulatory Visit: Payer: Medicare HMO | Admitting: Interventional Cardiology

## 2019-05-04 ENCOUNTER — Other Ambulatory Visit: Payer: Self-pay

## 2019-05-04 ENCOUNTER — Encounter: Payer: Self-pay | Admitting: Interventional Cardiology

## 2019-05-04 VITALS — BP 116/76 | HR 73 | Ht 69.0 in | Wt 169.0 lb

## 2019-05-04 DIAGNOSIS — I2510 Atherosclerotic heart disease of native coronary artery without angina pectoris: Secondary | ICD-10-CM | POA: Diagnosis not present

## 2019-05-04 DIAGNOSIS — I5022 Chronic systolic (congestive) heart failure: Secondary | ICD-10-CM | POA: Diagnosis not present

## 2019-05-04 DIAGNOSIS — I480 Paroxysmal atrial fibrillation: Secondary | ICD-10-CM

## 2019-05-04 DIAGNOSIS — I77810 Thoracic aortic ectasia: Secondary | ICD-10-CM | POA: Diagnosis not present

## 2019-05-04 LAB — CUP PACEART REMOTE DEVICE CHECK
Date Time Interrogation Session: 20210428212253
Implantable Pulse Generator Implant Date: 20200911

## 2019-05-04 NOTE — Patient Instructions (Signed)
Medication Instructions:  Your physician recommends that you continue on your current medications as directed. Please refer to the Current Medication list given to you today.  *If you need a refill on your cardiac medications before your next appointment, please call your pharmacy*   Lab Work: None ordered  If you have labs (blood work) drawn today and your tests are completely normal, you will receive your results only by: . MyChart Message (if you have MyChart) OR . A paper copy in the mail If you have any lab test that is abnormal or we need to change your treatment, we will call you to review the results.   Testing/Procedures: None ordered   Follow-Up: At [facility], you and your health needs are our priority.  As part of our continuing mission to provide you with exceptional heart care, we have created designated Provider Care Teams.  These Care Teams include your primary Cardiologist (physician) and Advanced Practice Providers (APPs -  Physician Assistants and Nurse Practitioners) who all work together to provide you with the care you need, when you need it.  We recommend signing up for the patient portal called "MyChart".  Sign up information is provided on this After Visit Summary.  MyChart is used to connect with patients for Virtual Visits (Telemedicine).  Patients are able to view lab/test results, encounter notes, upcoming appointments, etc.  Non-urgent messages can be sent to your provider as well.   To learn more about what you can do with MyChart, go to [email or web address].    Your next appointment:   6 month(s)  The format for your next appointment:   In Person  Provider:   You may see Jayadeep Varanasi, MD or one of the following Advanced Practice Providers on your designated Care Team:    Dayna Dunn, PA-C  Michele Lenze, PA-C    Other Instructions   

## 2019-05-05 ENCOUNTER — Ambulatory Visit (INDEPENDENT_AMBULATORY_CARE_PROVIDER_SITE_OTHER): Payer: Medicare HMO | Admitting: Student

## 2019-05-05 ENCOUNTER — Encounter: Payer: Self-pay | Admitting: Student

## 2019-05-05 VITALS — BP 120/68 | HR 66 | Ht 69.0 in | Wt 168.2 lb

## 2019-05-05 DIAGNOSIS — I2510 Atherosclerotic heart disease of native coronary artery without angina pectoris: Secondary | ICD-10-CM | POA: Diagnosis not present

## 2019-05-05 DIAGNOSIS — I10 Essential (primary) hypertension: Secondary | ICD-10-CM

## 2019-05-05 DIAGNOSIS — Z79899 Other long term (current) drug therapy: Secondary | ICD-10-CM | POA: Diagnosis not present

## 2019-05-05 DIAGNOSIS — I480 Paroxysmal atrial fibrillation: Secondary | ICD-10-CM

## 2019-05-05 LAB — CUP PACEART INCLINIC DEVICE CHECK
Date Time Interrogation Session: 20210430112407
Implantable Pulse Generator Implant Date: 20200911

## 2019-05-05 NOTE — Patient Instructions (Addendum)
Medication Instructions:  NONE *If you need a refill on your cardiac medications before your next appointment, please call your pharmacy*   Lab Work: NONE If you have labs (blood work) drawn today and your tests are completely normal, you will receive your results only by: Marland Kitchen MyChart Message (if you have MyChart) OR . A paper copy in the mail If you have any lab test that is abnormal or we need to change your treatment, we will call you to review the results.   Testing/Procedures: NONE   Follow-Up: At [facility], you and your health needs are our priority.  As part of our continuing mission to provide you with exceptional heart care, we have created designated Provider Care Teams.  These Care Teams include your primary Cardiologist (physician) and Advanced Practice Providers (APPs -  Physician Assistants and Nurse Practitioners) who all work together to provide you with the care you need, when you need it.  We recommend signing up for the patient portal called "MyChart".  Sign up information is provided on this After Visit Summary.  MyChart is used to connect with patients for Virtual Visits (Telemedicine).  Patients are able to view lab/test results, encounter notes, upcoming appointments, etc.  Non-urgent messages can be sent to your provider as well.   To learn more about what you can do with MyChart, go to [email or web address].    Your next appointment:   6 months  The format for your next appointment:   Either In Person or Virtual  Provider:   Dr Curt Bears   Other Instructions  Device Instructions:  Unplug, wait 1 minute, Plug In.                                   If Brian Brown                                   If [place] or not at all, call [employer] [phone]

## 2019-05-05 NOTE — Progress Notes (Signed)
Electrophysiology Office Note Date: 05/05/2019  ID:  BOONE CUEBAS, DOB [birth year], MRN [id number]  PCP: Seward Carol, MD Primary Cardiologist: Larae Grooms, MD Electrophysiologist: Constance Haw, MD   CC: ILR follow-up  Brian Brown is a 86 y.o. male seen today for Dr. Curt Bears . he presents today for routine electrophysiology followup.  Since last being seen in our clinic, the patient reports doing very well. Seen in ED for SOB earlier this month, but left prior to being seen. His symptoms improved, and his ride was ready to leave. He has mild SOB with moderate exertion, his example is by the end of walking his dogs he is somewhat winded. He denies chest pain, palpitations, , PND, orthopnea, nausea, vomiting, dizziness, syncope, edema, weight gain, or early satiety.  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 Arthritis    degenerative joint disease  . Bleeding stomach ulcer 25 yrs ago  . CAD (coronary artery disease)    a. NSTEMI 10/2018 thrombotic lesion to OM2, 90% D1 (medical therapy), otherwise nonobstructive disease.  . Cancer [facility]) 2002   prostate, s/p surgery  . Cardiomyopathy ([place])    a. EF 45-50 by echo 10/2018.  Marland Kitchen CKD (chronic kidney disease), stage II   . Hepatitis 1981   does not know what kind;denies jaundice  . Hypertension    treated by Dr Delfina Redwood, primary MD  . Mild dilation of ascending aorta ([place])   . PAF (paroxysmal atrial fibrillation) ([place])   . Sleep apnea 2005   does not wear a Cpap  . Stroke ([place]) 2003   w/ Rt sided weakness-some-rt leg  . Wears dentures    full top  . Wears glasses    Past Surgical History:  Procedure Laterality Date  . BUBBLE STUDY  09/16/2018   Procedure: BUBBLE STUDY;  Surgeon: Lelon Perla, MD;  Location: [place];  Service: Cardiovascular;;  . CARDIAC CATHETERIZATION  10/19/2018  . CARPAL TUNNEL RELEASE Left  03/29/2013   Procedure: LEFT CARPAL TUNNEL RELEASE;  Surgeon: Wynonia Sours, MD;  Location: [place];  Service: Orthopedics;  Laterality: Left;  ANESTHESIA: IV REGIONAL FAB  . CARPAL TUNNEL RELEASE Right 03/06/2014   Procedure: RIGHT CARPAL TUNNEL RELEASE;  Surgeon: Daryll Brod, MD;  Location: [place];  Service: Orthopedics;  Laterality: Right;  . COLONOSCOPY    . HERNIA REPAIR  2009   rt inguinal hernia  . JOINT REPLACEMENT  12/2001   rt hip ;[place] CATH AND CORONARY ANGIOGRAPHY N/A 10/19/2018   Procedure: LEFT HEART CATH AND CORONARY ANGIOGRAPHY;  Surgeon: Wellington Hampshire, MD;  Location: [place] CV LAB;  Service: Cardiovascular;  Laterality: N/A;  . LOOP RECORDER INSERTION N/A 09/16/2018   Procedure: LOOP RECORDER INSERTION;  Surgeon: Constance Haw, MD;  Location: [place] CV LAB;  Service: Cardiovascular;  Laterality: N/A;  . PROSTATECTOMY  2002  . SHOULDER ARTHROSCOPY W/ ROTATOR CUFF REPAIR  2014   lt-dr whitfield  . TEE WITHOUT CARDIOVERSION N/A 09/16/2018   Procedure: TRANSESOPHAGEAL ECHOCARDIOGRAM (TEE);  Surgeon: Lelon Perla, MD;  Location: [facility] ENDOSCOPY;  Service: Cardiovascular;  Laterality: N/A;  . TOTAL HIP ARTHROPLASTY  02/03/2011   Procedure: TOTAL HIP ARTHROPLASTY;  Surgeon: Garald Balding, MD;  Location: [place];  Service: Orthopedics;  Laterality: Left;  . TRIGGER FINGER RELEASE Left 03/29/2013   Procedure: LEFT MIDDLE/SMALL FINGER RELEASE  A-1 PULLEY AND LEFT RING FINGER;  Surgeon: Wynonia Sours, MD;  Location: [place];  Service: Orthopedics;  Laterality: Left;  . TRIGGER FINGER RELEASE Right 03/06/2014   Procedure: RIGHT MIDDLE FINGER,RIGHT RINGER FINGER;  Surgeon: Daryll Brod, MD;  Location: [place];  Service: Orthopedics;  Laterality: Right;    Current Outpatient Medications  Medication Sig Dispense Refill  . apixaban (ELIQUIS) 5 MG TABS tablet Take 1 tablet (5 mg  total) by mouth 2 (two) times daily. 60 tablet 11  . atorvastatin (LIPITOR) 40 MG tablet Take 1 tablet (40 mg total) by mouth daily. 30 tablet 11  . clopidogrel (PLAVIX) 75 MG tablet Take 1 tablet (75 mg total) by mouth daily with breakfast. 90 tablet 3  . furosemide (LASIX) 20 MG tablet Take 1 tablet (20 mg total) by mouth daily as needed for fluid. 30 tablet 11  . isosorbide mononitrate (IMDUR) 30 MG 24 hr tablet Take 1 tablet (30 mg total) by mouth daily. 90 tablet 3  . lisinopril (ZESTRIL) 10 MG tablet Take 1 tablet (10 mg total) by mouth daily. 30 tablet 11  . metoprolol tartrate (LOPRESSOR) 25 MG tablet Take 3 tablets (75 mg total) by mouth 2 (two) times daily. 180 tablet 6  . nitroGLYCERIN (NITROSTAT) 0.4 MG SL tablet Place 1 tablet (0.4 mg total) under the tongue every 5 (five) minutes x 3 doses as needed for chest pain. 25 tablet 6  . Omega 3-6-9 Fatty Acids (OMEGA 3-6-9 COMPLEX PO) Take 2-3 capsules by mouth See admin instructions. 3 caps in the morning, and 2 caps in the evening     No current facility-administered medications for this visit.    Allergies:   Patient has no known allergies.   Social History: Social History   Socioeconomic History  . Marital status: Widowed    Spouse name: Not on file  . Number of children: Not on file  . Years of education: Not on file  . Highest education level: Not on file  Occupational History  . Not on file  Tobacco Use  . Smoking status: Former Smoker    Packs/day: 1.00    Years: 30.00    Pack years: 30.00    Types: Cigarettes    Quit date: 01/27/2008    Years since quitting: 11.2  . Smokeless tobacco: Never Used  Substance and Sexual Activity  . Alcohol use: No  . Drug use: No  . Sexual activity: Not on file  Other Topics Concern  . Not on file  Social History Narrative  . Not on file   Social Determinants of Health   Financial Resource Strain:   . Difficulty of Paying Living Expenses:   Food Insecurity:   . Worried  About Charity fundraiser in the Last Year:   . Arboriculturist in the Last Year:   Transportation Needs:   . Film/video editor (Medical):   Marland Kitchen Lack of Transportation (Non-Medical):   Physical Activity:   . Days of Exercise per Week:   . Minutes of Exercise per Session:   Stress:   . Feeling of Stress :   Social Connections:   . Frequency of Communication with Friends and Family:   . Frequency of Social Gatherings with Friends and Family:   . Attends Religious Services:   . Active Member of Clubs or Organizations:   . Attends Archivist Meetings:   Marland Kitchen Marital Status:   Intimate Partner Violence:   .  Fear of Current or Ex-Partner:   . Emotionally Abused:   Marland Kitchen Physically Abused:   . Sexually Abused:     Family History: Family History  Problem Relation Age of Onset  . Heart disease Mother   . Heart disease Father      Review of Systems: All other systems reviewed and are otherwise negative except as noted above.  Physical Exam: Vitals:   05/05/19 1104  BP: 120/68  Pulse: 66  SpO2: 94%  Weight: 168 lb 3.2 oz (76.3 kg)  Height: 5\' 9"  (1.753 m)     GEN- The patient is well appearing, alert and oriented x 3 today.   HEENT: normocephalic, atraumatic; sclera clear, conjunctiva pink; hearing intact; oropharynx clear; neck supple  Lungs- Clear to ausculation bilaterally, normal work of breathing.  No wheezes, rales, rhonchi Heart- Regular rate and rhythm, no murmurs, rubs or gallops  GI- soft, non-tender, non-distended, bowel sounds present  Extremities- no clubbing, cyanosis, or edema  MS- no significant deformity or atrophy Skin- warm and dry, no rash or lesion; PPM pocket well healed Psych- euthymic mood, full affect Neuro- strength and sensation are intact  PPM Interrogation- reviewed in detail today,  See PACEART report  EKG:  EKG is not ordered today. The ekg ordered 04/13/2019 appears to show atrial fultter with ventricular rate of 76 bpm   Recent  Labs: 10/18/2018: B Natriuretic Peptide 204.1 11/01/2018: TSH 1.630 04/13/2019: BUN 21; Creatinine, Ser 1.15; Hemoglobin 13.6; Platelets 198; Potassium 4.1; Sodium 141   Wt Readings from Last 3 Encounters:  05/05/19 168 lb 3.2 oz (76.3 kg)  05/04/19 169 lb (76.7 kg)  04/13/19 170 lb (77.1 kg)     Other studies Reviewed: Additional studies/ records that were reviewed today include: Echo 10/2018 shows LVEF 45-50, Previous EP office notes, Previous remote checks, Most recent labwork.   Assessment and Plan:  1. Cryptogenic Stroke s/p Medtronic Loop recorder PAF identified Burden 7.4% Remains on Elqiuis for CHA2DS2VASC of at least 6   Monitor not communication. Instructions given.  See Claudia Desanctis Art report No changes today BMET and CBC stable 04/13/2019  2. HTN Well controlled  3. HLD Crestor per PCP and neurology.  4. CAD Stable anginal symptoms on imdur. Saw Dr. Irish Lack earlier this week.  Current medicines are reviewed at length with the patient today.   The patient does not have concerns regarding his medicines.  The following changes were made today:  none  Labs/ tests ordered today include:  Orders Placed This Encounter  Procedures  . CUP PACEART INCLINIC DEVICE CHECK    Disposition:   Follow up with Dr. Curt Bears in 6 Months   Signed, Annamaria Helling  05/05/2019 11:30 AM  [place] [address] [place] [address] [phone] (office) [phone] (fax)

## 2019-05-08 ENCOUNTER — Ambulatory Visit (INDEPENDENT_AMBULATORY_CARE_PROVIDER_SITE_OTHER): Payer: Medicare HMO | Admitting: *Deleted

## 2019-05-08 DIAGNOSIS — I63432 Cerebral infarction due to embolism of left posterior cerebral artery: Secondary | ICD-10-CM

## 2019-05-09 NOTE — Progress Notes (Signed)
Carelink Summary Report / Loop Recorder 

## 2019-05-31 ENCOUNTER — Encounter: Payer: Self-pay | Admitting: Adult Health

## 2019-07-13 ENCOUNTER — Ambulatory Visit: Payer: Medicare HMO | Admitting: Adult Health

## 2019-07-22 ENCOUNTER — Other Ambulatory Visit: Payer: Self-pay | Admitting: Cardiology

## 2019-08-01 ENCOUNTER — Other Ambulatory Visit: Payer: Self-pay | Admitting: Internal Medicine

## 2019-08-01 DIAGNOSIS — R6884 Jaw pain: Secondary | ICD-10-CM

## 2019-08-01 DIAGNOSIS — W19XXXA Unspecified fall, initial encounter: Secondary | ICD-10-CM

## 2019-08-06 ENCOUNTER — Emergency Department (HOSPITAL_COMMUNITY): Payer: Medicare HMO

## 2019-08-06 ENCOUNTER — Inpatient Hospital Stay (HOSPITAL_COMMUNITY)
Admission: EM | Admit: 2019-08-06 | Discharge: 2019-08-11 | DRG: 064 | Disposition: A | Discharge status: Home Health | Payer: Medicare HMO | Attending: Internal Medicine | Admitting: Internal Medicine

## 2019-08-06 ENCOUNTER — Other Ambulatory Visit: Payer: Self-pay

## 2019-08-06 ENCOUNTER — Ambulatory Visit (HOSPITAL_COMMUNITY)
Admission: EM | Admit: 2019-08-06 | Discharge: 2019-08-06 | Disposition: A | Discharge status: Home / Self Care | Payer: Medicare HMO

## 2019-08-06 DIAGNOSIS — Z87891 Personal history of nicotine dependence: Secondary | ICD-10-CM

## 2019-08-06 DIAGNOSIS — Z79899 Other long term (current) drug therapy: Secondary | ICD-10-CM

## 2019-08-06 DIAGNOSIS — S2231XA Fracture of one rib, right side, initial encounter for closed fracture: Secondary | ICD-10-CM | POA: Diagnosis not present

## 2019-08-06 DIAGNOSIS — I509 Heart failure, unspecified: Secondary | ICD-10-CM

## 2019-08-06 DIAGNOSIS — I160 Hypertensive urgency: Secondary | ICD-10-CM | POA: Diagnosis present

## 2019-08-06 DIAGNOSIS — I429 Cardiomyopathy, unspecified: Secondary | ICD-10-CM | POA: Diagnosis present

## 2019-08-06 DIAGNOSIS — I619 Nontraumatic intracerebral hemorrhage, unspecified: Secondary | ICD-10-CM | POA: Diagnosis present

## 2019-08-06 DIAGNOSIS — Z7901 Long term (current) use of anticoagulants: Secondary | ICD-10-CM

## 2019-08-06 DIAGNOSIS — E43 Unspecified severe protein-calorie malnutrition: Secondary | ICD-10-CM | POA: Insufficient documentation

## 2019-08-06 DIAGNOSIS — K761 Chronic passive congestion of liver: Secondary | ICD-10-CM | POA: Diagnosis present

## 2019-08-06 DIAGNOSIS — Y929 Unspecified place or not applicable: Secondary | ICD-10-CM

## 2019-08-06 DIAGNOSIS — Z8249 Family history of ischemic heart disease and other diseases of the circulatory system: Secondary | ICD-10-CM

## 2019-08-06 DIAGNOSIS — R778 Other specified abnormalities of plasma proteins: Secondary | ICD-10-CM

## 2019-08-06 DIAGNOSIS — N179 Acute kidney failure, unspecified: Secondary | ICD-10-CM

## 2019-08-06 DIAGNOSIS — B1920 Unspecified viral hepatitis C without hepatic coma: Secondary | ICD-10-CM | POA: Diagnosis present

## 2019-08-06 DIAGNOSIS — Z9079 Acquired absence of other genital organ(s): Secondary | ICD-10-CM

## 2019-08-06 DIAGNOSIS — I428 Other cardiomyopathies: Secondary | ICD-10-CM | POA: Diagnosis present

## 2019-08-06 DIAGNOSIS — D6832 Hemorrhagic disorder due to extrinsic circulating anticoagulants: Secondary | ICD-10-CM | POA: Diagnosis present

## 2019-08-06 DIAGNOSIS — G9341 Metabolic encephalopathy: Secondary | ICD-10-CM | POA: Diagnosis present

## 2019-08-06 DIAGNOSIS — Z96641 Presence of right artificial hip joint: Secondary | ICD-10-CM | POA: Diagnosis present

## 2019-08-06 DIAGNOSIS — I10 Essential (primary) hypertension: Secondary | ICD-10-CM | POA: Diagnosis present

## 2019-08-06 DIAGNOSIS — S2239XA Fracture of one rib, unspecified side, initial encounter for closed fracture: Secondary | ICD-10-CM

## 2019-08-06 DIAGNOSIS — I77810 Thoracic aortic ectasia: Secondary | ICD-10-CM | POA: Diagnosis present

## 2019-08-06 DIAGNOSIS — I130 Hypertensive heart and chronic kidney disease with heart failure and stage 1 through stage 4 chronic kidney disease, or unspecified chronic kidney disease: Secondary | ICD-10-CM | POA: Diagnosis present

## 2019-08-06 DIAGNOSIS — R0603 Acute respiratory distress: Secondary | ICD-10-CM | POA: Diagnosis present

## 2019-08-06 DIAGNOSIS — R5383 Other fatigue: Secondary | ICD-10-CM

## 2019-08-06 DIAGNOSIS — H53462 Homonymous bilateral field defects, left side: Secondary | ICD-10-CM | POA: Diagnosis present

## 2019-08-06 DIAGNOSIS — R7989 Other specified abnormal findings of blood chemistry: Secondary | ICD-10-CM

## 2019-08-06 DIAGNOSIS — I618 Other nontraumatic intracerebral hemorrhage: Secondary | ICD-10-CM | POA: Diagnosis not present

## 2019-08-06 DIAGNOSIS — R29702 NIHSS score 2: Secondary | ICD-10-CM | POA: Diagnosis present

## 2019-08-06 DIAGNOSIS — G4733 Obstructive sleep apnea (adult) (pediatric): Secondary | ICD-10-CM | POA: Diagnosis present

## 2019-08-06 DIAGNOSIS — I5043 Acute on chronic combined systolic (congestive) and diastolic (congestive) heart failure: Secondary | ICD-10-CM | POA: Diagnosis present

## 2019-08-06 DIAGNOSIS — W1830XA Fall on same level, unspecified, initial encounter: Secondary | ICD-10-CM | POA: Diagnosis present

## 2019-08-06 DIAGNOSIS — Z8616 Personal history of COVID-19: Secondary | ICD-10-CM

## 2019-08-06 DIAGNOSIS — R7401 Elevation of levels of liver transaminase levels: Secondary | ICD-10-CM

## 2019-08-06 DIAGNOSIS — I739 Peripheral vascular disease, unspecified: Secondary | ICD-10-CM | POA: Diagnosis present

## 2019-08-06 DIAGNOSIS — N182 Chronic kidney disease, stage 2 (mild): Secondary | ICD-10-CM | POA: Diagnosis present

## 2019-08-06 DIAGNOSIS — M1990 Unspecified osteoarthritis, unspecified site: Secondary | ICD-10-CM | POA: Diagnosis present

## 2019-08-06 DIAGNOSIS — I959 Hypotension, unspecified: Secondary | ICD-10-CM | POA: Diagnosis not present

## 2019-08-06 DIAGNOSIS — T45515A Adverse effect of anticoagulants, initial encounter: Secondary | ICD-10-CM | POA: Diagnosis present

## 2019-08-06 DIAGNOSIS — E785 Hyperlipidemia, unspecified: Secondary | ICD-10-CM | POA: Diagnosis present

## 2019-08-06 DIAGNOSIS — Z8546 Personal history of malignant neoplasm of prostate: Secondary | ICD-10-CM

## 2019-08-06 DIAGNOSIS — Z20822 Contact with and (suspected) exposure to covid-19: Secondary | ICD-10-CM | POA: Diagnosis present

## 2019-08-06 DIAGNOSIS — I2510 Atherosclerotic heart disease of native coronary artery without angina pectoris: Secondary | ICD-10-CM | POA: Diagnosis present

## 2019-08-06 DIAGNOSIS — I252 Old myocardial infarction: Secondary | ICD-10-CM

## 2019-08-06 DIAGNOSIS — I480 Paroxysmal atrial fibrillation: Secondary | ICD-10-CM | POA: Diagnosis present

## 2019-08-06 DIAGNOSIS — I5021 Acute systolic (congestive) heart failure: Secondary | ICD-10-CM | POA: Diagnosis not present

## 2019-08-06 DIAGNOSIS — Z6825 Body mass index (BMI) 25.0-25.9, adult: Secondary | ICD-10-CM

## 2019-08-06 DIAGNOSIS — Z7902 Long term (current) use of antithrombotics/antiplatelets: Secondary | ICD-10-CM

## 2019-08-06 DIAGNOSIS — Z8673 Personal history of transient ischemic attack (TIA), and cerebral infarction without residual deficits: Secondary | ICD-10-CM

## 2019-08-06 DIAGNOSIS — I088 Other rheumatic multiple valve diseases: Secondary | ICD-10-CM | POA: Diagnosis present

## 2019-08-06 DIAGNOSIS — I69351 Hemiplegia and hemiparesis following cerebral infarction affecting right dominant side: Secondary | ICD-10-CM

## 2019-08-06 LAB — CBC
HCT: 39.2 % (ref 39.0–52.0)
Hemoglobin: 12.9 g/dL — ABNORMAL LOW (ref 13.0–17.0)
MCH: 30.1 pg (ref 26.0–34.0)
MCHC: 32.9 g/dL (ref 30.0–36.0)
MCV: 91.6 fL (ref 80.0–100.0)
Platelets: 183 10*3/uL (ref 150–400)
RBC: 4.28 MIL/uL (ref 4.22–5.81)
RDW: 14.9 % (ref 11.5–15.5)
WBC: 6 10*3/uL (ref 4.0–10.5)
nRBC: 0 % (ref 0.0–0.2)

## 2019-08-06 LAB — TROPONIN I (HIGH SENSITIVITY)
Troponin I (High Sensitivity): 35 ng/L — ABNORMAL HIGH (ref <18)
Troponin I (High Sensitivity): 35 ng/L — ABNORMAL HIGH (ref <18)

## 2019-08-06 LAB — COMPREHENSIVE METABOLIC PANEL
ALT: 65 U/L — ABNORMAL HIGH (ref 0–44)
AST: 53 U/L — ABNORMAL HIGH (ref 15–41)
Albumin: 3.4 g/dL — ABNORMAL LOW (ref 3.5–5.0)
Alkaline Phosphatase: 89 U/L (ref 38–126)
Anion gap: 11 (ref 5–15)
BUN: 25 mg/dL — ABNORMAL HIGH (ref 8–23)
CO2: 25 mmol/L (ref 22–32)
Calcium: 8.7 mg/dL — ABNORMAL LOW (ref 8.9–10.3)
Chloride: 106 mmol/L (ref 98–111)
Creatinine, Ser: 1.43 mg/dL — ABNORMAL HIGH (ref 0.61–1.24)
GFR calc Af Amer: 51 mL/min — ABNORMAL LOW (ref >60)
GFR calc non Af Amer: 44 mL/min — ABNORMAL LOW (ref >60)
Glucose, Bld: 123 mg/dL — ABNORMAL HIGH (ref 70–99)
Potassium: 3.4 mmol/L — ABNORMAL LOW (ref 3.5–5.1)
Sodium: 142 mmol/L (ref 135–145)
Total Bilirubin: 1.5 mg/dL — ABNORMAL HIGH (ref 0.3–1.2)
Total Protein: 6.8 g/dL (ref 6.5–8.1)

## 2019-08-06 LAB — URINALYSIS, ROUTINE W REFLEX MICROSCOPIC
Bacteria, UA: NONE SEEN
Bilirubin Urine: NEGATIVE
Glucose, UA: NEGATIVE mg/dL
Ketones, ur: NEGATIVE mg/dL
Leukocytes,Ua: NEGATIVE
Nitrite: NEGATIVE
Protein, ur: NEGATIVE mg/dL
Specific Gravity, Urine: 1.011 (ref 1.005–1.030)
pH: 6 (ref 5.0–8.0)

## 2019-08-06 LAB — SARS CORONAVIRUS 2 BY RT PCR (HOSPITAL ORDER, PERFORMED IN ~~LOC~~ HOSPITAL LAB): SARS Coronavirus 2: NEGATIVE

## 2019-08-06 LAB — BRAIN NATRIURETIC PEPTIDE: B Natriuretic Peptide: 1349.7 pg/mL — ABNORMAL HIGH (ref 0.0–100.0)

## 2019-08-06 MED ORDER — SODIUM CHLORIDE 0.9% FLUSH: 3.0000 mL | INTRAVENOUS | Status: DC | PRN

## 2019-08-06 MED ORDER — POTASSIUM CHLORIDE CRYS ER 20 MEQ PO TBCR
40.0000 meq | EXTENDED_RELEASE_TABLET | Freq: Every day | ORAL | Status: DC
  Administered 2019-08-07 (×2): 40 meq via ORAL
  Filled 2019-08-06 (×2): qty 2

## 2019-08-06 MED ORDER — SODIUM CHLORIDE 0.9% FLUSH
3.0000 mL | Freq: Two times a day (BID) | INTRAVENOUS | Status: DC
  Administered 2019-08-08 – 2019-08-10 (×6): 3 mL via INTRAVENOUS

## 2019-08-06 MED ORDER — ONDANSETRON HCL 4 MG/2ML IJ SOLN: 4.0000 mg | Freq: Four times a day (QID) | INTRAMUSCULAR | Status: DC | PRN

## 2019-08-06 MED ORDER — APIXABAN 5 MG PO TABS
5.0000 mg | ORAL_TABLET | Freq: Two times a day (BID) | ORAL | Status: DC
  Administered 2019-08-07 (×2): 5 mg via ORAL
  Filled 2019-08-06 (×3): qty 1

## 2019-08-06 MED ORDER — ATORVASTATIN CALCIUM 40 MG PO TABS
40.0000 mg | ORAL_TABLET | Freq: Every day | ORAL | Status: DC
  Administered 2019-08-07 – 2019-08-10 (×5): 40 mg via ORAL
  Filled 2019-08-06 (×5): qty 1

## 2019-08-06 MED ORDER — ISOSORBIDE MONONITRATE ER 30 MG PO TB24
30.0000 mg | ORAL_TABLET | Freq: Every day | ORAL | Status: DC
  Administered 2019-08-07 – 2019-08-08 (×2): 30 mg via ORAL
  Filled 2019-08-06 (×2): qty 1

## 2019-08-06 MED ORDER — SODIUM CHLORIDE (PF) 0.9 % IJ SOLN
INTRAMUSCULAR | Status: AC
  Administered 2019-08-06: 3 mL via INTRAVENOUS
  Filled 2019-08-06: qty 50

## 2019-08-06 MED ORDER — SODIUM CHLORIDE 0.9 % IV SOLN: 250.0000 mL | INTRAVENOUS | Status: DC | PRN

## 2019-08-06 MED ORDER — IOHEXOL 350 MG/ML SOLN
100.0000 mL | Freq: Once | INTRAVENOUS | Status: AC | PRN
  Administered 2019-08-06: 100 mL via INTRAVENOUS

## 2019-08-06 MED ORDER — CLOPIDOGREL BISULFATE 75 MG PO TABS
75.0000 mg | ORAL_TABLET | Freq: Every day | ORAL | Status: DC
  Administered 2019-08-07: 75 mg via ORAL
  Filled 2019-08-06: qty 1

## 2019-08-06 MED ORDER — ACETAMINOPHEN 325 MG PO TABS: 650.0000 mg | ORAL_TABLET | ORAL | Status: DC | PRN

## 2019-08-06 MED ORDER — SODIUM CHLORIDE 0.9% FLUSH: 3.0000 mL | Freq: Once | INTRAVENOUS | Status: DC

## 2019-08-06 MED ORDER — FUROSEMIDE 10 MG/ML IJ SOLN
40.0000 mg | Freq: Once | INTRAMUSCULAR | Status: AC
  Administered 2019-08-06: 40 mg via INTRAVENOUS
  Filled 2019-08-06: qty 4

## 2019-08-06 MED ORDER — FUROSEMIDE 10 MG/ML IJ SOLN
40.0000 mg | Freq: Every day | INTRAMUSCULAR | Status: DC
  Administered 2019-08-07: 40 mg via INTRAVENOUS
  Filled 2019-08-06: qty 4

## 2019-08-06 MED ORDER — METOPROLOL TARTRATE 50 MG PO TABS
50.0000 mg | ORAL_TABLET | Freq: Two times a day (BID) | ORAL | Status: DC
  Administered 2019-08-07 – 2019-08-08 (×4): 50 mg via ORAL
  Filled 2019-08-06 (×2): qty 2
  Filled 2019-08-06: qty 1
  Filled 2019-08-06: qty 2

## 2019-08-06 NOTE — ED Triage Notes (Addendum)
Arrived POV from home. Patient reports fatigue X2 -3 weeks. Patient states he does not seem to have the energy to get out of bed at night to go to the bathroom. Patient also reports bilateral leg swelling and recent fall.

## 2019-08-06 NOTE — ED Notes (Signed)
Blue top sent as save tube along with other tubes.

## 2019-08-06 NOTE — ED Provider Notes (Signed)
[facility] DEPT Provider Note   CSN: [id number] Arrival date & time: 08/06/19  1618     History Chief Complaint  Patient presents with  . Fatigue    Brian Brown is a 86 y.o. male.  Patient brought in by family member.  Patient's had several week history of fatigue and weakness and leg swelling.  We also noted that when he got moved to the gurney that his sats went down to 88 to 87%.  But then pain came back up normal sats on room air.  Med list shows the patient is on 20 mg of Lasix Thana Farr as needed.  Past medical history is significant for cardiomyopathy chronic kidney disease stage II history of atrial fib coronary artery disease and non-STEMI in October 2020.  His ejection fraction is about 45 to 50% by echo in October 2020.  Followed by Dr. Illene Bolus from cardiology.  Also has a history of hypertension stroke in 25 with some right-sided weakness some right leg.        Past Medical History:  Diagnosis Date  . Arthritis    degenerative joint disease  . Bleeding stomach ulcer 25 yrs ago  . CAD (coronary artery disease)    a. NSTEMI 10/2018 thrombotic lesion to OM2, 90% D1 (medical therapy), otherwise nonobstructive disease.  . Cancer [facility]) 2002   prostate, s/p surgery  . Cardiomyopathy ([place])    a. EF 45-50 by echo 10/2018.  Marland Kitchen CKD (chronic kidney disease), stage II   . Hepatitis 1981   does not know what kind;denies jaundice  . Hypertension    treated by Dr Delfina Redwood, primary MD  . Mild dilation of ascending aorta ([place])   . PAF (paroxysmal atrial fibrillation) ([place])   . Sleep apnea 2005   does not wear a Cpap  . Stroke ([place]) 2003   w/ Rt sided weakness-some-rt leg  . Wears dentures    full top  . Wears glasses     Patient Active Problem List   Diagnosis Date Noted  . Acute CHF (congestive heart failure) ([place]) 08/06/2019  . AKI (acute kidney injury) ([place]) 08/06/2019  . Elevated troponin 08/06/2019  . Transaminitis 08/06/2019  .  Closed rib fracture 08/06/2019  . History of CVA (cerebrovascular accident) 08/06/2019  . PAF (paroxysmal atrial fibrillation) ([place]) 10/20/2018  . NSTEMI (non-ST elevated myocardial infarction) ([place])   . Acute CVA (cerebrovascular accident) ([place]) 02/14/2018  . TIA (transient ischemic attack) 02/13/2018  . Hepatitis C reactive 02/06/2011  . Hyponatremia 02/06/2011  . Postoperative anemia due to acute blood loss 02/05/2011  . Hypokalemia 02/05/2011  . Osteoarthritis of hip 01/27/2011  . Hypertension 01/27/2011  . Sleep apnea 01/27/2011  . Stroke/cerebrovascular accident ([place]) 01/27/2011  . Cancer of prostate ([place]) 01/27/2011    Past Surgical History:  Procedure Laterality Date  . BUBBLE STUDY  09/16/2018   Procedure: BUBBLE STUDY;  Surgeon: Lelon Perla, MD;  Location: [facility];  Service: Cardiovascular;;  . CARDIAC CATHETERIZATION  10/19/2018  . CARPAL TUNNEL RELEASE Left 03/29/2013   Procedure: LEFT CARPAL TUNNEL RELEASE;  Surgeon: Wynonia Sours, MD;  Location: [facility];  Service: Orthopedics;  Laterality: Left;  ANESTHESIA: IV REGIONAL FAB  . CARPAL TUNNEL RELEASE Right 03/06/2014   Procedure: RIGHT CARPAL TUNNEL RELEASE;  Surgeon: Daryll Brod, MD;  Location: [facility];  Service: Orthopedics;  Laterality: Right;  . COLONOSCOPY    . HERNIA REPAIR  2009   rt inguinal hernia  .  JOINT REPLACEMENT  12/2001   rt hip ;[place] CATH AND CORONARY ANGIOGRAPHY N/A 10/19/2018   Procedure: LEFT HEART CATH AND CORONARY ANGIOGRAPHY;  Surgeon: Wellington Hampshire, MD;  Location: [facility] CV [facility];  Service: Cardiovascular;  Laterality: N/A;  . LOOP RECORDER INSERTION N/A 09/16/2018   Procedure: LOOP RECORDER INSERTION;  Surgeon: Constance Haw, MD;  Location: [facility];  Service: Cardiovascular;  Laterality: N/A;  . PROSTATECTOMY  2002  . SHOULDER ARTHROSCOPY W/ ROTATOR CUFF REPAIR  2014   lt-dr whitfield  . TEE WITHOUT  CARDIOVERSION N/A 09/16/2018   Procedure: TRANSESOPHAGEAL ECHOCARDIOGRAM (TEE);  Surgeon: Lelon Perla, MD;  Location: [facility] ENDOSCOPY;  Service: Cardiovascular;  Laterality: N/A;  . TOTAL HIP ARTHROPLASTY  02/03/2011   Procedure: TOTAL HIP ARTHROPLASTY;  Surgeon: Garald Balding, MD;  Location: [facility];  Service: Orthopedics;  Laterality: Left;  . TRIGGER FINGER RELEASE Left 03/29/2013   Procedure: LEFT MIDDLE/SMALL FINGER RELEASE A-1 PULLEY AND LEFT RING FINGER;  Surgeon: Wynonia Sours, MD;  Location: [place] [facility];  Service: Orthopedics;  Laterality: Left;  . TRIGGER FINGER RELEASE Right 03/06/2014   Procedure: RIGHT MIDDLE FINGER,RIGHT RINGER FINGER;  Surgeon: Daryll Brod, MD;  Location: [facility] [place];  Service: Orthopedics;  Laterality: Right;       Family History  Problem Relation Age of Onset  . Heart disease Mother   . Heart disease Father     Social History   Tobacco Use  . Smoking status: Former Smoker    Packs/day: 1.00    Years: 30.00    Pack years: 30.00    Types: Cigarettes    Quit date: 01/27/2008    Years since quitting: 11.5  . Smokeless tobacco: Never Used  Vaping Use  . Vaping Use: Never used  Substance Use Topics  . Alcohol use: No  . Drug use: No    Home Medications Prior to Admission medications   Medication Sig Start Date End Date Taking? Authorizing Provider  apixaban (ELIQUIS) 5 MG TABS tablet Take 1 tablet (5 mg total) by mouth 2 (two) times daily. 11/22/18  Yes Jettie Booze, MD  atorvastatin (LIPITOR) 40 MG tablet TAKE 1 TABLET EVERY DAY Patient taking differently: Take 40 mg by mouth at bedtime.  07/24/19  Yes Jettie Booze, MD  clopidogrel (PLAVIX) 75 MG tablet Take 1 tablet (75 mg total) by mouth daily with breakfast. 11/22/18  Yes Jettie Booze, MD  furosemide (LASIX) 20 MG tablet Take 1 tablet (20 mg total) by mouth daily as needed for fluid. Patient taking differently: Take 20 mg by mouth daily.   11/22/18  Yes Jettie Booze, MD  ibuprofen (ADVIL) 200 MG tablet Take 200 mg by mouth daily as needed for headache.   Yes [provider]  isosorbide mononitrate (IMDUR) 30 MG 24 hr tablet Take 1 tablet (30 mg total) by mouth daily. 11/22/18  Yes Jettie Booze, MD  latanoprost (XALATAN) 0.005 % ophthalmic solution Place 1 drop into both eyes at bedtime.   Yes [provider]  lisinopril (ZESTRIL) 10 MG tablet Take 1 tablet (10 mg total) by mouth daily. 11/22/18 11/22/19 Yes Jettie Booze, MD  metoprolol tartrate (LOPRESSOR) 25 MG tablet Take 3 tablets (75 mg total) by mouth 2 (two) times daily. Patient taking differently: Take 50 mg by mouth 2 (two) times daily.  02/23/19  Yes Camnitz, Ocie Doyne, MD  nitroGLYCERIN (NITROSTAT) 0.4 MG  SL tablet Place 1 tablet (0.4 mg total) under the tongue every 5 (five) minutes x 3 doses as needed for chest pain. 11/22/18  Yes Jettie Booze, MD  OVER THE COUNTER MEDICATION Take 2-3 capsules by mouth See admin instructions. Omega XL - take 3 capsules by mouth every morning and 2 capsules at night   Yes [provider]    Allergies    Patient has no known allergies.  Review of Systems   Review of Systems  Constitutional: Positive for fatigue. Negative for chills and fever.  HENT: Negative for congestion, rhinorrhea and sore throat.   Eyes: Negative for visual disturbance.  Respiratory: Negative for cough and shortness of breath.   Cardiovascular: Positive for leg swelling. Negative for chest pain.  Gastrointestinal: Positive for abdominal pain. Negative for diarrhea, nausea and vomiting.  Genitourinary: Negative for dysuria.  Musculoskeletal: Negative for back pain and neck pain.  Skin: Negative for rash.  Neurological: Positive for weakness. Negative for dizziness, light-headedness and headaches.  Hematological: Does not bruise/bleed easily.  Psychiatric/Behavioral: Negative for confusion.     Physical Exam Updated Vital Signs BP (!) 181/122   Pulse 87   Temp 97.9 F (36.6 C) (Oral)   Resp (!) 25   Ht 1.727 m (5\' 8" )   Wt 74.8 kg   SpO2 94%   BMI 25.09 kg/m   Physical Exam Vitals and nursing note reviewed.  Constitutional:      Appearance: Normal appearance. He is well-developed.  HENT:     Head: Normocephalic and atraumatic.  Eyes:     Extraocular Movements: Extraocular movements intact.     Conjunctiva/sclera: Conjunctivae normal.     Pupils: Pupils are equal, round, and reactive to light.  Cardiovascular:     Rate and Rhythm: Normal rate and regular rhythm.     Heart sounds: No murmur heard.   Pulmonary:     Effort: Pulmonary effort is normal. No respiratory distress.     Breath sounds: Normal breath sounds.  Abdominal:     Palpations: Abdomen is soft.     Tenderness: There is no abdominal tenderness.  Musculoskeletal:        General: Normal range of motion.     Cervical back: Neck supple.     Right lower leg: Edema present.     Left lower leg: Edema present.  Skin:    General: Skin is warm and dry.  Neurological:     General: No focal deficit present.     Mental Status: He is alert. Mental status is at baseline.     Cranial Nerves: No cranial nerve deficit.     Sensory: No sensory deficit.     Motor: No weakness.     ED Results / Procedures / Treatments   Labs (all labs ordered are listed, but only abnormal results are displayed) Labs Reviewed  CBC - Abnormal; Notable for the following components:      Result Value   Hemoglobin 12.9 (*)    All other components within normal limits  URINALYSIS, ROUTINE W REFLEX MICROSCOPIC - Abnormal; Notable for the following components:   Hgb urine dipstick SMALL (*)    All other components within normal limits  COMPREHENSIVE METABOLIC PANEL - Abnormal; Notable for the following components:   Potassium 3.4 (*)    Glucose, Bld 123 (*)    BUN 25 (*)    Creatinine, Ser 1.43 (*)    Calcium 8.7 (*)     Albumin 3.4 (*)  AST 53 (*)    ALT 65 (*)    Total Bilirubin 1.5 (*)    GFR calc non Af Amer 44 (*)    GFR calc Af Amer 51 (*)    All other components within normal limits  BRAIN NATRIURETIC PEPTIDE - Abnormal; Notable for the following components:   B Natriuretic Peptide 1,349.7 (*)    All other components within normal limits  TROPONIN I (HIGH SENSITIVITY) - Abnormal; Notable for the following components:   Troponin I (High Sensitivity) 35 (*)    All other components within normal limits  TROPONIN I (HIGH SENSITIVITY) - Abnormal; Notable for the following components:   Troponin I (High Sensitivity) 35 (*)    All other components within normal limits  SARS CORONAVIRUS 2 BY RT PCR (HOSPITAL ORDER, [facility])  BASIC METABOLIC PANEL    EKG EKG Interpretation  Date/Time:  Sunday August 06 2019 16:38:41 EDT Ventricular Rate:  79 PR Interval:    QRS Duration: 95 QT Interval:  436 QTC Calculation: 484 R Axis:   -15 Text Interpretation: Atrial fibrillation Paired ventricular premature complexes Borderline left axis deviation Low voltage, precordial leads Borderline prolonged QT interval 12 Lead; Mason-Likar biphasic t waves in anterior leads not seen on prior Otherwise no significant change Confirmed by Deno Etienne [phone]) on 08/06/2019 5:22:45 PM   Radiology CT Angio Chest PE W/Cm &/Or Wo Cm  Result Date: 08/06/2019 CLINICAL DATA:  Fatigue for 2- 3 weeks recent fall EXAM: CT ANGIOGRAPHY CHEST WITH CONTRAST TECHNIQUE: Multidetector CT imaging of the chest was performed using the standard protocol during bolus administration of intravenous contrast. Multiplanar CT image reconstructions and MIPs were obtained to evaluate the vascular anatomy. CONTRAST:  168mL OMNIPAQUE IOHEXOL 350 MG/ML SOLN COMPARISON:  None. FINDINGS: Cardiovascular: There is a optimal opacification of the pulmonary arteries. There is equivocal partial non opacification of a small subsegmental  posterior right lower lobe pulmonary artery best seen on series 3, image 175. No segmental or central pulmonary embolism is seen. No left-sided filling defects are noted. There is mild cardiomegaly. Coronary artery calcifications are seen. No pericardial effusion or thickening. No evidence right heart strain. There is normal three-vessel brachiocephalic anatomy without proximal stenosis. Scattered aortic atherosclerosis is noted. Mediastinum/Nodes: No hilar, mediastinal, or axillary adenopathy. Thyroid gland, trachea, and esophagus demonstrate no significant findings. Lungs/Pleura: Small bilateral pleural effusions are present, right greater than left with hazy adjacent atelectasis. Musculoskeletal: There is a nondisplaced lateral right fourth rib fracture present. Review of the MIP images confirms the above findings. Abdomen/pelvis: Hepatobiliary: Multiple low-density lesions are seen throughout the liver parenchyma the largest within the right liver lobe measuring 3.6 cm. Main portal vein is patent. Partially calcified gallstones are present. No intrahepatic biliary ductal dilatation. Evidence of calcified gallstones, gallbladder wall thickening or biliary dilatation. Pancreas: Unremarkable. No pancreatic ductal dilatation or surrounding inflammatory changes. Spleen: Normal in size without focal abnormality. Adrenals/Urinary Tract: Both adrenal glands appear normal. Bilateral low-density lesions are seen within both kidneys the largest partially exophytic off the upper pole of the left kidney measuring 7.8 cm. No renal or collecting system calculi. No hydronephrosis. The bladder is grossly unremarkable. Stomach/Bowel: The stomach, small bowel, and colon are normal in appearance. No inflammatory changes, wall thickening, or obstructive findings.Scattered colonic diverticula are noted. Vascular/Lymphatic: There are no enlarged mesenteric, retroperitoneal, or pelvic lymph nodes. Scattered aortic atherosclerotic  calcifications are seen without aneurysmal dilatation. Reproductive: Suboptimally visualized due to overlying streak artifact. Other: No  evidence of abdominal wall mass or hernia. Musculoskeletal: No acute or significant osseous findings. Bilateral total hip arthroplasties are present. Degenerative changes are seen in the thoracolumbar spine. IMPRESSION: 1. Equivocal tiny filling defect in the posterior right lower lobe subsegmental pulmonary artery which could represent a small pulmonary embolism versus non opacification. No large or central pulmonary embolism. 2. Small bilateral pleural effusions and dependent atelectasis 3. No acute intra-abdominal or pelvic pathology to explain the patient's symptoms 4. Nondisplaced lateral right fourth rib fracture 5.  Aortic Atherosclerosis (ICD10-I70.0). Electronically Signed   By: Prudencio Pair M.D.   On: 08/06/2019 21:21   CT Abdomen Pelvis W Contrast  Result Date: 08/06/2019 CLINICAL DATA:  Fatigue for 2- 3 weeks recent fall EXAM: CT ANGIOGRAPHY CHEST WITH CONTRAST TECHNIQUE: Multidetector CT imaging of the chest was performed using the standard protocol during bolus administration of intravenous contrast. Multiplanar CT image reconstructions and MIPs were obtained to evaluate the vascular anatomy. CONTRAST:  168mL OMNIPAQUE IOHEXOL 350 MG/ML SOLN COMPARISON:  None. FINDINGS: Cardiovascular: There is a optimal opacification of the pulmonary arteries. There is equivocal partial non opacification of a small subsegmental posterior right lower lobe pulmonary artery best seen on series 3, image 175. No segmental or central pulmonary embolism is seen. No left-sided filling defects are noted. There is mild cardiomegaly. Coronary artery calcifications are seen. No pericardial effusion or thickening. No evidence right heart strain. There is normal three-vessel brachiocephalic anatomy without proximal stenosis. Scattered aortic atherosclerosis is noted. Mediastinum/Nodes: No  hilar, mediastinal, or axillary adenopathy. Thyroid gland, trachea, and esophagus demonstrate no significant findings. Lungs/Pleura: Small bilateral pleural effusions are present, right greater than left with hazy adjacent atelectasis. Musculoskeletal: There is a nondisplaced lateral right fourth rib fracture present. Review of the MIP images confirms the above findings. Abdomen/pelvis: Hepatobiliary: Multiple low-density lesions are seen throughout the liver parenchyma the largest within the right liver lobe measuring 3.6 cm. Main portal vein is patent. Partially calcified gallstones are present. No intrahepatic biliary ductal dilatation. Evidence of calcified gallstones, gallbladder wall thickening or biliary dilatation. Pancreas: Unremarkable. No pancreatic ductal dilatation or surrounding inflammatory changes. Spleen: Normal in size without focal abnormality. Adrenals/Urinary Tract: Both adrenal glands appear normal. Bilateral low-density lesions are seen within both kidneys the largest partially exophytic off the upper pole of the left kidney measuring 7.8 cm. No renal or collecting system calculi. No hydronephrosis. The bladder is grossly unremarkable. Stomach/Bowel: The stomach, small bowel, and colon are normal in appearance. No inflammatory changes, wall thickening, or obstructive findings.Scattered colonic diverticula are noted. Vascular/Lymphatic: There are no enlarged mesenteric, retroperitoneal, or pelvic lymph nodes. Scattered aortic atherosclerotic calcifications are seen without aneurysmal dilatation. Reproductive: Suboptimally visualized due to overlying streak artifact. Other: No evidence of abdominal wall mass or hernia. Musculoskeletal: No acute or significant osseous findings. Bilateral total hip arthroplasties are present. Degenerative changes are seen in the thoracolumbar spine. IMPRESSION: 1. Equivocal tiny filling defect in the posterior right lower lobe subsegmental pulmonary artery which  could represent a small pulmonary embolism versus non opacification. No large or central pulmonary embolism. 2. Small bilateral pleural effusions and dependent atelectasis 3. No acute intra-abdominal or pelvic pathology to explain the patient's symptoms 4. Nondisplaced lateral right fourth rib fracture 5.  Aortic Atherosclerosis (ICD10-I70.0). Electronically Signed   By: Prudencio Pair M.D.   On: 08/06/2019 21:21   DG Chest Port 1 View  Result Date: 08/06/2019 CLINICAL DATA:  Fatigue EXAM: PORTABLE CHEST 1 VIEW COMPARISON:  April 13, 2019 FINDINGS: There  is unchanged cardiomegaly. Aortic knob calcifications are seen. Trace right pleural effusion is seen. There is mild prominence of the central pulmonary vasculature. Overlying loop recorder is seen. No acute osseous abnormality. IMPRESSION: Mild pulmonary vascular congestion and trace right pleural effusion. Electronically Signed   By: Prudencio Pair M.D.   On: 08/06/2019 20:00    Procedures Procedures (including critical care time)  CRITICAL CARE Performed by: Fredia Sorrow Total critical care time: 35 minutes Critical care time was exclusive of separately billable procedures and treating other patients. Critical care was necessary to treat or prevent imminent or life-threatening deterioration. Critical care was time spent personally by me on the following activities: development of treatment plan with patient and/or surrogate as well as nursing, discussions with consultants, evaluation of patient's response to treatment, examination of patient, obtaining history from patient or surrogate, ordering and performing treatments and interventions, ordering and review of laboratory studies, ordering and review of radiographic studies, pulse oximetry and re-evaluation of patient's condition.   Medications Ordered in ED Medications  sodium chloride flush (NS) 0.9 % injection 3 mL (0 mLs Intravenous Hold 08/06/19 1925)  sodium chloride flush (NS) 0.9 %  injection 3 mL (0 mLs Intravenous Hold 08/06/19 2215)  sodium chloride flush (NS) 0.9 % injection 3 mL (3 mLs Intravenous Given by Other 08/06/19 2204)  0.9 %  sodium chloride infusion (has no administration in time range)  acetaminophen (TYLENOL) tablet 650 mg (has no administration in time range)  ondansetron (ZOFRAN) injection 4 mg (has no administration in time range)  furosemide (LASIX) injection 40 mg (has no administration in time range)  atorvastatin (LIPITOR) tablet 40 mg (has no administration in time range)  isosorbide mononitrate (IMDUR) 24 hr tablet 30 mg (has no administration in time range)  metoprolol tartrate (LOPRESSOR) tablet 50 mg (has no administration in time range)  apixaban (ELIQUIS) tablet 5 mg (has no administration in time range)  clopidogrel (PLAVIX) tablet 75 mg (has no administration in time range)  furosemide (LASIX) injection 40 mg (40 mg Intravenous Given 08/06/19 2055)  iohexol (OMNIPAQUE) 350 MG/ML injection 100 mL (100 mLs Intravenous Contrast Given 08/06/19 2035)    ED Course  I have reviewed the triage vital signs and the nursing notes.  Pertinent labs & imaging results that were available during my care of the patient were reviewed by me and considered in my medical decision making (see chart for details).    MDM Rules/Calculators/A&P                             Chest x-ray shows evidence of pulmonary edema.  BNP is elevated at 1300.  Patient has bilateral leg edema.  Has medical history makes mention of cardiomyopathy with no history of congestive heart failure.  Initial troponin is elevated at 30 5 repeat troponin is pending.  35 seems to be in the range of where he normally is at.  Despite patient's history of a prior stroke and supposedly and right-sided weakness on exam strength seems to be equal bilaterally upper extremities and lower extremities.  Patient is on Lasix just as needed.  We will going give 40 of Lasix IV.  Recheck second  troponin.  Patient did have a decrease in oxygen saturation when he was moved.  Perhaps with exertion it goes down he went down to 87%.  His oxygen sats on room air at rest are very good.  Patient's cardiologist is Dr. Dreama Saa.  CT chest showed small bilateral effusions.  No evidence of pulmonary embolus other than may be a very small deficit.  But patient is on Eliquis for his atrial fib.  Patient without a history of congestive heart failure.  Does have a history of cardiomyopathy.  Patient's troponins x2 are stable.  At 35.  Patient given 40 mg of IV Lasix.  Patient with marked lower extremity edema.  Discussed with the hospitalist they will admit for CHF.  Nuclear feel that the a small filling defect, on CT angio although could represent a small pulmonary embolus is not clinically significant at this time.  Plus patient is already on Eliquis.  Patient's Covid testing was negative.  Final C patient's chestlinical Impression(s) / ED Diagnoses Final diagnoses:  Acute congestive heart failure, unspecified heart failure type (HCC)  Fatigue, unspecified type    Rx / DC Orders ED Discharge Orders    None       Fredia Sorrow, MD 08/06/19 2244

## 2019-08-06 NOTE — H&P (Signed)
History and Physical    Brian Brown NID:[id number] DOB: 13-Jan-1932 DOA: 08/06/2019  PCP: Seward Carol, MD  Patient coming from: Home  I have personally briefly reviewed patient's old medical records in [place]  Chief Complaint: Fatigue   HPI: Brian Brown is a 86 y.o. male with medical history significant for CVA, CAD s/p NSTEMI, cardiomyopathy, paroxysmal atrial fibrillation on Eliquis, OSA not on CPAP, hypertension, hepatitis C who presents with concerns of worsening fatigue.  For the past month he has felt progressive fatigue.  He usually walks his dog about twice a day but for the past 2 weeks he could no longer do that.  He felt short of breath just doing dishes or making his bed.  He also had a fall last week taking trash out and felt like his "hips slipped out from under him."  Denies any lightheadedness or loss of consciousness.  He has also been having on and off lower extremity edema with the right worse than the left ever since he had a stroke last year.  However in the past 2 to 3 days he noticed that both legs have been significantly worse.  He takes Lasix daily.He also is on Eliquis for paroxysmal atrial fibrillation denies any missed doses. Otherwise he denies chest pain, orthopnea, nausea vomiting or diarrhea. Pt lives alone but has family that checks on him on a regarding basis.   He was afebrile and hypertensive with systolics of 144R over 154M.  Troponin mildly elevated at 35 although generally run in the 50s in the past.  BNP of 1249.  K of 3.4.  Creatinine of 1.43 elevated from 1.15 from several months ago.  Mildly elevated AST of 53, ALT of 65, total bilirubin of 1.5  Chest x-ray shows mild pulmonary vascular congestion and trace right pleural effusion.  CTA shows equivocal tiny filling defect in posterior right lower lobe.  Small bilateral pleural effusion.  Nondisplaced lateral right fourth rib fracture   Review of Systems:  Constitutional: No Weight  Change, No Fever ENT/Mouth: No sore throat, No Rhinorrhea Eyes: No Eye Pain, No Vision Changes Cardiovascular: No Chest Pain, +SOB, No PND, + Dyspnea on Exertion, No Orthopnea, + Edema, No Palpitations Respiratory: No Cough, No Sputum Gastrointestinal: No Nausea, No Vomiting, No Diarrhea, No Constipation, No Pain Genitourinary: no Urinary Incontinence Musculoskeletal: No Arthralgias, No Myalgias Skin: No Skin Lesions, No Pruritus, Neuro: + Weakness, No Numbness,  No Loss of Consciousness, No Syncope Psych: No Anxiety/Panic, No Depression, no decrease appetite Heme/Lymph: No Bruising, No Bleeding  Past Medical History:  Diagnosis Date  . Arthritis    degenerative joint disease  . Bleeding stomach ulcer 25 yrs ago  . CAD (coronary artery disease)    a. NSTEMI 10/2018 thrombotic lesion to OM2, 90% D1 (medical therapy), otherwise nonobstructive disease.  . Cancer [facility]) 2002   prostate, s/p surgery  . Cardiomyopathy ([place])    a. EF 45-50 by echo 10/2018.  Marland Kitchen CKD (chronic kidney disease), stage II   . Hepatitis 1981   does not know what kind;denies jaundice  . Hypertension    treated by Dr Delfina Redwood, primary MD  . Mild dilation of ascending aorta ([place])   . PAF (paroxysmal atrial fibrillation) ([place])   . Sleep apnea 2005   does not wear a Cpap  . Stroke ([place]) 2003   w/ Rt sided weakness-some-rt leg  . Wears dentures    full top  . Wears glasses     Past  Surgical History:  Procedure Laterality Date  . BUBBLE STUDY  09/16/2018   Procedure: BUBBLE STUDY;  Surgeon: Lelon Perla, MD;  Location: [place];  Service: Cardiovascular;;  . CARDIAC CATHETERIZATION  10/19/2018  . CARPAL TUNNEL RELEASE Left 03/29/2013   Procedure: LEFT CARPAL TUNNEL RELEASE;  Surgeon: Wynonia Sours, MD;  Location: [place];  Service: Orthopedics;  Laterality: Left;  ANESTHESIA: IV REGIONAL FAB  . CARPAL TUNNEL RELEASE Right 03/06/2014   Procedure: RIGHT CARPAL TUNNEL RELEASE;  Surgeon: Daryll Brod, MD;  Location: [place];  Service: Orthopedics;  Laterality: Right;  . COLONOSCOPY    . HERNIA REPAIR  2009   rt inguinal hernia  . JOINT REPLACEMENT  12/2001   rt hip ;[place] CATH AND CORONARY ANGIOGRAPHY N/A 10/19/2018   Procedure: LEFT HEART CATH AND CORONARY ANGIOGRAPHY;  Surgeon: Wellington Hampshire, MD;  Location: [place] CV LAB;  Service: Cardiovascular;  Laterality: N/A;  . LOOP RECORDER INSERTION N/A 09/16/2018   Procedure: LOOP RECORDER INSERTION;  Surgeon: Constance Haw, MD;  Location: [place] CV LAB;  Service: Cardiovascular;  Laterality: N/A;  . PROSTATECTOMY  2002  . SHOULDER ARTHROSCOPY W/ ROTATOR CUFF REPAIR  2014   lt-dr whitfield  . TEE WITHOUT CARDIOVERSION N/A 09/16/2018   Procedure: TRANSESOPHAGEAL ECHOCARDIOGRAM (TEE);  Surgeon: Lelon Perla, MD;  Location: [facility] ENDOSCOPY;  Service: Cardiovascular;  Laterality: N/A;  . TOTAL HIP ARTHROPLASTY  02/03/2011   Procedure: TOTAL HIP ARTHROPLASTY;  Surgeon: Garald Balding, MD;  Location: [place];  Service: Orthopedics;  Laterality: Left;  . TRIGGER FINGER RELEASE Left 03/29/2013   Procedure: LEFT MIDDLE/SMALL FINGER RELEASE A-1 PULLEY AND LEFT RING FINGER;  Surgeon: Wynonia Sours, MD;  Location: [place];  Service: Orthopedics;  Laterality: Left;  . TRIGGER FINGER RELEASE Right 03/06/2014   Procedure: RIGHT MIDDLE FINGER,RIGHT RINGER FINGER;  Surgeon: Daryll Brod, MD;  Location: [place];  Service: Orthopedics;  Laterality: Right;     Social History Patient lives alone but has family that come to check on him. Reports that he quit smoking about a month and a half ago.  Occasional alcohol use.  Denies illicit drug use.  No Known Allergies  Family History  Problem Relation Age of Onset  . Heart disease Mother   . Heart disease Father      Prior to Admission medications   Medication Sig Start Date End Date Taking? Authorizing  Provider  apixaban (ELIQUIS) 5 MG TABS tablet Take 1 tablet (5 mg total) by mouth 2 (two) times daily. 11/22/18  Yes Jettie Booze, MD  atorvastatin (LIPITOR) 40 MG tablet TAKE 1 TABLET EVERY DAY Patient taking differently: Take 40 mg by mouth at bedtime.  07/24/19  Yes Jettie Booze, MD  clopidogrel (PLAVIX) 75 MG tablet Take 1 tablet (75 mg total) by mouth daily with breakfast. 11/22/18  Yes Jettie Booze, MD  furosemide (LASIX) 20 MG tablet Take 1 tablet (20 mg total) by mouth daily as needed for fluid. Patient taking differently: Take 20 mg by mouth daily.  11/22/18  Yes Jettie Booze, MD  ibuprofen (ADVIL) 200 MG tablet Take 200 mg by mouth daily as needed for headache.   Yes [provider]  isosorbide mononitrate (IMDUR) 30 MG 24 hr tablet Take 1 tablet (30 mg total) by mouth daily. 11/22/18  Yes Jettie Booze, MD  latanoprost (XALATAN) 0.005 % ophthalmic  solution Place 1 drop into both eyes at bedtime.   Yes [provider]  lisinopril (ZESTRIL) 10 MG tablet Take 1 tablet (10 mg total) by mouth daily. 11/22/18 11/22/19 Yes Jettie Booze, MD  metoprolol tartrate (LOPRESSOR) 25 MG tablet Take 3 tablets (75 mg total) by mouth 2 (two) times daily. Patient taking differently: Take 50 mg by mouth 2 (two) times daily.  02/23/19  Yes Camnitz, Ocie Doyne, MD  nitroGLYCERIN (NITROSTAT) 0.4 MG SL tablet Place 1 tablet (0.4 mg total) under the tongue every 5 (five) minutes x 3 doses as needed for chest pain. 11/22/18  Yes Jettie Booze, MD  OVER THE COUNTER MEDICATION Take 2-3 capsules by mouth See admin instructions. Omega XL - take 3 capsules by mouth every morning and 2 capsules at night   Yes [provider]    Physical Exam: Vitals:   08/06/19 2030 08/06/19 2057 08/06/19 2100 08/06/19 2130  BP: (!) 145/113 (!) 148/110 (!) 154/106 (!) 181/122  Pulse: 78   87  Resp: 22 20 17  (!) 25  Temp:      TempSrc:      SpO2: 99%  100%  94%  Weight:      Height:        Constitutional: NAD, calm, comfortable, thin cachectic appearing elderly male laying flat in bed Vitals:   08/06/19 2030 08/06/19 2057 08/06/19 2100 08/06/19 2130  BP: (!) 145/113 (!) 148/110 (!) 154/106 (!) 181/122  Pulse: 78   87  Resp: 22 20 17  (!) 25  Temp:      TempSrc:      SpO2: 99% 100%  94%  Weight:      Height:       Eyes: PERRL, lids and conjunctivae normal ENMT: Mucous membranes are moist.  Neck: normal, supple,  Respiratory: Bibasilar crackles but no wheezing.. Normal respiratory effort. No accessory muscle use.  Cardiovascular: Irregularly irregular rate and rhythm, no murmurs / rubs / gallops.  +3 pitting edema up to mid pretibial region of bilateral lower extremity. Abdomen: no tenderness, no masses palpated.  Bowel sounds positive.  Musculoskeletal: no clubbing / cyanosis. No joint deformity upper and lower extremities. Good ROM, no contractures. Normal muscle tone.  Skin: Ecchymosis of the right anterior chest around known fourth rib fracture.  Healed scab eschar on left knee.   Neurologic: CN 2-12 grossly intact. Sensation intact, Strength 5/5 in all 4.  Psychiatric: Normal judgment and insight. Alert and oriented x 3. Normal mood.     Labs on Admission: I have personally reviewed following labs and imaging studies  CBC: Recent Labs  Lab 08/06/19 1642  WBC 6.0  HGB 12.9*  HCT 39.2  MCV 91.6  PLT 119   Basic Metabolic Panel: Recent Labs  Lab 08/06/19 1642  NA 142  K 3.4*  CL 106  CO2 25  GLUCOSE 123*  BUN 25*  CREATININE 1.43*  CALCIUM 8.7*   GFR: Estimated Creatinine Clearance: 36.5 mL/min (A) (by C-G formula based on SCr of 1.43 mg/dL (H)). Liver Function Tests: Recent Labs  Lab 08/06/19 1642  AST 53*  ALT 65*  ALKPHOS 89  BILITOT 1.5*  PROT 6.8  ALBUMIN 3.4*   No results for input(s): LIPASE, AMYLASE in the last 168 hours. No results for input(s): AMMONIA in the last 168  hours. Coagulation Profile: No results for input(s): INR, PROTIME in the last 168 hours. Cardiac Enzymes: No results for input(s): CKTOTAL, CKMB, CKMBINDEX, TROPONINI in the last 168  hours. BNP (last 3 results) No results for input(s): PROBNP in the last 8760 hours. HbA1C: No results for input(s): HGBA1C in the last 72 hours. CBG: No results for input(s): GLUCAP in the last 168 hours. Lipid Profile: No results for input(s): CHOL, HDL, LDLCALC, TRIG, CHOLHDL, LDLDIRECT in the last 72 hours. Thyroid Function Tests: No results for input(s): TSH, T4TOTAL, FREET4, T3FREE, THYROIDAB in the last 72 hours. Anemia Panel: No results for input(s): VITAMINB12, FOLATE, FERRITIN, TIBC, IRON, RETICCTPCT in the last 72 hours. Urine analysis:    Component Value Date/Time   COLORURINE YELLOW 08/06/2019 1642   APPEARANCEUR CLEAR 08/06/2019 1642   LABSPEC 1.011 08/06/2019 1642   PHURINE 6.0 08/06/2019 1642   GLUCOSEU NEGATIVE 08/06/2019 1642   HGBUR SMALL (A) 08/06/2019 1642   BILIRUBINUR NEGATIVE 08/06/2019 1642   KETONESUR NEGATIVE 08/06/2019 1642   PROTEINUR NEGATIVE 08/06/2019 1642   UROBILINOGEN 0.2 01/27/2011 1233   NITRITE NEGATIVE 08/06/2019 1642   LEUKOCYTESUR NEGATIVE 08/06/2019 1642    Radiological Exams on Admission: CT Angio Chest PE W/Cm &/Or Wo Cm  Result Date: 08/06/2019 CLINICAL DATA:  Fatigue for 2- 3 weeks recent fall EXAM: CT ANGIOGRAPHY CHEST WITH CONTRAST TECHNIQUE: Multidetector CT imaging of the chest was performed using the standard protocol during bolus administration of intravenous contrast. Multiplanar CT image reconstructions and MIPs were obtained to evaluate the vascular anatomy. CONTRAST:  129mL OMNIPAQUE IOHEXOL 350 MG/ML SOLN COMPARISON:  None. FINDINGS: Cardiovascular: There is a optimal opacification of the pulmonary arteries. There is equivocal partial non opacification of a small subsegmental posterior right lower lobe pulmonary artery best seen on series 3,  image 175. No segmental or central pulmonary embolism is seen. No left-sided filling defects are noted. There is mild cardiomegaly. Coronary artery calcifications are seen. No pericardial effusion or thickening. No evidence right heart strain. There is normal three-vessel brachiocephalic anatomy without proximal stenosis. Scattered aortic atherosclerosis is noted. Mediastinum/Nodes: No hilar, mediastinal, or axillary adenopathy. Thyroid gland, trachea, and esophagus demonstrate no significant findings. Lungs/Pleura: Small bilateral pleural effusions are present, right greater than left with hazy adjacent atelectasis. Musculoskeletal: There is a nondisplaced lateral right fourth rib fracture present. Review of the MIP images confirms the above findings. Abdomen/pelvis: Hepatobiliary: Multiple low-density lesions are seen throughout the liver parenchyma the largest within the right liver lobe measuring 3.6 cm. Main portal vein is patent. Partially calcified gallstones are present. No intrahepatic biliary ductal dilatation. Evidence of calcified gallstones, gallbladder wall thickening or biliary dilatation. Pancreas: Unremarkable. No pancreatic ductal dilatation or surrounding inflammatory changes. Spleen: Normal in size without focal abnormality. Adrenals/Urinary Tract: Both adrenal glands appear normal. Bilateral low-density lesions are seen within both kidneys the largest partially exophytic off the upper pole of the left kidney measuring 7.8 cm. No renal or collecting system calculi. No hydronephrosis. The bladder is grossly unremarkable. Stomach/Bowel: The stomach, small bowel, and colon are normal in appearance. No inflammatory changes, wall thickening, or obstructive findings.Scattered colonic diverticula are noted. Vascular/Lymphatic: There are no enlarged mesenteric, retroperitoneal, or pelvic lymph nodes. Scattered aortic atherosclerotic calcifications are seen without aneurysmal dilatation. Reproductive:  Suboptimally visualized due to overlying streak artifact. Other: No evidence of abdominal wall mass or hernia. Musculoskeletal: No acute or significant osseous findings. Bilateral total hip arthroplasties are present. Degenerative changes are seen in the thoracolumbar spine. IMPRESSION: 1. Equivocal tiny filling defect in the posterior right lower lobe subsegmental pulmonary artery which could represent a small pulmonary embolism versus non opacification. No large or central pulmonary embolism. 2.  Small bilateral pleural effusions and dependent atelectasis 3. No acute intra-abdominal or pelvic pathology to explain the patient's symptoms 4. Nondisplaced lateral right fourth rib fracture 5.  Aortic Atherosclerosis (ICD10-I70.0). Electronically Signed   By: Prudencio Pair M.D.   On: 08/06/2019 21:21   CT Abdomen Pelvis W Contrast  Result Date: 08/06/2019 CLINICAL DATA:  Fatigue for 2- 3 weeks recent fall EXAM: CT ANGIOGRAPHY CHEST WITH CONTRAST TECHNIQUE: Multidetector CT imaging of the chest was performed using the standard protocol during bolus administration of intravenous contrast. Multiplanar CT image reconstructions and MIPs were obtained to evaluate the vascular anatomy. CONTRAST:  135mL OMNIPAQUE IOHEXOL 350 MG/ML SOLN COMPARISON:  None. FINDINGS: Cardiovascular: There is a optimal opacification of the pulmonary arteries. There is equivocal partial non opacification of a small subsegmental posterior right lower lobe pulmonary artery best seen on series 3, image 175. No segmental or central pulmonary embolism is seen. No left-sided filling defects are noted. There is mild cardiomegaly. Coronary artery calcifications are seen. No pericardial effusion or thickening. No evidence right heart strain. There is normal three-vessel brachiocephalic anatomy without proximal stenosis. Scattered aortic atherosclerosis is noted. Mediastinum/Nodes: No hilar, mediastinal, or axillary adenopathy. Thyroid gland, trachea, and  esophagus demonstrate no significant findings. Lungs/Pleura: Small bilateral pleural effusions are present, right greater than left with hazy adjacent atelectasis. Musculoskeletal: There is a nondisplaced lateral right fourth rib fracture present. Review of the MIP images confirms the above findings. Abdomen/pelvis: Hepatobiliary: Multiple low-density lesions are seen throughout the liver parenchyma the largest within the right liver lobe measuring 3.6 cm. Main portal vein is patent. Partially calcified gallstones are present. No intrahepatic biliary ductal dilatation. Evidence of calcified gallstones, gallbladder wall thickening or biliary dilatation. Pancreas: Unremarkable. No pancreatic ductal dilatation or surrounding inflammatory changes. Spleen: Normal in size without focal abnormality. Adrenals/Urinary Tract: Both adrenal glands appear normal. Bilateral low-density lesions are seen within both kidneys the largest partially exophytic off the upper pole of the left kidney measuring 7.8 cm. No renal or collecting system calculi. No hydronephrosis. The bladder is grossly unremarkable. Stomach/Bowel: The stomach, small bowel, and colon are normal in appearance. No inflammatory changes, wall thickening, or obstructive findings.Scattered colonic diverticula are noted. Vascular/Lymphatic: There are no enlarged mesenteric, retroperitoneal, or pelvic lymph nodes. Scattered aortic atherosclerotic calcifications are seen without aneurysmal dilatation. Reproductive: Suboptimally visualized due to overlying streak artifact. Other: No evidence of abdominal wall mass or hernia. Musculoskeletal: No acute or significant osseous findings. Bilateral total hip arthroplasties are present. Degenerative changes are seen in the thoracolumbar spine. IMPRESSION: 1. Equivocal tiny filling defect in the posterior right lower lobe subsegmental pulmonary artery which could represent a small pulmonary embolism versus non opacification. No  large or central pulmonary embolism. 2. Small bilateral pleural effusions and dependent atelectasis 3. No acute intra-abdominal or pelvic pathology to explain the patient's symptoms 4. Nondisplaced lateral right fourth rib fracture 5.  Aortic Atherosclerosis (ICD10-I70.0). Electronically Signed   By: Prudencio Pair M.D.   On: 08/06/2019 21:21   DG Chest Port 1 View  Result Date: 08/06/2019 CLINICAL DATA:  Fatigue EXAM: PORTABLE CHEST 1 VIEW COMPARISON:  April 13, 2019 FINDINGS: There is unchanged cardiomegaly. Aortic knob calcifications are seen. Trace right pleural effusion is seen. There is mild prominence of the central pulmonary vasculature. Overlying loop recorder is seen. No acute osseous abnormality. IMPRESSION: Mild pulmonary vascular congestion and trace right pleural effusion. Electronically Signed   By: Prudencio Pair M.D.   On: 08/06/2019 20:00  Assessment/Plan  Acute on chronic CHF Elevated BNP, mildly elevated flat troponins and LE edema Last echo of 45-45% in 10/2018. Will repeat.  strict intake and output Daily weights Daily IV Lasix  Equivocal filling defect of the posterior right lower lobe on CTA chest Low suspicion for PE at this time.  Patient endorsed compliant with Eliquis and is not currently hypoxic or have any chest pain  Elevated troponin Flat at 35.  Suspect demand ischemia from cardiomyopathy.  AKI creatinine of 1.43 up from 1.15 monitor closely with diuresis. Avoid other nephrotoxic agents  Transaminitis Patient has history of reactive hepatitis C in the past.  However suspect increased LFTs at this time is due to vascular congestion.  Nondisplaced lateral right fourth rib fracture s/p mechanical fall Conservative management with pain meds PRN  Paroxysmal atrial fibrillation Diagnosed on loop recorder s/p cryptogenic stroke last year Continue Eliquis  History of CVA continue Plavix   Hypertension Continue metoprolol, hold lisinopril for now due to  AKI  Hyperlipidemia Continue statin  CAD Continue Imdur.  No anginal symptoms.  DVT prophylaxis:.Eliquis Code Status: Full Family Communication: Plan discussed with patient at bedside  disposition Plan: Home with observation Consults called:  Admission status: Observation  Status is: Observation  The patient remains OBS appropriate and will d/c before 2 midnights.  Dispo: The patient is from: Home              Anticipated d/c is to: Home              Anticipated d/c date is: 1 day              Patient currently is not medically stable to d/c.         Orene Desanctis DO [facility]   If 7PM-7AM, please contact night-coverage [email or web address]   08/06/2019, 10:26 PM

## 2019-08-07 ENCOUNTER — Observation Stay (HOSPITAL_COMMUNITY): Payer: Medicare HMO

## 2019-08-07 ENCOUNTER — Encounter (HOSPITAL_COMMUNITY): Payer: Self-pay | Admitting: Family Medicine

## 2019-08-07 DIAGNOSIS — Z87891 Personal history of nicotine dependence: Secondary | ICD-10-CM | POA: Diagnosis not present

## 2019-08-07 DIAGNOSIS — I5021 Acute systolic (congestive) heart failure: Secondary | ICD-10-CM

## 2019-08-07 DIAGNOSIS — I428 Other cardiomyopathies: Secondary | ICD-10-CM | POA: Diagnosis present

## 2019-08-07 DIAGNOSIS — Z6825 Body mass index (BMI) 25.0-25.9, adult: Secondary | ICD-10-CM | POA: Diagnosis not present

## 2019-08-07 DIAGNOSIS — I429 Cardiomyopathy, unspecified: Secondary | ICD-10-CM | POA: Diagnosis present

## 2019-08-07 DIAGNOSIS — E43 Unspecified severe protein-calorie malnutrition: Secondary | ICD-10-CM | POA: Diagnosis present

## 2019-08-07 DIAGNOSIS — I5031 Acute diastolic (congestive) heart failure: Secondary | ICD-10-CM

## 2019-08-07 DIAGNOSIS — Z8249 Family history of ischemic heart disease and other diseases of the circulatory system: Secondary | ICD-10-CM | POA: Diagnosis not present

## 2019-08-07 DIAGNOSIS — B1920 Unspecified viral hepatitis C without hepatic coma: Secondary | ICD-10-CM | POA: Diagnosis present

## 2019-08-07 DIAGNOSIS — I619 Nontraumatic intracerebral hemorrhage, unspecified: Secondary | ICD-10-CM | POA: Diagnosis present

## 2019-08-07 DIAGNOSIS — I5043 Acute on chronic combined systolic (congestive) and diastolic (congestive) heart failure: Secondary | ICD-10-CM | POA: Diagnosis present

## 2019-08-07 DIAGNOSIS — I618 Other nontraumatic intracerebral hemorrhage: Secondary | ICD-10-CM | POA: Diagnosis present

## 2019-08-07 DIAGNOSIS — I610 Nontraumatic intracerebral hemorrhage in hemisphere, subcortical: Secondary | ICD-10-CM | POA: Diagnosis not present

## 2019-08-07 DIAGNOSIS — R778 Other specified abnormalities of plasma proteins: Secondary | ICD-10-CM | POA: Diagnosis not present

## 2019-08-07 DIAGNOSIS — G9341 Metabolic encephalopathy: Secondary | ICD-10-CM | POA: Diagnosis present

## 2019-08-07 DIAGNOSIS — I10 Essential (primary) hypertension: Secondary | ICD-10-CM | POA: Diagnosis not present

## 2019-08-07 DIAGNOSIS — W1830XA Fall on same level, unspecified, initial encounter: Secondary | ICD-10-CM | POA: Diagnosis present

## 2019-08-07 DIAGNOSIS — I480 Paroxysmal atrial fibrillation: Secondary | ICD-10-CM | POA: Diagnosis present

## 2019-08-07 DIAGNOSIS — Z8546 Personal history of malignant neoplasm of prostate: Secondary | ICD-10-CM | POA: Diagnosis not present

## 2019-08-07 DIAGNOSIS — I2510 Atherosclerotic heart disease of native coronary artery without angina pectoris: Secondary | ICD-10-CM | POA: Diagnosis present

## 2019-08-07 DIAGNOSIS — I130 Hypertensive heart and chronic kidney disease with heart failure and stage 1 through stage 4 chronic kidney disease, or unspecified chronic kidney disease: Secondary | ICD-10-CM | POA: Diagnosis present

## 2019-08-07 DIAGNOSIS — I5041 Acute combined systolic (congestive) and diastolic (congestive) heart failure: Secondary | ICD-10-CM | POA: Diagnosis not present

## 2019-08-07 DIAGNOSIS — S2231XA Fracture of one rib, right side, initial encounter for closed fracture: Secondary | ICD-10-CM | POA: Diagnosis present

## 2019-08-07 DIAGNOSIS — I252 Old myocardial infarction: Secondary | ICD-10-CM | POA: Diagnosis not present

## 2019-08-07 DIAGNOSIS — Z96641 Presence of right artificial hip joint: Secondary | ICD-10-CM | POA: Diagnosis present

## 2019-08-07 DIAGNOSIS — Z8673 Personal history of transient ischemic attack (TIA), and cerebral infarction without residual deficits: Secondary | ICD-10-CM | POA: Diagnosis not present

## 2019-08-07 DIAGNOSIS — N182 Chronic kidney disease, stage 2 (mild): Secondary | ICD-10-CM | POA: Diagnosis present

## 2019-08-07 DIAGNOSIS — Z7901 Long term (current) use of anticoagulants: Secondary | ICD-10-CM | POA: Diagnosis not present

## 2019-08-07 DIAGNOSIS — N179 Acute kidney failure, unspecified: Secondary | ICD-10-CM | POA: Diagnosis present

## 2019-08-07 DIAGNOSIS — Y929 Unspecified place or not applicable: Secondary | ICD-10-CM | POA: Diagnosis not present

## 2019-08-07 DIAGNOSIS — Z20822 Contact with and (suspected) exposure to covid-19: Secondary | ICD-10-CM | POA: Diagnosis present

## 2019-08-07 DIAGNOSIS — I69351 Hemiplegia and hemiparesis following cerebral infarction affecting right dominant side: Secondary | ICD-10-CM | POA: Diagnosis not present

## 2019-08-07 DIAGNOSIS — D6832 Hemorrhagic disorder due to extrinsic circulating anticoagulants: Secondary | ICD-10-CM | POA: Diagnosis present

## 2019-08-07 DIAGNOSIS — G4733 Obstructive sleep apnea (adult) (pediatric): Secondary | ICD-10-CM | POA: Diagnosis present

## 2019-08-07 LAB — BASIC METABOLIC PANEL
Anion gap: 14 (ref 5–15)
BUN: 20 mg/dL (ref 8–23)
CO2: 27 mmol/L (ref 22–32)
Calcium: 9.5 mg/dL (ref 8.9–10.3)
Chloride: 100 mmol/L (ref 98–111)
Creatinine, Ser: 1.25 mg/dL — ABNORMAL HIGH (ref 0.61–1.24)
GFR calc Af Amer: >60 mL/min (ref >60)
GFR calc non Af Amer: 52 mL/min — ABNORMAL LOW (ref >60)
Glucose, Bld: 109 mg/dL — ABNORMAL HIGH (ref 70–99)
Potassium: 3.6 mmol/L (ref 3.5–5.1)
Sodium: 141 mmol/L (ref 135–145)

## 2019-08-07 LAB — ECHOCARDIOGRAM COMPLETE
Height: 68 in
S' Lateral: 4.1 cm
Weight: 2640 oz

## 2019-08-07 MED ORDER — HYDRALAZINE HCL 20 MG/ML IJ SOLN
10.0000 mg | INTRAMUSCULAR | Status: DC | PRN
  Administered 2019-08-08: 10 mg via INTRAVENOUS
  Filled 2019-08-07: qty 1

## 2019-08-07 MED ORDER — HYDRALAZINE HCL 20 MG/ML IJ SOLN: 10.0000 mg | Freq: Four times a day (QID) | INTRAMUSCULAR | Status: DC | PRN

## 2019-08-07 MED ORDER — LABETALOL HCL 5 MG/ML IV SOLN
10.0000 mg | Freq: Once | INTRAVENOUS | Status: AC
  Administered 2019-08-07: 10 mg via INTRAVENOUS
  Filled 2019-08-07: qty 4

## 2019-08-07 MED ORDER — NICARDIPINE HCL IN NACL 20-0.86 MG/200ML-% IV SOLN: 3.0000 mg/h | INTRAVENOUS | Status: DC

## 2019-08-07 MED ORDER — POLYETHYLENE GLYCOL 3350 17 G PO PACK: 17.0000 g | PACK | Freq: Every day | ORAL | Status: DC | PRN

## 2019-08-07 MED ORDER — LABETALOL HCL 5 MG/ML IV SOLN
10.0000 mg | INTRAVENOUS | Status: DC | PRN
  Administered 2019-08-07 – 2019-08-08 (×3): 10 mg via INTRAVENOUS
  Filled 2019-08-07 (×3): qty 4

## 2019-08-07 MED ORDER — FAMOTIDINE IN NACL 20-0.9 MG/50ML-% IV SOLN
20.0000 mg | Freq: Every day | INTRAVENOUS | Status: DC
  Administered 2019-08-08: 20 mg via INTRAVENOUS
  Filled 2019-08-07: qty 50

## 2019-08-07 MED ORDER — DOCUSATE SODIUM 100 MG PO CAPS: 100.0000 mg | ORAL_CAPSULE | Freq: Two times a day (BID) | ORAL | Status: DC | PRN

## 2019-08-07 NOTE — H&P (Signed)
NAME:  JAKEEM GRAPE, MRN:  [id number], DOB:  09-06-1932, LOS: 0 ADMISSION DATE:  08/06/2019, CONSULTATION DATE:  08/08/2019 REFERRING MD:  [facility] ED, CHIEF COMPLAINT:  Confusion    Brief History   86 yo male with PMH significant for stroke with residual of right sided weakness, CAD, pAFib on Eliquis, HTN, Cardiomyopathy with LVEF: 45-50%, and HCV admitted with left thalamic ICH and acute systolic heart failure.   History of present illness   Patient presented to [facility] ED 8/1 with complaints of fatigue x 2-3 weeks associated with dyspnea, BLE edema and abdominal pain. No complaints of orthopnea, chest pain or fevers. VS on presentation: Temp 97.9, HR: 80, SpO2 96% on room air, BP: 136/110.  COVID negative. CXR showed mild pulmonary congestion and right pleural effusion.  CTA Chest obtained with tiny filling defect in posterior right lower lobe and non displaced lateral right 4th rib fx. He was started on diuresis for acute on chronic CHF.  This morning he was found to be confused, a CTH was completed and showed a 10 mm left thalamic ICH without mass effect. Neurology was consulted and recommended transfer to [facility] for blood pressure control and close neurological monitoring.  No reversal agents for Eliquis or Plavix per Neuro, repeat CTH at 2300.  Past Medical History  Arthritis CAD Cardiomyopathy CKD, stage II HCV HTN pAfib OSA -no CPAP Stroke -Residual right sided weakness   Significant Hospital Events   08/07/19: Admit   Consults:  08/07/19: Neurology 08/07/19: PCCM  Procedures:    Significant Diagnostic Tests:  08/07/19: CTA Chest: equivocal tiny filling defect in the posterior right lower lobe subsegmental pulmonary artery. Small bilateral pleural effusions.  Non displaced right 4th rib fx.  08/07/19: CT Abdomen/Pelvis: Multiple low density lesions throughout the liver parenchyma, Largest within the right liver lobe measures 3.6 cm, bilateral low density kidney lesions, largest off the  upper pole of left kdney measuring 7.8 cm. 08/07/19: CT Head: 10 mm left thalamic ICH without mass effect/edema. chronic infarct of the right occipital lobe 08/07/19: ECHO: LVEF 20% with regional wall motion abnormalities.  LA moderately dilated, RA moderately dilated, mild MVR.   Micro Data:  08/06/19: COVID negative  Antimicrobials:    Interim history/subjective:  New admit, see above  Objective   Blood pressure (!) 147/99, pulse 90, temperature 97.9 F (36.6 C), temperature source Oral, resp. rate 20, height 5\' 8"  (1.727 m), weight 76.2 kg, SpO2 99 %.        Intake/Output Summary (Last 24 hours) at 08/07/2019 2245 Last data filed at 08/07/2019 0453 Gross per 24 hour  Intake --  Output 1375 ml  Net -1375 ml   Filed Weights   08/06/19 1636 08/07/19 1351  Weight: 74.8 kg 76.2 kg    Examination: General: eldlery male, resting in bed, alert to voice and name only  HENT: NCAT, sclera clear, JVD present  Lungs: diminished in the bases, no sig crackles  Cardiovascular: RRR, s1 s2 Abdomen: soft, nt nd  Extremities: BL LE edema  Neuro: alert oriented to person, moving all 4 ext  GU: deferred   Resolved Hospital Problem list     Assessment & Plan:   Acute systolic heart failure --ECHO with reduced LVEF: 21% with global hypokinesis and regional wall motion abnormalities. LVEF noted to be 45-50% in 2020 --EKG without acute ST elevation, high sensitivity troponin 35 --Cardiology consulted by primary team --Continue diuresis, lasix 40mg  q6 X 3 doses - give potassium with  diuresis and mag - recheck lytes in AM - recheck BNP to look at trend   Acute respiratory insufficiency secondary to above Bilateral pleural effusions --Continue diuresis  - wean fio2 as tolerated from nasal cannula   Left thalamic ICH, on eliquis, holding  --Etiology: Likely HTN given location,  --Neurology following Plan: --Goal SBP < 140 and DBP < 90 --Repeat imaging this evening per Neuro --Serial  neurological exams.  --Consider reversal agents if hemorrhage expansion or neurological decline - appreciate neurology and neurosx input - no surgical intervention at this time  - he needs ICU admission for close neuro observation and neuro checks this is best served in the neuroICU   HTN --Goal SBP < 140 and DBP < 90 in setting of ICH Plan: --PRN labetalol and hydralazine available.   - hold cardene at this time  - I suspect the lasix will help drop his bp along with diuresis   Right 4th rib fx --Pain control  pAfib CAD Plan: --Hold home Eliquis in setting of ICH --Continue home metoprolol for rate control  --Goal K > 4 and Mg > 2  History of stroke --baseline: residual right sided weakness Plan: --Hold home Plavix in setting of [place]  HLD --Continue home statin  Acute kidney injury in setting of CKD, stage II Bilateral low density kidney lesions --Kidney ultrasound 2014: right and left kidney with multiple renal cysts --Baseline creatinine: 1.1-1.4 Plan: --Improving with Diuresis, continue  Follow UOP  Liver lesions --Multiple low density lesions noted on CT Abdomen with largest within the right liver lobe measures 3.6 cm  Elevated LFTs --suspect multifactorial with liver lesions, known history of Hepatitis C Virus, and hepatic congestion in setting of heart failure Plan: --Continue to trend  Best practice:  Diet: NPO Pain/Anxiety/Delirium protocol (if indicated): Consider lidocaine patch for rib pain VAP protocol (if indicated): n/a DVT prophylaxis: hold chemo-ppx in setting of acute ICH, SCDs GI prophylaxis: Pepcid Glucose control: daily BG monitoring Mobility: Ambulate with assistance Code Status: FULL Family Communication: NOK: Daughter Disposition: ICU  Labs   CBC: Recent Labs  Lab 08/06/19 1642  WBC 6.0  HGB 12.9*  HCT 39.2  MCV 91.6  PLT 962    Basic Metabolic Panel: Recent Labs  Lab 08/06/19 1642 08/07/19 0449  NA 142 141  K 3.4*  3.6  CL 106 100  CO2 25 27  GLUCOSE 123* 109*  BUN 25* 20  CREATININE 1.43* 1.25*  CALCIUM 8.7* 9.5   GFR: Estimated Creatinine Clearance: 41.8 mL/min (A) (by C-G formula based on SCr of 1.25 mg/dL (H)). Recent Labs  Lab 08/06/19 1642  WBC 6.0    Liver Function Tests: Recent Labs  Lab 08/06/19 1642  AST 53*  ALT 65*  ALKPHOS 89  BILITOT 1.5*  PROT 6.8  ALBUMIN 3.4*   No results for input(s): LIPASE, AMYLASE in the last 168 hours. No results for input(s): AMMONIA in the last 168 hours.  ABG    Component Value Date/Time   TCO2 26 03/06/2014 0919     Coagulation Profile: No results for input(s): INR, PROTIME in the last 168 hours.  Cardiac Enzymes: No results for input(s): CKTOTAL, CKMB, CKMBINDEX, TROPONINI in the last 168 hours.  HbA1C: Hgb A1c MFr Bld  Date/Time Value Ref Range Status  02/14/2018 03:03 AM 5.8 (H) 4.8 - 5.6 % Final    Comment:    (NOTE) Pre diabetes:          5.7%-6.4% Diabetes:              >  6.4% Glycemic control for   <7.0% adults with diabetes     CBG: No results for input(s): GLUCAP in the last 168 hours.  Review of Systems:   Unable to fully obtain. Patient has dementia and is confused.   Past Medical History  He,  has a past medical history of Arthritis, Bleeding stomach ulcer (25 yrs ago), CAD (coronary artery disease), Cancer ([place]) (2002), Cardiomyopathy ([place]), CKD (chronic kidney disease), stage II, Hepatitis (1981), Hypertension, Mild dilation of ascending aorta ([place]), PAF (paroxysmal atrial fibrillation) ([place]), Sleep apnea (2005), Stroke ([place]) (2003), Wears dentures, and Wears glasses.   Surgical History    Past Surgical History:  Procedure Laterality Date  . BUBBLE STUDY  09/16/2018   Procedure: BUBBLE STUDY;  Surgeon: Lelon Perla, MD;  Location: [facility];  Service: Cardiovascular;;  . CARDIAC CATHETERIZATION  10/19/2018  . CARPAL TUNNEL RELEASE Left 03/29/2013   Procedure: LEFT CARPAL TUNNEL RELEASE;   Surgeon: Wynonia Sours, MD;  Location: [facility];  Service: Orthopedics;  Laterality: Left;  ANESTHESIA: IV REGIONAL FAB  . CARPAL TUNNEL RELEASE Right 03/06/2014   Procedure: RIGHT CARPAL TUNNEL RELEASE;  Surgeon: Daryll Brod, MD;  Location: [place];  Service: Orthopedics;  Laterality: Right;  . COLONOSCOPY    . HERNIA REPAIR  2009   rt inguinal hernia  . JOINT REPLACEMENT  12/2001   rt hip ;[place] CATH AND CORONARY ANGIOGRAPHY N/A 10/19/2018   Procedure: LEFT HEART CATH AND CORONARY ANGIOGRAPHY;  Surgeon: Wellington Hampshire, MD;  Location: [facility];  Service: Cardiovascular;  Laterality: N/A;  . LOOP RECORDER INSERTION N/A 09/16/2018   Procedure: LOOP RECORDER INSERTION;  Surgeon: Constance Haw, MD;  Location: [place] [facility];  Service: Cardiovascular;  Laterality: N/A;  . PROSTATECTOMY  2002  . SHOULDER ARTHROSCOPY W/ ROTATOR CUFF REPAIR  2014   lt-dr whitfield  . TEE WITHOUT CARDIOVERSION N/A 09/16/2018   Procedure: TRANSESOPHAGEAL ECHOCARDIOGRAM (TEE);  Surgeon: Lelon Perla, MD;  Location: [facility] ENDOSCOPY;  Service: Cardiovascular;  Laterality: N/A;  . TOTAL HIP ARTHROPLASTY  02/03/2011   Procedure: TOTAL HIP ARTHROPLASTY;  Surgeon: Garald Balding, MD;  Location: [place];  Service: Orthopedics;  Laterality: Left;  . TRIGGER FINGER RELEASE Left 03/29/2013   Procedure: LEFT MIDDLE/SMALL FINGER RELEASE A-1 PULLEY AND LEFT RING FINGER;  Surgeon: Wynonia Sours, MD;  Location: [place];  Service: Orthopedics;  Laterality: Left;  . TRIGGER FINGER RELEASE Right 03/06/2014   Procedure: RIGHT MIDDLE FINGER,RIGHT RINGER FINGER;  Surgeon: Daryll Brod, MD;  Location: [place];  Service: Orthopedics;  Laterality: Right;     Social History   reports that he quit smoking about 11 years ago. His smoking use included cigarettes. He has a 30.00 pack-year smoking history. He has never used smokeless  tobacco. He reports that he does not drink alcohol and does not use drugs.   Family History   His family history includes Heart disease in his father and mother.   Allergies No Known Allergies   Home Medications  Prior to Admission medications   Medication Sig Start Date End Date Taking? Authorizing Provider  apixaban (ELIQUIS) 5 MG TABS tablet Take 1 tablet (5 mg total) by mouth 2 (two) times daily. 11/22/18  Yes Jettie Booze, MD  atorvastatin (LIPITOR) 40 MG tablet TAKE 1 TABLET EVERY DAY Patient taking differently: Take 40 mg by mouth at bedtime.  07/24/19  Yes Jettie Booze, MD  clopidogrel (PLAVIX) 75 MG tablet Take 1 tablet (75 mg total) by mouth daily with breakfast. 11/22/18  Yes Jettie Booze, MD  furosemide (LASIX) 20 MG tablet Take 1 tablet (20 mg total) by mouth daily as needed for fluid. Patient taking differently: Take 20 mg by mouth daily.  11/22/18  Yes Jettie Booze, MD  ibuprofen (ADVIL) 200 MG tablet Take 200 mg by mouth daily as needed for headache.   Yes [provider]  isosorbide mononitrate (IMDUR) 30 MG 24 hr tablet Take 1 tablet (30 mg total) by mouth daily. 11/22/18  Yes Jettie Booze, MD  latanoprost (XALATAN) 0.005 % ophthalmic solution Place 1 drop into both eyes at bedtime.   Yes [provider]  lisinopril (ZESTRIL) 10 MG tablet Take 1 tablet (10 mg total) by mouth daily. 11/22/18 11/22/19 Yes Jettie Booze, MD  metoprolol tartrate (LOPRESSOR) 25 MG tablet Take 3 tablets (75 mg total) by mouth 2 (two) times daily. Patient taking differently: Take 50 mg by mouth 2 (two) times daily.  02/23/19  Yes Camnitz, Ocie Doyne, MD  nitroGLYCERIN (NITROSTAT) 0.4 MG SL tablet Place 1 tablet (0.4 mg total) under the tongue every 5 (five) minutes x 3 doses as needed for chest pain. 11/22/18  Yes Jettie Booze, MD  OVER THE COUNTER MEDICATION Take 2-3 capsules by mouth See admin instructions. Omega XL - take 3  capsules by mouth every morning and 2 capsules at night   Yes [provider]      This patient is critically ill with multiple organ system failure; which, requires frequent high complexity decision making, assessment, support, evaluation, and titration of therapies. This was completed through the application of advanced monitoring technologies and extensive interpretation of multiple databases. During this encounter critical care time was devoted to patient care services described in this note for 33 minutes.  Garner Nash, DO [facility] 08/08/2019 1:52 AM

## 2019-08-07 NOTE — Progress Notes (Signed)
Echocardiogram 2D Echocardiogram has been performed.  [name] 08/07/2019, 9:33 AM

## 2019-08-07 NOTE — Progress Notes (Addendum)
PROGRESS NOTE    Brian Brown  MLY:[id number] DOB: 10-03-1932 DOA: 08/06/2019   PCP: Seward Carol, MD.   Brief Narrative: Brian Brown is a 86 y.o. male with medical history significant for CVA, CAD s/p NSTEMI, cardiomyopathy, paroxysmal atrial fibrillation on Eliquis, OSA not on CPAP, hypertension, hepatitis C who presents with complains of worsening fatigue. He reports progressive fatigue for last 1 month. He usually walks his dog about twice a day but for the past 2 weeks he could no longer do that.  He felt short of breath just doing dishes or making his bed.  He also had a fall last week taking trash out and felt like his "hips slipped out from under him."  Denies any lightheadedness or loss of consciousness.  He has also been having on and off lower extremity edema with the right worse than the left ever since he had a stroke last year.  However in the past 2 to 3 days he noticed that both legs have been significantly worse.  He takes Lasix daily. He also is on Eliquis for paroxysmal atrial fibrillation,  denies any missed doses. Otherwise he denies chest pain, orthopnea, nausea vomiting or diarrhea. Pt lives alone but has family that checks on him on a regarding basis.  Troponin mildly elevated at 35 although generally run in the 50s in the past.  BNP of 1249.   Chest x-ray shows mild pulmonary vascular congestion and trace right pleural effusion. CTA shows equivocal tiny filling defect in posterior right lower lobe.  Small bilateral pleural effusion.  Nondisplaced lateral right fourth rib fracture.  Patient was admitted on acute on chronic CHF.  Echocardiogram was ordered which shows EF of 21%. global wall motion hypokinesis cardiology consulted awaiting recommendation.   Assessment & Plan:   Active Problems:   Hypertension   PAF (paroxysmal atrial fibrillation) (HCC)   Acute CHF (congestive heart failure) (HCC)   AKI (acute kidney injury) (HCC)   Elevated troponin    Transaminitis   Closed rib fracture   History of CVA (cerebrovascular accident)   Acute on chronic Systolic CHF sec. to cardiomyopathy Elevated BNP, mildly elevated flat troponins and LE edema Last echo of 45-45% in 10/2018.  Repeat echocardiogram shows EF of 21% global hypokinesis strict intake and output Daily weights Daily IV Lasix. Cardiology consulted,  awaiting recommendation.  Equivocal filling defect of the posterior right lower lobe on CTA chest Low suspicion for PE at this time.   Patient endorsed compliant with Eliquis and is not currently hypoxic or have any chest pain.   Elevated troponin Flat at 35 - 35.  Suspect demand ischemia from cardiomyopathy.   AKI on CKD creatinine of 1.43 up from 1.15. monitor closely with diuresis.  Avoid other nephrotoxic agents  Transaminitis Patient has history of reactive hepatitis C in the past.   However suspect increased LFTs at this time is due to vascular congestion.  Nondisplaced lateral right fourth rib fracture s/p mechanical fall Conservative management with pain meds PRN  Paroxysmal atrial fibrillation. Heart rate,  well controlled Diagnosed on loop recorder,  s/p cryptogenic stroke last year Continue Eliquis for anticoagulation  History of CVA continue Plavix.  Hypertension Continue metoprolol, hold lisinopril for now due to AKI  Hyperlipidemia Continue statin  CAD Continue Imdur.  No anginal symptoms.  DVT prophylaxis:.Eliquis Code Status: Full Family Communication: Plan discussed with patient at bedside  disposition Plan: Home with observation Consults called: Cardiology  Dispo: The patient is from: Home  Anticipated d/c is to: Home  Anticipated d/c date is: 1-2  day  Patient currently is not medically stable to d/c due to ongoing fatigue and work-up     Consultants:   Cardiology.  Procedures:  Echo.  Antimicrobials:  Anti-infectives  (From admission, onward)   None      Subjective: Patient was seen and examined at bedside.  No overnight event.  Patient reports fatigue but denies any chest pain or shortness of breath.  Objective: Vitals:   08/07/19 1354 08/07/19 1400 08/07/19 1500 08/07/19 1600  BP:  (!) 127/98 (!) 137/110 (!) 119/105  Pulse: 100 68 75 67  Resp:  18  17  Temp:      TempSrc:      SpO2: 98% 93% 97% 100%  Weight:      Height:        Intake/Output Summary (Last 24 hours) at 08/07/2019 1633 Last data filed at 08/07/2019 0453 Gross per 24 hour  Intake --  Output 1375 ml  Net -1375 ml   Filed Weights   08/06/19 1636 08/07/19 1351  Weight: 74.8 kg 76.2 kg    Examination:  General exam: Appears calm and comfortable  Respiratory system: Clear to auscultation. Respiratory effort normal. Cardiovascular system: S1 & S2 heard, RRR. No JVD, murmurs, rubs, gallops or clicks. No pedal edema. Gastrointestinal system: Abdomen is nondistended, soft and nontender. No organomegaly or masses felt. Normal bowel sounds heard. Central nervous system: Alert and oriented. No focal neurological deficits. Extremities: Bilateral pitting edema 1++ Skin: No rashes, lesions or ulcers Psychiatry: Judgement and insight appear normal. Mood & affect appropriate.     Data Reviewed: I have personally reviewed following labs and imaging studies  CBC: Recent Labs  Lab 08/06/19 1642  WBC 6.0  HGB 12.9*  HCT 39.2  MCV 91.6  PLT 299   Basic Metabolic Panel: Recent Labs  Lab 08/06/19 1642 08/07/19 0449  NA 142 141  K 3.4* 3.6  CL 106 100  CO2 25 27  GLUCOSE 123* 109*  BUN 25* 20  CREATININE 1.43* 1.25*  CALCIUM 8.7* 9.5   GFR: Estimated Creatinine Clearance: 41.8 mL/min (A) (by C-G formula based on SCr of 1.25 mg/dL (H)). Liver Function Tests: Recent Labs  Lab 08/06/19 1642  AST 53*  ALT 65*  ALKPHOS 89  BILITOT 1.5*  PROT 6.8  ALBUMIN 3.4*   No results for input(s): LIPASE, AMYLASE in the last  168 hours. No results for input(s): AMMONIA in the last 168 hours. Coagulation Profile: No results for input(s): INR, PROTIME in the last 168 hours. Cardiac Enzymes: No results for input(s): CKTOTAL, CKMB, CKMBINDEX, TROPONINI in the last 168 hours. BNP (last 3 results) No results for input(s): PROBNP in the last 8760 hours. HbA1C: No results for input(s): HGBA1C in the last 72 hours. CBG: No results for input(s): GLUCAP in the last 168 hours. Lipid Profile: No results for input(s): CHOL, HDL, LDLCALC, TRIG, CHOLHDL, LDLDIRECT in the last 72 hours. Thyroid Function Tests: No results for input(s): TSH, T4TOTAL, FREET4, T3FREE, THYROIDAB in the last 72 hours. Anemia Panel: No results for input(s): VITAMINB12, FOLATE, FERRITIN, TIBC, IRON, RETICCTPCT in the last 72 hours. Sepsis Labs: No results for input(s): PROCALCITON, LATICACIDVEN in the last 168 hours.  Recent Results (from the past 240 hour(s))  SARS Coronavirus 2 by RT PCR (hospital order, performed in [facility] hospital lab) Nasopharyngeal Urine, Clean Catch     Status: None   Collection Time: 08/06/19  7:50 PM  Specimen: Urine, Clean Catch; Nasopharyngeal  Result Value Ref Range Status   SARS Coronavirus 2 NEGATIVE NEGATIVE Final    Comment: (NOTE) SARS-CoV-2 target nucleic acids are NOT DETECTED.  The SARS-CoV-2 RNA is generally detectable in upper and lower respiratory specimens during the acute phase of infection. The lowest concentration of SARS-CoV-2 viral copies this assay can detect is 250 copies / mL. A negative result does not preclude SARS-CoV-2 infection and should not be used as the sole basis for treatment or other patient management decisions.  A negative result may occur with improper specimen collection / handling, submission of specimen other than nasopharyngeal swab, presence of viral mutation(s) within the areas targeted by this assay, and inadequate number of viral copies (<250 copies / mL). A  negative result must be combined with clinical observations, patient history, and epidemiological information.  Fact Sheet for Patients:   StrictlyIdeas.no  Fact Sheet for Healthcare Providers: BankingDealers.co.za  This test is not yet approved or  cleared by the [place] FDA and has been authorized for detection and/or diagnosis of SARS-CoV-2 by FDA under an Emergency Use Authorization (EUA).  This EUA will remain in effect (meaning this test can be used) for the duration of the COVID-19 declaration under Section 564(b)(1) of the Act, 21 U.S.C. section 360bbb-3(b)(1), unless the authorization is terminated or revoked sooner.  Performed at [facility], [place] [address], [place],  [address]          Radiology Studies: CT Angio Chest PE W/Cm &/Or Wo Cm  Result Date: 08/06/2019 CLINICAL DATA:  Fatigue for 2- 3 weeks recent fall EXAM: CT ANGIOGRAPHY CHEST WITH CONTRAST TECHNIQUE: Multidetector CT imaging of the chest was performed using the standard protocol during bolus administration of intravenous contrast. Multiplanar CT image reconstructions and MIPs were obtained to evaluate the vascular anatomy. CONTRAST:  154mL OMNIPAQUE IOHEXOL 350 MG/ML SOLN COMPARISON:  None. FINDINGS: Cardiovascular: There is a optimal opacification of the pulmonary arteries. There is equivocal partial non opacification of a small subsegmental posterior right lower lobe pulmonary artery best seen on series 3, image 175. No segmental or central pulmonary embolism is seen. No left-sided filling defects are noted. There is mild cardiomegaly. Coronary artery calcifications are seen. No pericardial effusion or thickening. No evidence right heart strain. There is normal three-vessel brachiocephalic anatomy without proximal stenosis. Scattered aortic atherosclerosis is noted. Mediastinum/Nodes: No hilar, mediastinal, or axillary adenopathy.  Thyroid gland, trachea, and esophagus demonstrate no significant findings. Lungs/Pleura: Small bilateral pleural effusions are present, right greater than left with hazy adjacent atelectasis. Musculoskeletal: There is a nondisplaced lateral right fourth rib fracture present. Review of the MIP images confirms the above findings. Abdomen/pelvis: Hepatobiliary: Multiple low-density lesions are seen throughout the liver parenchyma the largest within the right liver lobe measuring 3.6 cm. Main portal vein is patent. Partially calcified gallstones are present. No intrahepatic biliary ductal dilatation. Evidence of calcified gallstones, gallbladder wall thickening or biliary dilatation. Pancreas: Unremarkable. No pancreatic ductal dilatation or surrounding inflammatory changes. Spleen: Normal in size without focal abnormality. Adrenals/Urinary Tract: Both adrenal glands appear normal. Bilateral low-density lesions are seen within both kidneys the largest partially exophytic off the upper pole of the left kidney measuring 7.8 cm. No renal or collecting system calculi. No hydronephrosis. The bladder is grossly unremarkable. Stomach/Bowel: The stomach, small bowel, and colon are normal in appearance. No inflammatory changes, wall thickening, or obstructive findings.Scattered colonic diverticula are noted. Vascular/Lymphatic: There are no enlarged mesenteric, retroperitoneal, or pelvic lymph nodes.  Scattered aortic atherosclerotic calcifications are seen without aneurysmal dilatation. Reproductive: Suboptimally visualized due to overlying streak artifact. Other: No evidence of abdominal wall mass or hernia. Musculoskeletal: No acute or significant osseous findings. Bilateral total hip arthroplasties are present. Degenerative changes are seen in the thoracolumbar spine. IMPRESSION: 1. Equivocal tiny filling defect in the posterior right lower lobe subsegmental pulmonary artery which could represent a small pulmonary embolism  versus non opacification. No large or central pulmonary embolism. 2. Small bilateral pleural effusions and dependent atelectasis 3. No acute intra-abdominal or pelvic pathology to explain the patient's symptoms 4. Nondisplaced lateral right fourth rib fracture 5.  Aortic Atherosclerosis (ICD10-I70.0). Electronically Signed   By: Prudencio Pair M.D.   On: 08/06/2019 21:21   CT Abdomen Pelvis W Contrast  Result Date: 08/06/2019 CLINICAL DATA:  Fatigue for 2- 3 weeks recent fall EXAM: CT ANGIOGRAPHY CHEST WITH CONTRAST TECHNIQUE: Multidetector CT imaging of the chest was performed using the standard protocol during bolus administration of intravenous contrast. Multiplanar CT image reconstructions and MIPs were obtained to evaluate the vascular anatomy. CONTRAST:  133mL OMNIPAQUE IOHEXOL 350 MG/ML SOLN COMPARISON:  None. FINDINGS: Cardiovascular: There is a optimal opacification of the pulmonary arteries. There is equivocal partial non opacification of a small subsegmental posterior right lower lobe pulmonary artery best seen on series 3, image 175. No segmental or central pulmonary embolism is seen. No left-sided filling defects are noted. There is mild cardiomegaly. Coronary artery calcifications are seen. No pericardial effusion or thickening. No evidence right heart strain. There is normal three-vessel brachiocephalic anatomy without proximal stenosis. Scattered aortic atherosclerosis is noted. Mediastinum/Nodes: No hilar, mediastinal, or axillary adenopathy. Thyroid gland, trachea, and esophagus demonstrate no significant findings. Lungs/Pleura: Small bilateral pleural effusions are present, right greater than left with hazy adjacent atelectasis. Musculoskeletal: There is a nondisplaced lateral right fourth rib fracture present. Review of the MIP images confirms the above findings. Abdomen/pelvis: Hepatobiliary: Multiple low-density lesions are seen throughout the liver parenchyma the largest within the right  liver lobe measuring 3.6 cm. Main portal vein is patent. Partially calcified gallstones are present. No intrahepatic biliary ductal dilatation. Evidence of calcified gallstones, gallbladder wall thickening or biliary dilatation. Pancreas: Unremarkable. No pancreatic ductal dilatation or surrounding inflammatory changes. Spleen: Normal in size without focal abnormality. Adrenals/Urinary Tract: Both adrenal glands appear normal. Bilateral low-density lesions are seen within both kidneys the largest partially exophytic off the upper pole of the left kidney measuring 7.8 cm. No renal or collecting system calculi. No hydronephrosis. The bladder is grossly unremarkable. Stomach/Bowel: The stomach, small bowel, and colon are normal in appearance. No inflammatory changes, wall thickening, or obstructive findings.Scattered colonic diverticula are noted. Vascular/Lymphatic: There are no enlarged mesenteric, retroperitoneal, or pelvic lymph nodes. Scattered aortic atherosclerotic calcifications are seen without aneurysmal dilatation. Reproductive: Suboptimally visualized due to overlying streak artifact. Other: No evidence of abdominal wall mass or hernia. Musculoskeletal: No acute or significant osseous findings. Bilateral total hip arthroplasties are present. Degenerative changes are seen in the thoracolumbar spine. IMPRESSION: 1. Equivocal tiny filling defect in the posterior right lower lobe subsegmental pulmonary artery which could represent a small pulmonary embolism versus non opacification. No large or central pulmonary embolism. 2. Small bilateral pleural effusions and dependent atelectasis 3. No acute intra-abdominal or pelvic pathology to explain the patient's symptoms 4. Nondisplaced lateral right fourth rib fracture 5.  Aortic Atherosclerosis (ICD10-I70.0). Electronically Signed   By: Prudencio Pair M.D.   On: 08/06/2019 21:21   DG Chest [facility] [address]  Result Date: 08/06/2019 CLINICAL DATA:  Fatigue EXAM: PORTABLE  CHEST 1 VIEW COMPARISON:  April 13, 2019 FINDINGS: There is unchanged cardiomegaly. Aortic knob calcifications are seen. Trace right pleural effusion is seen. There is mild prominence of the central pulmonary vasculature. Overlying loop recorder is seen. No acute osseous abnormality. IMPRESSION: Mild pulmonary vascular congestion and trace right pleural effusion. Electronically Signed   By: Prudencio Pair M.D.   On: 08/06/2019 20:00   ECHOCARDIOGRAM COMPLETE  Result Date: 08/07/2019    ECHOCARDIOGRAM REPORT   Patient Name:   EBRAHIM DEREMER Date of Exam: 08/07/2019 Medical Rec #:  [id number]        Height:       68.0 in Accession #:    [id number]       Weight:       165.0 lb Date of Birth:  [birth year]        BSA:          1.883 m Patient Age:    62 years         BP:           142/101 mmHg Patient Gender: M                HR:           85 bpm. Exam Location:  Inpatient Procedure: 2D Echo, 3D Echo, Color Doppler and Cardiac Doppler Indications:    W25.85 Acute diastolic (congestive) heart failure  History:        Patient has prior history of Echocardiogram examinations, most                 recent 10/20/2018. CHF, CAD, Pacemaker, Arrythmias:Atrial                 Fibrillation; Risk Factors:Hypertension and Sleep Apnea. COVID+                 on 11/07/18.  Sonographer:    Raquel Sarna Senior RDCS Referring Phys: [id number] White T [facility]  Sonographer Comments: Windows limited by small rib spacing. IMPRESSIONS  1. Left ventricular ejection fraction by 3D volume is 21 %. The left ventricle has severely decreased function. The left ventricle demonstrates regional wall motion abnormalities (see scoring diagram/findings for description). There is mild left ventricular hypertrophy. Left ventricular diastolic parameters are indeterminate.  2. Right ventricular systolic function is moderately reduced. The right ventricular size is normal. There is mildly elevated pulmonary artery systolic pressure. The estimated right ventricular systolic  pressure is 35.3 mmHg.  3. Left atrial size was moderately dilated.  4. Right atrial size was moderately dilated.  5. The mitral valve is degenerative. Mild mitral valve regurgitation. No evidence of mitral stenosis.  6. The aortic valve is abnormal. Aortic valve regurgitation is trivial. No aortic stenosis is present.  7. The inferior vena cava is dilated in size with <50% respiratory variability, suggesting right atrial pressure of 15 mmHg. Comparison(s): A prior study was performed on 10/20/18. Prior images reviewed side by side. LVEF has decreased. FINDINGS  Left Ventricle: Left ventricular ejection fraction by 3D volume is 21 %. The left ventricle has severely decreased function. The left ventricle demonstrates regional wall motion abnormalities. The left ventricular internal cavity size was normal in size. There is mild left ventricular hypertrophy. Left ventricular diastolic parameters are indeterminate.  LV Wall Scoring: The mid and distal lateral wall, mid anterolateral segment, mid inferoseptal segment, apical septal segment, mid anterior segment, and apical inferior segment are akinetic. The mid  anteroseptal segment, basal anterolateral segment, apical anterior segment, mid inferior segment, and basal inferoseptal segment are hypokinetic. The basal anteroseptal segment is normal. Right Ventricle: The right ventricular size is normal. No increase in right ventricular wall thickness. Right ventricular systolic function is moderately reduced. There is mildly elevated pulmonary artery systolic pressure. The tricuspid regurgitant velocity is 2.57 m/s, and with an assumed right atrial pressure of 15 mmHg, the estimated right ventricular systolic pressure is 16.1 mmHg. Left Atrium: Left atrial size was moderately dilated. Right Atrium: Right atrial size was moderately dilated. Pericardium: There is no evidence of pericardial effusion. Mitral Valve: The mitral valve is degenerative in appearance. Mild mitral  valve regurgitation. No evidence of mitral valve stenosis. Tricuspid Valve: The tricuspid valve is normal in structure. Tricuspid valve regurgitation is trivial. Aortic Valve: The aortic valve is abnormal. Aortic valve regurgitation is trivial. No aortic stenosis is present. There is severe calcifcation of the aortic valve. Pulmonic Valve: The pulmonic valve was normal in structure. Pulmonic valve regurgitation is mild. Aorta: The aortic root is normal in size and structure. Venous: The inferior vena cava is dilated in size with less than 50% respiratory variability, suggesting right atrial pressure of 15 mmHg. IAS/Shunts: No atrial level shunt detected by color flow Doppler.  LEFT VENTRICLE PLAX 2D LVIDd:         4.30 cm LVIDs:         4.10 cm LV PW:         1.20 cm         3D Volume EF LV IVS:        1.30 cm         LV 3D EF:    Left LVOT diam:     2.00 cm                      ventricular LV SV:         32                           ejection LV SV Index:   17                           fraction by LVOT Area:     3.14 cm                     3D volume                                             is 21 %.                                 3D Volume EF:                                3D EF:        21 % RIGHT VENTRICLE RV S prime:     5.36 cm/s TAPSE (M-mode): 1.3 cm LEFT ATRIUM              Index       RIGHT ATRIUM           Index  LA diam:        4.60 cm  2.44 cm/m  RA Area:     25.00 cm LA Vol (A2C):   112.0 ml 59.47 ml/m RA Volume:   80.80 ml  42.90 ml/m LA Vol (A4C):   65.8 ml  34.94 ml/m LA Biplane Vol: 86.6 ml  45.98 ml/m  AORTIC VALVE LVOT Vmax:   64.40 cm/s LVOT Vmean:  42.000 cm/s LVOT VTI:    0.102 m  AORTA Ao Root diam: 3.60 cm Ao Asc diam:  3.70 cm TRICUSPID VALVE TR Peak grad:   26.4 mmHg TR Vmax:        257.00 cm/s  SHUNTS Systemic VTI:  0.10 m Systemic Diam: 2.00 cm Cherlynn Kaiser MD Electronically signed by Cherlynn Kaiser MD Signature Date/Time: 08/07/2019/12:31:38 PM    Final     Scheduled  Meds: . apixaban  5 mg Oral BID  . atorvastatin  40 mg Oral QHS  . clopidogrel  75 mg Oral Q breakfast  . furosemide  40 mg Intravenous Daily  . isosorbide mononitrate  30 mg Oral Daily  . metoprolol tartrate  50 mg Oral BID  . potassium chloride  40 mEq Oral Daily  . sodium chloride flush  3 mL Intravenous Once  . sodium chloride flush  3 mL Intravenous Q12H   Continuous Infusions: . sodium chloride       LOS: 0 days    Time spent: 25 mins.    Shawna Clamp, MD [facility]   If 7PM-7AM, please contact night-coverage

## 2019-08-07 NOTE — ED Notes (Signed)
Call received from pt daughter Finnbar Cedillos [phone] requesting rtn call for pt status/updates. [employer]

## 2019-08-07 NOTE — ED Notes (Addendum)
Entered room to check on Pt.  Pt standing fully dressed at the bedside stating he is going home.  Pt noted to be confused and mumbling about a wedding and a godson.  Pt redirected back into the bed and replaced on the monitor.   Pt reports he lives at home alone w/ his dog.

## 2019-08-07 NOTE — Care Plan (Addendum)
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  Recommended blood pressure control and repeat CT head tomorrow morning. Discussed with Neuro surgery, doesn't think it is trauma related, Patient might have tumor which bleed or it could be HTN bleed, No acute surgical intervention needed. Recommended Neuro consult for further recommendation. Neurology on call paged, awaiting recommendation.Will notify night team to follow up.

## 2019-08-07 NOTE — Care Plan (Signed)
Spoke with daughter over the phone. she reports patient has been confused and has fallen and has head injury.  CT head stat ordered.  Will notify night team to follow-up.

## 2019-08-07 NOTE — Progress Notes (Signed)
Patient ID: Brian Brown, male   DOB: 1932/08/10, 86 y.o.   MRN: [id number] BP (!) 130/94   Pulse 78   Temp 97.9 F (36.6 C) (Oral)   Resp 16   Ht 5\' 8"  (1.727 m)   Wt 76.2 kg   SpO2 96%   BMI 25.54 kg/m  CT is reviewed. The mass is seen in the posterior limb of the internal capsule. The report is that this is blood which is acute. There is no operative indications, and neurology should be consulted. Nothing to suggest this is a result of head trauma, does not appear to be a contusion, nor is the mass located in a typical intracerebral location for a traumatic hemorrhage.  No neurosurgical problems at this time.

## 2019-08-07 NOTE — Progress Notes (Signed)
eLink Physician-Brief Progress Note Patient Name: Brian Brown DOB: Oct 23, 1932 MRN: [id number]   Date of Service  08/07/2019  HPI/Events of Note  Received call from [employer] NP) who informs me that this patient has a small 68mm ICH for which Dr. Londell Moh (on-call neurologist) has recommended the patient be transferred to [facility] Neuro[facility] if there is an available bed, otherwise transfer to [facility] ED.  Threasa Beards informs me that Dr. Londell Moh does not recommend administration of eliquis reversal agents at this time.   eICU Interventions  I helped facilitate a call between Melanie, myself and [facility] to start the process of transferring the patient to [facility] (NeuroICU vs ED depending on bed availability).     Intervention Category Minor Interventions: Other:;Communication with other healthcare providers and/or family  RAMONDO DIETZE 08/07/2019, 8:36 PM

## 2019-08-07 NOTE — ED Notes (Addendum)
Called pt daughter Randel Hargens [phone]. She is requesting provider contact her. They are trying to make travel arrangements from [place] based on pt condition. Messaged provider.

## 2019-08-07 NOTE — Progress Notes (Signed)
Spoke with Dr Lorraine Lax, neuro on call, who requested pt be transferred to [facility] Neuro ICU and treated as a hemorrhagic stroke.  BP goal < 140/90, using Labetalol and hydralazine to manage.  No reversal of Eliquis and plavix, which has been held, at this time.  Head CT to be repeated in 3hours at 11pm to assess for worsening hemorrhage.  Spoke with Dr Gillermina Phy at [place] requesting transfer.

## 2019-08-08 ENCOUNTER — Inpatient Hospital Stay (HOSPITAL_COMMUNITY): Payer: Medicare HMO

## 2019-08-08 DIAGNOSIS — E43 Unspecified severe protein-calorie malnutrition: Secondary | ICD-10-CM | POA: Insufficient documentation

## 2019-08-08 DIAGNOSIS — I5020 Unspecified systolic (congestive) heart failure: Secondary | ICD-10-CM

## 2019-08-08 DIAGNOSIS — R600 Localized edema: Secondary | ICD-10-CM

## 2019-08-08 DIAGNOSIS — I610 Nontraumatic intracerebral hemorrhage in hemisphere, subcortical: Secondary | ICD-10-CM

## 2019-08-08 DIAGNOSIS — Z8673 Personal history of transient ischemic attack (TIA), and cerebral infarction without residual deficits: Secondary | ICD-10-CM

## 2019-08-08 LAB — MRSA PCR SCREENING: MRSA by PCR: NEGATIVE

## 2019-08-08 MED ORDER — CARVEDILOL 12.5 MG PO TABS: 12.5000 mg | ORAL_TABLET | Freq: Two times a day (BID) | ORAL | Status: DC

## 2019-08-08 MED ORDER — CARVEDILOL 3.125 MG PO TABS
3.1250 mg | ORAL_TABLET | Freq: Two times a day (BID) | ORAL | Status: DC
  Administered 2019-08-09 (×2): 3.125 mg via ORAL
  Filled 2019-08-08 (×3): qty 1

## 2019-08-08 MED ORDER — ADULT MULTIVITAMIN W/MINERALS CH
1.0000 | ORAL_TABLET | Freq: Every day | ORAL | Status: DC
  Administered 2019-08-09 – 2019-08-11 (×3): 1 via ORAL
  Filled 2019-08-08 (×3): qty 1

## 2019-08-08 MED ORDER — LOSARTAN POTASSIUM 50 MG PO TABS: 50.0000 mg | ORAL_TABLET | Freq: Every day | ORAL | Status: DC

## 2019-08-08 MED ORDER — ENSURE ENLIVE PO LIQD
237.0000 mL | Freq: Three times a day (TID) | ORAL | Status: DC
  Administered 2019-08-08 – 2019-08-11 (×8): 237 mL via ORAL

## 2019-08-08 MED ORDER — LOSARTAN POTASSIUM 50 MG PO TABS: 25.0000 mg | ORAL_TABLET | Freq: Every day | ORAL | Status: DC

## 2019-08-08 MED ORDER — POTASSIUM CHLORIDE 20 MEQ PO PACK
40.0000 meq | PACK | Freq: Four times a day (QID) | ORAL | Status: DC
  Filled 2019-08-08: qty 2

## 2019-08-08 MED ORDER — POTASSIUM CHLORIDE 10 MEQ/100ML IV SOLN
10.0000 meq | INTRAVENOUS | Status: AC
  Administered 2019-08-08: 10 meq via INTRAVENOUS
  Filled 2019-08-08 (×4): qty 100

## 2019-08-08 MED ORDER — GADOBUTROL 1 MMOL/ML IV SOLN
7.0000 mL | Freq: Once | INTRAVENOUS | Status: AC | PRN
  Administered 2019-08-08: 7 mL via INTRAVENOUS

## 2019-08-08 MED ORDER — FUROSEMIDE 10 MG/ML IJ SOLN
40.0000 mg | Freq: Four times a day (QID) | INTRAMUSCULAR | Status: AC
  Administered 2019-08-08 (×3): 40 mg via INTRAVENOUS
  Filled 2019-08-08 (×3): qty 4

## 2019-08-08 MED ORDER — CHLORHEXIDINE GLUCONATE CLOTH 2 % EX PADS
6.0000 | MEDICATED_PAD | Freq: Every day | CUTANEOUS | Status: DC
  Administered 2019-08-08 – 2019-08-10 (×3): 6 via TOPICAL

## 2019-08-08 MED ORDER — LOSARTAN POTASSIUM 25 MG PO TABS
25.0000 mg | ORAL_TABLET | Freq: Every day | ORAL | Status: DC
  Administered 2019-08-09 – 2019-08-11 (×3): 25 mg via ORAL
  Filled 2019-08-08 (×3): qty 1

## 2019-08-08 MED ORDER — LISINOPRIL 10 MG PO TABS
10.0000 mg | ORAL_TABLET | Freq: Every day | ORAL | Status: DC
  Administered 2019-08-08: 10 mg via ORAL
  Filled 2019-08-08: qty 1

## 2019-08-08 MED ORDER — POTASSIUM CHLORIDE 10 MEQ/100ML IV SOLN
10.0000 meq | INTRAVENOUS | Status: AC
  Administered 2019-08-08 (×3): 10 meq via INTRAVENOUS

## 2019-08-08 MED ORDER — FAMOTIDINE 20 MG PO TABS
20.0000 mg | ORAL_TABLET | Freq: Every day | ORAL | Status: DC
  Administered 2019-08-08 – 2019-08-10 (×3): 20 mg via ORAL
  Filled 2019-08-08 (×3): qty 1

## 2019-08-08 NOTE — Progress Notes (Signed)
NAME:  Brian Brown, MRN:  [id number], DOB:  [birth year], LOS: 1 ADMISSION DATE:  08/06/2019, CONSULTATION DATE:  08/08/19 REFERRING MD:  Brian Brown, ED, CHIEF COMPLAINT:  Confusion   Brief History   86 y.o. M with a PMH of stroke with residual R sided weakness, CAD, PAF on Eliquis and metoprolol, HTN, Cardiomyopathy with new LVEF of 21% (from 45-50% in 2020) admitted for L thalamic ICH and acute systolic heart failure.   Significant Hospital Events   8/2 > CTH showing R thalamic ICH, transferred to [facility] neuro ICU.  Consults:  Neurology Cardiology   Procedures:  8/2> Echo  Significant Diagnostic Tests:  08/07/19> CTA Chest: equivocal tiny filling defect in the posterior right lower lobe subsegmental pulmonary artery. Small bilateral pleural effusions.  Non displaced right 4th rib fx.  08/07/19> CT Abdomen/Pelvis: Multiple low density lesions throughout the liver parenchyma, Largest within the right liver lobe measures 3.6 cm, bilateral low density kidney lesions, largest off the upper pole of left kdney measuring 7.8 cm. 08/07/19> CT Head: 10 mm left thalamic ICH without mass effect/edema. chronic infarct of the right occipital lobe 08/07/19> ECHO: LVEF 20% with regional wall motion abnormalities.  LA moderately dilated, RA moderately dilated, mild MVR. 08/08/19> MR Brain W WO Contrast> 1 cm hemorrhage R lateral thalamus appears subacute with methemoglobin present. Additional areas of chronic micro hemorrhage in the brain, may be due to hypertension. Negative for metastatic disease, no enhancing lesions in the brain. Moderate chronic microvascular ischemic change in the white matter. Negative MRA head.    Micro Data:  8/1  COVID> Negative 8/3 MRSA> Negative   Antimicrobials:    Interim history/subjective:  Brian Brown was seen at bedside this morning. His daughters were present. We discussed his stroke and heart failure and discussed further optimizing his heart failure medications in combination of PT  and OT to improve quality of life.  Objective   Blood pressure (!) 130/100, pulse 69, temperature 98.1 F (36.7 C), temperature source Oral, resp. rate 20, height 5\' 8"  (1.727 m), weight 72.4 kg, SpO2 99 %.        Intake/Output Summary (Last 24 hours) at 08/08/2019 0958 Last data filed at 08/08/2019 0800 Gross per 24 hour  Intake 237.81 ml  Output 1300 ml  Net -1062.19 ml   Filed Weights   08/06/19 1636 08/07/19 1351 08/08/19 0250  Weight: 74.8 kg 76.2 kg 72.4 kg    Examination: Physical Exam Constitutional:      Comments: Appears stated age, intermittently able to answer questions.   HENT:     Head: Normocephalic and atraumatic.  Eyes:     Conjunctiva/sclera: Conjunctivae normal.     Pupils: Pupils are equal, round, and reactive to light.  Cardiovascular:     Rate and Rhythm: Normal rate. Rhythm irregular.     Heart sounds: Normal heart sounds. No murmur heard.  No friction rub. No gallop.      Comments: Irregularly irregular rhythm  Pulmonary:     Effort: Pulmonary effort is normal.     Breath sounds: Normal breath sounds. No wheezing, rhonchi or rales.  Abdominal:     General: Abdomen is flat. Bowel sounds are normal.  Musculoskeletal:     Right lower leg: No edema.     Left lower leg: No edema.  Skin:    General: Skin is warm.  Neurological:     Mental Status: He is alert.     Comments: Oriented to self, but not place or  time. Able to move all extremities spontaneously.      Resolved Hospital Problem list     Assessment & Plan:  Brian Brown is a 86 y/o M, with a PMH of CAD, CKD, Hepatitis, HTN, PAF, and Stroke, who presented with confusion, with imaging showing L thalamic ICH, admitted to the [facility] Neuro ICU for [place] and systolic heart failure.   Acute Systolic Heart Failure - ECHO on 8/2 shows reduced LVEF of 21% with global hypokinesis. Previous  Echo from 2020 showed LVEF of 45-50%. - Troponins flat 35<35 - EKG without ST elevations and flat trops - BNP:  1349 - Cardiology consulted by primary team - Lasix IV 40 mg TID for 3 doses.  - Trend BMP and Magnesium, replenish as needed.  - recheck BNP to look at trend  - Strict I/O: -1300 ON - Daily weights  - Restart Zestril 10 mg QD - Lopressor 50 mg BID   Acute Respiratory Insufficiency Secondary to Above Bilateral Pleural Effusions Breathing comfortably on room air.  -Continue diuresis   Left thalamic ICH, on eliquis, holding  - Etiology: Likely HTN given location,  - Neurology following - Goal SBP < 140 and DBP < 90 - Repeat imaging this evening per Neuro - Serial neurological exams.  - Consider reversal agents if hemorrhage expansion or neurological decline - MRI   HTN --Goal SBP < 140 and DBP < 90 in setting of ICH Plan: --PRN labetalol and hydralazine available.   - I suspect the lasix will help drop his bp along with diuresis - Lopressor 50 mg BID -  Lisinopril 10 mg QD  Right 4th rib fx - Tylenol 650 mg Q4H PRN  pAfib CAD -Hold home Eliquis in setting of ICH -Continue metoprolol for rate control  -Goal K > 4 and Mg > 2  History of stroke - Baseline: residual right sided weakness - Hold home Plavix in setting of ICH  HLD - Continue home statin  Acute kidney injury in setting of CKD, stage II Bilateral low density kidney lesions - Kidney ultrasound 2014: right and left kidney with multiple renal cysts - Baseline creatinine: 1.1-1.4 - Continues to improve 1.25 from 1.43 ON  Liver lesions -Multiple low density lesions noted on CT Abdomen with largest within the right liver lobe measures 3.6 cm.  Elevated LFTs - Suspect multifactorial with liver lesions, known history of Hepatitis C Virus, and hepatic congestion in setting of heart failure. -Trend CMP  Best practice:  Diet: Regular diet Pain/Anxiety/Delirium protocol (if indicated): Consider lidocaine patch for rib pain VAP protocol (if indicated): n/a DVT prophylaxis: hold chemo-ppx in setting  of acute ICH, SCDs GI prophylaxis: Pepcid Glucose control: daily BG monitoring Mobility: Ambulate with assistance Code Status: FULL Family Communication: NOK: Daughter Disposition: ICU  Labs   CBC: Recent Labs  Lab 08/06/19 1642  WBC 6.0  HGB 12.9*  HCT 39.2  MCV 91.6  PLT 578    Basic Metabolic Panel: Recent Labs  Lab 08/06/19 1642 08/07/19 0449  NA 142 141  K 3.4* 3.6  CL 106 100  CO2 25 27  GLUCOSE 123* 109*  BUN 25* 20  CREATININE 1.43* 1.25*  CALCIUM 8.7* 9.5   GFR: Estimated Creatinine Clearance: 41.8 mL/min (A) (by C-G formula based on SCr of 1.25 mg/dL (H)). Recent Labs  Lab 08/06/19 1642  WBC 6.0    Liver Function Tests: Recent Labs  Lab 08/06/19 1642  AST 53*  ALT 65*  ALKPHOS 89  BILITOT 1.5*  PROT 6.8  ALBUMIN 3.4*   No results for input(s): LIPASE, AMYLASE in the last 168 hours. No results for input(s): AMMONIA in the last 168 hours.  ABG    Component Value Date/Time   TCO2 26 03/06/2014 0919     Coagulation Profile: No results for input(s): INR, PROTIME in the last 168 hours.  Cardiac Enzymes: No results for input(s): CKTOTAL, CKMB, CKMBINDEX, TROPONINI in the last 168 hours.  HbA1C: Hgb A1c MFr Bld  Date/Time Value Ref Range Status  02/14/2018 03:03 AM 5.8 (H) 4.8 - 5.6 % Final    Comment:    (NOTE) Pre diabetes:          5.7%-6.4% Diabetes:              >6.4% Glycemic control for   <7.0% adults with diabetes     CBG: No results for input(s): GLUCAP in the last 168 hours.  Review of Systems:   Review of Systems  Constitutional: Positive for malaise/fatigue and weight loss.  Respiratory: Negative for shortness of breath and wheezing.   Cardiovascular: Negative for chest pain and palpitations.  Neurological: Positive for weakness.     Past Medical History  He,  has a past medical history of Arthritis, Bleeding stomach ulcer (25 yrs ago), CAD (coronary artery disease), Cancer ([place]) (2002), Cardiomyopathy ([place]),  CKD (chronic kidney disease), stage II, Hepatitis (1981), Hypertension, Mild dilation of ascending aorta ([place]), PAF (paroxysmal atrial fibrillation) ([place]), Sleep apnea (2005), Stroke ([place]) (2003), Wears dentures, and Wears glasses.   Surgical History    Past Surgical History:  Procedure Laterality Date  . BUBBLE STUDY  09/16/2018   Procedure: BUBBLE STUDY;  Surgeon: Lelon Perla, MD;  Location: [place];  Service: Cardiovascular;;  . CARDIAC CATHETERIZATION  10/19/2018  . CARPAL TUNNEL RELEASE Left 03/29/2013   Procedure: LEFT CARPAL TUNNEL RELEASE;  Surgeon: Wynonia Sours, MD;  Location: [place];  Service: Orthopedics;  Laterality: Left;  ANESTHESIA: IV REGIONAL FAB  . CARPAL TUNNEL RELEASE Right 03/06/2014   Procedure: RIGHT CARPAL TUNNEL RELEASE;  Surgeon: Daryll Brod, MD;  Location: [place];  Service: Orthopedics;  Laterality: Right;  . COLONOSCOPY    . HERNIA REPAIR  2009   rt inguinal hernia  . JOINT REPLACEMENT  12/2001   rt hip ;[place] CATH AND CORONARY ANGIOGRAPHY N/A 10/19/2018   Procedure: LEFT HEART CATH AND CORONARY ANGIOGRAPHY;  Surgeon: Wellington Hampshire, MD;  Location: [place] CV LAB;  Service: Cardiovascular;  Laterality: N/A;  . LOOP RECORDER INSERTION N/A 09/16/2018   Procedure: LOOP RECORDER INSERTION;  Surgeon: Constance Haw, MD;  Location: [place] [facility];  Service: Cardiovascular;  Laterality: N/A;  . PROSTATECTOMY  2002  . SHOULDER ARTHROSCOPY W/ ROTATOR CUFF REPAIR  2014   lt-dr whitfield  . TEE WITHOUT CARDIOVERSION N/A 09/16/2018   Procedure: TRANSESOPHAGEAL ECHOCARDIOGRAM (TEE);  Surgeon: Lelon Perla, MD;  Location: [facility] ENDOSCOPY;  Service: Cardiovascular;  Laterality: N/A;  . TOTAL HIP ARTHROPLASTY  02/03/2011   Procedure: TOTAL HIP ARTHROPLASTY;  Surgeon: Garald Balding, MD;  Location: [place] [facility];  Service: Orthopedics;  Laterality: Left;  . TRIGGER FINGER RELEASE Left 03/29/2013    Procedure: LEFT MIDDLE/SMALL FINGER RELEASE A-1 PULLEY AND LEFT RING FINGER;  Surgeon: Wynonia Sours, MD;  Location: [place];  Service: Orthopedics;  Laterality: Left;  . TRIGGER FINGER RELEASE Right 03/06/2014   Procedure: RIGHT MIDDLE FINGER,RIGHT RINGER FINGER;  Surgeon: Daryll Brod, MD;  Location: [place];  Service: Orthopedics;  Laterality: Right;     Social History   reports that he quit smoking about 11 years ago. His smoking use included cigarettes. He has a 30.00 pack-year smoking history. He has never used smokeless tobacco. He reports that he does not drink alcohol and does not use drugs.   Family History   His family history includes Heart disease in his father and mother.   Allergies No Known Allergies   Home Medications  Prior to Admission medications   Medication Sig Start Date End Date Taking? Authorizing Provider  apixaban (ELIQUIS) 5 MG TABS tablet Take 1 tablet (5 mg total) by mouth 2 (two) times daily. 11/22/18  Yes Jettie Booze, MD  atorvastatin (LIPITOR) 40 MG tablet TAKE 1 TABLET EVERY DAY Patient taking differently: Take 40 mg by mouth at bedtime.  07/24/19  Yes Jettie Booze, MD  clopidogrel (PLAVIX) 75 MG tablet Take 1 tablet (75 mg total) by mouth daily with breakfast. 11/22/18  Yes Jettie Booze, MD  furosemide (LASIX) 20 MG tablet Take 1 tablet (20 mg total) by mouth daily as needed for fluid. Patient taking differently: Take 20 mg by mouth daily.  11/22/18  Yes Jettie Booze, MD  ibuprofen (ADVIL) 200 MG tablet Take 200 mg by mouth daily as needed for headache.   Yes [provider]  isosorbide mononitrate (IMDUR) 30 MG 24 hr tablet Take 1 tablet (30 mg total) by mouth daily. 11/22/18  Yes Jettie Booze, MD  latanoprost (XALATAN) 0.005 % ophthalmic solution Place 1 drop into both eyes at bedtime.   Yes [provider]  lisinopril (ZESTRIL) 10 MG tablet Take 1 tablet (10 mg  total) by mouth daily. 11/22/18 11/22/19 Yes Jettie Booze, MD  metoprolol tartrate (LOPRESSOR) 25 MG tablet Take 3 tablets (75 mg total) by mouth 2 (two) times daily. Patient taking differently: Take 50 mg by mouth 2 (two) times daily.  02/23/19  Yes Camnitz, Ocie Doyne, MD  nitroGLYCERIN (NITROSTAT) 0.4 MG SL tablet Place 1 tablet (0.4 mg total) under the tongue every 5 (five) minutes x 3 doses as needed for chest pain. 11/22/18  Yes Jettie Booze, MD  OVER THE COUNTER MEDICATION Take 2-3 capsules by mouth See admin instructions. Omega XL - take 3 capsules by mouth every morning and 2 capsules at night   Yes [provider]     Critical care time:Neylan Koroma Gilford Rile, MD IMTS, PGY-2 Pager: [phone] 08/08/2019,12:46 PM

## 2019-08-08 NOTE — Progress Notes (Signed)
STROKE TEAM PROGRESS NOTE   INTERVAL HISTORY I have personally reviewed history of presenting illness, electronic medical records and imaging films in PACS.  CT scan shows small right thalamic intracerebral hemorrhage but patient's CT scan of the abdomen pelvis have shown multiple hypodense lesion raising concern for malignancy.  MRI scan of the brain has been ordered and is pending.  Patient neurological exam remains unchanged.  Vital signs stable blood pressure adequately controlled.  Vitals:   08/08/19 0645 08/08/19 0700 08/08/19 0800 08/08/19 0803  BP: (!) 125/97 (!) 121/105  (!) 130/100  Pulse: 84 69    Resp: 19 17  20   Temp:   98.1 F (36.7 C)   TempSrc:   Oral   SpO2: 100% 99%    Weight:      Height:       CBC:  Recent Labs  Lab 08/06/19 1642  WBC 6.0  HGB 12.9*  HCT 39.2  MCV 91.6  PLT 027   Basic Metabolic Panel:  Recent Labs  Lab 08/06/19 1642 08/07/19 0449  NA 142 141  K 3.4* 3.6  CL 106 100  CO2 25 27  GLUCOSE 123* 109*  BUN 25* 20  CREATININE 1.43* 1.25*  CALCIUM 8.7* 9.5   Lipid Panel: No results for input(s): CHOL, TRIG, HDL, CHOLHDL, VLDL, LDLCALC in the last 168 hours. HgbA1c: No results for input(s): HGBA1C in the last 168 hours. Urine Drug Screen: No results for input(s): LABOPIA, COCAINSCRNUR, LABBENZ, AMPHETMU, THCU, LABBARB in the last 168 hours.  Alcohol Level No results for input(s): ETH in the last 168 hours.  IMAGING past 24 hours CT HEAD WO CONTRAST  Result Date: 08/07/2019 CLINICAL DATA:  Stroke.  Follow-up intracranial hemorrhage. EXAM: CT HEAD WITHOUT CONTRAST TECHNIQUE: Contiguous axial images were obtained from the base of the skull through the vertex without intravenous contrast. COMPARISON:  CT head without contrast 08/07/2019 at 6:10 p.m. FINDINGS: Brain: Right thalamic hemorrhage is stable in size at 10 mm. Surrounding edema is stable. No new hemorrhage is present. Moderate atrophy and white matter changes are stable. Cortical  infarct of the right parietal lobe is stable. No new infarcts are present. Basal ganglia are otherwise within normal limits. Remote infarct of the anterior right insular cortex is again noted. Remote infarct of the medial right occipital lobe is again noted. Vascular: No hyperdense vessel or unexpected calcification. Skull: Calvarium is intact. No focal lytic or blastic lesions are present. No significant extracranial soft tissue lesion is present. Sinuses/Orbits: The paranasal sinuses and mastoid air cells are clear. Bilateral lens replacements are noted. Globes and orbits are otherwise unremarkable. IMPRESSION: 1. Stable 10 mm right thalamic hemorrhage with surrounding edema. 2. Stable atrophy and white matter disease. 3. Stable remote infarcts of the right parietal lobe, anterior right insular cortex, and medial right occipital lobe. Electronically Signed   By: San Morelle M.D.   On: 08/07/2019 22:49   CT HEAD WO CONTRAST  Result Date: 08/07/2019 CLINICAL DATA:  Mental status change, confusion EXAM: CT HEAD WITHOUT CONTRAST TECHNIQUE: Contiguous axial images were obtained from the base of the skull through the vertex without intravenous contrast. COMPARISON:  CT head 02/13/2018 FINDINGS: Brain: 10 mm hyperdensity right thalamus is new since the prior study most consistent with acute hemorrhage. No significant mass-effect or surrounding edema. Ventricle size normal. Patchy white matter hypodensity bilaterally is chronic and unchanged. Chronic infarct right occipital lobe was not present previously. Negative for acute infarct. Vascular: Negative for hyperdense vessel Skull: Negative  Sinuses/Orbits: Negative Other: None IMPRESSION: 10 mm hyperdensity right thalamus compatible with acute hemorrhage. Atrophy and chronic ischemic change. Chronic infarct right occipital lobe. These results were called by telephone at the time of interpretation on 08/07/2019 at 6:45 pm to provider [facility] , who verbally  acknowledged these results. Electronically Signed   By: Franchot Gallo M.D.   On: 08/07/2019 18:43    PHYSICAL EXAM Pleasant elderly male not in distress. . Afebrile. Head is nontraumatic. Neck is supple without bruit.    Cardiac exam no murmur or gallop. Lungs are clear to auscultation. Distal pulses are well felt. Neurological Exam : Patient is awake alert oriented to time place and person.  Slightly diminished attention, registration and recall.  Follows simple midline and 1 and occasional two-step commands.  Extraocular movements are full range without nystagmus.  Speech is clear without dysarthria or aphasia.  Blinks to threat bilaterally.  Face is symmetric without weakness.  Tongue midline.  Motor system exam shows symmetric upper and lower extremity strength without focal weakness or drift.  Sensation is intact bilaterally coordination is slow but accurate gait not tested. ASSESSMENT/PLAN [name] is a 86 y.o. male with history of CVA with right-sided weakness, paroxysmal atrial fibrillation on Eliquis, hypertension, coronary artery disease, CHF, HCV presented with 2 to 3 weeks of fatigue, increased lower extremity edema and abdominal pain admitted on 8/1 for acute systolic heart failure.  On the morning of 8/2 patient confused. CT head showed a 10 mm left thalamic ICH. Neurology was consulted. transferred to [facility], ICU for monitoring.   Stroke:   R lateral thalamic ICH secondary on eliquis associated coagulapathy   CT head R thalamic hemorrhage. Small vessel disease. Atrophy. Old R occipital infarct.   Repeat CT head later same day stable   MRI w/w/o R lateral thalamic ICH. Chronic microhemorrhages throughout. No mets. Small vessel disease.   MRA  negative  CT head in am pending   2D Echo EF 21%. No source of embolus> LA moderate dilated.   LDL pending   HgbA1c pending   VTE prophylaxis - SCDs   clopidogrel 75 mg daily and Eliquis (apixaban) daily prior to  admission, now on No antithrombotic given hemorrhage   Therapy recommendations:  pending   Disposition:  pending   Atrial Fibrillation  Home anticoagulation:  Eliquis (apixaban) daily   Off AC given hemorrhage   Hypertension  Stable . SBP goal < 140 . Long-term BP goal normotensive  Hyperlipidemia  Home meds:  liptior 20, resumed in hospital  LDL pending, goal < 70  Recommend holding statin in setting of acute hemorrhage   Ok to resume statin at discharge  Other Stroke Risk Factors  Advanced age  Former Cigarette smoker  Hx stroke/TIA  12/2018 -  right PCA infarct, embolic, source unknown. Later developed AF found on loop. Started on Eliquis  Coronary artery disease s/p NSTEMI 10/2018 d/c on eliquis, plavix + statin  Obstructive sleep apnea, not on CPAP at home  Acute systolic HF  Cardiomyopathy   Other Active Problems  Acute respiratory insufficiency secondary to CHF. B pleural effusions  Prostate cancer s/p OR   R 4th rib fx   AKI in setting of CKD stage II/ B low density kidney lesions  Hepatitis. Liver lesions  Elevated LFTs  Hx covid 11/2018  Hospital day # 1 Patient presented with confusion secondary to small right thalamic hemorrhage likely due to combination of small vessel disease as well as  anticoagulation with Eliquis and Plavix.  Recommend close neurological monitoring and strict blood pressure control with systolic goal below 625 for 24 hours and then below 160.  Hold anticoagulation and antiplatelets for now but given his atrial fibrillation as well as significant coronary artery disease may need to resume them after a few days.  No family available at the bedside for discussion.  Discussed with Dr. Doyne Keel critical care medicine.  This patient is critically ill and at significant risk of neurological worsening, death and care requires constant monitoring of vital signs, hemodynamics,respiratory and cardiac monitoring, extensive review of  multiple databases, frequent neurological assessment, discussion with family, other specialists and medical decision making of high complexity.I have made any additions or clarifications directly to the above note.This critical care time does not reflect procedure time, or teaching time or supervisory time of PA/NP/Med Resident etc but could involve care discussion time.  I spent 30 minutes of neurocritical care time  in the care of  this patient.    Antony Contras, MD  To contact Stroke Continuity provider, please refer to [email or web address] After hours, contact General Neurology

## 2019-08-08 NOTE — Consult Note (Addendum)
Cardiology Consultation:   Patient ID: Brian Brown; [id number]; 1932/04/19   Admit date: 08/06/2019 Date of Consult: 08/08/2019  Primary Care Provider: Seward Carol, MD Primary Cardiologist: Dr. Irish Lack, MD   Patient Profile:   Brian Brown is a 86 y.o. male with a hx of CVA 02/2018 with PAF confirmed on loop recorder on Eliquis, obstructive sleep apnea (not on CPAP), CAD with NSTEMI treated medically as outlined below, mild cardiomyopathy (EF 45-50%), hypertension, hyperlipidemia, CKD II, mild dilation of ascending aorta, and prostate CA who is being seen today for the evaluation of CHF at the request of Dr. Valeta Harms.   History of Present Illness:   Brian Brown is a 86yo M with a hx as stated above who presented to [facility] 08/06/19 with a 2-3 week hx of dyspnea, BLE and abdominal pain. On presentation, CXR showed mild pulmonary congestion and right pleural effusion. Chest CTA with defect in posterior right lower lobe and non displaced lateral right 4th rib fx. He was started on diuresis for acute on chronic CHF. While hospitalized on 08/06/19 he was found to be confused at which time a head CT performed which showed a 10 mm left thalamic ICH without mass effect. Neurology was consulted and recommended transfer to [facility] for blood pressure control and close neurological monitoring. No reversal agents for Eliquis or Plavix per Neuro was used. Repeat echocardiogram from this admission with LVEF down to 21% from 45-50% on previous study. He was placed on IV diuretics (Lasix 40mg  Q6H) x 3 doses. Regarding his ICH, neurology is following with plans to re-image with head MRI and hold Eliquis and Plavix.    He had an initial emobolic stroke in 04/979 and underwent a TEE 09/16/2018 which showed normal LV function and small oscillating density on aortic valve (likely Lambl's excrescence), mild MR, spontaneous contrast in left atrium and left atrial appendage but no thrombus. He subsequently had a loop  recorder placed which demonstrated PAF. His Plavix was stopped given the need for Eliquis.   He was then admitted 10/2018 with chest pain found to have a NSTEMI with a peak trop at 7719. He underwent cardiac cath with severe single-vessel disease with subtotal thrombotic occlusion of distal OM2. The vessel was felt not to be suitable for PCI given trifurcation and was very torturous. Recommendations were for medical therapy with Plavix given thrombotic lesion. He otherwise had nonobstructive disease in other vessels. Echo 10/19/18 showed EF 45-50%, mild LVH, + WMA, impaired diastolic filling, normal RV function, mild LAE/RAE, mild dilation of ascending aorta. Per chart review, it appears that he never filled his Eliquis prescription as previously planned above. There was speculation that a thrombus embolized to the OM coronary region therefore Eliquis was added. No ASA was started given need for Plavix and Eliquis and no PCI.   At follow up 11/01/2018 and again on 11/09/2018 he had complaints of fatigue and he was found to be COVID positive. No changes were made at that time.   On my exam, he remains somewhat confused on the symptoms which brought him to the hospital however states that he feels much better than he has in a while. His daughters are at bedside along with his RN who assisted with HPI. He denies SOB at this time. He has no complaints of chest pain or palpitations. He has significant LE edema on exam.   Past Medical History:  Diagnosis Date  . Arthritis    degenerative joint disease  .  Bleeding stomach ulcer 25 yrs ago  . CAD (coronary artery disease)    a. NSTEMI 10/2018 thrombotic lesion to OM2, 90% D1 (medical therapy), otherwise nonobstructive disease.  . Cancer [facility]) 2002   prostate, s/p surgery  . Cardiomyopathy ([place])    a. EF 45-50 by echo 10/2018.  Marland Kitchen CKD (chronic kidney disease), stage II   . Hepatitis 1981   does not know what kind;denies jaundice  . Hypertension     treated by Dr Delfina Redwood, primary MD  . Mild dilation of ascending aorta ([place])   . PAF (paroxysmal atrial fibrillation) [name]   . Sleep apnea 2005   does not wear a Cpap  . Stroke ([place]) 2003   w/ Rt sided weakness-some-rt leg  . Wears dentures    full top  . Wears glasses     Past Surgical History:  Procedure Laterality Date  . BUBBLE STUDY  09/16/2018   Procedure: BUBBLE STUDY;  Surgeon: Lelon Perla, MD;  Location: [place];  Service: Cardiovascular;;  . CARDIAC CATHETERIZATION  10/19/2018  . CARPAL TUNNEL RELEASE Left 03/29/2013   Procedure: LEFT CARPAL TUNNEL RELEASE;  Surgeon: Wynonia Sours, MD;  Location: [place];  Service: Orthopedics;  Laterality: Left;  ANESTHESIA: IV REGIONAL FAB  . CARPAL TUNNEL RELEASE Right 03/06/2014   Procedure: RIGHT CARPAL TUNNEL RELEASE;  Surgeon: Daryll Brod, MD;  Location: [place];  Service: Orthopedics;  Laterality: Right;  . COLONOSCOPY    . HERNIA REPAIR  2009   rt inguinal hernia  . JOINT REPLACEMENT  12/2001   rt hip ;[place] CATH AND CORONARY ANGIOGRAPHY N/A 10/19/2018   Procedure: LEFT HEART CATH AND CORONARY ANGIOGRAPHY;  Surgeon: Wellington Hampshire, MD;  Location: [place] CV LAB;  Service: Cardiovascular;  Laterality: N/A;  . LOOP RECORDER INSERTION N/A 09/16/2018   Procedure: LOOP RECORDER INSERTION;  Surgeon: Constance Haw, MD;  Location: [place] CV LAB;  Service: Cardiovascular;  Laterality: N/A;  . PROSTATECTOMY  2002  . SHOULDER ARTHROSCOPY W/ ROTATOR CUFF REPAIR  2014   lt-dr whitfield  . TEE WITHOUT CARDIOVERSION N/A 09/16/2018   Procedure: TRANSESOPHAGEAL ECHOCARDIOGRAM (TEE);  Surgeon: Lelon Perla, MD;  Location: [facility] ENDOSCOPY;  Service: Cardiovascular;  Laterality: N/A;  . TOTAL HIP ARTHROPLASTY  02/03/2011   Procedure: TOTAL HIP ARTHROPLASTY;  Surgeon: Garald Balding, MD;  Location: [place];  Service: Orthopedics;  Laterality: Left;  . TRIGGER FINGER  RELEASE Left 03/29/2013   Procedure: LEFT MIDDLE/SMALL FINGER RELEASE A-1 PULLEY AND LEFT RING FINGER;  Surgeon: Wynonia Sours, MD;  Location: [place];  Service: Orthopedics;  Laterality: Left;  . TRIGGER FINGER RELEASE Right 03/06/2014   Procedure: RIGHT MIDDLE FINGER,RIGHT RINGER FINGER;  Surgeon: Daryll Brod, MD;  Location: [place];  Service: Orthopedics;  Laterality: Right;     Prior to Admission medications   Medication Sig Start Date End Date Taking? Authorizing Provider  apixaban (ELIQUIS) 5 MG TABS tablet Take 1 tablet (5 mg total) by mouth 2 (two) times daily. 11/22/18  Yes Jettie Booze, MD  atorvastatin (LIPITOR) 40 MG tablet TAKE 1 TABLET EVERY DAY Patient taking differently: Take 40 mg by mouth at bedtime.  07/24/19  Yes Jettie Booze, MD  clopidogrel (PLAVIX) 75 MG tablet Take 1 tablet (75 mg total) by mouth daily with breakfast. 11/22/18  Yes Jettie Booze, MD  furosemide (LASIX) 20 MG tablet Take 1 tablet (  20 mg total) by mouth daily as needed for fluid. Patient taking differently: Take 20 mg by mouth daily.  11/22/18  Yes Jettie Booze, MD  ibuprofen (ADVIL) 200 MG tablet Take 200 mg by mouth daily as needed for headache.   Yes [provider]  isosorbide mononitrate (IMDUR) 30 MG 24 hr tablet Take 1 tablet (30 mg total) by mouth daily. 11/22/18  Yes Jettie Booze, MD  latanoprost (XALATAN) 0.005 % ophthalmic solution Place 1 drop into both eyes at bedtime.   Yes [provider]  lisinopril (ZESTRIL) 10 MG tablet Take 1 tablet (10 mg total) by mouth daily. 11/22/18 11/22/19 Yes Jettie Booze, MD  metoprolol tartrate (LOPRESSOR) 25 MG tablet Take 3 tablets (75 mg total) by mouth 2 (two) times daily. Patient taking differently: Take 50 mg by mouth 2 (two) times daily.  02/23/19  Yes Camnitz, Ocie Doyne, MD  nitroGLYCERIN (NITROSTAT) 0.4 MG SL tablet Place 1 tablet (0.4 mg total) under the  tongue every 5 (five) minutes x 3 doses as needed for chest pain. 11/22/18  Yes Jettie Booze, MD  OVER THE COUNTER MEDICATION Take 2-3 capsules by mouth See admin instructions. Omega XL - take 3 capsules by mouth every morning and 2 capsules at night   Yes [provider]    Inpatient Medications: Scheduled Meds: . atorvastatin  40 mg Oral QHS  . Chlorhexidine Gluconate Cloth  6 each Topical Daily  . famotidine  20 mg Oral QHS  . furosemide  40 mg Intravenous Q6H  . isosorbide mononitrate  30 mg Oral Daily  . metoprolol tartrate  50 mg Oral BID  . sodium chloride flush  3 mL Intravenous Once  . sodium chloride flush  3 mL Intravenous Q12H   Continuous Infusions: . sodium chloride     PRN Meds: sodium chloride, acetaminophen, docusate sodium, hydrALAZINE, labetalol, ondansetron (ZOFRAN) IV, polyethylene glycol, sodium chloride flush  Allergies:   No Known Allergies  Social History:   Social History   Socioeconomic History  . Marital status: Widowed    Spouse name: Not on file  . Number of children: Not on file  . Years of education: Not on file  . Highest education level: Not on file  Occupational History  . Not on file  Tobacco Use  . Smoking status: Former Smoker    Packs/day: 1.00    Years: 30.00    Pack years: 30.00    Types: Cigarettes    Quit date: 01/27/2008    Years since quitting: 11.5  . Smokeless tobacco: Never Used  Vaping Use  . Vaping Use: Never used  Substance and Sexual Activity  . Alcohol use: No  . Drug use: No  . Sexual activity: Not on file  Other Topics Concern  . Not on file  Social History Narrative  . Not on file   Social Determinants of Health   Financial Resource Strain:   . Difficulty of Paying Living Expenses:   Food Insecurity:   . Worried About Charity fundraiser in the Last Year:   . Arboriculturist in the Last Year:   Transportation Needs:   . Film/video editor (Medical):   Marland Kitchen Lack of Transportation  (Non-Medical):   Physical Activity:   . Days of Exercise per Week:   . Minutes of Exercise per Session:   Stress:   . Feeling of Stress :   Social Connections:   . Frequency of Communication with  Friends and Family:   . Frequency of Social Gatherings with Friends and Family:   . Attends Religious Services:   . Active Member of Clubs or Organizations:   . Attends Archivist Meetings:   Marland Kitchen Marital Status:   Intimate Partner Violence:   . Fear of Current or Ex-Partner:   . Emotionally Abused:   Marland Kitchen Physically Abused:   . Sexually Abused:     Family History:   Family History  Problem Relation Age of Onset  . Heart disease Mother   . Heart disease Father    Family Status:  Family Status  Relation Name Status  . Mother  Deceased  . Father  Deceased  . MGM  Deceased  . MGF  Deceased  . PGM  Deceased  . PGF  Deceased    ROS:  Please see the history of present illness.  All other ROS reviewed and negative.     Physical Exam/Data:   Vitals:   08/08/19 0645 08/08/19 0700 08/08/19 0800 08/08/19 0803  BP: (!) 125/97 (!) 121/105  (!) 130/100  Pulse: 84 69    Resp: 19 17  20   Temp:   98.1 F (36.7 C)   TempSrc:   Oral   SpO2: 100% 99%    Weight:      Height:        Intake/Output Summary (Last 24 hours) at 08/08/2019 1048 Last data filed at 08/08/2019 0800 Gross per 24 hour  Intake 237.81 ml  Output 1300 ml  Net -1062.19 ml   Filed Weights   08/06/19 1636 08/07/19 1351 08/08/19 0250  Weight: 74.8 kg 76.2 kg 72.4 kg   Body mass index is 24.27 kg/m.   General: Elderly, NAD Skin: Warm, dry, intact  Neck: Negative for carotid bruits. No JVD Lungs: Diminished bilaterally.  No wheezes, rales, or rhonchi. Breathing is unlabored. Cardiovascular: Irregularly irregular with S1 S2. No murmurs Abdomen: Soft, non-tender, non-distended. No obvious abdominal masses. Extremities: 2-3+ BLE edema. Radial pulses 2+ bilaterally Neuro: Alert and oriented to self only.    Psych: Responds to questions somewhat appropriately with normal affect.    EKG:  The EKG was personally reviewed and demonstrates: 08/08/19 Atrial fibrillation with rate control at 79bpm  Telemetry:  Telemetry was personally reviewed and demonstrates: 08/08/19 AF with rates in the 70's   Relevant CV Studies:  Cath: 10/19/18   The left ventricular systolic function is normal.  LV end diastolic pressure is normal.  The left ventricular ejection fraction is 55-65% by visual estimate.  Prox RCA lesion is 30% stenosed.  Dist RCA lesion is 20% stenosed.  Ost Cx to Prox Cx lesion is 50% stenosed.  Prox LAD to Mid LAD lesion is 60% stenosed.  1st Diag lesion is 90% stenosed.  Dist LAD lesion is 30% stenosed.  2nd Mrg lesion is 90% stenosed.  1. Severe one-vessel coronary artery disease with subtotal thrombotic occlusion of the distal OM2 at the trifurcation of 3 terminal branches. The vessel in this area is 1.5 mm in diameter and at a trifurcation and thus not suitable for PCI. The vessel is also very tortuous. There is also moderate ostial left circumflex and proximal LAD disease. 2. Normal LV systolic function and left ventricular end-diastolic pressure.  Recommendations: Recommend aggressive medical therapy. Eliquis can be resumed tonight. I favor that he continues to take an antiplatelet medication given this thrombotic event. He seems to be already on Plavix which should be continued."  Echocardiogram 08/07/19:  1.  Left ventricular ejection fraction by 3D volume is 21 %. The left  ventricle has severely decreased function. The left ventricle demonstrates  regional wall motion abnormalities (see scoring diagram/findings for  description). There is mild left  ventricular hypertrophy. Left ventricular diastolic parameters are  indeterminate.  2. Right ventricular systolic function is moderately reduced. The right  ventricular size is normal. There is mildly elevated  pulmonary artery  systolic pressure. The estimated right ventricular systolic pressure is  93.2 mmHg.  3. Left atrial size was moderately dilated.  4. Right atrial size was moderately dilated.  5. The mitral valve is degenerative. Mild mitral valve regurgitation. No  evidence of mitral stenosis.  6. The aortic valve is abnormal. Aortic valve regurgitation is trivial.  No aortic stenosis is present.  7. The inferior vena cava is dilated in size with <50% respiratory  variability, suggesting right atrial pressure of 15 mmHg.   Comparison(s): A prior study was performed on 10/20/18. Prior images  reviewed side by side. LVEF has decreased.   Laboratory Data:  Chemistry Recent Labs  Lab 08/06/19 1642 08/07/19 0449  NA 142 141  K 3.4* 3.6  CL 106 100  CO2 25 27  GLUCOSE 123* 109*  BUN 25* 20  CREATININE 1.43* 1.25*  CALCIUM 8.7* 9.5  GFRNONAA 44* 52*  GFRAA 51* >60  ANIONGAP 11 14    Total Protein  Date Value Ref Range Status  08/06/2019 6.8 6.5 - 8.1 g/dL Final   Albumin  Date Value Ref Range Status  08/06/2019 3.4 (L) 3.5 - 5.0 g/dL Final   AST  Date Value Ref Range Status  08/06/2019 53 (H) 15 - 41 U/L Final   ALT  Date Value Ref Range Status  08/06/2019 65 (H) 0 - 44 U/L Final   Alkaline Phosphatase  Date Value Ref Range Status  08/06/2019 89 38 - 126 U/L Final   Total Bilirubin  Date Value Ref Range Status  08/06/2019 1.5 (H) 0.3 - 1.2 mg/dL Final   Hematology Recent Labs  Lab 08/06/19 1642  WBC 6.0  RBC 4.28  HGB 12.9*  HCT 39.2  MCV 91.6  MCH 30.1  MCHC 32.9  RDW 14.9  PLT 183   Cardiac EnzymesNo results for input(s): TROPONINI in the last 168 hours. No results for input(s): TROPIPOC in the last 168 hours.  BNP Recent Labs  Lab 08/06/19 1642  BNP 1,349.7*    DDimer No results for input(s): DDIMER in the last 168 hours. TSH:  Lab Results  Component Value Date   TSH 1.630 11/01/2018   Lipids: Lab Results  Component Value Date     CHOL 117 02/14/2018   HDL 45 02/14/2018   LDLCALC 62 02/14/2018   TRIG 48 02/14/2018   CHOLHDL 2.6 02/14/2018   HgbA1c: Lab Results  Component Value Date   HGBA1C 5.8 (H) 02/14/2018    Radiology/Studies:  CT HEAD WO CONTRAST  Result Date: 08/07/2019 CLINICAL DATA:  Stroke.  Follow-up intracranial hemorrhage. EXAM: CT HEAD WITHOUT CONTRAST TECHNIQUE: Contiguous axial images were obtained from the base of the skull through the vertex without intravenous contrast. COMPARISON:  CT head without contrast 08/07/2019 at 6:10 p.m. FINDINGS: Brain: Right thalamic hemorrhage is stable in size at 10 mm. Surrounding edema is stable. No new hemorrhage is present. Moderate atrophy and white matter changes are stable. Cortical infarct of the right parietal lobe is stable. No new infarcts are present. Basal ganglia are otherwise within normal limits. Remote infarct of the  anterior right insular cortex is again noted. Remote infarct of the medial right occipital lobe is again noted. Vascular: No hyperdense vessel or unexpected calcification. Skull: Calvarium is intact. No focal lytic or blastic lesions are present. No significant extracranial soft tissue lesion is present. Sinuses/Orbits: The paranasal sinuses and mastoid air cells are clear. Bilateral lens replacements are noted. Globes and orbits are otherwise unremarkable. IMPRESSION: 1. Stable 10 mm right thalamic hemorrhage with surrounding edema. 2. Stable atrophy and white matter disease. 3. Stable remote infarcts of the right parietal lobe, anterior right insular cortex, and medial right occipital lobe. Electronically Signed   By: San Morelle M.D.   On: 08/07/2019 22:49   CT HEAD WO CONTRAST  Result Date: 08/07/2019 CLINICAL DATA:  Mental status change, confusion EXAM: CT HEAD WITHOUT CONTRAST TECHNIQUE: Contiguous axial images were obtained from the base of the skull through the vertex without intravenous contrast. COMPARISON:  CT head 02/13/2018  FINDINGS: Brain: 10 mm hyperdensity right thalamus is new since the prior study most consistent with acute hemorrhage. No significant mass-effect or surrounding edema. Ventricle size normal. Patchy white matter hypodensity bilaterally is chronic and unchanged. Chronic infarct right occipital lobe was not present previously. Negative for acute infarct. Vascular: Negative for hyperdense vessel Skull: Negative Sinuses/Orbits: Negative Other: None IMPRESSION: 10 mm hyperdensity right thalamus compatible with acute hemorrhage. Atrophy and chronic ischemic change. Chronic infarct right occipital lobe. These results were called by telephone at the time of interpretation on 08/07/2019 at 6:45 pm to provider [facility] , who verbally acknowledged these results. Electronically Signed   By: Franchot Gallo M.D.   On: 08/07/2019 18:43   CT Angio Chest PE W/Cm &/Or Wo Cm  Result Date: 08/06/2019 CLINICAL DATA:  Fatigue for 2- 3 weeks recent fall EXAM: CT ANGIOGRAPHY CHEST WITH CONTRAST TECHNIQUE: Multidetector CT imaging of the chest was performed using the standard protocol during bolus administration of intravenous contrast. Multiplanar CT image reconstructions and MIPs were obtained to evaluate the vascular anatomy. CONTRAST:  123mL OMNIPAQUE IOHEXOL 350 MG/ML SOLN COMPARISON:  None. FINDINGS: Cardiovascular: There is a optimal opacification of the pulmonary arteries. There is equivocal partial non opacification of a small subsegmental posterior right lower lobe pulmonary artery best seen on series 3, image 175. No segmental or central pulmonary embolism is seen. No left-sided filling defects are noted. There is mild cardiomegaly. Coronary artery calcifications are seen. No pericardial effusion or thickening. No evidence right heart strain. There is normal three-vessel brachiocephalic anatomy without proximal stenosis. Scattered aortic atherosclerosis is noted. Mediastinum/Nodes: No hilar, mediastinal, or axillary  adenopathy. Thyroid gland, trachea, and esophagus demonstrate no significant findings. Lungs/Pleura: Small bilateral pleural effusions are present, right greater than left with hazy adjacent atelectasis. Musculoskeletal: There is a nondisplaced lateral right fourth rib fracture present. Review of the MIP images confirms the above findings. Abdomen/pelvis: Hepatobiliary: Multiple low-density lesions are seen throughout the liver parenchyma the largest within the right liver lobe measuring 3.6 cm. Main portal vein is patent. Partially calcified gallstones are present. No intrahepatic biliary ductal dilatation. Evidence of calcified gallstones, gallbladder wall thickening or biliary dilatation. Pancreas: Unremarkable. No pancreatic ductal dilatation or surrounding inflammatory changes. Spleen: Normal in size without focal abnormality. Adrenals/Urinary Tract: Both adrenal glands appear normal. Bilateral low-density lesions are seen within both kidneys the largest partially exophytic off the upper pole of the left kidney measuring 7.8 cm. No renal or collecting system calculi. No hydronephrosis. The bladder is grossly unremarkable. Stomach/Bowel: The stomach, small bowel, and  colon are normal in appearance. No inflammatory changes, wall thickening, or obstructive findings.Scattered colonic diverticula are noted. Vascular/Lymphatic: There are no enlarged mesenteric, retroperitoneal, or pelvic lymph nodes. Scattered aortic atherosclerotic calcifications are seen without aneurysmal dilatation. Reproductive: Suboptimally visualized due to overlying streak artifact. Other: No evidence of abdominal wall mass or hernia. Musculoskeletal: No acute or significant osseous findings. Bilateral total hip arthroplasties are present. Degenerative changes are seen in the thoracolumbar spine. IMPRESSION: 1. Equivocal tiny filling defect in the posterior right lower lobe subsegmental pulmonary artery which could represent a small pulmonary  embolism versus non opacification. No large or central pulmonary embolism. 2. Small bilateral pleural effusions and dependent atelectasis 3. No acute intra-abdominal or pelvic pathology to explain the patient's symptoms 4. Nondisplaced lateral right fourth rib fracture 5.  Aortic Atherosclerosis (ICD10-I70.0). Electronically Signed   By: Prudencio Pair M.D.   On: 08/06/2019 21:21   CT Abdomen Pelvis W Contrast  Result Date: 08/06/2019 CLINICAL DATA:  Fatigue for 2- 3 weeks recent fall EXAM: CT ANGIOGRAPHY CHEST WITH CONTRAST TECHNIQUE: Multidetector CT imaging of the chest was performed using the standard protocol during bolus administration of intravenous contrast. Multiplanar CT image reconstructions and MIPs were obtained to evaluate the vascular anatomy. CONTRAST:  145mL OMNIPAQUE IOHEXOL 350 MG/ML SOLN COMPARISON:  None. FINDINGS: Cardiovascular: There is a optimal opacification of the pulmonary arteries. There is equivocal partial non opacification of a small subsegmental posterior right lower lobe pulmonary artery best seen on series 3, image 175. No segmental or central pulmonary embolism is seen. No left-sided filling defects are noted. There is mild cardiomegaly. Coronary artery calcifications are seen. No pericardial effusion or thickening. No evidence right heart strain. There is normal three-vessel brachiocephalic anatomy without proximal stenosis. Scattered aortic atherosclerosis is noted. Mediastinum/Nodes: No hilar, mediastinal, or axillary adenopathy. Thyroid gland, trachea, and esophagus demonstrate no significant findings. Lungs/Pleura: Small bilateral pleural effusions are present, right greater than left with hazy adjacent atelectasis. Musculoskeletal: There is a nondisplaced lateral right fourth rib fracture present. Review of the MIP images confirms the above findings. Abdomen/pelvis: Hepatobiliary: Multiple low-density lesions are seen throughout the liver parenchyma the largest within the  right liver lobe measuring 3.6 cm. Main portal vein is patent. Partially calcified gallstones are present. No intrahepatic biliary ductal dilatation. Evidence of calcified gallstones, gallbladder wall thickening or biliary dilatation. Pancreas: Unremarkable. No pancreatic ductal dilatation or surrounding inflammatory changes. Spleen: Normal in size without focal abnormality. Adrenals/Urinary Tract: Both adrenal glands appear normal. Bilateral low-density lesions are seen within both kidneys the largest partially exophytic off the upper pole of the left kidney measuring 7.8 cm. No renal or collecting system calculi. No hydronephrosis. The bladder is grossly unremarkable. Stomach/Bowel: The stomach, small bowel, and colon are normal in appearance. No inflammatory changes, wall thickening, or obstructive findings.Scattered colonic diverticula are noted. Vascular/Lymphatic: There are no enlarged mesenteric, retroperitoneal, or pelvic lymph nodes. Scattered aortic atherosclerotic calcifications are seen without aneurysmal dilatation. Reproductive: Suboptimally visualized due to overlying streak artifact. Other: No evidence of abdominal wall mass or hernia. Musculoskeletal: No acute or significant osseous findings. Bilateral total hip arthroplasties are present. Degenerative changes are seen in the thoracolumbar spine. IMPRESSION: 1. Equivocal tiny filling defect in the posterior right lower lobe subsegmental pulmonary artery which could represent a small pulmonary embolism versus non opacification. No large or central pulmonary embolism. 2. Small bilateral pleural effusions and dependent atelectasis 3. No acute intra-abdominal or pelvic pathology to explain the patient's symptoms 4. Nondisplaced lateral right fourth rib  fracture 5.  Aortic Atherosclerosis (ICD10-I70.0). Electronically Signed   By: Prudencio Pair M.D.   On: 08/06/2019 21:21   DG Chest Port 1 View  Result Date: 08/06/2019 CLINICAL DATA:  Fatigue EXAM:  PORTABLE CHEST 1 VIEW COMPARISON:  April 13, 2019 FINDINGS: There is unchanged cardiomegaly. Aortic knob calcifications are seen. Trace right pleural effusion is seen. There is mild prominence of the central pulmonary vasculature. Overlying loop recorder is seen. No acute osseous abnormality. IMPRESSION: Mild pulmonary vascular congestion and trace right pleural effusion. Electronically Signed   By: Prudencio Pair M.D.   On: 08/06/2019 20:00   ECHOCARDIOGRAM COMPLETE  Result Date: 08/07/2019    ECHOCARDIOGRAM REPORT   Patient Name:   ROMEL DUMOND Date of Exam: 08/07/2019 Medical Rec #:  [id number]        Height:       68.0 in Accession #:    [id number]       Weight:       165.0 lb Date of Birth:  Dec 30, 1932        BSA:          1.883 m Patient Age:    17 years         BP:           142/101 mmHg Patient Gender: M                HR:           85 bpm. Exam Location:  Inpatient Procedure: 2D Echo, 3D Echo, Color Doppler and Cardiac Doppler Indications:    P91.50 Acute diastolic (congestive) heart failure  History:        Patient has prior history of Echocardiogram examinations, most                 recent 10/20/2018. CHF, CAD, Pacemaker, Arrythmias:Atrial                 Fibrillation; Risk Factors:Hypertension and Sleep Apnea. COVID+                 on 11/07/18.  Sonographer:    Raquel Sarna Senior RDCS Referring Phys: [id number] [place] T TU  Sonographer Comments: Windows limited by small rib spacing. IMPRESSIONS  1. Left ventricular ejection fraction by 3D volume is 21 %. The left ventricle has severely decreased function. The left ventricle demonstrates regional wall motion abnormalities (see scoring diagram/findings for description). There is mild left ventricular hypertrophy. Left ventricular diastolic parameters are indeterminate.  2. Right ventricular systolic function is moderately reduced. The right ventricular size is normal. There is mildly elevated pulmonary artery systolic pressure. The estimated right ventricular  systolic pressure is 01.6 mmHg.  3. Left atrial size was moderately dilated.  4. Right atrial size was moderately dilated.  5. The mitral valve is degenerative. Mild mitral valve regurgitation. No evidence of mitral stenosis.  6. The aortic valve is abnormal. Aortic valve regurgitation is trivial. No aortic stenosis is present.  7. The inferior vena cava is dilated in size with <50% respiratory variability, suggesting right atrial pressure of 15 mmHg. Comparison(s): A prior study was performed on 10/20/18. Prior images reviewed side by side. LVEF has decreased. FINDINGS  Left Ventricle: Left ventricular ejection fraction by 3D volume is 21 %. The left ventricle has severely decreased function. The left ventricle demonstrates regional wall motion abnormalities. The left ventricular internal cavity size was normal in size. There is mild left ventricular hypertrophy. Left ventricular diastolic parameters are indeterminate.  LV  Wall Scoring: The mid and distal lateral wall, mid anterolateral segment, mid inferoseptal segment, apical septal segment, mid anterior segment, and apical inferior segment are akinetic. The mid anteroseptal segment, basal anterolateral segment, apical anterior segment, mid inferior segment, and basal inferoseptal segment are hypokinetic. The basal anteroseptal segment is normal. Right Ventricle: The right ventricular size is normal. No increase in right ventricular wall thickness. Right ventricular systolic function is moderately reduced. There is mildly elevated pulmonary artery systolic pressure. The tricuspid regurgitant velocity is 2.57 m/s, and with an assumed right atrial pressure of 15 mmHg, the estimated right ventricular systolic pressure is 01.6 mmHg. Left Atrium: Left atrial size was moderately dilated. Right Atrium: Right atrial size was moderately dilated. Pericardium: There is no evidence of pericardial effusion. Mitral Valve: The mitral valve is degenerative in appearance. Mild  mitral valve regurgitation. No evidence of mitral valve stenosis. Tricuspid Valve: The tricuspid valve is normal in structure. Tricuspid valve regurgitation is trivial. Aortic Valve: The aortic valve is abnormal. Aortic valve regurgitation is trivial. No aortic stenosis is present. There is severe calcifcation of the aortic valve. Pulmonic Valve: The pulmonic valve was normal in structure. Pulmonic valve regurgitation is mild. Aorta: The aortic root is normal in size and structure. Venous: The inferior vena cava is dilated in size with less than 50% respiratory variability, suggesting right atrial pressure of 15 mmHg. IAS/Shunts: No atrial level shunt detected by color flow Doppler.  LEFT VENTRICLE PLAX 2D LVIDd:         4.30 cm LVIDs:         4.10 cm LV PW:         1.20 cm         3D Volume EF LV IVS:        1.30 cm         LV 3D EF:    Left LVOT diam:     2.00 cm                      ventricular LV SV:         32                           ejection LV SV Index:   17                           fraction by LVOT Area:     3.14 cm                     3D volume                                             is 21 %.                                 3D Volume EF:                                3D EF:        21 % RIGHT VENTRICLE RV S prime:     5.36 cm/s TAPSE (M-mode): 1.3 cm LEFT ATRIUM  Index       RIGHT ATRIUM           Index LA diam:        4.60 cm  2.44 cm/m  RA Area:     25.00 cm LA Vol (A2C):   112.0 ml 59.47 ml/m RA Volume:   80.80 ml  42.90 ml/m LA Vol (A4C):   65.8 ml  34.94 ml/m LA Biplane Vol: 86.6 ml  45.98 ml/m  AORTIC VALVE LVOT Vmax:   64.40 cm/s LVOT Vmean:  42.000 cm/s LVOT VTI:    0.102 m  AORTA Ao Root diam: 3.60 cm Ao Asc diam:  3.70 cm TRICUSPID VALVE TR Peak grad:   26.4 mmHg TR Vmax:        257.00 cm/s  SHUNTS Systemic VTI:  0.10 m Systemic Diam: 2.00 cm Cherlynn Kaiser MD Electronically signed by Cherlynn Kaiser MD Signature Date/Time: 08/07/2019/12:31:38 PM    Final    Assessment  and Plan:   1. Acute on chronic combined systolic and diastolic HF with further reduced LV function: -Pt presented to [facility] 08/06/19 with a 2-3 week hx of dyspnea, BLE and abdominal pain. CXR showed mild pulmonary congestion and right pleural effusion. Chest CTA with defect in posterior right lower lobe and non displaced lateral right 4th rib fx. He was started on diuresis for acute on chronic CHF. 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 Neurology was consulted and recommended transfer to [facility] 4 N for blood pressure control and close neurological monitoring. No reversal agents for Eliquis or Plavix per Neuro.He was transferred to [facility] on 08/08/19 with plans for cardiology consultation for CHF.  -Repeat echocardiogram from this admission with LVEF down to 21% from 45-50% on previous study.  -Placed on IV diuretics Lasix 40mg  Q6H x 3 doses>>would continue for now and reassess in AM>>>creatinine improving   -BNP 1347 from 08/06/19 -I&O, net negative 3.1L -Weight, 159lb with an admission weight at 165lb  -Given the presumed absence of chest pain (denies today and family reports he has had no complaints) likely nonischemic CM although has known single vessel CAD for which medical therapy was recommended given no clear targets for PCI or grafting. Not currently a candidate for further invasive workup given acute ICH. Would plan for continued medical management of CM. Lisinopril initiated at 10mg  PO QD>>>would prefer to use ARB as he may be a candidate for Entresto if BP and renal function remains stable   2. Acute ICH: -Presented to [facility] with a 2-3 week hx of dyspnea. 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 Neurology was consulted and recommended transfer to [facility] 4 N for blood pressure control and close  neurological monitoring. No reversal agents for Eliquis or Plavix per Neuro.He was transferred to [facility] on 08/08/19 with plans for cardiology consultation for CHF.  -Repeat brain MRI today with stable hemorrhage  -Management per neuro/PCCM   3. Paroxysmal atrial fibrillation: -Had LINQ device placed after prior CVA in 2020>>has known PAF on Eliquis. Last remote download 05/05/19 with stable battery, 143 episodes from prior download (05/03/19) -Rate controlled with metoprolol 50mg  PO QD  -Continue to hold AC until ok per neurology  -CHA2DS2VASc of at least 7 (age, CHF, HTN, CAD, CVA)  4. CAD: -Last LHC 10/2018 with  severe one-vessel coronary artery disease with subtotal thrombotic occlusion of the distal OM2 at the trifurcation of 3 terminal branches. -PTA Plavix as above and Eliquis>>plavix due to thrombotic region of vessel  -Not on ASA due to need to avoid triple therapy -AC/antiplatlet per neurology   5. HTN urgency: -Noted to be quite hypertensive on admission with SBP in the 180s -Current pressures stabilized to the 120's    Other medical issues per primary team: -AKI -Transaminitis -Closed rib fracture   For questions or updates, please contact [place] Please consult [email or web address] for contact info under Cardiology/STEMI.   Lyndel Safe NP-C [place] Pager: [phone] 08/08/2019 10:48 AM  Patient seen and examined. Agree with assessment and plan. Brian Brown is an 86 year old African-American gentleman who has history of known CAD with remote EF 45 to 50%, history of CVA felt secondary to PAF, hypertension, untreated sleep apnea, hyperlipidemia, prostate CA, as well as mild dilation of his ascending aorta. He has experienced recent increasing dyspnea and chest x-ray showed mild pulmonary congestion with displaced lateral right fourth rib fracture. He developed confusion on August 1 and was found to have 10 mm left thalamic ICH without mass-effect. Now on 4 N.  following transfer for the patient had been on Eliquis and Plavix which now have been discontinued. He was hypertensive yesterday with blood pressure increasing to 180/102. An echo Doppler study has now shown significant LV dysfunction with an EF at 21% and LAD wall motion abnormalities. He has continued to be on diuresis with furosemide with some improvement in his prior significant leg edema. Presently he is in no distress. Blood pressure is improved at 130/84. Pulse is irregular regular with rate controlled A. Fib. On exam he is in no distress. JVD approximated 7 cm. There was no wheezing or rales. Rhythm was irregular regular with rate control,  1/6 systolic murmur, no HJR, bowel sounds positive. Residual 2+ pretibial edema. Patient has had good diuresis since admission with INO -3.1 L and 6 pound weight loss. We will continue with furosemide 40 mg daily. With EF at 21%, favor initiating ARB therapy rather than ACE inhibition particularly with potential future transition to [facility]. The patient received 1 dose of lisinopril today, will start losartan initially at 25 mg tomorrow and uptitrate to 25 twice daily depending upon blood pressure. Also with significant LV dysfunction, will change from metoprolol tartrate to carvedilol and will initiate at 12.5 mg twice a day and uptitrate as blood pressure and heart rate allow. In light of his recent bleed, he is not a candidate for systemic anticoagulation. Eliquis and Plavix have been discontinued. He is not having any anginal symptoms. His echo Doppler study demonstrates wall motion abnormality in the LAD territory. Will follow reassess in a.m.  Troy Sine, MD, [facility] 08/08/2019 2:19 PM

## 2019-08-08 NOTE — Progress Notes (Signed)
Initial Nutrition Assessment  DOCUMENTATION CODES:   Severe malnutrition in context of chronic illness  INTERVENTION:   Encourage PO intake at meals; pt may benefit from diet liberalization if PO intake poor.   Ensure Enlive po TID, each supplement provides 350 kcal and 20 grams of protein  Magic cup TID with meals, each supplement provides 290 kcal and 9 grams of protein  MVI daily  Discussed importance of adequate nutrition to prevent further weight loss and improve malnutrition.   NUTRITION DIAGNOSIS:   Severe Malnutrition related to chronic illness as evidenced by severe fat depletion, severe muscle depletion, percent weight loss.  GOAL:   Patient will meet greater than or equal to 90% of their needs  MONITOR:   PO intake, Supplement acceptance  REASON FOR ASSESSMENT:   Consult Poor PO  ASSESSMENT:   Pt with PMH of CAD, PAF,  HTN, cardiomyopathy with new LVEF of 21% admitted with L thalamic ICH and acute systolic heart failure.    Pt discussed during ICU rounds and with RN.   Spoke with patient and his three daughters who are from out of town. Per pt he lives alone and started to lose weight prior to the pandemic when he had a stroke then a heart attack and then had COVID-19. He reports a dry weight of 195-200 lb. Pt is now 159 lb with moderate edema on BLE. This indicates at least an 18% weight loss x 18 months. Per chart review pt with 14% weight loss since 11/2018 x 8 months.  Pt reports that he eats cereal for breakfast and has meals on wheels for lunch and usually skips supper. He knew he was losing weight and decided to drink some boost but does not do this routinely.     Medications reviewed and include: lasix Labs reviewed     NUTRITION - FOCUSED PHYSICAL EXAM:    Most Recent Value  Orbital Region Severe depletion  Upper Arm Region Severe depletion  Thoracic and Lumbar Region Severe depletion  Buccal Region Severe depletion  Temple Region Severe  depletion  Clavicle Bone Region Severe depletion  Clavicle and Acromion Bone Region Severe depletion  Scapular Bone Region Severe depletion  Dorsal Hand Severe depletion  Patellar Region Moderate depletion  Anterior Thigh Region Severe depletion  Posterior Calf Region Unable to assess  [edema]  Edema (RD Assessment) Moderate  [BLE]  Hair Reviewed  Eyes Reviewed  Mouth Reviewed  Skin Reviewed  Nails Reviewed       Diet Order:   Diet Order            Diet Heart Room service appropriate? Yes with Assist; Fluid consistency: Thin  Diet effective now                 EDUCATION NEEDS:   Education needs have been addressed  Skin:  Skin Assessment: Reviewed RN Assessment  Last BM:  unknown  Height:   Ht Readings from Last 1 Encounters:  08/08/19 5\' 8"  (1.727 m)    Weight:   Wt Readings from Last 1 Encounters:  08/08/19 72.4 kg    Ideal Body Weight:  (P) 70 kg  BMI:  Body mass index is 24.27 kg/m.  Estimated Nutritional Needs:   Kcal:  2100-2300  Protein:  100-115 grams  Fluid:  2 L/day  Lockie Pares., RD, LDN, CNSC See AMiON for contact information

## 2019-08-08 NOTE — Progress Notes (Signed)
CCM notified regarding two consecutive SBP<85. Pt neurologically the same. Will hold the 1700 Coreg and continue to assess. MD still okay to transfer pt.

## 2019-08-08 NOTE — ED Notes (Signed)
[facility] contacted for transport to [facility]

## 2019-08-08 NOTE — Consult Note (Signed)
Requesting Physician: Dr. Shanda Howells    Chief Complaint: Confusion  History obtained from: Patient and Chart    HPI:                                                                                                                                       Brian Brown is a 86 y.o. male with past medical history significant for CVA with right-sided weakness, paroxysmal atrial fibrillation on Eliquis, hypertension, coronary artery disease, CHF, HCV presented with 2 to 3 weeks of fatigue, increased lower extremity edema and abdominal pain admitted on 8/1 for acute systolic heart failure.  On the morning of 8/2 patient noted to be confused and CT head was completed around 6:40 PM which showed a 10 mm left thalamic ICH. Neurology was consulted. Blood pressure initially elevated and recommended improved blood pressure control less than 140/90 mmHg and transferred to [facility], ICU for monitoring and repeat CT head.  Patient denies any symptoms.  Appears confused and not oriented to place or month  Date last known well: 8/2/1 tPA Given: no, hemorrhage NIHSS: 2 Baseline MRS 1  Intracerebral Hemorrhage (ICH) Score  Glascow Coma Score  13-15 0  Age >/= 80 yes +1  ICH volume >/= 84ml no 0  IVH no 0  Infratentorial origin no 0 Total:  1   Past Medical History:  Diagnosis Date  . Arthritis    degenerative joint disease  . Bleeding stomach ulcer 25 yrs ago  . CAD (coronary artery disease)    a. NSTEMI 10/2018 thrombotic lesion to OM2, 90% D1 (medical therapy), otherwise nonobstructive disease.  . Cancer [facility]) 2002   prostate, s/p surgery  . Cardiomyopathy ([place])    a. EF 45-50 by echo 10/2018.  Marland Kitchen CKD (chronic kidney disease), stage II   . Hepatitis 1981   does not know what kind;denies jaundice  . Hypertension    treated by Dr Delfina Redwood, primary MD  . Mild dilation of ascending aorta ([place])   . PAF (paroxysmal atrial fibrillation) ([place])   . Sleep apnea 2005   does not wear a Cpap  .  Stroke ([place]) 2003   w/ Rt sided weakness-some-rt leg  . Wears dentures    full top  . Wears glasses     Past Surgical History:  Procedure Laterality Date  . BUBBLE STUDY  09/16/2018   Procedure: BUBBLE STUDY;  Surgeon: Lelon Perla, MD;  Location: [place];  Service: Cardiovascular;;  . CARDIAC CATHETERIZATION  10/19/2018  . CARPAL TUNNEL RELEASE Left 03/29/2013   Procedure: LEFT CARPAL TUNNEL RELEASE;  Surgeon: Wynonia Sours, MD;  Location: [place];  Service: Orthopedics;  Laterality: Left;  ANESTHESIA: IV REGIONAL FAB  . CARPAL TUNNEL RELEASE Right 03/06/2014   Procedure: RIGHT CARPAL TUNNEL RELEASE;  Surgeon: Daryll Brod, MD;  Location: [place];  Service: Orthopedics;  Laterality: Right;  .  COLONOSCOPY    . HERNIA REPAIR  2009   rt inguinal hernia  . JOINT REPLACEMENT  12/2001   rt hip ;[place] CATH AND CORONARY ANGIOGRAPHY N/A 10/19/2018   Procedure: LEFT HEART CATH AND CORONARY ANGIOGRAPHY;  Surgeon: Wellington Hampshire, MD;  Location: [place] CV LAB;  Service: Cardiovascular;  Laterality: N/A;  . LOOP RECORDER INSERTION N/A 09/16/2018   Procedure: LOOP RECORDER INSERTION;  Surgeon: Constance Haw, MD;  Location: [place] CV LAB;  Service: Cardiovascular;  Laterality: N/A;  . PROSTATECTOMY  2002  . SHOULDER ARTHROSCOPY W/ ROTATOR CUFF REPAIR  2014   lt-dr whitfield  . TEE WITHOUT CARDIOVERSION N/A 09/16/2018   Procedure: TRANSESOPHAGEAL ECHOCARDIOGRAM (TEE);  Surgeon: Lelon Perla, MD;  Location: [facility] ENDOSCOPY;  Service: Cardiovascular;  Laterality: N/A;  . TOTAL HIP ARTHROPLASTY  02/03/2011   Procedure: TOTAL HIP ARTHROPLASTY;  Surgeon: Garald Balding, MD;  Location: [place];  Service: Orthopedics;  Laterality: Left;  . TRIGGER FINGER RELEASE Left 03/29/2013   Procedure: LEFT MIDDLE/SMALL FINGER RELEASE A-1 PULLEY AND LEFT RING FINGER;  Surgeon: Wynonia Sours, MD;  Location: [place];   Service: Orthopedics;  Laterality: Left;  . TRIGGER FINGER RELEASE Right 03/06/2014   Procedure: RIGHT MIDDLE FINGER,RIGHT RINGER FINGER;  Surgeon: Daryll Brod, MD;  Location: [place];  Service: Orthopedics;  Laterality: Right;    Family History  Problem Relation Age of Onset  . Heart disease Mother   . Heart disease Father    Social History:  reports that he quit smoking about 11 years ago. His smoking use included cigarettes. He has a 30.00 pack-year smoking history. He has never used smokeless tobacco. He reports that he does not drink alcohol and does not use drugs.  Allergies: No Known Allergies  Medications:                                                                                                                        I reviewed home medications   ROS:                                                                                                                                     14 systems reviewed and negative except above    Examination:  General: Appears well-developed  Psych: Affect appropriate to situation Eyes: No scleral injection HENT: No OP obstrucion Head: Normocephalic.  Cardiovascular: Irregularly irregular Respiratory: Effort normal and breath sounds normal to anterior ascultation GI: Soft.  No distension. There is no tenderness.  Skin: WDI    Neurological Examination Mental Status: Alert, oriented to himself but not month or year or place.  Could answer questions or remote memory.  Speech fluent without evidence of aphasia. Able to follow simple commands without difficulty. Cranial Nerves: II: Visual fields grossly normal,  III,IV, VI: ptosis not present, extra-ocular motions intact bilaterally, pupils equal, round, reactive to light and accommodation V,VII: smile symmetric, facial light touch sensation normal bilaterally VIII:  hearing normal bilaterally IX,X: uvula rises symmetrically XI: bilateral shoulder shrug XII: midline tongue extension Motor: Right : Upper extremity   5/5    Left:     Upper extremity   5/5  Lower extremity   5/5     Lower extremity   5/5 Tone and bulk:normal tone throughout; no atrophy noted Sensory: Pinprick and light touch intact throughout, bilaterally Deep Tendon Reflexes: 2+ and symmetric throughout Plantars: Right: downgoing   Left: downgoing Cerebellar: normal finger-to-nose, normal rapid alternating movements and normal heel-to-shin test      Lab Results: Basic Metabolic Panel: Recent Labs  Lab 08/06/19 1642 08/07/19 0449  NA 142 141  K 3.4* 3.6  CL 106 100  CO2 25 27  GLUCOSE 123* 109*  BUN 25* 20  CREATININE 1.43* 1.25*  CALCIUM 8.7* 9.5    CBC: Recent Labs  Lab 08/06/19 1642  WBC 6.0  HGB 12.9*  HCT 39.2  MCV 91.6  PLT 183    Coagulation Studies: No results for input(s): LABPROT, INR in the last 72 hours.  Imaging: CT HEAD WO CONTRAST  Result Date: 08/07/2019 CLINICAL DATA:  Stroke.  Follow-up intracranial hemorrhage. EXAM: CT HEAD WITHOUT CONTRAST TECHNIQUE: Contiguous axial images were obtained from the base of the skull through the vertex without intravenous contrast. COMPARISON:  CT head without contrast 08/07/2019 at 6:10 p.m. FINDINGS: Brain: Right thalamic hemorrhage is stable in size at 10 mm. Surrounding edema is stable. No new hemorrhage is present. Moderate atrophy and white matter changes are stable. Cortical infarct of the right parietal lobe is stable. No new infarcts are present. Basal ganglia are otherwise within normal limits. Remote infarct of the anterior right insular cortex is again noted. Remote infarct of the medial right occipital lobe is again noted. Vascular: No hyperdense vessel or unexpected calcification. Skull: Calvarium is intact. No focal lytic or blastic lesions are present. No significant extracranial soft tissue lesion  is present. Sinuses/Orbits: The paranasal sinuses and mastoid air cells are clear. Bilateral lens replacements are noted. Globes and orbits are otherwise unremarkable. IMPRESSION: 1. Stable 10 mm right thalamic hemorrhage with surrounding edema. 2. Stable atrophy and white matter disease. 3. Stable remote infarcts of the right parietal lobe, anterior right insular cortex, and medial right occipital lobe. Electronically Signed   By: San Morelle M.D.   On: 08/07/2019 22:49   CT HEAD WO CONTRAST  Result Date: 08/07/2019 CLINICAL DATA:  Mental status change, confusion EXAM: CT HEAD WITHOUT CONTRAST TECHNIQUE: Contiguous axial images were obtained from the base of the skull through the vertex without intravenous contrast. COMPARISON:  CT head 02/13/2018 FINDINGS: Brain: 10 mm hyperdensity right thalamus is new since the prior study most consistent with acute hemorrhage. No significant mass-effect or surrounding edema. Ventricle size normal. Patchy white matter  hypodensity bilaterally is chronic and unchanged. Chronic infarct right occipital lobe was not present previously. Negative for acute infarct. Vascular: Negative for hyperdense vessel Skull: Negative Sinuses/Orbits: Negative Other: None IMPRESSION: 10 mm hyperdensity right thalamus compatible with acute hemorrhage. Atrophy and chronic ischemic change. Chronic infarct right occipital lobe. These results were called by telephone at the time of interpretation on 08/07/2019 at 6:45 pm to provider [facility] , who verbally acknowledged these results. Electronically Signed   By: Franchot Gallo M.D.   On: 08/07/2019 18:43   CT Angio Chest PE W/Cm &/Or Wo Cm  Result Date: 08/06/2019 CLINICAL DATA:  Fatigue for 2- 3 weeks recent fall EXAM: CT ANGIOGRAPHY CHEST WITH CONTRAST TECHNIQUE: Multidetector CT imaging of the chest was performed using the standard protocol during bolus administration of intravenous contrast. Multiplanar CT image reconstructions and  MIPs were obtained to evaluate the vascular anatomy. CONTRAST:  147mL OMNIPAQUE IOHEXOL 350 MG/ML SOLN COMPARISON:  None. FINDINGS: Cardiovascular: There is a optimal opacification of the pulmonary arteries. There is equivocal partial non opacification of a small subsegmental posterior right lower lobe pulmonary artery best seen on series 3, image 175. No segmental or central pulmonary embolism is seen. No left-sided filling defects are noted. There is mild cardiomegaly. Coronary artery calcifications are seen. No pericardial effusion or thickening. No evidence right heart strain. There is normal three-vessel brachiocephalic anatomy without proximal stenosis. Scattered aortic atherosclerosis is noted. Mediastinum/Nodes: No hilar, mediastinal, or axillary adenopathy. Thyroid gland, trachea, and esophagus demonstrate no significant findings. Lungs/Pleura: Small bilateral pleural effusions are present, right greater than left with hazy adjacent atelectasis. Musculoskeletal: There is a nondisplaced lateral right fourth rib fracture present. Review of the MIP images confirms the above findings. Abdomen/pelvis: Hepatobiliary: Multiple low-density lesions are seen throughout the liver parenchyma the largest within the right liver lobe measuring 3.6 cm. Main portal vein is patent. Partially calcified gallstones are present. No intrahepatic biliary ductal dilatation. Evidence of calcified gallstones, gallbladder wall thickening or biliary dilatation. Pancreas: Unremarkable. No pancreatic ductal dilatation or surrounding inflammatory changes. Spleen: Normal in size without focal abnormality. Adrenals/Urinary Tract: Both adrenal glands appear normal. Bilateral low-density lesions are seen within both kidneys the largest partially exophytic off the upper pole of the left kidney measuring 7.8 cm. No renal or collecting system calculi. No hydronephrosis. The bladder is grossly unremarkable. Stomach/Bowel: The stomach, small  bowel, and colon are normal in appearance. No inflammatory changes, wall thickening, or obstructive findings.Scattered colonic diverticula are noted. Vascular/Lymphatic: There are no enlarged mesenteric, retroperitoneal, or pelvic lymph nodes. Scattered aortic atherosclerotic calcifications are seen without aneurysmal dilatation. Reproductive: Suboptimally visualized due to overlying streak artifact. Other: No evidence of abdominal wall mass or hernia. Musculoskeletal: No acute or significant osseous findings. Bilateral total hip arthroplasties are present. Degenerative changes are seen in the thoracolumbar spine. IMPRESSION: 1. Equivocal tiny filling defect in the posterior right lower lobe subsegmental pulmonary artery which could represent a small pulmonary embolism versus non opacification. No large or central pulmonary embolism. 2. Small bilateral pleural effusions and dependent atelectasis 3. No acute intra-abdominal or pelvic pathology to explain the patient's symptoms 4. Nondisplaced lateral right fourth rib fracture 5.  Aortic Atherosclerosis (ICD10-I70.0). Electronically Signed   By: Prudencio Pair M.D.   On: 08/06/2019 21:21   CT Abdomen Pelvis W Contrast  Result Date: 08/06/2019 CLINICAL DATA:  Fatigue for 2- 3 weeks recent fall EXAM: CT ANGIOGRAPHY CHEST WITH CONTRAST TECHNIQUE: Multidetector CT imaging of the chest was performed using the standard  protocol during bolus administration of intravenous contrast. Multiplanar CT image reconstructions and MIPs were obtained to evaluate the vascular anatomy. CONTRAST:  131mL OMNIPAQUE IOHEXOL 350 MG/ML SOLN COMPARISON:  None. FINDINGS: Cardiovascular: There is a optimal opacification of the pulmonary arteries. There is equivocal partial non opacification of a small subsegmental posterior right lower lobe pulmonary artery best seen on series 3, image 175. No segmental or central pulmonary embolism is seen. No left-sided filling defects are noted. There is mild  cardiomegaly. Coronary artery calcifications are seen. No pericardial effusion or thickening. No evidence right heart strain. There is normal three-vessel brachiocephalic anatomy without proximal stenosis. Scattered aortic atherosclerosis is noted. Mediastinum/Nodes: No hilar, mediastinal, or axillary adenopathy. Thyroid gland, trachea, and esophagus demonstrate no significant findings. Lungs/Pleura: Small bilateral pleural effusions are present, right greater than left with hazy adjacent atelectasis. Musculoskeletal: There is a nondisplaced lateral right fourth rib fracture present. Review of the MIP images confirms the above findings. Abdomen/pelvis: Hepatobiliary: Multiple low-density lesions are seen throughout the liver parenchyma the largest within the right liver lobe measuring 3.6 cm. Main portal vein is patent. Partially calcified gallstones are present. No intrahepatic biliary ductal dilatation. Evidence of calcified gallstones, gallbladder wall thickening or biliary dilatation. Pancreas: Unremarkable. No pancreatic ductal dilatation or surrounding inflammatory changes. Spleen: Normal in size without focal abnormality. Adrenals/Urinary Tract: Both adrenal glands appear normal. Bilateral low-density lesions are seen within both kidneys the largest partially exophytic off the upper pole of the left kidney measuring 7.8 cm. No renal or collecting system calculi. No hydronephrosis. The bladder is grossly unremarkable. Stomach/Bowel: The stomach, small bowel, and colon are normal in appearance. No inflammatory changes, wall thickening, or obstructive findings.Scattered colonic diverticula are noted. Vascular/Lymphatic: There are no enlarged mesenteric, retroperitoneal, or pelvic lymph nodes. Scattered aortic atherosclerotic calcifications are seen without aneurysmal dilatation. Reproductive: Suboptimally visualized due to overlying streak artifact. Other: No evidence of abdominal wall mass or hernia.  Musculoskeletal: No acute or significant osseous findings. Bilateral total hip arthroplasties are present. Degenerative changes are seen in the thoracolumbar spine. IMPRESSION: 1. Equivocal tiny filling defect in the posterior right lower lobe subsegmental pulmonary artery which could represent a small pulmonary embolism versus non opacification. No large or central pulmonary embolism. 2. Small bilateral pleural effusions and dependent atelectasis 3. No acute intra-abdominal or pelvic pathology to explain the patient's symptoms 4. Nondisplaced lateral right fourth rib fracture 5.  Aortic Atherosclerosis (ICD10-I70.0). Electronically Signed   By: Prudencio Pair M.D.   On: 08/06/2019 21:21   DG Chest Port 1 View  Result Date: 08/06/2019 CLINICAL DATA:  Fatigue EXAM: PORTABLE CHEST 1 VIEW COMPARISON:  April 13, 2019 FINDINGS: There is unchanged cardiomegaly. Aortic knob calcifications are seen. Trace right pleural effusion is seen. There is mild prominence of the central pulmonary vasculature. Overlying loop recorder is seen. No acute osseous abnormality. IMPRESSION: Mild pulmonary vascular congestion and trace right pleural effusion. Electronically Signed   By: Prudencio Pair M.D.   On: 08/06/2019 20:00   ECHOCARDIOGRAM COMPLETE  Result Date: 08/07/2019    ECHOCARDIOGRAM REPORT   Patient Name:   DAMARRI RAMPY Date of Exam: 08/07/2019 Medical Rec #:  [id number]        Height:       68.0 in Accession #:    [id number]       Weight:       165.0 lb Date of Birth:  1932-04-28        BSA:  1.883 m Patient Age:    74 years         BP:           142/101 mmHg Patient Gender: M                HR:           85 bpm. Exam Location:  Inpatient Procedure: 2D Echo, 3D Echo, Color Doppler and Cardiac Doppler Indications:    P53.61 Acute diastolic (congestive) heart failure  History:        Patient has prior history of Echocardiogram examinations, most                 recent 10/20/2018. CHF, CAD, Pacemaker, Arrythmias:Atrial                  Fibrillation; Risk Factors:Hypertension and Sleep Apnea. COVID+                 on 11/07/18.  Sonographer:    Raquel Sarna Senior RDCS Referring Phys: [id number] [place] T [facility]U  Sonographer Comments: Windows limited by small rib spacing. IMPRESSIONS  1. Left ventricular ejection fraction by 3D volume is 21 %. The left ventricle has severely decreased function. The left ventricle demonstrates regional wall motion abnormalities (see scoring diagram/findings for description). There is mild left ventricular hypertrophy. Left ventricular diastolic parameters are indeterminate.  2. Right ventricular systolic function is moderately reduced. The right ventricular size is normal. There is mildly elevated pulmonary artery systolic pressure. The estimated right ventricular systolic pressure is 08.6 mmHg.  3. Left atrial size was moderately dilated.  4. Right atrial size was moderately dilated.  5. The mitral valve is degenerative. Mild mitral valve regurgitation. No evidence of mitral stenosis.  6. The aortic valve is abnormal. Aortic valve regurgitation is trivial. No aortic stenosis is present.  7. The inferior vena cava is dilated in size with <50% respiratory variability, suggesting right atrial pressure of 15 mmHg. Comparison(s): A prior study was performed on 10/20/18. Prior images reviewed side by side. LVEF has decreased. FINDINGS  Left Ventricle: Left ventricular ejection fraction by 3D volume is 21 %. The left ventricle has severely decreased function. The left ventricle demonstrates regional wall motion abnormalities. The left ventricular internal cavity size was normal in size. There is mild left ventricular hypertrophy. Left ventricular diastolic parameters are indeterminate.  LV Wall Scoring: The mid and distal lateral wall, mid anterolateral segment, mid inferoseptal segment, apical septal segment, mid anterior segment, and apical inferior segment are akinetic. The mid anteroseptal segment, basal anterolateral  segment, apical anterior segment, mid inferior segment, and basal inferoseptal segment are hypokinetic. The basal anteroseptal segment is normal. Right Ventricle: The right ventricular size is normal. No increase in right ventricular wall thickness. Right ventricular systolic function is moderately reduced. There is mildly elevated pulmonary artery systolic pressure. The tricuspid regurgitant velocity is 2.57 m/s, and with an assumed right atrial pressure of 15 mmHg, the estimated right ventricular systolic pressure is 76.1 mmHg. Left Atrium: Left atrial size was moderately dilated. Right Atrium: Right atrial size was moderately dilated. Pericardium: There is no evidence of pericardial effusion. Mitral Valve: The mitral valve is degenerative in appearance. Mild mitral valve regurgitation. No evidence of mitral valve stenosis. Tricuspid Valve: The tricuspid valve is normal in structure. Tricuspid valve regurgitation is trivial. Aortic Valve: The aortic valve is abnormal. Aortic valve regurgitation is trivial. No aortic stenosis is present. There is severe calcifcation of the aortic valve. Pulmonic Valve: The pulmonic  valve was normal in structure. Pulmonic valve regurgitation is mild. Aorta: The aortic root is normal in size and structure. Venous: The inferior vena cava is dilated in size with less than 50% respiratory variability, suggesting right atrial pressure of 15 mmHg. IAS/Shunts: No atrial level shunt detected by color flow Doppler.  LEFT VENTRICLE PLAX 2D LVIDd:         4.30 cm LVIDs:         4.10 cm LV PW:         1.20 cm         3D Volume EF LV IVS:        1.30 cm         LV 3D EF:    Left LVOT diam:     2.00 cm                      ventricular LV SV:         32                           ejection LV SV Index:   17                           fraction by LVOT Area:     3.14 cm                     3D volume                                             is 21 %.                                 3D Volume EF:                                 3D EF:        21 % RIGHT VENTRICLE RV S prime:     5.36 cm/s TAPSE (M-mode): 1.3 cm LEFT ATRIUM              Index       RIGHT ATRIUM           Index LA diam:        4.60 cm  2.44 cm/m  RA Area:     25.00 cm LA Vol (A2C):   112.0 ml 59.47 ml/m RA Volume:   80.80 ml  42.90 ml/m LA Vol (A4C):   65.8 ml  34.94 ml/m LA Biplane Vol: 86.6 ml  45.98 ml/m  AORTIC VALVE LVOT Vmax:   64.40 cm/s LVOT Vmean:  42.000 cm/s LVOT VTI:    0.102 m  AORTA Ao Root diam: 3.60 cm Ao Asc diam:  3.70 cm TRICUSPID VALVE TR Peak grad:   26.4 mmHg TR Vmax:        257.00 cm/s  SHUNTS Systemic VTI:  0.10 m Systemic Diam: 2.00 cm Cherlynn Kaiser MD Electronically signed by Cherlynn Kaiser MD Signature Date/Time: 08/07/2019/12:31:38 PM    Final      ASSESSMENT AND PLAN  86 y.o. male with past medical history significant for CVA with right-sided weakness, paroxysmal atrial  fibrillation on Eliquis, hypertension admitted with acute systolic heart failure noted to be confused this morning and CT head showed a right thalamic hemorrhage. Recommended as needed labetalol for blood pressure control and close monitoring for 24 hours.  Repeat CT head stable.  No need for reversal of Eliquis.   Acute right thalamic hemorrhage  Etiology: Likely hemorrhage and being anticoagulated  #Hold Eliquis, Plavix, subcutaneous heparin/Lovenox #BP goal less than 140/90 mmHg # Telemetry monitoring # Frequent neuro checks # NPO until passes stroke swallow screen #Repeat CT head in 3 hours: No further expansion   This patient is neurologically critically ill due to [place].  He is at risk for significant risk of neurological worsening from cerebral edema,  death from brain herniation, heart failure,infection, respiratory failure and seizure. This patient's care requires constant monitoring of vital signs, hemodynamics, respiratory and cardiac monitoring, review of multiple databases, neurological assessment, discussion with  family, other specialists and medical decision making of high complexity.  I spent 45 minutes of neurocritical time in the care of this patient.      Please page stroke NP  Or  PA  Or MD from 8am -4 pm  as this patient from this time will be  followed by the stroke.   You can look them up on [email or web address]  Password [facility]   Jabir Dahlem [facility] Pager Number [phone]

## 2019-08-08 NOTE — Progress Notes (Signed)
eLink Physician-Brief Progress Note Patient Name: Brian Brown DOB: 1932-05-03 MRN: [id number]   Date of Service  08/08/2019  HPI/Events of Note  Ordered for scheduled KCl rpeletion orally but is NPO. Needs this changed to IV rpeletion. K currently 3.6 with Cr 1.2. Getting diuresed.   eICU Interventions  Order 40 mEq KCl IV. D/c'd PO orders since unable to take PO.     Intervention Category Minor Interventions: Electrolytes abnormality - evaluation and management  [name] Dianne Whelchel 08/08/2019, 4:02 AM

## 2019-08-08 NOTE — Progress Notes (Signed)
eLink Physician-Brief Progress Note Patient Name: Brian Brown DOB: March 03, 1932 MRN: [id number]   Date of Service  08/08/2019  HPI/Events of Note  New patient evaluation. 88M with history of CVA, CAD, pAFib on Eliquis and HTN who originally presented to [facility] ED with somewhat vague complaints (fatigue, some leg swelling)  where he was found to have a 87mm thalamic hemorrhage on NCHCT as well as a reduced LVEF (~21%) on TTE. Neurology on-call requested transfer to [facility] Neuro-ICU for management as an [place]. Repeat NCHCT showed stability of the area of hemorrhage.  On my examination of the patient via camera, he is currently comfortable and sleeping in bed. BP 147/100 (MAP 114). He is exhibiting Cheyne-Stokes respirations which is not unexpected given both his systolic HF and potentially also his ICH. He is also in rate-controlled Afib with ventricular rates ~ 75-85 bpm.   eICU Interventions  - BP control as outlined in Dr. Juline Patch note with PRN labetalol and hydralazine. - Monitor neuro exam, repeat NCHCT if changes. Neuro following. - Cardiology consult for new finding of severely reduced LVEF. In interim, diuresis with Lasix given findings of LE edema. - Continue home metoprolol for pAFib. Holding home Eliquis given ICH.      Intervention Category Evaluation Type: New Patient Evaluation  [name] [name] 08/08/2019, 3:15 AM

## 2019-08-09 DIAGNOSIS — E43 Unspecified severe protein-calorie malnutrition: Secondary | ICD-10-CM

## 2019-08-09 DIAGNOSIS — N179 Acute kidney failure, unspecified: Secondary | ICD-10-CM

## 2019-08-09 DIAGNOSIS — S2231XA Fracture of one rib, right side, initial encounter for closed fracture: Secondary | ICD-10-CM

## 2019-08-09 DIAGNOSIS — R7401 Elevation of levels of liver transaminase levels: Secondary | ICD-10-CM

## 2019-08-09 DIAGNOSIS — I5041 Acute combined systolic (congestive) and diastolic (congestive) heart failure: Secondary | ICD-10-CM

## 2019-08-09 DIAGNOSIS — R778 Other specified abnormalities of plasma proteins: Secondary | ICD-10-CM

## 2019-08-09 DIAGNOSIS — I10 Essential (primary) hypertension: Secondary | ICD-10-CM

## 2019-08-09 DIAGNOSIS — I480 Paroxysmal atrial fibrillation: Secondary | ICD-10-CM

## 2019-08-09 LAB — BASIC METABOLIC PANEL
Anion gap: 10 (ref 5–15)
BUN: 21 mg/dL (ref 8–23)
CO2: 31 mmol/L (ref 22–32)
Calcium: 9.1 mg/dL (ref 8.9–10.3)
Chloride: 99 mmol/L (ref 98–111)
Creatinine, Ser: 1.42 mg/dL — ABNORMAL HIGH (ref 0.61–1.24)
GFR calc Af Amer: 52 mL/min — ABNORMAL LOW (ref >60)
GFR calc non Af Amer: 45 mL/min — ABNORMAL LOW (ref >60)
Glucose, Bld: 108 mg/dL — ABNORMAL HIGH (ref 70–99)
Potassium: 3.1 mmol/L — ABNORMAL LOW (ref 3.5–5.1)
Sodium: 140 mmol/L (ref 135–145)

## 2019-08-09 LAB — LIPID PANEL
Cholesterol: 120 mg/dL (ref 0–200)
HDL: 41 mg/dL (ref >40)
LDL Cholesterol: 70 mg/dL (ref 0–99)
Total CHOL/HDL Ratio: 2.9 RATIO
Triglycerides: 44 mg/dL (ref <150)
VLDL: 9 mg/dL (ref 0–40)

## 2019-08-09 LAB — CBC
HCT: 41.1 % (ref 39.0–52.0)
Hemoglobin: 13.7 g/dL (ref 13.0–17.0)
MCH: 30.2 pg (ref 26.0–34.0)
MCHC: 33.3 g/dL (ref 30.0–36.0)
MCV: 90.5 fL (ref 80.0–100.0)
Platelets: 190 10*3/uL (ref 150–400)
RBC: 4.54 MIL/uL (ref 4.22–5.81)
RDW: 14.6 % (ref 11.5–15.5)
WBC: 7.4 10*3/uL (ref 4.0–10.5)
nRBC: 0 % (ref 0.0–0.2)

## 2019-08-09 LAB — AMMONIA: Ammonia: 46 umol/L — ABNORMAL HIGH (ref 9–35)

## 2019-08-09 LAB — HEMOGLOBIN A1C
Hgb A1c MFr Bld: 5.9 % — ABNORMAL HIGH (ref 4.8–5.6)
Mean Plasma Glucose: 122.63 mg/dL

## 2019-08-09 MED ORDER — ASPIRIN EC 81 MG PO TBEC
81.0000 mg | DELAYED_RELEASE_TABLET | Freq: Every day | ORAL | Status: DC
  Administered 2019-08-09 – 2019-08-11 (×3): 81 mg via ORAL
  Filled 2019-08-09 (×3): qty 1

## 2019-08-09 NOTE — Progress Notes (Addendum)
Progress Note  Patient Name: Brian Brown Date of Encounter: 08/09/2019  [facility] Cardiologist: Larae Grooms, MD   Subjective   Pt had been sleeping   Awake now   Denies CP  Breathing is OK   Inpatient Medications    Scheduled Meds:  atorvastatin  40 mg Oral QHS   carvedilol  3.125 mg Oral BID WC   Chlorhexidine Gluconate Cloth  6 each Topical Daily   famotidine  20 mg Oral QHS   feeding supplement (ENSURE ENLIVE)  237 mL Oral TID BM   losartan  25 mg Oral Daily   multivitamin with minerals  1 tablet Oral Daily   sodium chloride flush  3 mL Intravenous Once   sodium chloride flush  3 mL Intravenous Q12H   Continuous Infusions:  sodium chloride     PRN Meds: sodium chloride, acetaminophen, docusate sodium, ondansetron (ZOFRAN) IV, polyethylene glycol, sodium chloride flush   Vital Signs    Vitals:   08/09/19 0400 08/09/19 0404 08/09/19 0500 08/09/19 0600  BP: 117/77  115/90 (!) 122/97  Pulse: 93  69 85  Resp: (!) 31  14 (!) 33  Temp: 97.8 F (36.6 C)     TempSrc: Oral     SpO2: 100%  100% 100%  Weight:  69.6 kg    Height:        Intake/Output Summary (Last 24 hours) at 08/09/2019 0720 Last data filed at 08/09/2019 0300 Gross per 24 hour  Intake 738.68 ml  Output 1950 ml  Net -1211.32 ml   Net neg 3.7 L   Last 3 Weights 08/09/2019 08/08/2019 08/07/2019  Weight (lbs) 153 lb 7 oz 159 lb 9.8 oz 168 lb  Weight (kg) 69.6 kg 72.4 kg 76.204 kg      Telemetry    afib 80s   - Personally Reviewed  ECG    No new  - Personally Reviewed  Physical Exam   GEN: No acute distress.   Neck: No obvious JVD  Cardiac: Irreg irreg  , no murmurs Respiratory: Clear to auscultation bilaterally.anteirorly  GI: Soft, nontender, non-distended  MS: No LE edema; No  Labs    High Sensitivity Troponin:   Recent Labs  Lab 08/06/19 1654 08/06/19 1854  TROPONINIHS 35* 35*      Chemistry Recent Labs  Lab 08/06/19 1642 08/07/19 0449 08/09/19 0417  NA 142  141 140  K 3.4* 3.6 3.1*  CL 106 100 99  CO2 25 27 31   GLUCOSE 123* 109* 108*  BUN 25* 20 21  CREATININE 1.43* 1.25* 1.42*  CALCIUM 8.7* 9.5 9.1  PROT 6.8  --   --   ALBUMIN 3.4*  --   --   AST 53*  --   --   ALT 65*  --   --   ALKPHOS 89  --   --   BILITOT 1.5*  --   --   GFRNONAA 44* 52* 45*  GFRAA 51* >60 52*  ANIONGAP 11 14 10      Hematology Recent Labs  Lab 08/06/19 1642 08/09/19 0417  WBC 6.0 7.4  RBC 4.28 4.54  HGB 12.9* 13.7  HCT 39.2 41.1  MCV 91.6 90.5  MCH 30.1 30.2  MCHC 32.9 33.3  RDW 14.9 14.6  PLT 183 190    BNP Recent Labs  Lab 08/06/19 1642  BNP 1,349.7*     DDimer No results for input(s): DDIMER in the last 168 hours.   Radiology    CT  HEAD WO CONTRAST  Result Date: 08/07/2019 CLINICAL DATA:  Stroke.  Follow-up intracranial hemorrhage. EXAM: CT HEAD WITHOUT CONTRAST TECHNIQUE: Contiguous axial images were obtained from the base of the skull through the vertex without intravenous contrast. COMPARISON:  CT head without contrast 08/07/2019 at 6:10 p.m. FINDINGS: Brain: Right thalamic hemorrhage is stable in size at 10 mm. Surrounding edema is stable. No new hemorrhage is present. Moderate atrophy and white matter changes are stable. Cortical infarct of the right parietal lobe is stable. No new infarcts are present. Basal ganglia are otherwise within normal limits. Remote infarct of the anterior right insular cortex is again noted. Remote infarct of the medial right occipital lobe is again noted. Vascular: No hyperdense vessel or unexpected calcification. Skull: Calvarium is intact. No focal lytic or blastic lesions are present. No significant extracranial soft tissue lesion is present. Sinuses/Orbits: The paranasal sinuses and mastoid air cells are clear. Bilateral lens replacements are noted. Globes and orbits are otherwise unremarkable. IMPRESSION: 1. Stable 10 mm right thalamic hemorrhage with surrounding edema. 2. Stable atrophy and white matter  disease. 3. Stable remote infarcts of the right parietal lobe, anterior right insular cortex, and medial right occipital lobe. Electronically Signed   By: San Morelle M.D.   On: 08/07/2019 22:49   CT HEAD WO CONTRAST  Result Date: 08/07/2019 CLINICAL DATA:  Mental status change, confusion EXAM: CT HEAD WITHOUT CONTRAST TECHNIQUE: Contiguous axial images were obtained from the base of the skull through the vertex without intravenous contrast. COMPARISON:  CT head 02/13/2018 FINDINGS: Brain: 10 mm hyperdensity right thalamus is new since the prior study most consistent with acute hemorrhage. No significant mass-effect or surrounding edema. Ventricle size normal. Patchy white matter hypodensity bilaterally is chronic and unchanged. Chronic infarct right occipital lobe was not present previously. Negative for acute infarct. Vascular: Negative for hyperdense vessel Skull: Negative Sinuses/Orbits: Negative Other: None IMPRESSION: 10 mm hyperdensity right thalamus compatible with acute hemorrhage. Atrophy and chronic ischemic change. Chronic infarct right occipital lobe. These results were called by telephone at the time of interpretation on 08/07/2019 at 6:45 pm to provider [facility] , who verbally acknowledged these results. Electronically Signed   By: Franchot Gallo M.D.   On: 08/07/2019 18:43   MR ANGIO HEAD WO CONTRAST  Result Date: 08/08/2019 CLINICAL DATA:  Rule out metastatic disease. Intracranial hemorrhage on CT. Atrial fibrillation on Eliquis. EXAM: MRI HEAD WITHOUT AND WITH CONTRAST MRA HEAD WITHOUT CONTRAST TECHNIQUE: Multiplanar, multiecho pulse sequences of the brain and surrounding structures were obtained without and with intravenous contrast. Angiographic images of the head were obtained using MRA technique without contrast. CONTRAST:  80mL GADAVIST GADOBUTROL 1 MMOL/ML IV SOLN COMPARISON:  CT head 08/07/2019 FINDINGS: MRI HEAD FINDINGS Brain: Hemorrhage in the right lateral thalamus  measuring approximately 10 mm. There is central methemoglobin suggesting this is a subacute hemorrhage, possibly 19 week old. Additional areas of chronic microhemorrhage in the occipital lobe bilaterally and in the right cerebellum and brainstem bilaterally. Generalized atrophy. Patchy white matter hyperintensity bilaterally. Chronic infarct right occipital lobe. Negative for acute infarct. No enhancing metastatic deposits identified. Vascular: Normal arterial flow voids Skull and upper cervical spine: No focal skeletal lesion. Sinuses/Orbits: Mild mucosal edema paranasal sinuses. Bilateral cataract extraction Other: None MRA HEAD FINDINGS Both vertebral arteries patent to the basilar without stenosis. The left vertebral artery is tortuous with flattening of the ventral surface of the medulla. Small PICA bilaterally. AICA patent on the left. Basilar widely patent. AICA, superior cerebellar,  posterior cerebral arteries patent bilaterally without stenosis. Cavernous carotid patent bilaterally without stenosis. Anterior and middle cerebral arteries patent bilaterally without stenosis or large vessel occlusion. Negative for cerebral aneurysm. IMPRESSION: 1. 1 cm hemorrhage right lateral thalamus appears subacute with methemoglobin present. Additional areas of chronic microhemorrhage in the brain. This may be due to hypertension 2. Negative for metastatic disease. No enhancing lesions in the brain. 3. Moderate chronic microvascular ischemic change in the white matter. 4. Negative MRA head Electronically Signed   By: Franchot Gallo M.D.   On: 08/08/2019 11:42   MR BRAIN W WO CONTRAST  Result Date: 08/08/2019 CLINICAL DATA:  Rule out metastatic disease. Intracranial hemorrhage on CT. Atrial fibrillation on Eliquis. EXAM: MRI HEAD WITHOUT AND WITH CONTRAST MRA HEAD WITHOUT CONTRAST TECHNIQUE: Multiplanar, multiecho pulse sequences of the brain and surrounding structures were obtained without and with intravenous contrast.  Angiographic images of the head were obtained using MRA technique without contrast. CONTRAST:  41mL GADAVIST GADOBUTROL 1 MMOL/ML IV SOLN COMPARISON:  CT head 08/07/2019 FINDINGS: MRI HEAD FINDINGS Brain: Hemorrhage in the right lateral thalamus measuring approximately 10 mm. There is central methemoglobin suggesting this is a subacute hemorrhage, possibly 65 week old. Additional areas of chronic microhemorrhage in the occipital lobe bilaterally and in the right cerebellum and brainstem bilaterally. Generalized atrophy. Patchy white matter hyperintensity bilaterally. Chronic infarct right occipital lobe. Negative for acute infarct. No enhancing metastatic deposits identified. Vascular: Normal arterial flow voids Skull and upper cervical spine: No focal skeletal lesion. Sinuses/Orbits: Mild mucosal edema paranasal sinuses. Bilateral cataract extraction Other: None MRA HEAD FINDINGS Both vertebral arteries patent to the basilar without stenosis. The left vertebral artery is tortuous with flattening of the ventral surface of the medulla. Small PICA bilaterally. AICA patent on the left. Basilar widely patent. AICA, superior cerebellar, posterior cerebral arteries patent bilaterally without stenosis. Cavernous carotid patent bilaterally without stenosis. Anterior and middle cerebral arteries patent bilaterally without stenosis or large vessel occlusion. Negative for cerebral aneurysm. IMPRESSION: 1. 1 cm hemorrhage right lateral thalamus appears subacute with methemoglobin present. Additional areas of chronic microhemorrhage in the brain. This may be due to hypertension 2. Negative for metastatic disease. No enhancing lesions in the brain. 3. Moderate chronic microvascular ischemic change in the white matter. 4. Negative MRA head Electronically Signed   By: Franchot Gallo M.D.   On: 08/08/2019 11:42   ECHOCARDIOGRAM COMPLETE  Result Date: 08/07/2019    ECHOCARDIOGRAM REPORT   Patient Name:   Brian Brown Date of  Exam: 08/07/2019 Medical Rec #:  [id number]        Height:       68.0 in Accession #:    [id number]       Weight:       165.0 lb Date of Birth:  10/31/1932        BSA:          1.883 m Patient Age:    38 years         BP:           142/101 mmHg Patient Gender: M                HR:           85 bpm. Exam Location:  Inpatient Procedure: 2D Echo, 3D Echo, Color Doppler and Cardiac Doppler Indications:    E56.31 Acute diastolic (congestive) heart failure  History:        Patient has prior history of Echocardiogram examinations,  most                 recent 10/20/2018. CHF, CAD, Pacemaker, Arrythmias:Atrial                 Fibrillation; Risk Factors:Hypertension and Sleep Apnea. COVID+                 on 11/07/18.  Sonographer:    Raquel Sarna Senior RDCS Referring Phys: [id number] [place] [facility]  Sonographer Comments: Windows limited by small rib spacing. IMPRESSIONS  1. Left ventricular ejection fraction by 3D volume is 21 %. The left ventricle has severely decreased function. The left ventricle demonstrates regional wall motion abnormalities (see scoring diagram/findings for description). There is mild left ventricular hypertrophy. Left ventricular diastolic parameters are indeterminate.  2. Right ventricular systolic function is moderately reduced. The right ventricular size is normal. There is mildly elevated pulmonary artery systolic pressure. The estimated right ventricular systolic pressure is 06.3 mmHg.  3. Left atrial size was moderately dilated.  4. Right atrial size was moderately dilated.  5. The mitral valve is degenerative. Mild mitral valve regurgitation. No evidence of mitral stenosis.  6. The aortic valve is abnormal. Aortic valve regurgitation is trivial. No aortic stenosis is present.  7. The inferior vena cava is dilated in size with <50% respiratory variability, suggesting right atrial pressure of 15 mmHg. Comparison(s): A prior study was performed on 10/20/18. Prior images reviewed side by side. LVEF has decreased.  FINDINGS  Left Ventricle: Left ventricular ejection fraction by 3D volume is 21 %. The left ventricle has severely decreased function. The left ventricle demonstrates regional wall motion abnormalities. The left ventricular internal cavity size was normal in size. There is mild left ventricular hypertrophy. Left ventricular diastolic parameters are indeterminate.  LV Wall Scoring: The mid and distal lateral wall, mid anterolateral segment, mid inferoseptal segment, apical septal segment, mid anterior segment, and apical inferior segment are akinetic. The mid anteroseptal segment, basal anterolateral segment, apical anterior segment, mid inferior segment, and basal inferoseptal segment are hypokinetic. The basal anteroseptal segment is normal. Right Ventricle: The right ventricular size is normal. No increase in right ventricular wall thickness. Right ventricular systolic function is moderately reduced. There is mildly elevated pulmonary artery systolic pressure. The tricuspid regurgitant velocity is 2.57 m/s, and with an assumed right atrial pressure of 15 mmHg, the estimated right ventricular systolic pressure is 01.6 mmHg. Left Atrium: Left atrial size was moderately dilated. Right Atrium: Right atrial size was moderately dilated. Pericardium: There is no evidence of pericardial effusion. Mitral Valve: The mitral valve is degenerative in appearance. Mild mitral valve regurgitation. No evidence of mitral valve stenosis. Tricuspid Valve: The tricuspid valve is normal in structure. Tricuspid valve regurgitation is trivial. Aortic Valve: The aortic valve is abnormal. Aortic valve regurgitation is trivial. No aortic stenosis is present. There is severe calcifcation of the aortic valve. Pulmonic Valve: The pulmonic valve was normal in structure. Pulmonic valve regurgitation is mild. Aorta: The aortic root is normal in size and structure. Venous: The inferior vena cava is dilated in size with less than 50% respiratory  variability, suggesting right atrial pressure of 15 mmHg. IAS/Shunts: No atrial level shunt detected by color flow Doppler.  LEFT VENTRICLE PLAX 2D LVIDd:         4.30 cm LVIDs:         4.10 cm LV PW:         1.20 cm         3D Volume  EF LV IVS:        1.30 cm         LV 3D EF:    Left LVOT diam:     2.00 cm                      ventricular LV SV:         32                           ejection LV SV Index:   17                           fraction by LVOT Area:     3.14 cm                     3D volume                                             is 21 %.                                 3D Volume EF:                                3D EF:        21 % RIGHT VENTRICLE RV S prime:     5.36 cm/s TAPSE (M-mode): 1.3 cm LEFT ATRIUM              Index       RIGHT ATRIUM           Index LA diam:        4.60 cm  2.44 cm/m  RA Area:     25.00 cm LA Vol (A2C):   112.0 ml 59.47 ml/m RA Volume:   80.80 ml  42.90 ml/m LA Vol (A4C):   65.8 ml  34.94 ml/m LA Biplane Vol: 86.6 ml  45.98 ml/m  AORTIC VALVE LVOT Vmax:   64.40 cm/s LVOT Vmean:  42.000 cm/s LVOT VTI:    0.102 m  AORTA Ao Root diam: 3.60 cm Ao Asc diam:  3.70 cm TRICUSPID VALVE TR Peak grad:   26.4 mmHg TR Vmax:        257.00 cm/s  SHUNTS Systemic VTI:  0.10 m Systemic Diam: 2.00 cm Cherlynn Kaiser MD Electronically signed by Cherlynn Kaiser MD Signature Date/Time: 08/07/2019/12:31:38 PM    Final     Cardiac Studies     Cath: 10/19/18   The left ventricular systolic function is normal. LV end diastolic pressure is normal. The left ventricular ejection fraction is 55-65% by visual estimate. Prox RCA lesion is 30% stenosed. Dist RCA lesion is 20% stenosed. Ost Cx to Prox Cx lesion is 50% stenosed. Prox LAD to Mid LAD lesion is 60% stenosed. 1st Diag lesion is 90% stenosed. Dist LAD lesion is 30% stenosed. 2nd Mrg lesion is 90% stenosed.   1.  Severe one-vessel coronary artery disease with subtotal thrombotic occlusion of the distal OM2 at the  trifurcation of 3 terminal branches.  The vessel in this area is 1.5 mm in diameter and at a trifurcation and  thus not suitable for PCI.  The vessel is also very tortuous.  There is also moderate ostial left circumflex and proximal LAD disease. 2.  Normal LV systolic function and left ventricular end-diastolic pressure.   Recommendations: Recommend aggressive medical therapy.  Eliquis can be resumed tonight.  I favor that he continues to take an antiplatelet medication given this thrombotic event.  He seems to be already on Plavix which should be continued."   Echocardiogram 08/07/19:   1. Left ventricular ejection fraction by 3D volume is 21 %. The left  ventricle has severely decreased function. The left ventricle demonstrates  regional wall motion abnormalities (see scoring diagram/findings for  description). There is mild left  ventricular hypertrophy. Left ventricular diastolic parameters are  indeterminate.   2. Right ventricular systolic function is moderately reduced. The right  ventricular size is normal. There is mildly elevated pulmonary artery  systolic pressure. The estimated right ventricular systolic pressure is  69.6 mmHg.   3. Left atrial size was moderately dilated.   4. Right atrial size was moderately dilated.   5. The mitral valve is degenerative. Mild mitral valve regurgitation. No  evidence of mitral stenosis.   6. The aortic valve is abnormal. Aortic valve regurgitation is trivial.  No aortic stenosis is present.   7. The inferior vena cava is dilated in size with <50% respiratory  variability, suggesting right atrial pressure of 15 mmHg.   Comparison(s): A prior study was performed on 10/20/18. Prior images  reviewed side by side. LVEF has decreased.     Patient Profile     Brian Brown is a 86 y.o. male with a hx of CVA 02/2018 with PAF confirmed on loop recorder on Eliquis, obstructive sleep apnea (not on CPAP), CAD with NSTEMI treated medically as  outlined below, mild cardiomyopathy (EF 45-50%), hypertension, hyperlipidemia, CKD II, mild dilation of ascending aorta, and prostate CA who is being seen today for the evaluation of CHF at the request of Dr. Valeta Harms.  Assessment & Plan    1  Acute on chronic systolic CHF   Volume does not appear bad  He diuresed 3.8 L since admt     LVEF is down from previous echo   He diuresed signif  This will need to be followed as outpt  2  Afib   Rate controlled  Not a candidate for anticoag   3  CAD   Last cath in Oct 2020   Severe 1 V CAD with subtotal thrombotic occluse of distal OM    No symtoms of angina    Cont Plavix  4  HTN  BP is labile     For questions or updates, please contact [facility] Please consult [email or web address] for contact info under        Signed, Dorris Carnes, MD  08/09/2019, 7:20 AM

## 2019-08-09 NOTE — Evaluation (Signed)
Physical Therapy Evaluation Patient Details Name: Brian Brown MRN: [id number] DOB: 10-Nov-1932 Today's Date: 08/09/2019   History of Present Illness  86 y.o. male with medical history significant for CVA, CAD s/p NSTEMI, cardiomyopathy, paroxysmal atrial fibrillation on Eliquis, OSA not on CPAP, hypertension, hepatitis C who presents with concerns of worsening fatigue. 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  Clinical Impression  Pt presents to PT with deficits in functional mobility, gait, balance, endurance, power, strength, and activity tolerance. Pt demonstrates reduced endurance and generalized LE weakness which increase the pt's risk for falls. Pt demonstrates R foot drag with fatigue, which he is aware of, and is able to correct some with PT cues. Pt will benefit from continued acute PT services to improve activity tolerance and LE strength. PT recommends discharge home with HHPT, a RW, and assistance from nearby relatives intermittently during the day.    Follow Up Recommendations Home health PT;Supervision/Assistance - 24 hour    Equipment Recommendations  Rolling walker with 5" wheels (pt may own RW at this time, need to confirm)    Recommendations for Other Services       Precautions / Restrictions Precautions Precautions: Fall Restrictions Weight Bearing Restrictions: No      Mobility  Bed Mobility               General bed mobility comments: pt received and left sitting in recliner  Transfers Overall transfer level: Needs assistance Equipment used: Rolling walker (2 wheeled) Transfers: Sit to/from Stand Sit to Stand: Min assist            Ambulation/Gait Ambulation/Gait assistance: Min guard Gait Distance (Feet): 80 Feet Assistive device: Rolling walker (2 wheeled) Gait Pattern/deviations: Step-to pattern;Decreased dorsiflexion - right Gait velocity: reduced Gait velocity interpretation: <1.8 ft/sec,  indicate of risk for recurrent falls General Gait Details: pt with some R foot drag noted with fatigue, otherwise reduced stride length and slowed gait speed  Stairs            Wheelchair Mobility    Modified Rankin (Stroke Patients Only) Modified Rankin (Stroke Patients Only) Pre-Morbid Rankin Score: No symptoms Modified Rankin: Moderately severe disability     Balance Overall balance assessment: Needs assistance Sitting-balance support: No upper extremity supported;Feet supported Sitting balance-Leahy Scale: Good     Standing balance support: Bilateral upper extremity supported Standing balance-Leahy Scale: Poor Standing balance comment: reliant on UE support of RW                             Pertinent Vitals/Pain Pain Assessment: No/denies pain    Home Living Family/patient expects to be discharged to:: Private residence Living Arrangements: Alone Available Help at Discharge: Family;Available PRN/intermittently Type of Home: House Home Access: Stairs to enter Entrance Stairs-Rails: Psychiatric nurse of Steps: 4 Home Layout: One level Home Equipment: Walker - 4 wheels;Cane - single point      Prior Function Level of Independence: Independent         Comments: recently using a cane over the past month due to fatigue, normally independent and walking his 80lb dog     Hand Dominance   Dominant Hand: Right    Extremity/Trunk Assessment   Upper Extremity Assessment Upper Extremity Assessment: Overall WFL for tasks assessed    Lower Extremity Assessment Lower Extremity Assessment: Generalized weakness    Cervical / Trunk Assessment Cervical / Trunk Assessment:  Normal  Communication   Communication: No difficulties  Cognition Arousal/Alertness: Awake/alert Behavior During Therapy: WFL for tasks assessed/performed Overall Cognitive Status: Within Functional Limits for tasks assessed                                         General Comments General comments (skin integrity, edema, etc.): VSS on RA with activity, pulse ox with unreliable reading at times due to pt gripping walker but pt is able to converse during ambulation without any noticeable increase in work of breathing    Exercises     Assessment/Plan    PT Assessment Patient needs continued PT services  PT Problem List Decreased strength;Decreased activity tolerance;Decreased balance;Decreased mobility;Decreased knowledge of use of DME;Decreased safety awareness;Cardiopulmonary status limiting activity       PT Treatment Interventions DME instruction;Gait training;Stair training;Functional mobility training;Therapeutic activities;Therapeutic exercise;Balance training;Neuromuscular re-education;Patient/family education    PT Goals (Current goals can be found in the Care Plan section)  Acute Rehab PT Goals Patient Stated Goal: To return to independence PT Goal Formulation: With patient Time For Goal Achievement: 08/23/19 Potential to Achieve Goals: Good    Frequency Min 4X/week   Barriers to discharge        Co-evaluation               AM-PAC PT "6 Clicks" Mobility  Outcome Measure Help needed turning from your back to your side while in a flat bed without using bedrails?: None Help needed moving from lying on your back to sitting on the side of a flat bed without using bedrails?: A Little Help needed moving to and from a bed to a chair (including a wheelchair)?: A Little Help needed standing up from a chair using your arms (e.g., wheelchair or bedside chair)?: A Little Help needed to walk in hospital room?: A Little Help needed climbing 3-5 steps with a railing? : A Lot 6 Click Score: 18    End of Session   Activity Tolerance: Patient tolerated treatment well Patient left: in chair;with call bell/phone within reach;with chair alarm set Nurse Communication: Mobility status PT Visit Diagnosis: Other  abnormalities of gait and mobility (R26.89);Muscle weakness (generalized) (M62.81)    Time: 3888-2800 PT Time Calculation (min) (ACUTE ONLY): 21 min   Charges:   PT Evaluation $PT Eval Moderate Complexity: 1 Mod          Zenaida Niece, PT, DPT Acute Rehabilitation Pager: [phone]   Zenaida Niece 08/09/2019, 12:37 PM

## 2019-08-09 NOTE — Evaluation (Signed)
Occupational Therapy Evaluation Patient Details Name: Brian Brown MRN: [id number] DOB: 1932-10-23 Today's Date: 08/09/2019    History of Present Illness 86 y.o. male with medical history significant for CVA, CAD s/p NSTEMI, cardiomyopathy, paroxysmal atrial fibrillation on Eliquis, OSA not on CPAP, hypertension, hepatitis C who presents with concerns of worsening fatigue. 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   Clinical Impression   Pt admitted with above. He demonstrates the below listed deficits and will benefit from continued OT to maximize safety and independence with BADLs.  Pt presents to OT with generalized weakness, impaired balance, Lt visual field deficit (from previous CVA), impaired cognition.  He currently requires set up assist - min A for ADLs and functional mobility.  He reports he lives alone, but has good family support and that his daughters will stay with him at discharge. Will follow acutely.  Recommend HHOT.       Follow Up Recommendations  Home health OT;Supervision/Assistance - 24 hour    Equipment Recommendations  None recommended by OT    Recommendations for Other Services       Precautions / Restrictions Precautions Precautions: Fall      Mobility Bed Mobility Overal bed mobility: Needs Assistance Bed Mobility: Supine to Sit     Supine to sit: Supervision        Transfers Overall transfer level: Needs assistance Equipment used: Rolling walker (2 wheeled) Transfers: Sit to/from [facility] Sit to Stand: Min assist Stand pivot transfers: Min guard       General transfer comment: assist to move sit to stand     Balance Overall balance assessment: Needs assistance Sitting-balance support: No upper extremity supported;Feet supported Sitting balance-Leahy Scale: Good     Standing balance support: Bilateral upper extremity supported Standing balance-Leahy Scale:  Poor Standing balance comment: reliant on UE support of RW                           ADL either performed or assessed with clinical judgement   ADL Overall ADL's : Needs assistance/impaired Eating/Feeding: Modified independent;Sitting   Grooming: Wash/dry hands;Oral care;Wash/dry face;Brushing hair;Standing;Minimal assistance   Upper Body Bathing: Set up;Sitting   Lower Body Bathing: Minimal assistance;Sit to/from stand   Upper Body Dressing : Minimal assistance;Sitting   Lower Body Dressing: Minimal assistance;Sit to/from stand   Toilet Transfer: Minimal assistance;Ambulation;Comfort height toilet;Grab bars;RW   Toileting- Clothing Manipulation and Hygiene: Minimal assistance;Sit to/from stand       Functional mobility during ADLs: Minimal assistance;Rolling walker General ADL Comments: requires assist for balance      Vision Patient Visual Report: Peripheral vision impairment Vision Assessment?: Yes Eye Alignment: Within Functional Limits Ocular Range of Motion: Within Functional Limits Alignment/Gaze Preference: Within Defined Limits Tracking/Visual Pursuits: Able to track stimulus in all quads without difficulty Visual Fields: Left homonymous hemianopsia Additional Comments: Pt is familiar to this OT from previous admission.  Old chart reviewed and pt with h/o Lt field deficit from previous CVA, and he reports he has been seeing an eye doctor who has been monitoring his vision      Perception Perception Perception Tested?: Yes   Praxis Praxis Praxis tested?: Within functional limits    Pertinent Vitals/Pain Pain Assessment: No/denies pain     Hand Dominance Right   Extremity/Trunk Assessment Upper Extremity Assessment Upper Extremity Assessment: Overall WFL for tasks assessed   Lower Extremity Assessment Lower  Extremity Assessment: Generalized weakness   Cervical / Trunk Assessment Cervical / Trunk Assessment: Normal   Communication  Communication Communication: No difficulties   Cognition Arousal/Alertness: Awake/alert Behavior During Therapy: WFL for tasks assessed/performed Overall Cognitive Status: No family/caregiver present to determine baseline cognitive functioning                                 General Comments: Pt scored 10/28 indicative of cognitive deficit. He thought the time was 10:30 when in fact it was 4:15; and demonstrated difficulty with recall    General Comments  VSS.   Discussed recommendation that he not drive with him     Exercises     Shoulder Instructions      Home Living Family/patient expects to be discharged to:: Private residence Living Arrangements: Alone Available Help at Discharge: Family;Available PRN/intermittently Type of Home: House Home Access: Stairs to enter [employer] of Steps: 4 Entrance Stairs-Rails: Right;Left Home Layout: One level     Bathroom Shower/Tub: Teacher, early years/pre: Standard     Home Equipment: Environmental consultant - 4 wheels;Cane - single point   Additional Comments: Pt lives alone with his dog.  He has 3 daughters who live out of state, but who are supportive, and he has a grandson who lives locally       Prior Functioning/Environment Level of Independence: Independent        Comments: recently using a cane over the past month due to fatigue, normally independent and walking his 80lb dog        OT Problem List: Decreased activity tolerance;Impaired balance (sitting and/or standing);Impaired vision/perception;Decreased cognition;Decreased safety awareness      OT Treatment/Interventions: Self-care/ADL training;DME and/or AE instruction;Therapeutic activities;Cognitive remediation/compensation;Visual/perceptual remediation/compensation;Patient/family education;Balance training    OT Goals(Current goals can be found in the care plan section) Acute Rehab OT Goals Patient Stated Goal: to go home and be with his  dog  OT Goal Formulation: With patient Time For Goal Achievement: 08/23/19 Potential to Achieve Goals: Good ADL Goals Pt Will Perform Grooming: with supervision;standing Pt Will Perform Upper Body Bathing: with set-up;sitting Pt Will Perform Lower Body Bathing: with supervision;sit to/from stand Pt Will Perform Upper Body Dressing: with set-up;sitting Pt Will Perform Lower Body Dressing: with supervision;sit to/from stand Pt Will Transfer to Toilet: with supervision;ambulating;regular height toilet;bedside commode;grab bars Pt Will Perform Toileting - Clothing Manipulation and hygiene: with supervision;sit to/from stand  OT Frequency: Min 2X/week   Barriers to D/C:            Co-evaluation              AM-PAC OT "6 Clicks" Daily Activity     Outcome Measure Help from another person eating meals?: None Help from another person taking care of personal grooming?: A Little Help from another person toileting, which includes using toliet, bedpan, or urinal?: A Little Help from another person bathing (including washing, rinsing, drying)?: A Little Help from another person to put on and taking off regular upper body clothing?: A Little Help from another person to put on and taking off regular lower body clothing?: A Little 6 Click Score: 19   End of Session Equipment Utilized During Treatment: Rolling walker;Gait belt Nurse Communication: Mobility status  Activity Tolerance: Patient tolerated treatment well Patient left: in chair;with call bell/phone within reach;with chair alarm set  OT Visit Diagnosis: Unsteadiness on feet (R26.81);Low vision, both eyes (H54.2)  Time: 9030-1499 OT Time Calculation (min): 34 min Charges:  OT General Charges $OT Visit: 1 Visit OT Evaluation $OT Eval Moderate Complexity: 1 Mod OT Treatments $Self Care/Home Management : 8-22 mins  Nilsa Nutting., OTR/L Acute Rehabilitation Services Pager [phone] Office  [phone]   Lucille Passy M 08/09/2019, 5:07 PM

## 2019-08-09 NOTE — Progress Notes (Addendum)
PROGRESS NOTE    Brian Brown  HYQ:[id number] DOB: 01-29-1932 DOA: 08/06/2019 PCP: Seward Carol, MD   Brief Narrative:  Brian HURTA is a 86 y.o. male with medical history significant for CVA w/ R sided weakness, CAD s/p NSTEMI, cardiomyopathy, paroxysmal atrial fibrillation on Eliquis, OSA not on CPAP, hypertension, hepatitis C who presents with concerns of worsening fatigue. For the past month he has felt progressive fatigue.  He usually walks his dog about twice a day but for the past 2 weeks he could no longer do that.  He felt short of breath just doing dishes or making his bed.  He also had a fall last week taking trash out and felt like his "hips slipped out from under him."  Denies any lightheadedness or loss of consciousness.  He has also been having on and off lower extremity edema with the right worse than the left ever since he had a stroke last year.  However in the past 2 to 3 days he noticed that both legs have been significantly worse.  He takes Lasix daily.He also is on Eliquis for paroxysmal atrial fibrillation denies any missed doses. Otherwise he denies chest pain, orthopnea, nausea vomiting or diarrhea. Pt lives alone but has family that checks on him on a regarding basis.He was afebrile and hypertensive with systolics of 952W over 413K.  Troponin mildly elevated at 35 although generally run in the 50s in the past.  BNP of 1249.  K of 3.4.  Creatinine of 1.43 elevated from 1.15 from several months ago.  Mildly elevated AST of 53, ALT of 65, total bilirubin of 1.5. Chest x-ray shows mild pulmonary vascular congestion and trace right pleural effusion. CTA shows equivocal tiny filling defect in posterior right lower lobe.  Small bilateral pleural effusion.  Nondisplaced lateral right fourth rib fracture   Assessment & Plan:   Active Problems:   Hypertension   PAF (paroxysmal atrial fibrillation) (HCC)   Acute CHF (congestive heart failure) (HCC)   AKI (acute kidney  injury) (HCC)   Elevated troponin   Transaminitis   Closed rib fracture   History of CVA (cerebrovascular accident)   Intracerebral hemorrhage (HCC)   Protein-calorie malnutrition, severe   Acute Systolic Heart Failure, POA - ECHO on 8/2 shows reduced LVEF of 21% with global hypokinesis -markedly worse given previous echo and 2020 showed LVEF of 45-50%.  Unclear if acutely decompensated or this is his new baseline. -Cardiology following, appreciate insight and recommendations  -Unclear if patient will continue IV diuresis versus transition to p.o.  Certainly would be reasonable for Entresto given reduced ejection fraction however he is also having ongoing hypotensive events.  Appreciate cardiology input. -Carvedilol held overnight due to hypotension -patient may not be able to tolerate core measures  Acute Respiratory Distress without hypoxia secondary to Above, POA Bilateral Pleural Effusions -Continues to saturate well on room air -Continue diuresisas above per cardiology  Right lateral thalamic ICH, on eliquis, holding - Neurology following, appreciate insight and recommendations - Continue strict blood pressure control, has been hypotensive if anything as above  Acute metabolic encephalopathy, POA, likely multifactorial  Concern for baseline dementia  Rule out delirium/sundowning -Patient awake alert oriented to person and time only this morning -Family indicates patient is "sharp" as he lives alone and is able to care for himself. -Unclear if patient's above small thalamic cerebral hemorrhage is large enough to cause mental status changes, certainly cannot rule out polypharmacy, delirium sundowning or worsening dementia -Certainly this  could be advancing dementia however family indicates patient has no known history or diagnosis of dementia. -Mildly uremic at intake, likely not high enough to cause overt symptoms, now within normal limits and patient's mental status appears  to be minimally improving. -No toxicology or UDS at intake, follow ammonia given elevated liver enzymes as below  HTN, essential - Goal SBP < 140 and DBP < 90 in setting of ICH Plan: - Carvedilol held overnight due to hypotension, may need to decrease patient's medication burden as above  Right 4th rib fx - Non-operative; Tylenol 650 mg Q4H PRN, pain well controlled at this time  History of CVA w/ r sided deficits, POA - Baseline: residual right sided weakness - See above acute ICH for management  HLD - Continue home statin  Acute kidney injury in setting of CKD, stage II Bilateral low density kidney lesions - Kidney ultrasound 2014: right and left kidney with multiple renal cysts - Baseline creatinine: 1.1-1.4  Lab Results  Component Value Date   CREATININE 1.42 (H) 08/09/2019   CREATININE 1.25 (H) 08/07/2019   CREATININE 1.43 (H) 08/06/2019    Elevated LFTs, mild - Suspect multifactorial with liver lesions, known history of Hepatitis C Virus, and hepatic congestion in setting of heart failure. -Patient would likely benefit from repeat imaging in 3 to 6 months to further evaluate Hepatic Function Latest Ref Rng & Units 08/06/2019 02/13/2018 01/27/2011  Total Protein 6.5 - 8.1 g/dL 6.8 6.6 7.6  Albumin 3.5 - 5.0 g/dL 3.4(L) 3.2(L) 3.6  AST 15 - 41 U/L 53(H) 13(L) 19  ALT 0 - 44 U/L 65(H) 15 16  Alk Phosphatase 38 - 126 U/L 89 64 90  Total Bilirubin 0.3 - 1.2 mg/dL 1.5(H) 0.8 0.4   DVT prophylaxis: SCD use only in the setting of intracranial hemorrhage as above Code Status: Full Family Communication: None present  Status is: Inpatient  Dispo: The patient is from: Home              Anticipated d/c is to: To be determined              Anticipated d/c date is: Likely 74 to 96 hours pending clinical course and further work-up              Patient currently not medically stable for discharge due to ongoing need for close monitoring, further work-up and evaluation in the  setting of intracranial hemorrhage, hypotension, mental status changes from baseline and unsafe disposition as patient lives alone at home.  Consultants:   PCCM, neurology, cardiology  Antimicrobials:  None indicated  Subjective: No acute issues or events overnight, patient's borderline hypotension, medications held overnight.  Patient awake alert but oriented only to person and time, initially answering questions inappropriately however after further conversation he began to answer questions more accurately but still somewhat delayed in response and unable to verify information he should be well aware of such as where his children live or where we are currently.  He denies nausea, vomiting, diarrhea, constipation, headache, fevers, chills.  Objective: Vitals:   08/09/19 0400 08/09/19 0404 08/09/19 0500 08/09/19 0600  BP: 117/77  115/90 (!) 122/97  Pulse: 93  69 85  Resp: (!) 31  14 (!) 33  Temp: 97.8 F (36.6 C)     TempSrc: Oral     SpO2: 100%  100% 100%  Weight:  69.6 kg    Height:        Intake/Output Summary (Last 24 hours) at  08/09/2019 6967 Last data filed at 08/09/2019 0300 Gross per 24 hour  Intake 738.68 ml  Output 1950 ml  Net -1211.32 ml   Filed Weights   08/07/19 1351 08/08/19 0250 08/09/19 0404  Weight: 76.2 kg 72.4 kg 69.6 kg    Examination:  General:  Pleasantly resting in bed, No acute distress.  Alert to person and time only. HEENT:  Normocephalic atraumatic.  Sclerae nonicteric, noninjected.  Extraocular movements intact bilaterally. Neck:  Without mass or deformity.  Trachea is midline. Lungs:  Clear to auscultate bilaterally without rhonchi, wheeze, or rales. Heart:  Regular rate and rhythm.  Without murmurs, rubs, or gallops. Abdomen:  Soft, nontender, nondistended.  Without guarding or rebound. Extremities: Without cyanosis, clubbing, edema, or obvious deformity. Vascular:  Dorsalis pedis and posterior tibial pulses palpable bilaterally. Skin:  Warm  and dry, no erythema, no ulcerations.     Data Reviewed: I have personally reviewed following labs and imaging studies  CBC: Recent Labs  Lab 08/06/19 1642 08/09/19 0417  WBC 6.0 7.4  HGB 12.9* 13.7  HCT 39.2 41.1  MCV 91.6 90.5  PLT 183 893   Basic Metabolic Panel: Recent Labs  Lab 08/06/19 1642 08/07/19 0449 08/09/19 0417  NA 142 141 140  K 3.4* 3.6 3.1*  CL 106 100 99  CO2 _0 GLUCOSE 123* 109* 108*  BUN 25* 20 21  CREATININE 1.43* 1.25* 1.42*  CALCIUM 8.7* 9.5 9.1   GFR: Estimated Creatinine Clearance: 36.8 mL/min (A) (by C-G formula based on SCr of 1.42 mg/dL (H)). Liver Function Tests: Recent Labs  Lab 08/06/19 1642  AST 53*  ALT 65*  ALKPHOS 89  BILITOT 1.5*  PROT 6.8  ALBUMIN 3.4*   No results for input(s): LIPASE, AMYLASE in the last 168 hours. No results for input(s): AMMONIA in the last 168 hours. Coagulation Profile: No results for input(s): INR, PROTIME in the last 168 hours. Cardiac Enzymes: No results for input(s): CKTOTAL, CKMB, CKMBINDEX, TROPONINI in the last 168 hours. BNP (last 3 results) No results for input(s): PROBNP in the last 8760 hours. HbA1C: Recent Labs    08/09/19 0417  HGBA1C 5.9*   CBG: No results for input(s): GLUCAP in the last 168 hours. Lipid Profile: Recent Labs    08/09/19 0417  CHOL 120  HDL 41  LDLCALC 70  TRIG 44  CHOLHDL 2.9   Thyroid Function Tests: No results for input(s): TSH, T4TOTAL, FREET4, T3FREE, THYROIDAB in the last 72 hours. Anemia Panel: No results for input(s): VITAMINB12, FOLATE, FERRITIN, TIBC, IRON, RETICCTPCT in the last 72 hours. Sepsis Labs: No results for input(s): PROCALCITON, LATICACIDVEN in the last 168 hours.  Recent Results (from the past 240 hour(s))  SARS Coronavirus 2 by RT PCR (hospital order, performed in [facility] hospital lab) Nasopharyngeal Urine, Clean Catch     Status: None   Collection Time: 08/06/19  7:50 PM   Specimen: Urine, Clean Catch;  Nasopharyngeal  Result Value Ref Range Status   SARS Coronavirus 2 NEGATIVE NEGATIVE Final    Comment: (NOTE) SARS-CoV-2 target nucleic acids are NOT DETECTED.  The SARS-CoV-2 RNA is generally detectable in upper and lower respiratory specimens during the acute phase of infection. The lowest concentration of SARS-CoV-2 viral copies this assay can detect is 250 copies / mL. A negative result does not preclude SARS-CoV-2 infection and should not be used as the sole basis for treatment or other patient management decisions.  A negative result may occur with improper specimen  collection / handling, submission of specimen other than nasopharyngeal swab, presence of viral mutation(s) within the areas targeted by this assay, and inadequate number of viral copies (<250 copies / mL). A negative result must be combined with clinical observations, patient history, and epidemiological information.  Fact Sheet for Patients:   StrictlyIdeas.no  Fact Sheet for Healthcare Providers: BankingDealers.co.za  This test is not yet approved or  cleared by the [place] FDA and has been authorized for detection and/or diagnosis of SARS-CoV-2 by FDA under an Emergency Use Authorization (EUA).  This EUA will remain in effect (meaning this test can be used) for the duration of the COVID-19 declaration under Section 564(b)(1) of the Act, 21 U.S.C. section 360bbb-3(b)(1), unless the authorization is terminated or revoked sooner.  Performed at [facility], [place] [address], [place], [place] [address]   MRSA PCR Screening     Status: None   Collection Time: 08/08/19  2:56 AM   Specimen: Nasopharyngeal  Result Value Ref Range Status   MRSA by PCR NEGATIVE NEGATIVE Final    Comment:        The GeneXpert MRSA Assay (FDA approved for NASAL specimens only), is one component of a comprehensive MRSA colonization surveillance program. It is  not intended to diagnose MRSA infection nor to guide or monitor treatment for MRSA infections. Performed at [facility], [place] [address], [place], [place] [address]          Radiology Studies: CT HEAD WO CONTRAST  Result Date: 08/07/2019 CLINICAL DATA:  Stroke.  Follow-up intracranial hemorrhage. EXAM: CT HEAD WITHOUT CONTRAST TECHNIQUE: Contiguous axial images were obtained from the base of the skull through the vertex without intravenous contrast. COMPARISON:  CT head without contrast 08/07/2019 at 6:10 p.m. FINDINGS: Brain: Right thalamic hemorrhage is stable in size at 10 mm. Surrounding edema is stable. No new hemorrhage is present. Moderate atrophy and white matter changes are stable. Cortical infarct of the right parietal lobe is stable. No new infarcts are present. Basal ganglia are otherwise within normal limits. Remote infarct of the anterior right insular cortex is again noted. Remote infarct of the medial right occipital lobe is again noted. Vascular: No hyperdense vessel or unexpected calcification. Skull: Calvarium is intact. No focal lytic or blastic lesions are present. No significant extracranial soft tissue lesion is present. Sinuses/Orbits: The paranasal sinuses and mastoid air cells are clear. Bilateral lens replacements are noted. Globes and orbits are otherwise unremarkable. IMPRESSION: 1. Stable 10 mm right thalamic hemorrhage with surrounding edema. 2. Stable atrophy and white matter disease. 3. Stable remote infarcts of the right parietal lobe, anterior right insular cortex, and medial right occipital lobe. Electronically Signed   By: San Morelle M.D.   On: 08/07/2019 22:49   CT HEAD WO CONTRAST  Result Date: 08/07/2019 CLINICAL DATA:  Mental status change, confusion EXAM: CT HEAD WITHOUT CONTRAST TECHNIQUE: Contiguous axial images were obtained from the base of the skull through the vertex without intravenous contrast. COMPARISON:  CT head 02/13/2018  FINDINGS: Brain: 10 mm hyperdensity right thalamus is new since the prior study most consistent with acute hemorrhage. No significant mass-effect or surrounding edema. Ventricle size normal. Patchy white matter hypodensity bilaterally is chronic and unchanged. Chronic infarct right occipital lobe was not present previously. Negative for acute infarct. Vascular: Negative for hyperdense vessel Skull: Negative Sinuses/Orbits: Negative Other: None IMPRESSION: 10 mm hyperdensity right thalamus compatible with acute hemorrhage. Atrophy and chronic ischemic change. Chronic infarct right occipital lobe.  These results were called by telephone at the time of interpretation on 08/07/2019 at 6:45 pm to provider [facility] , who verbally acknowledged these results. Electronically Signed   By: Marlan Palau M.D.   On: 08/07/2019 18:43   MR ANGIO HEAD WO CONTRAST  Result Date: 08/08/2019 CLINICAL DATA:  Rule out metastatic disease. Intracranial hemorrhage on CT. Atrial fibrillation on Eliquis. EXAM: MRI HEAD WITHOUT AND WITH CONTRAST MRA HEAD WITHOUT CONTRAST TECHNIQUE: Multiplanar, multiecho pulse sequences of the brain and surrounding structures were obtained without and with intravenous contrast. Angiographic images of the head were obtained using MRA technique without contrast. CONTRAST:  68mL GADAVIST GADOBUTROL 1 MMOL/ML IV SOLN COMPARISON:  CT head 08/07/2019 FINDINGS: MRI HEAD FINDINGS Brain: Hemorrhage in the right lateral thalamus measuring approximately 10 mm. There is central methemoglobin suggesting this is a subacute hemorrhage, possibly 58 week old. Additional areas of chronic microhemorrhage in the occipital lobe bilaterally and in the right cerebellum and brainstem bilaterally. Generalized atrophy. Patchy white matter hyperintensity bilaterally. Chronic infarct right occipital lobe. Negative for acute infarct. No enhancing metastatic deposits identified. Vascular: Normal arterial flow voids Skull and upper  cervical spine: No focal skeletal lesion. Sinuses/Orbits: Mild mucosal edema paranasal sinuses. Bilateral cataract extraction Other: None MRA HEAD FINDINGS Both vertebral arteries patent to the basilar without stenosis. The left vertebral artery is tortuous with flattening of the ventral surface of the medulla. Small PICA bilaterally. AICA patent on the left. Basilar widely patent. AICA, superior cerebellar, posterior cerebral arteries patent bilaterally without stenosis. Cavernous carotid patent bilaterally without stenosis. Anterior and middle cerebral arteries patent bilaterally without stenosis or large vessel occlusion. Negative for cerebral aneurysm. IMPRESSION: 1. 1 cm hemorrhage right lateral thalamus appears subacute with methemoglobin present. Additional areas of chronic microhemorrhage in the brain. This may be due to hypertension 2. Negative for metastatic disease. No enhancing lesions in the brain. 3. Moderate chronic microvascular ischemic change in the white matter. 4. Negative MRA head Electronically Signed   By: Marlan Palau M.D.   On: 08/08/2019 11:42   MR BRAIN W WO CONTRAST  Result Date: 08/08/2019 CLINICAL DATA:  Rule out metastatic disease. Intracranial hemorrhage on CT. Atrial fibrillation on Eliquis. EXAM: MRI HEAD WITHOUT AND WITH CONTRAST MRA HEAD WITHOUT CONTRAST TECHNIQUE: Multiplanar, multiecho pulse sequences of the brain and surrounding structures were obtained without and with intravenous contrast. Angiographic images of the head were obtained using MRA technique without contrast. CONTRAST:  12mL GADAVIST GADOBUTROL 1 MMOL/ML IV SOLN COMPARISON:  CT head 08/07/2019 FINDINGS: MRI HEAD FINDINGS Brain: Hemorrhage in the right lateral thalamus measuring approximately 10 mm. There is central methemoglobin suggesting this is a subacute hemorrhage, possibly 60 week old. Additional areas of chronic microhemorrhage in the occipital lobe bilaterally and in the right cerebellum and brainstem  bilaterally. Generalized atrophy. Patchy white matter hyperintensity bilaterally. Chronic infarct right occipital lobe. Negative for acute infarct. No enhancing metastatic deposits identified. Vascular: Normal arterial flow voids Skull and upper cervical spine: No focal skeletal lesion. Sinuses/Orbits: Mild mucosal edema paranasal sinuses. Bilateral cataract extraction Other: None MRA HEAD FINDINGS Both vertebral arteries patent to the basilar without stenosis. The left vertebral artery is tortuous with flattening of the ventral surface of the medulla. Small PICA bilaterally. AICA patent on the left. Basilar widely patent. AICA, superior cerebellar, posterior cerebral arteries patent bilaterally without stenosis. Cavernous carotid patent bilaterally without stenosis. Anterior and middle cerebral arteries patent bilaterally without stenosis or large vessel occlusion. Negative for cerebral aneurysm. IMPRESSION: 1.  1 cm hemorrhage right lateral thalamus appears subacute with methemoglobin present. Additional areas of chronic microhemorrhage in the brain. This may be due to hypertension 2. Negative for metastatic disease. No enhancing lesions in the brain. 3. Moderate chronic microvascular ischemic change in the white matter. 4. Negative MRA head Electronically Signed   By: Franchot Gallo M.D.   On: 08/08/2019 11:42   ECHOCARDIOGRAM COMPLETE  Result Date: 08/07/2019    ECHOCARDIOGRAM REPORT   Patient Name:   VYOM BRASS Date of Exam: 08/07/2019 Medical Rec #:  [id number]        Height:       68.0 in Accession #:    [id number]       Weight:       165.0 lb Date of Birth:  Jul 18, 1932        BSA:          1.883 m Patient Age:    21 years         BP:           142/101 mmHg Patient Gender: M                HR:           85 bpm. Exam Location:  Inpatient Procedure: 2D Echo, 3D Echo, Color Doppler and Cardiac Doppler Indications:    H41.74 Acute diastolic (congestive) heart failure  History:        Patient has prior  history of Echocardiogram examinations, most                 recent 10/20/2018. CHF, CAD, Pacemaker, Arrythmias:Atrial                 Fibrillation; Risk Factors:Hypertension and Sleep Apnea. COVID+                 on 11/07/18.  Sonographer:    Raquel Sarna Senior RDCS Referring Phys: [id number] [place] [facility]  Sonographer Comments: Windows limited by small rib spacing. IMPRESSIONS  1. Left ventricular ejection fraction by 3D volume is 21 %. The left ventricle has severely decreased function. The left ventricle demonstrates regional wall motion abnormalities (see scoring diagram/findings for description). There is mild left ventricular hypertrophy. Left ventricular diastolic parameters are indeterminate.  2. Right ventricular systolic function is moderately reduced. The right ventricular size is normal. There is mildly elevated pulmonary artery systolic pressure. The estimated right ventricular systolic pressure is 85.6 mmHg.  3. Left atrial size was moderately dilated.  4. Right atrial size was moderately dilated.  5. The mitral valve is degenerative. Mild mitral valve regurgitation. No evidence of mitral stenosis.  6. The aortic valve is abnormal. Aortic valve regurgitation is trivial. No aortic stenosis is present.  7. The inferior vena cava is dilated in size with <50% respiratory variability, suggesting right atrial pressure of 15 mmHg. Comparison(s): A prior study was performed on 10/20/18. Prior images reviewed side by side. LVEF has decreased. FINDINGS  Left Ventricle: Left ventricular ejection fraction by 3D volume is 21 %. The left ventricle has severely decreased function. The left ventricle demonstrates regional wall motion abnormalities. The left ventricular internal cavity size was normal in size. There is mild left ventricular hypertrophy. Left ventricular diastolic parameters are indeterminate.  LV Wall Scoring: The mid and distal lateral wall, mid anterolateral segment, mid inferoseptal segment, apical septal  segment, mid anterior segment, and apical inferior segment are akinetic. The mid anteroseptal segment, basal anterolateral segment, apical anterior segment, mid inferior segment,  and basal inferoseptal segment are hypokinetic. The basal anteroseptal segment is normal. Right Ventricle: The right ventricular size is normal. No increase in right ventricular wall thickness. Right ventricular systolic function is moderately reduced. There is mildly elevated pulmonary artery systolic pressure. The tricuspid regurgitant velocity is 2.57 m/s, and with an assumed right atrial pressure of 15 mmHg, the estimated right ventricular systolic pressure is 93.8 mmHg. Left Atrium: Left atrial size was moderately dilated. Right Atrium: Right atrial size was moderately dilated. Pericardium: There is no evidence of pericardial effusion. Mitral Valve: The mitral valve is degenerative in appearance. Mild mitral valve regurgitation. No evidence of mitral valve stenosis. Tricuspid Valve: The tricuspid valve is normal in structure. Tricuspid valve regurgitation is trivial. Aortic Valve: The aortic valve is abnormal. Aortic valve regurgitation is trivial. No aortic stenosis is present. There is severe calcifcation of the aortic valve. Pulmonic Valve: The pulmonic valve was normal in structure. Pulmonic valve regurgitation is mild. Aorta: The aortic root is normal in size and structure. Venous: The inferior vena cava is dilated in size with less than 50% respiratory variability, suggesting right atrial pressure of 15 mmHg. IAS/Shunts: No atrial level shunt detected by color flow Doppler.  LEFT VENTRICLE PLAX 2D LVIDd:         4.30 cm LVIDs:         4.10 cm LV PW:         1.20 cm         3D Volume EF LV IVS:        1.30 cm         LV 3D EF:    Left LVOT diam:     2.00 cm                      ventricular LV SV:         32                           ejection LV SV Index:   17                           fraction by LVOT Area:     3.14 cm                      3D volume                                             is 21 %.                                 3D Volume EF:                                3D EF:        21 % RIGHT VENTRICLE RV S prime:     5.36 cm/s TAPSE (M-mode): 1.3 cm LEFT ATRIUM              Index       RIGHT ATRIUM           Index LA diam:        4.60 cm  2.44 cm/m  RA Area:     25.00 cm LA Vol (A2C):   112.0 ml 59.47 ml/m RA Volume:   80.80 ml  42.90 ml/m LA Vol (A4C):   65.8 ml  34.94 ml/m LA Biplane Vol: 86.6 ml  45.98 ml/m  AORTIC VALVE LVOT Vmax:   64.40 cm/s LVOT Vmean:  42.000 cm/s LVOT VTI:    0.102 m  AORTA Ao Root diam: 3.60 cm Ao Asc diam:  3.70 cm TRICUSPID VALVE TR Peak grad:   26.4 mmHg TR Vmax:        257.00 cm/s  SHUNTS Systemic VTI:  0.10 m Systemic Diam: 2.00 cm Cherlynn Kaiser MD Electronically signed by Cherlynn Kaiser MD Signature Date/Time: 08/07/2019/12:31:38 PM    Final         Scheduled Meds: . atorvastatin  40 mg Oral QHS  . carvedilol  3.125 mg Oral BID WC  . Chlorhexidine Gluconate Cloth  6 each Topical Daily  . famotidine  20 mg Oral QHS  . feeding supplement (ENSURE ENLIVE)  237 mL Oral TID BM  . losartan  25 mg Oral Daily  . multivitamin with minerals  1 tablet Oral Daily  . sodium chloride flush  3 mL Intravenous Once  . sodium chloride flush  3 mL Intravenous Q12H   Continuous Infusions: . sodium chloride       LOS: 2 days   Time spent: 66mn  Debbora Ang C Pansie Guggisberg, DO [facility]  If 7PM-7AM, please contact night-coverage [email or web address]  08/09/2019, 7:12 AM

## 2019-08-09 NOTE — Progress Notes (Signed)
STROKE TEAM PROGRESS NOTE   INTERVAL HISTORY Patient is doing well.  Patient neurological exam remains unchanged.  Vital signs stable blood pressure adequately controlled.  MRI done yesterday shows no evidence of metastasis and stable appearance of the small intracerebral hemorrhage.  Vitals:   08/09/19 1023 08/09/19 1100 08/09/19 1206 08/09/19 1300  BP: 103/71 95/78 (!) 120/97 99/75  Pulse: 68 87  (!) 41  Resp: 15 14 16 20   Temp:   98.1 F (36.7 C)   TempSrc:      SpO2: 100% 97% 98% (!) 71%  Weight:      Height:       CBC:  Recent Labs  Lab 08/06/19 1642 08/09/19 0417  WBC 6.0 7.4  HGB 12.9* 13.7  HCT 39.2 41.1  MCV 91.6 90.5  PLT 183 354   Basic Metabolic Panel:  Recent Labs  Lab 08/07/19 0449 08/09/19 0417  NA 141 140  K 3.6 3.1*  CL 100 99  CO2 27 31  GLUCOSE 109* 108*  BUN 20 21  CREATININE 1.25* 1.42*  CALCIUM 9.5 9.1   Lipid Panel:  Recent Labs  Lab 08/09/19 0417  CHOL 120  TRIG 44  HDL 41  CHOLHDL 2.9  VLDL 9  LDLCALC 70   HgbA1c:  Recent Labs  Lab 08/09/19 0417  HGBA1C 5.9*   Urine Drug Screen: No results for input(s): LABOPIA, COCAINSCRNUR, LABBENZ, AMPHETMU, THCU, LABBARB in the last 168 hours.  Alcohol Level No results for input(s): ETH in the last 168 hours.  IMAGING past 24 hours No results found.  PHYSICAL EXAM Pleasant elderly male not in distress. . Afebrile. Head is nontraumatic. Neck is supple without bruit.    Cardiac exam no murmur or gallop. Lungs are clear to auscultation. Distal pulses are well felt. Neurological Exam : Patient is awake alert oriented to time place and person.  Slightly diminished attention, registration and recall.  Follows simple midline and 1 and occasional two-step commands.  Extraocular movements are full range without nystagmus.  Speech is clear without dysarthria or aphasia.  Blinks to threat bilaterally.  Face is symmetric without weakness.  Tongue midline.  Motor system exam shows symmetric upper and  lower extremity strength without focal weakness or drift.  Sensation is intact bilaterally coordination is slow but accurate gait not tested. ASSESSMENT/PLAN [name] is a 86 y.o. male with history of CVA with right-sided weakness, paroxysmal atrial fibrillation on Eliquis, hypertension, coronary artery disease, CHF, HCV presented with 2 to 3 weeks of fatigue, increased lower extremity edema and abdominal pain admitted on 8/1 for acute systolic heart failure.  On the morning of 8/2 patient confused. CT head showed a 10 mm left thalamic ICH. Neurology was consulted. transferred to [facility], ICU for monitoring.   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 Small vessel disease. Atrophy. Old R occipital infarct.   Repeat CT head later same day stable   MRI w/w/o R lateral thalamic ICH. Chronic microhemorrhages throughout. No mets. Small vessel disease.   MRA  negative  CT head in am pending   2D Echo EF 21%. No source of embolus> LA moderate dilated.   LDL pending   HgbA1c pending   VTE prophylaxis - SCDs   clopidogrel 75 mg daily and Eliquis (apixaban) daily prior to admission, now on No antithrombotic given hemorrhage   Therapy recommendations:  pending   Disposition:  pending  Atrial Fibrillation  Home anticoagulation:  Eliquis (apixaban) daily   Off AC given hemorrhage   Hypertension  Stable . SBP goal < 140 . Long-term BP goal normotensive  Hyperlipidemia  Home meds:  liptior 9, resumed in hospital  LDL pending, goal < 70  Recommend holding statin in setting of acute hemorrhage   Ok to resume statin at discharge  Other Stroke Risk Factors  Advanced age  Former Cigarette smoker  Hx stroke/TIA  12/2018 -  right PCA infarct, embolic, source unknown. Later developed AF found on loop. Started on Eliquis  Coronary artery disease s/p NSTEMI 10/2018 d/c on eliquis,  plavix + statin  Obstructive sleep apnea, not on CPAP at home  Acute systolic HF  Cardiomyopathy   Other Active Problems  Acute respiratory insufficiency secondary to CHF. B pleural effusions  Prostate cancer s/p OR   R 4th rib fx   AKI in setting of CKD stage II/ B low density kidney lesions  Hepatitis. Liver lesions  Elevated LFTs  Hx covid 11/2018  Hospital day # 2 Patient presented with confusion secondary to small right thalamic hemorrhage likely due to combination of small vessel disease as well as anticoagulation with Eliquis and Plavix.  Recommend   strict blood pressure control with systolic goal below  588.  Hold anticoagulation   for now but given his atrial fibrillation as well as significant coronary artery disease will resume aspirin 81 mg now and Eliquis after 2 to 4 weeks if stable.  No family available at the bedside for discussion.  Discussed with Dr. Asencion Partridge critical care medicine.  This patient is critically ill and at significant risk of neurological worsening, death and care requires constant monitoring of vital signs, hemodynamics,respiratory and cardiac monitoring, extensive review of multiple databases, frequent neurological assessment, discussion with family, other specialists and medical decision making of high complexity.I have made any additions or clarifications directly to the above note.This critical care time does not reflect procedure time, or teaching time or supervisory time of PA/NP/Med Resident etc but could involve care discussion time.  I spent 30 minutes of neurocritical care time  in the care of  this patient.    Antony Contras, MD  To contact Stroke Continuity provider, please refer to [email or web address] After hours, contact General Neurology

## 2019-08-09 NOTE — Progress Notes (Signed)
The chaplain responded to a consult for an AD. The nurse informed the chaplain that the patient was not currently able to make decisions, but believed that his current mind state would improve. The family still wanted the AD brochure to look over. The chaplain will follow-up at an appropriate time for completion of the paperwork.  Brion Aliment Chaplain Resident For questions concerning this note please contact me by pager [phone]

## 2019-08-10 LAB — COMPREHENSIVE METABOLIC PANEL
ALT: 29 U/L (ref 0–44)
AST: 15 U/L (ref 15–41)
Albumin: 2.5 g/dL — ABNORMAL LOW (ref 3.5–5.0)
Alkaline Phosphatase: 68 U/L (ref 38–126)
Anion gap: 8 (ref 5–15)
BUN: 29 mg/dL — ABNORMAL HIGH (ref 8–23)
CO2: 31 mmol/L (ref 22–32)
Calcium: 8.9 mg/dL (ref 8.9–10.3)
Chloride: 99 mmol/L (ref 98–111)
Creatinine, Ser: 1.4 mg/dL — ABNORMAL HIGH (ref 0.61–1.24)
GFR calc Af Amer: 53 mL/min — ABNORMAL LOW (ref >60)
GFR calc non Af Amer: 45 mL/min — ABNORMAL LOW (ref >60)
Glucose, Bld: 100 mg/dL — ABNORMAL HIGH (ref 70–99)
Potassium: 3.5 mmol/L (ref 3.5–5.1)
Sodium: 138 mmol/L (ref 135–145)
Total Bilirubin: 0.8 mg/dL (ref 0.3–1.2)
Total Protein: 5.5 g/dL — ABNORMAL LOW (ref 6.5–8.1)

## 2019-08-10 LAB — CBC
HCT: 37.3 % — ABNORMAL LOW (ref 39.0–52.0)
Hemoglobin: 12.4 g/dL — ABNORMAL LOW (ref 13.0–17.0)
MCH: 30.2 pg (ref 26.0–34.0)
MCHC: 33.2 g/dL (ref 30.0–36.0)
MCV: 90.8 fL (ref 80.0–100.0)
Platelets: 179 10*3/uL (ref 150–400)
RBC: 4.11 MIL/uL — ABNORMAL LOW (ref 4.22–5.81)
RDW: 14.4 % (ref 11.5–15.5)
WBC: 7.6 10*3/uL (ref 4.0–10.5)
nRBC: 0 % (ref 0.0–0.2)

## 2019-08-10 LAB — AMMONIA: Ammonia: 21 umol/L (ref 9–35)

## 2019-08-10 MED ORDER — METOPROLOL SUCCINATE ER 25 MG PO TB24
25.0000 mg | ORAL_TABLET | Freq: Two times a day (BID) | ORAL | Status: DC
  Administered 2019-08-10 – 2019-08-11 (×2): 25 mg via ORAL
  Filled 2019-08-10 (×2): qty 1

## 2019-08-10 NOTE — TOC Initial Note (Signed)
Transition of Care [facility]) - Initial/Assessment Note    Patient Details  Name: Brian Brown MRN: [id number] Date of Birth: 07-Jan-1932  Transition of [facility]) CM/SW Contact:    Brian Friar, RN Phone Number: 08/10/2019, 11:03 AM  Clinical Narrative:                 Pt with recommendations for [facility] services. CM met with the patient and then called his daughter: Brian Brown per his request. Daughters will be in town the next several days and then they will have his God son stay with him as needed.  [facility].  Daughter and pt think he has rolling walker at home.  Pt has transport home when medically ready.   Expected Discharge Plan: [place] Barriers to Discharge: Continued Medical Work up   Patient Goals and CMS Choice   CMS Medicare.gov Compare Post Acute Care list provided to:: Patient Choice offered to / list presented to : Patient, Adult Children  Expected Discharge Plan and Services Expected Discharge Plan: [place]   Discharge Planning Services: CM Consult Post Acute Care Choice: [place] arrangements for the past 2 months: Single Family Home                           HH Arranged: PT, OT [place] Agency: [facility] [employer] Date [place]: 08/10/19   Representative spoke with at [place]: Brian Brown Arrangements/Services Living arrangements for the past 2 months: [place] with:: Self Patient language and need for interpreter reviewed:: Yes Do you feel safe going back to the place where you live?: Yes      Need for Family Participation in Patient Care: Yes (Comment) Care giver support system in place?: Yes (comment) Current home services: DME (rollator and walker) Criminal Activity/Legal Involvement Pertinent to Current Situation/Hospitalization: No - Comment as needed  Activities of Daily Living Home Assistive  Devices/Equipment: Dentures (specify type), Eyeglasses, CPAP (upper denture, has CPAP but does not use it) ADL Screening (condition at time of admission) Patient's cognitive ability adequate to safely complete daily activities?: Yes Is the patient deaf or have difficulty hearing?: No Does the patient have difficulty seeing, even when wearing glasses/contacts?: Yes Does the patient have difficulty concentrating, remembering, or making decisions?: No Patient able to express need for assistance with ADLs?: Yes Does the patient have difficulty dressing or bathing?: Yes Independently performs ADLs?: No Communication: Independent Dressing (OT): Independent Grooming: Independent Feeding: Independent Bathing: Independent Toileting: Needs assistance Is this a change from baseline?: Change from baseline, expected to last >3days In/Out Bed: Needs assistance Is this a change from baseline?: Change from baseline, expected to last >3 days Walks in Home: Needs assistance Is this a change from baseline?: Change from baseline, expected to last >3 days Does the patient have difficulty walking or climbing stairs?: Yes (secondary to weakness and foot pain) Weakness of Legs: Right Weakness of Arms/Hands: Right  Permission Sought/Granted                  Emotional Assessment Appearance:: Appears stated age Attitude/Demeanor/Rapport: Engaged Affect (typically observed): Accepting Orientation: : Oriented to Self, Oriented to Place, Oriented to  Time, Oriented to Situation   Psych Involvement: No (comment)  Admission diagnosis:  Intracerebral hemorrhage (HCC) [I61.9] Acute CHF (congestive heart failure) (HCC) [I50.9] Fatigue, unspecified  type [R53.83] Acute congestive heart failure, unspecified heart failure type [facility]) [I50.9] Patient Active Problem List   Diagnosis Date Noted  . Protein-calorie malnutrition, severe 08/08/2019  . Intracerebral hemorrhage ([place]) 08/07/2019  . Acute CHF  (congestive heart failure) ([place]) 08/06/2019  . AKI (acute kidney injury) ([place]) 08/06/2019  . Elevated troponin 08/06/2019  . Transaminitis 08/06/2019  . Closed rib fracture 08/06/2019  . History of CVA (cerebrovascular accident) 08/06/2019  . PAF (paroxysmal atrial fibrillation) ([place]) 10/20/2018  . NSTEMI (non-ST elevated myocardial infarction) ([place])   . Acute CVA (cerebrovascular accident) ([place]) 02/14/2018  . TIA (transient ischemic attack) 02/13/2018  . Hepatitis C reactive 02/06/2011  . Hyponatremia 02/06/2011  . Postoperative anemia due to acute blood loss 02/05/2011  . Hypokalemia 02/05/2011  . Osteoarthritis of hip 01/27/2011  . Hypertension 01/27/2011  . Sleep apnea 01/27/2011  . Stroke/cerebrovascular accident ([place]) 01/27/2011  . Cancer of prostate ([place]) 01/27/2011   PCP:  Brian Carol, MD Pharmacy:   [id number] [place]Phone: [phone]: [phone] [facility] [place]Phone: [phone]: [phone] [facility] [place] [address]Phone: [phone]: [phone]    Social Determinants of Health (SDOH) Interventions    Readmission Risk Interventions No flowsheet data found.

## 2019-08-10 NOTE — Progress Notes (Signed)
Physical Therapy Treatment Patient Details Name: Brian Brown MRN: [id number] DOB: May 10, 1932 Today's Date: 08/10/2019    History of Present Illness 86 y.o. male with medical history significant for CVA, CAD s/p NSTEMI, cardiomyopathy, paroxysmal atrial fibrillation on Eliquis, OSA not on CPAP, hypertension, hepatitis C who presents with concerns of worsening fatigue. 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    PT Comments    Pt in bed upon arrival of PT, agreeable to session with focus on stair training prior to anticipated d/c home tomorrow. The pt was able to demo improvements in ambulation distance and good safety with stair navigation, but continues to demo a slow gait pattern with short stride lengths and minimal clearance. The pt will continue to benefit from skilled PT to progress functional ambulation and stability with gait to allow for a full return to prior level of function and activity such as walking his 80lb dog. The pt was able to safely navigate x3 steps with minG for safety and use of 2 rails, and will be safe to return home with 24/7 supervision by daughters when medically ready for d/c.   Follow Up Recommendations  Home health PT;Supervision/Assistance - 24 hour     Equipment Recommendations  None recommended by PT (pt needs a RW, family states they have one in attic)    Recommendations for Other Services       Precautions / Restrictions Precautions Precautions: Fall Restrictions Weight Bearing Restrictions: No    Mobility  Bed Mobility Overal bed mobility: Needs Assistance Bed Mobility: Supine to Sit;Sit to Supine     Supine to sit: Supervision Sit to supine: Supervision   General bed mobility comments: no physical assist needed, even from flat bed. Pt repositioned independently  Transfers Overall transfer level: Needs assistance Equipment used: Rolling walker (2 wheeled) Transfers: Sit to/from Stand Sit  to Stand: Min guard         General transfer comment: minG for safety, no physical assist needed to stand. extra time and VCs for hand placement  Ambulation/Gait Ambulation/Gait assistance: Min guard Gait Distance (Feet): 100 Feet Assistive device: Rolling walker (2 wheeled) Gait Pattern/deviations: Step-to pattern;Decreased dorsiflexion - right;Decreased stride length;Shuffle Gait velocity: 0.3 m/s Gait velocity interpretation: <1.31 ft/sec, indicative of household ambulator General Gait Details: pt with some R foot drag noted with fatigue, otherwise reduced stride length, clearance, and slowed gait speed   Stairs Stairs: Yes Stairs assistance: Min guard Stair Management: Forwards;Two rails;One rail Left;Alternating pattern Number of Stairs: 3 (x2) General stair comments: The pt initially used 2 rails with alternating pattern ascending and 2 rails with step-to pattern descending. when asked to use only 1 rail he used step-to pattern for both.   Wheelchair Mobility    Modified Rankin (Stroke Patients Only) Modified Rankin (Stroke Patients Only) Pre-Morbid Rankin Score: No symptoms Modified Rankin: Moderately severe disability     Balance Overall balance assessment: Needs assistance Sitting-balance support: No upper extremity supported;Feet supported Sitting balance-Leahy Scale: Good     Standing balance support: No upper extremity supported;During functional activity Standing balance-Leahy Scale: Fair Standing balance comment: RW for ambulation, able to stand at sink with reaching outside his BOS without UE support                            Cognition Arousal/Alertness: Awake/alert Behavior During Therapy: WFL for tasks assessed/performed Overall Cognitive Status: Within Functional Limits for  tasks assessed                                 General Comments: Pt generally functional conversationally today, able to identify family members and  answer questions appropriately. Slightly slowed processing      Exercises      General Comments General comments (skin integrity, edema, etc.): VSS, discussed stair training with the pt and his family      Pertinent Vitals/Pain Pain Assessment: No/denies pain           PT Goals (current goals can now be found in the care plan section) Acute Rehab PT Goals Patient Stated Goal: to go home and be with his dog  PT Goal Formulation: With patient Time For Goal Achievement: 08/23/19 Potential to Achieve Goals: Good Progress towards PT goals: Progressing toward goals    Frequency    Min 4X/week      PT Plan Current plan remains appropriate       AM-PAC PT "6 Clicks" Mobility   Outcome Measure  Help needed turning from your back to your side while in a flat bed without using bedrails?: None Help needed moving from lying on your back to sitting on the side of a flat bed without using bedrails?: A Little Help needed moving to and from a bed to a chair (including a wheelchair)?: A Little Help needed standing up from a chair using your arms (e.g., wheelchair or bedside chair)?: A Little Help needed to walk in hospital room?: A Little Help needed climbing 3-5 steps with a railing? : A Little 6 Click Score: 19    End of Session Equipment Utilized During Treatment: Gait belt Activity Tolerance: Patient tolerated treatment well Patient left: with call bell/phone within reach;in bed;with family/visitor present Nurse Communication: Mobility status PT Visit Diagnosis: Other abnormalities of gait and mobility (R26.89);Muscle weakness (generalized) (M62.81)     Time: 7591-6384 PT Time Calculation (min) (ACUTE ONLY): 35 min  Charges:  $Gait Training: 23-37 mins                     Karma Ganja, PT, DPT   Acute Rehabilitation Department Pager #: [phone]   Otho Bellows 08/10/2019, 4:46 PM

## 2019-08-10 NOTE — Progress Notes (Signed)
Progress Note  Patient Name: Brian Brown Date of Encounter: 08/10/2019  [facility] Cardiologist: Larae Grooms, MD   Subjective   Breathing is OK  No CP  No dizziness   Inpatient Medications    Scheduled Meds: . aspirin EC  81 mg Oral Daily  . atorvastatin  40 mg Oral QHS  . carvedilol  3.125 mg Oral BID WC  . Chlorhexidine Gluconate Cloth  6 each Topical Daily  . famotidine  20 mg Oral QHS  . feeding supplement (ENSURE ENLIVE)  237 mL Oral TID BM  . losartan  25 mg Oral Daily  . multivitamin with minerals  1 tablet Oral Daily  . sodium chloride flush  3 mL Intravenous Once  . sodium chloride flush  3 mL Intravenous Q12H   Continuous Infusions: . sodium chloride     PRN Meds: sodium chloride, acetaminophen, docusate sodium, ondansetron (ZOFRAN) IV, polyethylene glycol, sodium chloride flush   Vital Signs    Vitals:   08/09/19 2300 08/10/19 0331 08/10/19 0500 08/10/19 0833  BP: 100/67 93/62  100/63  Pulse: 68 78  87  Resp: 18 18  20   Temp: 97.9 F (36.6 C) 98 F (36.7 C)  98.3 F (36.8 C)  TempSrc: Oral Oral  Oral  SpO2: 99% 96%  98%  Weight:   69.9 kg   Height:        Intake/Output Summary (Last 24 hours) at 08/10/2019 0910 Last data filed at 08/10/2019 0835 Gross per 24 hour  Intake 1317 ml  Output 250 ml  Net 1067 ml   Net neg 2.8  L   Last 3 Weights 08/10/2019 08/09/2019 08/08/2019  Weight (lbs) 154 lb 3.2 oz 153 lb 7 oz 159 lb 9.8 oz  Weight (kg) 69.945 kg 69.6 kg 72.4 kg      Telemetry    Afib  80s    - Personally Reviewed  ECG    No new  - Personally Reviewed  Physical Exam   GEN: No acute distress.   Neck:  JVP is not elevated   Cardiac: Irreg irreg  , no murmurs Respiratory: Clear to auscultation bilaterally.anteirorly  GI: Soft, nontender, non-distended  MS: No LE edema; No  Labs    High Sensitivity Troponin:   Recent Labs  Lab 08/06/19 1654 08/06/19 1854  TROPONINIHS 35* 35*      Chemistry Recent Labs  Lab  08/06/19 1642 08/06/19 1642 08/07/19 0449 08/09/19 0417 08/10/19 0214  NA 142   < > 141 140 138  K 3.4*   < > 3.6 3.1* 3.5  CL 106   < > 100 99 99  CO2 25   < > 27 31 31   GLUCOSE 123*   < > 109* 108* 100*  BUN 25*   < > 20 21 29*  CREATININE 1.43*   < > 1.25* 1.42* 1.40*  CALCIUM 8.7*   < > 9.5 9.1 8.9  PROT 6.8  --   --   --  5.5*  ALBUMIN 3.4*  --   --   --  2.5*  AST 53*  --   --   --  15  ALT 65*  --   --   --  29  ALKPHOS 89  --   --   --  68  BILITOT 1.5*  --   --   --  0.8  GFRNONAA 44*   < > 52* 45* 45*  GFRAA 51*   < > >60  52* 53*  ANIONGAP 11   < > 14 10 8    < > = values in this interval not displayed.     Hematology Recent Labs  Lab 08/06/19 1642 08/09/19 0417 08/10/19 0214  WBC 6.0 7.4 7.6  RBC 4.28 4.54 4.11*  HGB 12.9* 13.7 12.4*  HCT 39.2 41.1 37.3*  MCV 91.6 90.5 90.8  MCH 30.1 30.2 30.2  MCHC 32.9 33.3 33.2  RDW 14.9 14.6 14.4  PLT 183 190 179    BNP Recent Labs  Lab 08/06/19 1642  BNP 1,349.7*     DDimer No results for input(s): DDIMER in the last 168 hours.   Radiology    MR ANGIO HEAD WO CONTRAST  Result Date: 08/08/2019 CLINICAL DATA:  Rule out metastatic disease. Intracranial hemorrhage on CT. Atrial fibrillation on Eliquis. EXAM: MRI HEAD WITHOUT AND WITH CONTRAST MRA HEAD WITHOUT CONTRAST TECHNIQUE: Multiplanar, multiecho pulse sequences of the brain and surrounding structures were obtained without and with intravenous contrast. Angiographic images of the head were obtained using MRA technique without contrast. CONTRAST:  68mL GADAVIST GADOBUTROL 1 MMOL/ML IV SOLN COMPARISON:  CT head 08/07/2019 FINDINGS: MRI HEAD FINDINGS Brain: Hemorrhage in the right lateral thalamus measuring approximately 10 mm. There is central methemoglobin suggesting this is a subacute hemorrhage, possibly 59 week old. Additional areas of chronic microhemorrhage in the occipital lobe bilaterally and in the right cerebellum and brainstem bilaterally. Generalized  atrophy. Patchy white matter hyperintensity bilaterally. Chronic infarct right occipital lobe. Negative for acute infarct. No enhancing metastatic deposits identified. Vascular: Normal arterial flow voids Skull and upper cervical spine: No focal skeletal lesion. Sinuses/Orbits: Mild mucosal edema paranasal sinuses. Bilateral cataract extraction Other: None MRA HEAD FINDINGS Both vertebral arteries patent to the basilar without stenosis. The left vertebral artery is tortuous with flattening of the ventral surface of the medulla. Small PICA bilaterally. AICA patent on the left. Basilar widely patent. AICA, superior cerebellar, posterior cerebral arteries patent bilaterally without stenosis. Cavernous carotid patent bilaterally without stenosis. Anterior and middle cerebral arteries patent bilaterally without stenosis or large vessel occlusion. Negative for cerebral aneurysm. IMPRESSION: 1. 1 cm hemorrhage right lateral thalamus appears subacute with methemoglobin present. Additional areas of chronic microhemorrhage in the brain. This may be due to hypertension 2. Negative for metastatic disease. No enhancing lesions in the brain. 3. Moderate chronic microvascular ischemic change in the white matter. 4. Negative MRA head Electronically Signed   By: Franchot Gallo M.D.   On: 08/08/2019 11:42   MR BRAIN W WO CONTRAST  Result Date: 08/08/2019 CLINICAL DATA:  Rule out metastatic disease. Intracranial hemorrhage on CT. Atrial fibrillation on Eliquis. EXAM: MRI HEAD WITHOUT AND WITH CONTRAST MRA HEAD WITHOUT CONTRAST TECHNIQUE: Multiplanar, multiecho pulse sequences of the brain and surrounding structures were obtained without and with intravenous contrast. Angiographic images of the head were obtained using MRA technique without contrast. CONTRAST:  33mL GADAVIST GADOBUTROL 1 MMOL/ML IV SOLN COMPARISON:  CT head 08/07/2019 FINDINGS: MRI HEAD FINDINGS Brain: Hemorrhage in the right lateral thalamus measuring approximately  10 mm. There is central methemoglobin suggesting this is a subacute hemorrhage, possibly 24 week old. Additional areas of chronic microhemorrhage in the occipital lobe bilaterally and in the right cerebellum and brainstem bilaterally. Generalized atrophy. Patchy white matter hyperintensity bilaterally. Chronic infarct right occipital lobe. Negative for acute infarct. No enhancing metastatic deposits identified. Vascular: Normal arterial flow voids Skull and upper cervical spine: No focal skeletal lesion. Sinuses/Orbits: Mild mucosal edema paranasal sinuses. Bilateral cataract extraction  Other: None MRA HEAD FINDINGS Both vertebral arteries patent to the basilar without stenosis. The left vertebral artery is tortuous with flattening of the ventral surface of the medulla. Small PICA bilaterally. AICA patent on the left. Basilar widely patent. AICA, superior cerebellar, posterior cerebral arteries patent bilaterally without stenosis. Cavernous carotid patent bilaterally without stenosis. Anterior and middle cerebral arteries patent bilaterally without stenosis or large vessel occlusion. Negative for cerebral aneurysm. IMPRESSION: 1. 1 cm hemorrhage right lateral thalamus appears subacute with methemoglobin present. Additional areas of chronic microhemorrhage in the brain. This may be due to hypertension 2. Negative for metastatic disease. No enhancing lesions in the brain. 3. Moderate chronic microvascular ischemic change in the white matter. 4. Negative MRA head Electronically Signed   By: Franchot Gallo M.D.   On: 08/08/2019 11:42    Cardiac Studies     Cath: 10/19/18    The left ventricular systolic function is normal.  LV end diastolic pressure is normal.  The left ventricular ejection fraction is 55-65% by visual estimate.  Prox RCA lesion is 30% stenosed.  Dist RCA lesion is 20% stenosed.  Ost Cx to Prox Cx lesion is 50% stenosed.  Prox LAD to Mid LAD lesion is 60% stenosed.  1st Diag lesion  is 90% stenosed.  Dist LAD lesion is 30% stenosed.  2nd Mrg lesion is 90% stenosed.   1.  Severe one-vessel coronary artery disease with subtotal thrombotic occlusion of the distal OM2 at the trifurcation of 3 terminal branches.  The vessel in this area is 1.5 mm in diameter and at a trifurcation and thus not suitable for PCI.  The vessel is also very tortuous.  There is also moderate ostial left circumflex and proximal LAD disease. 2.  Normal LV systolic function and left ventricular end-diastolic pressure.   Recommendations: Recommend aggressive medical therapy.  Eliquis can be resumed tonight.  I favor that he continues to take an antiplatelet medication given this thrombotic event.  He seems to be already on Plavix which should be continued."   Echocardiogram 08/07/19:   1. Left ventricular ejection fraction by 3D volume is 21 %. The left  ventricle has severely decreased function. The left ventricle demonstrates  regional wall motion abnormalities (see scoring diagram/findings for  description). There is mild left  ventricular hypertrophy. Left ventricular diastolic parameters are  indeterminate.   2. Right ventricular systolic function is moderately reduced. The right  ventricular size is normal. There is mildly elevated pulmonary artery  systolic pressure. The estimated right ventricular systolic pressure is  56.3 mmHg.   3. Left atrial size was moderately dilated.   4. Right atrial size was moderately dilated.   5. The mitral valve is degenerative. Mild mitral valve regurgitation. No  evidence of mitral stenosis.   6. The aortic valve is abnormal. Aortic valve regurgitation is trivial.  No aortic stenosis is present.   7. The inferior vena cava is dilated in size with <50% respiratory  variability, suggesting right atrial pressure of 15 mmHg.   Comparison(s): A prior study was performed on 10/20/18. Prior images  reviewed side by side. LVEF has decreased.     Patient  Profile     Brian Brown is a 86 y.o. male with a hx of CVA 02/2018 with PAF confirmed on loop recorder on Eliquis, obstructive sleep apnea (not on CPAP), CAD with NSTEMI treated medically as outlined below, mild cardiomyopathy (EF 45-50%), hypertension, hyperlipidemia, CKD II, mild dilation of ascending aorta, and prostate CA  who is being seen today for the evaluation of CHF at the request of Dr. Valeta Harms.  Assessment & Plan    1  Acute on chronic systolic CHF    Volume status is OK   LV function is significant down from previous (echo above)  He is on carvedilol and losartan  BP low    I would switch to Toprol XL  Follow BP  Low Na diet   2  Afib   Rate remain controlled  Not a candidate for anticoag now with ICH  3  CAD   Last cath in Oct 2020   Severe 1 V CAD with subtotal thrombotic occluse of distal OM   Pt asymptomatic   Continue ASA   4  HTN BP 95 to 117/   Pt was on metoprolol tid prior to admit  Would switch  From Coreg to Toprol XL   May get adequate HR control without so low a BP   For questions or updates, please contact [facility] Please consult [email or web address] for contact info under        Signed, Dorris Carnes, MD  08/10/2019, 9:10 AM

## 2019-08-10 NOTE — Progress Notes (Signed)
STROKE TEAM PROGRESS NOTE   INTERVAL HISTORY Patient is doing well.  Patient neurological exam shows partial left homonymous hemianopsia.  Vital signs stable blood pressure adequately controlled.  He has been started on aspirin.  He has no complaints today.  Vitals:   08/10/19 0331 08/10/19 0500 08/10/19 0833 08/10/19 1316  BP: 93/62  100/63 93/77  Pulse: 78  87 70  Resp: 18  20 20   Temp: 98 F (36.7 C)  98.3 F (36.8 C) 97.9 F (36.6 C)  TempSrc: Oral  Oral Oral  SpO2: 96%  98% 98%  Weight:  69.9 kg    Height:       CBC:  Recent Labs  Lab 08/09/19 0417 08/10/19 0214  WBC 7.4 7.6  HGB 13.7 12.4*  HCT 41.1 37.3*  MCV 90.5 90.8  PLT 190 262   Basic Metabolic Panel:  Recent Labs  Lab 08/09/19 0417 08/10/19 0214  NA 140 138  K 3.1* 3.5  CL 99 99  CO2 31 31  GLUCOSE 108* 100*  BUN 21 29*  CREATININE 1.42* 1.40*  CALCIUM 9.1 8.9   Lipid Panel:  Recent Labs  Lab 08/09/19 0417  CHOL 120  TRIG 44  HDL 41  CHOLHDL 2.9  VLDL 9  LDLCALC 70   HgbA1c:  Recent Labs  Lab 08/09/19 0417  HGBA1C 5.9*   Urine Drug Screen: No results for input(s): LABOPIA, COCAINSCRNUR, LABBENZ, AMPHETMU, THCU, LABBARB in the last 168 hours.  Alcohol Level No results for input(s): ETH in the last 168 hours.  IMAGING past 24 hours No results found.  PHYSICAL EXAM Pleasant elderly male not in distress. . Afebrile. Head is nontraumatic. Neck is supple without bruit.    Cardiac exam no murmur or gallop. Lungs are clear to auscultation. Distal pulses are well felt. Neurological Exam : Patient is awake alert oriented to time place and person.  Slightly diminished attention, registration and recall.  Follows simple midline and 1 and occasional two-step commands.  Extraocular movements are full range without nystagmus.  Speech is clear without dysarthria or aphasia.  Partial left homonymous hemianopsia face is symmetric without weakness.  Tongue midline.  Motor system exam shows symmetric  upper and lower extremity strength without focal weakness or drift.  Sensation is intact bilaterally coordination is slow but accurate gait not tested. ASSESSMENT/PLAN Brian Brown is a 86 y.o. male with history of CVA with right-sided weakness, paroxysmal atrial fibrillation on Eliquis, hypertension, coronary artery disease, CHF, HCV presented with 2 to 3 weeks of fatigue, increased lower extremity edema and abdominal pain admitted on 8/1 for acute systolic heart failure.  On the morning of 8/2 patient confused. CT head showed a 10 mm left thalamic ICH. Neurology was consulted. transferred to [facility], ICU for monitoring.   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 Small vessel disease. Atrophy. Old R occipital infarct.   Repeat CT head later same day stable   MRI w/w/o R lateral thalamic ICH. Chronic microhemorrhages throughout. No mets. Small vessel disease.   MRA  negative  CT head in am pending   2D Echo EF 21%. No source of embolus> LA moderate dilated.   LDL pending   HgbA1c pending   VTE prophylaxis - SCDs   clopidogrel 75 mg daily and Eliquis (apixaban) daily prior to admission, now on No antithrombotic given hemorrhage   Therapy recommendations:  pending   Disposition:  pending   Atrial Fibrillation  Home anticoagulation:  Eliquis (apixaban) daily   Off AC given hemorrhage   Hypertension  Stable . SBP goal < 140 . Long-term BP goal normotensive  Hyperlipidemia  Home meds:  liptior 67, resumed in hospital  LDL pending, goal < 70  Recommend holding statin in setting of acute hemorrhage   Ok to resume statin at discharge  Other Stroke Risk Factors  Advanced age  Former Cigarette smoker  Hx stroke/TIA  12/2018 -  right PCA infarct, embolic, source unknown. Later developed AF found on loop. Started on Eliquis  Coronary artery disease s/p NSTEMI 10/2018 d/c on  eliquis, plavix + statin  Obstructive sleep apnea, not on CPAP at home  Acute systolic HF  Cardiomyopathy   Other Active Problems  Acute respiratory insufficiency secondary to CHF. B pleural effusions  Prostate cancer s/p OR   R 4th rib fx   AKI in setting of CKD stage II/ B low density kidney lesions  Hepatitis. Liver lesions  Elevated LFTs  Hx covid 11/2018  Hospital day # 3 Patient presented with confusion secondary to small right thalamic hemorrhage likely due to combination of small vessel disease as well as anticoagulation with Eliquis and Plavix.  Recommend   strict blood pressure control with systolic goal below  159.  Hold anticoagulation   for now but given his atrial fibrillation as well as significant coronary artery disease will resume aspirin 81 mg now and Eliquis after 2 to 4 weeks if stable.  Continue aspirin discussed with Dr. Avon Gully.  Follow-up as an outpatient stroke clinic in 6 weeks.  Stroke team will sign off.  Kindly call for questions.  Greater than 50% time during this 25-minute visit were spent on counseling and coordination of care and discussion with care team about his intracerebral hemorrhage and atrial fibrillation and stroke risk and risk benefit of anticoagulation   Antony Contras, MD  To contact Stroke Continuity provider, please refer to [email or web address] After hours, contact General Neurology

## 2019-08-10 NOTE — Progress Notes (Signed)
PROGRESS NOTE    Brian Brown  OVF:[id number] DOB: Dec 01, 1932 DOA: 08/06/2019 PCP: Seward Carol, MD   Brief Narrative:  Brian Brown is a 86 y.o. male with medical history significant for CVA w/ R sided weakness, CAD s/p NSTEMI, cardiomyopathy, paroxysmal atrial fibrillation on Eliquis, OSA not on CPAP, hypertension, hepatitis C who presents with concerns of worsening fatigue. For the past month he has felt progressive fatigue.  He usually walks his dog about twice a day but for the past 2 weeks he could no longer do that.  He felt short of breath just doing dishes or making his bed.  He also had a fall last week taking trash out and felt like his "hips slipped out from under him."  Denies any lightheadedness or loss of consciousness.  He has also been having on and off lower extremity edema with the right worse than the left ever since he had a stroke last year.  However in the past 2 to 3 days he noticed that both legs have been significantly worse.  He takes Lasix daily.He also is on Eliquis for paroxysmal atrial fibrillation denies any missed doses. Otherwise he denies chest pain, orthopnea, nausea vomiting or diarrhea. Pt lives alone but has family that checks on him on a regarding basis.He was afebrile and hypertensive with systolics of 841Y over 606T.  Troponin mildly elevated at 35 although generally run in the 50s in the past.  BNP of 1249.  K of 3.4.  Creatinine of 1.43 elevated from 1.15 from several months ago.  Mildly elevated AST of 53, ALT of 65, total bilirubin of 1.5. Chest x-ray shows mild pulmonary vascular congestion and trace right pleural effusion. CTA shows equivocal tiny filling defect in posterior right lower lobe.  Small bilateral pleural effusion.  Nondisplaced lateral right fourth rib fracture   Assessment & Plan:   Active Problems:   Hypertension   PAF (paroxysmal atrial fibrillation) (HCC)   Acute CHF (congestive heart failure) (HCC)   AKI (acute kidney  injury) (HCC)   Elevated troponin   Transaminitis   Closed rib fracture   History of CVA (cerebrovascular accident)   Intracerebral hemorrhage (HCC)   Protein-calorie malnutrition, severe   Acute Systolic Heart Failure with decompensation, improving, POA - ECHO on 8/2 shows reduced LVEF of 21% with global hypokinesis - Previous echo and 2020 showed LVEF of 45-50%.  Unclear if acutely decompensated or this is his new baseline. - Cardiology following, appreciate insight and recommendations  - Patient's blood pressure medications continues to be titrated per cardiology, previous episodes of hypotension appear to be resolving - Lengthy discussion about need for salt and fluid restrictions in the setting of worsening heart failure, defer to cardiology for need for diuretics  Acute Respiratory Distress without hypoxia secondary to Above, POA Bilateral Pleural Effusions, resolved - Continues to saturate well on room air - Appears to have completed diuresis per recent medication changes  Right lateral thalamic ICH, on eliquis, holding - Neurology following, appreciate insight and recommendations - Continue strict blood pressure control - Asa 81 only for 2 weeks - repeat CT head in 2-6 weeks as scheduled to follow hemorrhage - could consider restarting Eliquis at that time.  Neurology recommends against aspirin and Plavix while on full dose anticoagulation.  Acute metabolic encephalopathy, POA, likely multifactorial, improving Concern for baseline dementia, previously undiagnosed Rule out delirium/sundowning - Patient much more awake alert this morning - Family indicates patient is "sharp" as he lives alone and  is able to care for himself. - Unclear if patient's above small thalamic cerebral hemorrhage is large enough to cause mental status changes, certainly cannot rule out polypharmacy, delirium sundowning or worsening dementia - Patient's mental status continues to improve, family to  evaluate today, currently in route to see the patient  HTN, essential - Goal SBP < 140 and DBP < 90 in setting of ICH Plan: - Carvedilol held overnight due to hypotension, may need to decrease patient's medication burden as above  Right 4th rib fx - Non-operative; Tylenol 650 mg Q4H PRN, pain well controlled at this time  History of CVA w/ r sided deficits, POA - Baseline: residual right sided weakness - See above acute ICH for management  HLD - Continue home statin  Acute kidney injury in setting of CKD, stage II stable Bilateral low density kidney lesions - Kidney ultrasound 2014: right and left kidney with multiple renal cysts - Baseline creatinine: 1.1-1.4  Lab Results  Component Value Date   CREATININE 1.40 (H) 08/10/2019   CREATININE 1.42 (H) 08/09/2019   CREATININE 1.25 (H) 08/07/2019    Elevated LFTs, mild - Suspect multifactorial with liver lesions, known history of Hepatitis C Virus, and hepatic congestion in setting of heart failure. -Patient would likely benefit from repeat imaging in 3 to 6 months to further evaluate Hepatic Function Latest Ref Rng & Units 08/10/2019 08/06/2019 02/13/2018  Total Protein 6.5 - 8.1 g/dL 5.5(L) 6.8 6.6  Albumin 3.5 - 5.0 g/dL 2.5(L) 3.4(L) 3.2(L)  AST 15 - 41 U/L 15 53(H) 13(L)  ALT 0 - 44 U/L 29 65(H) 15  Alk Phosphatase 38 - 126 U/L 68 89 64  Total Bilirubin 0.3 - 1.2 mg/dL 0.8 1.5(H) 0.8   DVT prophylaxis: SCD use only in the setting of intracranial hemorrhage as above Code Status: Full Family Communication: None present  Status is: Inpatient  Dispo: The patient is from: Home              Anticipated d/c is to: To be determined              Anticipated d/c date is: 24 to 48 hours              Patient currently not medically stable for discharge due to ongoing need for close monitoring, further work-up and evaluation in the setting of intracranial hemorrhage, hypotension, mental status changes from baseline and need for  disposition planning.  Consultants:   PCCM, neurology, cardiology  Antimicrobials:  None indicated  Subjective: No acute issues or events overnight, much more awake alert and sharp this morning than previously, denies headache, fevers, chills, nausea, vomiting, diarrhea, constipation.  Objective: Vitals:   08/09/19 1955 08/09/19 2300 08/10/19 0331 08/10/19 0500  BP: 101/77 100/67 93/62   Pulse: 89 68 78   Resp: '16 18 18   '$ Temp: 97.9 F (36.6 C) 97.9 F (36.6 C) 98 F (36.7 C)   TempSrc: Oral Oral Oral   SpO2: 100% 99% 96%   Weight:    69.9 kg  Height:        Intake/Output Summary (Last 24 hours) at 08/10/2019 0754 Last data filed at 08/09/2019 2030 Gross per 24 hour  Intake 963 ml  Output 450 ml  Net 513 ml   Filed Weights   08/08/19 0250 08/09/19 0404 08/10/19 0500  Weight: 72.4 kg 69.6 kg 69.9 kg    Examination:  General:  Pleasantly resting in bed, No acute distress.  Alert to person and  time only. HEENT:  Normocephalic atraumatic.  Sclerae nonicteric, noninjected.  Extraocular movements intact bilaterally. Neck:  Without mass or deformity.  Trachea is midline. Lungs:  Clear to auscultate bilaterally without rhonchi, wheeze, or rales. Heart:  Regular rate and rhythm.  Without murmurs, rubs, or gallops. Abdomen:  Soft, nontender, nondistended.  Without guarding or rebound. Extremities: Without cyanosis, clubbing, edema, or obvious deformity. Vascular:  Dorsalis pedis and posterior tibial pulses palpable bilaterally. Skin:  Warm and dry, no erythema, no ulcerations.     Data Reviewed: I have personally reviewed following labs and imaging studies  CBC: Recent Labs  Lab 08/06/19 1642 08/09/19 0417 08/10/19 0214  WBC 6.0 7.4 7.6  HGB 12.9* 13.7 12.4*  HCT 39.2 41.1 37.3*  MCV 91.6 90.5 90.8  PLT 183 190 008   Basic Metabolic Panel: Recent Labs  Lab 08/06/19 1642 08/07/19 0449 08/09/19 0417 08/10/19 0214  NA 142 141 140 138  K 3.4* 3.6 3.1* 3.5    CL 106 100 99 99  CO2 '25 27 31 31  '$ GLUCOSE 123* 109* 108* 100*  BUN 25* 20 21 29*  CREATININE 1.43* 1.25* 1.42* 1.40*  CALCIUM 8.7* 9.5 9.1 8.9   GFR: Estimated Creatinine Clearance: 37.3 mL/min (A) (by C-G formula based on SCr of 1.4 mg/dL (H)). Liver Function Tests: Recent Labs  Lab 08/06/19 1642 08/10/19 0214  AST 53* 15  ALT 65* 29  ALKPHOS 89 68  BILITOT 1.5* 0.8  PROT 6.8 5.5*  ALBUMIN 3.4* 2.5*   No results for input(s): LIPASE, AMYLASE in the last 168 hours. Recent Labs  Lab 08/09/19 1506 08/10/19 0214  AMMONIA 46* 21   Coagulation Profile: No results for input(s): INR, PROTIME in the last 168 hours. Cardiac Enzymes: No results for input(s): CKTOTAL, CKMB, CKMBINDEX, TROPONINI in the last 168 hours. BNP (last 3 results) No results for input(s): PROBNP in the last 8760 hours. HbA1C: Recent Labs    08/09/19 0417  HGBA1C 5.9*   CBG: No results for input(s): GLUCAP in the last 168 hours. Lipid Profile: Recent Labs    08/09/19 0417  CHOL 120  HDL 41  LDLCALC 70  TRIG 44  CHOLHDL 2.9   Thyroid Function Tests: No results for input(s): TSH, T4TOTAL, FREET4, T3FREE, THYROIDAB in the last 72 hours. Anemia Panel: No results for input(s): VITAMINB12, FOLATE, FERRITIN, TIBC, IRON, RETICCTPCT in the last 72 hours. Sepsis Labs: No results for input(s): PROCALCITON, LATICACIDVEN in the last 168 hours.  Recent Results (from the past 240 hour(s))  SARS Coronavirus 2 by RT PCR (hospital order, performed in [facility] hospital lab) Nasopharyngeal Urine, Clean Catch     Status: None   Collection Time: 08/06/19  7:50 PM   Specimen: Urine, Clean Catch; Nasopharyngeal  Result Value Ref Range Status   SARS Coronavirus 2 NEGATIVE NEGATIVE Final    Comment: (NOTE) SARS-CoV-2 target nucleic acids are NOT DETECTED.  The SARS-CoV-2 RNA is generally detectable in upper and lower respiratory specimens during the acute phase of infection. The lowest concentration of  SARS-CoV-2 viral copies this assay can detect is 250 copies / mL. A negative result does not preclude SARS-CoV-2 infection and should not be used as the sole basis for treatment or other patient management decisions.  A negative result may occur with improper specimen collection / handling, submission of specimen other than nasopharyngeal swab, presence of viral mutation(s) within the areas targeted by this assay, and inadequate number of viral copies (<250 copies / mL). A negative  result must be combined with clinical observations, patient history, and epidemiological information.  Fact Sheet for Patients:   StrictlyIdeas.no  Fact Sheet for Healthcare Providers: BankingDealers.co.za  This test is not yet approved or  cleared by the [place] FDA and has been authorized for detection and/or diagnosis of SARS-CoV-2 by FDA under an Emergency Use Authorization (EUA).  This EUA will remain in effect (meaning this test can be used) for the duration of the COVID-19 declaration under Section 564(b)(1) of the Act, 21 U.S.C. section 360bbb-3(b)(1), unless the authorization is terminated or revoked sooner.  Performed at [facility], [place] [address], [place], [place] [address]   MRSA PCR Screening     Status: None   Collection Time: 08/08/19  2:56 AM   Specimen: Nasopharyngeal  Result Value Ref Range Status   MRSA by PCR NEGATIVE NEGATIVE Final    Comment:        The GeneXpert MRSA Assay (FDA approved for NASAL specimens only), is one component of a comprehensive MRSA colonization surveillance program. It is not intended to diagnose MRSA infection nor to guide or monitor treatment for MRSA infections. Performed at [facility], [place] [address], [place], [place] [address]          Radiology Studies: MR ANGIO HEAD WO CONTRAST  Result Date: 08/08/2019 CLINICAL DATA:  Rule out metastatic disease.  Intracranial hemorrhage on CT. Atrial fibrillation on Eliquis. EXAM: MRI HEAD WITHOUT AND WITH CONTRAST MRA HEAD WITHOUT CONTRAST TECHNIQUE: Multiplanar, multiecho pulse sequences of the brain and surrounding structures were obtained without and with intravenous contrast. Angiographic images of the head were obtained using MRA technique without contrast. CONTRAST:  50m GADAVIST GADOBUTROL 1 MMOL/ML IV SOLN COMPARISON:  CT head 08/07/2019 FINDINGS: MRI HEAD FINDINGS Brain: Hemorrhage in the right lateral thalamus measuring approximately 10 mm. There is central methemoglobin suggesting this is a subacute hemorrhage, possibly 167week old. Additional areas of chronic microhemorrhage in the occipital lobe bilaterally and in the right cerebellum and brainstem bilaterally. Generalized atrophy. Patchy white matter hyperintensity bilaterally. Chronic infarct right occipital lobe. Negative for acute infarct. No enhancing metastatic deposits identified. Vascular: Normal arterial flow voids Skull and upper cervical spine: No focal skeletal lesion. Sinuses/Orbits: Mild mucosal edema paranasal sinuses. Bilateral cataract extraction Other: None MRA HEAD FINDINGS Both vertebral arteries patent to the basilar without stenosis. The left vertebral artery is tortuous with flattening of the ventral surface of the medulla. Small PICA bilaterally. AICA patent on the left. Basilar widely patent. AICA, superior cerebellar, posterior cerebral arteries patent bilaterally without stenosis. Cavernous carotid patent bilaterally without stenosis. Anterior and middle cerebral arteries patent bilaterally without stenosis or large vessel occlusion. Negative for cerebral aneurysm. IMPRESSION: 1. 1 cm hemorrhage right lateral thalamus appears subacute with methemoglobin present. Additional areas of chronic microhemorrhage in the brain. This may be due to hypertension 2. Negative for metastatic disease. No enhancing lesions in the brain. 3. Moderate  chronic microvascular ischemic change in the white matter. 4. Negative MRA head Electronically Signed   By: CFranchot GalloM.D.   On: 08/08/2019 11:42   MR BRAIN W WO CONTRAST  Result Date: 08/08/2019 CLINICAL DATA:  Rule out metastatic disease. Intracranial hemorrhage on CT. Atrial fibrillation on Eliquis. EXAM: MRI HEAD WITHOUT AND WITH CONTRAST MRA HEAD WITHOUT CONTRAST TECHNIQUE: Multiplanar, multiecho pulse sequences of the brain and surrounding structures were obtained without and with intravenous contrast. Angiographic images of the head were obtained using MRA technique without contrast. CONTRAST:  85m GADAVIST GADOBUTROL 1 MMOL/ML IV SOLN COMPARISON:  CT head 08/07/2019 FINDINGS: MRI HEAD FINDINGS Brain: Hemorrhage in the right lateral thalamus measuring approximately 10 mm. There is central methemoglobin suggesting this is a subacute hemorrhage, possibly 128week old. Additional areas of chronic microhemorrhage in the occipital lobe bilaterally and in the right cerebellum and brainstem bilaterally. Generalized atrophy. Patchy white matter hyperintensity bilaterally. Chronic infarct right occipital lobe. Negative for acute infarct. No enhancing metastatic deposits identified. Vascular: Normal arterial flow voids Skull and upper cervical spine: No focal skeletal lesion. Sinuses/Orbits: Mild mucosal edema paranasal sinuses. Bilateral cataract extraction Other: None MRA HEAD FINDINGS Both vertebral arteries patent to the basilar without stenosis. The left vertebral artery is tortuous with flattening of the ventral surface of the medulla. Small PICA bilaterally. AICA patent on the left. Basilar widely patent. AICA, superior cerebellar, posterior cerebral arteries patent bilaterally without stenosis. Cavernous carotid patent bilaterally without stenosis. Anterior and middle cerebral arteries patent bilaterally without stenosis or large vessel occlusion. Negative for cerebral aneurysm. IMPRESSION: 1. 1 cm  hemorrhage right lateral thalamus appears subacute with methemoglobin present. Additional areas of chronic microhemorrhage in the brain. This may be due to hypertension 2. Negative for metastatic disease. No enhancing lesions in the brain. 3. Moderate chronic microvascular ischemic change in the white matter. 4. Negative MRA head Electronically Signed   By: CFranchot GalloM.D.   On: 08/08/2019 11:42        Scheduled Meds: . aspirin EC  81 mg Oral Daily  . atorvastatin  40 mg Oral QHS  . carvedilol  3.125 mg Oral BID WC  . Chlorhexidine Gluconate Cloth  6 each Topical Daily  . famotidine  20 mg Oral QHS  . feeding supplement (ENSURE ENLIVE)  237 mL Oral TID BM  . losartan  25 mg Oral Daily  . multivitamin with minerals  1 tablet Oral Daily  . sodium chloride flush  3 mL Intravenous Once  . sodium chloride flush  3 mL Intravenous Q12H   Continuous Infusions: . sodium chloride       LOS: 3 days   Time spent:326m  WiLittle IshikawaDO [facility]  If 7PM-7AM, please contact night-coverage [email or web address]  08/10/2019, 7:54 AM

## 2019-08-11 LAB — COMPREHENSIVE METABOLIC PANEL
ALT: 25 U/L (ref 0–44)
AST: 14 U/L — ABNORMAL LOW (ref 15–41)
Albumin: 2.7 g/dL — ABNORMAL LOW (ref 3.5–5.0)
Alkaline Phosphatase: 68 U/L (ref 38–126)
Anion gap: 10 (ref 5–15)
BUN: 31 mg/dL — ABNORMAL HIGH (ref 8–23)
CO2: 28 mmol/L (ref 22–32)
Calcium: 8.9 mg/dL (ref 8.9–10.3)
Chloride: 100 mmol/L (ref 98–111)
Creatinine, Ser: 1.21 mg/dL (ref 0.61–1.24)
GFR calc Af Amer: >60 mL/min (ref >60)
GFR calc non Af Amer: 54 mL/min — ABNORMAL LOW (ref >60)
Glucose, Bld: 87 mg/dL (ref 70–99)
Potassium: 3.6 mmol/L (ref 3.5–5.1)
Sodium: 138 mmol/L (ref 135–145)
Total Bilirubin: 1.2 mg/dL (ref 0.3–1.2)
Total Protein: 5.6 g/dL — ABNORMAL LOW (ref 6.5–8.1)

## 2019-08-11 LAB — CBC
HCT: 37.6 % — ABNORMAL LOW (ref 39.0–52.0)
Hemoglobin: 12.3 g/dL — ABNORMAL LOW (ref 13.0–17.0)
MCH: 29.7 pg (ref 26.0–34.0)
MCHC: 32.7 g/dL (ref 30.0–36.0)
MCV: 90.8 fL (ref 80.0–100.0)
Platelets: 211 10*3/uL (ref 150–400)
RBC: 4.14 MIL/uL — ABNORMAL LOW (ref 4.22–5.81)
RDW: 14.6 % (ref 11.5–15.5)
WBC: 5.7 10*3/uL (ref 4.0–10.5)
nRBC: 0 % (ref 0.0–0.2)

## 2019-08-11 MED ORDER — ADULT MULTIVITAMIN W/MINERALS CH: 1.0000 | ORAL_TABLET | Freq: Every day | ORAL | 0 refills | Status: AC

## 2019-08-11 MED ORDER — METOPROLOL SUCCINATE ER 25 MG PO TB24: 25.0000 mg | ORAL_TABLET | Freq: Two times a day (BID) | ORAL | 0 refills | Status: DC

## 2019-08-11 MED ORDER — ASPIRIN 81 MG PO TBEC: 81.0000 mg | DELAYED_RELEASE_TABLET | Freq: Every day | ORAL | 1 refills | Status: DC

## 2019-08-11 MED ORDER — FAMOTIDINE 20 MG PO TABS: 20.0000 mg | ORAL_TABLET | Freq: Every day | ORAL | 0 refills | Status: AC

## 2019-08-11 MED ORDER — LOSARTAN POTASSIUM 25 MG PO TABS: 25.0000 mg | ORAL_TABLET | Freq: Every day | ORAL | 0 refills | Status: DC

## 2019-08-11 NOTE — TOC Transition Note (Signed)
Transition of Care [facility]) - CM/SW Discharge Note   Patient Details  Name: Brian Brown MRN: [id number] Date of Birth: Jun 10, 1932  [facility] CM/SW Contact:  Pollie Friar, RN Phone Number: 08/11/2019, 10:44 AM   Clinical Narrative:    Pt discharging home with [facility] services through [facility]. Tiffany with [facility] aware of d/c today.  Family requesting walker and adapt to deliver to the room.  Pt has supervision at home and transport to home.   Final next level of care: Home w Home Health Services Barriers to Discharge: No Barriers Identified   Patient Goals and CMS Choice   CMS Medicare.gov Compare Post Acute Care list provided to:: Patient Choice offered to / list presented to : Patient, Adult Children  Discharge Placement                       Discharge Plan and Services   Discharge Planning Services: CM Consult Post Acute Care Choice: Home Health          DME Arranged: Walker rolling DME Agency: [employer] Date DME Agency Contacted: 08/11/19   Representative spoke with at DME Agency: [place]: PT, OT [place] Agency: [facility] (formerly [employer]) Date [place]: 08/10/19   Representative spoke with at [place]: Claude ([facility]) Interventions     Readmission Risk Interventions No flowsheet data found.

## 2019-08-11 NOTE — Progress Notes (Addendum)
Progress Note  Patient Name: Brian Brown Date of Encounter: 08/11/2019  [facility] Cardiologist: Larae Grooms, MD   Subjective   Breathing is OK  No CP  No dizziness   Inpatient Medications    Scheduled Meds: . aspirin EC  81 mg Oral Daily  . atorvastatin  40 mg Oral QHS  . Chlorhexidine Gluconate Cloth  6 each Topical Daily  . famotidine  20 mg Oral QHS  . feeding supplement (ENSURE ENLIVE)  237 mL Oral TID BM  . losartan  25 mg Oral Daily  . metoprolol succinate  25 mg Oral BID WC  . multivitamin with minerals  1 tablet Oral Daily  . sodium chloride flush  3 mL Intravenous Once  . sodium chloride flush  3 mL Intravenous Q12H   Continuous Infusions: . sodium chloride     PRN Meds: sodium chloride, acetaminophen, docusate sodium, ondansetron (ZOFRAN) IV, polyethylene glycol, sodium chloride flush   Vital Signs    Vitals:   08/10/19 2301 08/10/19 2303 08/11/19 0323 08/11/19 0500  BP: (!) 128/91 111/81 (!) 124/91   Pulse: 83 85 82   Resp: 18  16   Temp:   98.2 F (36.8 C)   TempSrc:   Oral   SpO2: 99% 100% 95%   Weight:    74.6 kg  Height:        Intake/Output Summary (Last 24 hours) at 08/11/2019 0932 Last data filed at 08/11/2019 0323 Gross per 24 hour  Intake 286 ml  Output --  Net 286 ml   Net neg 2.8  L   Last 3 Weights 08/11/2019 08/10/2019 08/09/2019  Weight (lbs) 164 lb 7.4 oz 154 lb 3.2 oz 153 lb 7 oz  Weight (kg) 74.6 kg 69.945 kg 69.6 kg      Telemetry    Afib  80s    - Personally Reviewed  ECG    No new  - Personally Reviewed  Physical Exam   GEN: No acute distress.   Neck:  JVP is not elevated   Cardiac: Irreg irreg  , no murmurs Respiratory: Clear to auscultation bilaterally.anteirorly  GI: Soft, nontender, non-distended  MS: No LE edema; No  Labs    High Sensitivity Troponin:   Recent Labs  Lab 08/06/19 1654 08/06/19 1854  TROPONINIHS 35* 35*      Chemistry Recent Labs  Lab 08/06/19 1642 08/07/19 0449  08/09/19 0417 08/10/19 0214 08/11/19 0334  NA 142   < > 140 138 138  K 3.4*   < > 3.1* 3.5 3.6  CL 106   < > 99 99 100  CO2 25   < > 31 31 28   GLUCOSE 123*   < > 108* 100* 87  BUN 25*   < > 21 29* 31*  CREATININE 1.43*   < > 1.42* 1.40* 1.21  CALCIUM 8.7*   < > 9.1 8.9 8.9  PROT 6.8  --   --  5.5* 5.6*  ALBUMIN 3.4*  --   --  2.5* 2.7*  AST 53*  --   --  15 14*  ALT 65*  --   --  29 25  ALKPHOS 89  --   --  68 68  BILITOT 1.5*  --   --  0.8 1.2  GFRNONAA 44*   < > 45* 45* 54*  GFRAA 51*   < > 52* 53* >60  ANIONGAP 11   < > 10 8 10    < > =  values in this interval not displayed.     Hematology Recent Labs  Lab 08/09/19 0417 08/10/19 0214 08/11/19 0334  WBC 7.4 7.6 5.7  RBC 4.54 4.11* 4.14*  HGB 13.7 12.4* 12.3*  HCT 41.1 37.3* 37.6*  MCV 90.5 90.8 90.8  MCH 30.2 30.2 29.7  MCHC 33.3 33.2 32.7  RDW 14.6 14.4 14.6  PLT 190 179 211    BNP Recent Labs  Lab 08/06/19 1642  BNP 1,349.7*     DDimer No results for input(s): DDIMER in the last 168 hours.   Radiology    No results found.  Cardiac Studies     Cath: 10/19/18    The left ventricular systolic function is normal.  LV end diastolic pressure is normal.  The left ventricular ejection fraction is 55-65% by visual estimate.  Prox RCA lesion is 30% stenosed.  Dist RCA lesion is 20% stenosed.  Ost Cx to Prox Cx lesion is 50% stenosed.  Prox LAD to Mid LAD lesion is 60% stenosed.  1st Diag lesion is 90% stenosed.  Dist LAD lesion is 30% stenosed.  2nd Mrg lesion is 90% stenosed.   1.  Severe one-vessel coronary artery disease with subtotal thrombotic occlusion of the distal OM2 at the trifurcation of 3 terminal branches.  The vessel in this area is 1.5 mm in diameter and at a trifurcation and thus not suitable for PCI.  The vessel is also very tortuous.  There is also moderate ostial left circumflex and proximal LAD disease. 2.  Normal LV systolic function and left ventricular end-diastolic  pressure.   Recommendations: Recommend aggressive medical therapy.  Eliquis can be resumed tonight.  I favor that he continues to take an antiplatelet medication given this thrombotic event.  He seems to be already on Plavix which should be continued."   Echocardiogram 08/07/19:   1. Left ventricular ejection fraction by 3D volume is 21 %. The left  ventricle has severely decreased function. The left ventricle demonstrates  regional wall motion abnormalities (see scoring diagram/findings for  description). There is mild left  ventricular hypertrophy. Left ventricular diastolic parameters are  indeterminate.   2. Right ventricular systolic function is moderately reduced. The right  ventricular size is normal. There is mildly elevated pulmonary artery  systolic pressure. The estimated right ventricular systolic pressure is  02.6 mmHg.   3. Left atrial size was moderately dilated.   4. Right atrial size was moderately dilated.   5. The mitral valve is degenerative. Mild mitral valve regurgitation. No  evidence of mitral stenosis.   6. The aortic valve is abnormal. Aortic valve regurgitation is trivial.  No aortic stenosis is present.   7. The inferior vena cava is dilated in size with <50% respiratory  variability, suggesting right atrial pressure of 15 mmHg.   Comparison(s): A prior study was performed on 10/20/18. Prior images  reviewed side by side. LVEF has decreased.     Patient Profile     Brian Brown is a 86 y.o. male with a hx of CVA 02/2018 with PAF confirmed on loop recorder on Eliquis, obstructive sleep apnea (not on CPAP), CAD with NSTEMI treated medically as outlined below, mild cardiomyopathy (EF 45-50%), hypertension, hyperlipidemia, CKD II, mild dilation of ascending aorta, and prostate CA who is being seen today for the evaluation of CHF at the request of Dr. Valeta Harms.  Assessment & Plan    1  Acute on chronic systolic CHF    Volume status is OK  LV function is  significant down from previous (echo above)   He is now on Toprol and Losartan   BP is OK WIll need to follow as outpt    Low Na diet   2  Afib   Rate remain controlled  Not a candidate for anticoag now with ICH  Neuro to guide in timing   3  CAD   Last cath in Oct 2020   Severe 1 V CAD with subtotal thrombotic occluse of distal OM   Pt asymptomatic   Continue ASA for now    4  HTN BP OK today    Will make sure he has f/u in clinic if goes home   Will sign off  For questions or updates, please contact [facility] Please consult [email or web address] for contact info under        Signed, Dorris Carnes, MD  08/11/2019, 9:32 AM

## 2019-08-11 NOTE — Plan of Care (Signed)
Patient stable, discussed POC with patient and daughter, agreeable with plan. Discussed stroke education via stroke handbook via teach back, verbalizes understanding, denies question/concerns at this time.    Problem: Education: Goal: Knowledge of disease or condition will improve Outcome: Adequate for Discharge Goal: Knowledge of secondary prevention will improve Outcome: Adequate for Discharge Goal: Knowledge of patient specific risk factors addressed and post discharge goals established will improve Outcome: Adequate for Discharge Goal: Individualized Educational Video(s) Outcome: Adequate for Discharge   Problem: Coping: Goal: Will verbalize positive feelings about self Outcome: Adequate for Discharge Goal: Will identify appropriate support needs Outcome: Adequate for Discharge   Problem: Health Behavior/Discharge Planning: Goal: Ability to manage health-related needs will improve Outcome: Adequate for Discharge   Problem: Self-Care: Goal: Ability to participate in self-care as condition permits will improve Outcome: Adequate for Discharge Goal: Verbalization of feelings and concerns over difficulty with self-care will improve Outcome: Adequate for Discharge Goal: Ability to communicate needs accurately will improve Outcome: Adequate for Discharge   Problem: Nutrition: Goal: Risk of aspiration will decrease Outcome: Adequate for Discharge Goal: Dietary intake will improve Outcome: Adequate for Discharge   Problem: Intracerebral Hemorrhage Tissue Perfusion: Goal: Complications of Intracerebral Hemorrhage will be minimized Outcome: Adequate for Discharge

## 2019-08-11 NOTE — Progress Notes (Signed)
Occupational Therapy Treatment Patient Details Name: Brian Brown MRN: [id number] DOB: 1932-08-20 Today's Date: 08/11/2019    History of present illness 86 y.o. male with medical history significant for CVA, CAD s/p NSTEMI, cardiomyopathy, paroxysmal atrial fibrillation on Eliquis, OSA not on CPAP, hypertension, hepatitis C who presents with concerns of worsening fatigue. 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   OT comments  Upon arrival pt sitting EOB eating breakfast and agreeable to skilled-OT services. Pt able to complete mobility with RW to toilet with min guard. Pt completing toilet transfer with Min A needing extra assist to descent due to low toilet and verbal cues for hand placement/safet with RW. Pt reported having 3 in 1 and patient was educated on using it over the toilet and in the shower for safety upon dc. Pt washed hands at sink with min guard for balance and left in recliner with call bell/phone within reach and bed alarm set. Believe dc plans remain appropriate and pt confirms 24/7 support upon dc. Will continue to follow acutely.   Follow Up Recommendations  Home health OT;Supervision/Assistance - 24 hour    Equipment Recommendations  None recommended by OT       Precautions / Restrictions Precautions Precautions: Fall Restrictions Weight Bearing Restrictions: No       Mobility Bed Mobility                Pt sitting EOB upon arrival  Transfers Overall transfer level: Needs assistance Equipment used: Rolling walker (2 wheeled) Transfers: Sit to/from Stand Sit to Stand: Min assist         General transfer comment: Min A VC for safety with descending when sitting on toilet.     Balance Overall balance assessment: Needs assistance Sitting-balance support: No upper extremity supported;Feet supported Sitting balance-Leahy Scale: Good     Standing balance support: No upper extremity supported;During  functional activity Standing balance-Leahy Scale: Fair Standing balance comment: RW for ambulation, able to stand at sink with reaching outside his BOS without UE support                           ADL either performed or assessed with clinical judgement   ADL Overall ADL's : Needs assistance/impaired     Grooming: Wash/dry hands;Min guard;Standing Grooming Details (indicate cue type and reason): wash hands with min guard for safety         Upper Body Dressing : Sitting;Min guard Upper Body Dressing Details (indicate cue type and reason): pt able to doff SCD's with min guard     Toilet Transfer: Minimal assistance;Ambulation;Comfort height toilet;Grab bars;RW [employer] forces technical [employer] Details (indicate cue type and reason): Min A for descent to low toilet Toileting- Clothing Manipulation and Hygiene: Supervision/safety;Sitting/lateral lean (using grab bars) Toileting - Clothing Manipulation Details (indicate cue type and reason): supervision for safety     Functional mobility during ADLs: Min guard;Rolling walker;Cueing for safety General ADL Comments: requires assist for balance                Cognition Arousal/Alertness: Awake/alert Behavior During Therapy: WFL for tasks assessed/performed Overall Cognitive Status: Within Functional Limits for tasks assessed  General Comments VSS discussed family assist and safety with OOB activities while in the home. Pt reported he has 3 in 1 at home that he will use in the shower and over the toilet upon dc for extra safety.     Pertinent Vitals/ Pain       Pain Assessment: No/denies pain     Prior Functioning/Environment              Frequency  Min 2X/week        Progress Toward Goals  OT Goals(current goals can now be found in the care plan section)  Progress towards OT goals: Progressing toward goals  Acute Rehab OT Goals Patient Stated Goal: go  home OT Goal Formulation: With patient  Plan Discharge plan remains appropriate       AM-PAC OT "6 Clicks" Daily Activity     Outcome Measure   Help from another person eating meals?: None Help from another person taking care of personal grooming?: A Little Help from another person toileting, which includes using toliet, bedpan, or urinal?: A Little Help from another person bathing (including washing, rinsing, drying)?: A Little Help from another person to put on and taking off regular upper body clothing?: A Little Help from another person to put on and taking off regular lower body clothing?: A Little 6 Click Score: 19    End of Session Equipment Utilized During Treatment: Rolling walker;Gait belt  OT Visit Diagnosis: Unsteadiness on feet (R26.81);Low vision, both eyes (H54.2)   Activity Tolerance Patient tolerated treatment well   Patient Left in chair;with call bell/phone within reach;with chair alarm set   Nurse Communication Mobility status        Time: [phone] OT Time Calculation (min): 22 min  Charges: OT General Charges $OT Visit: 1 Visit OT Treatments $Self Care/Home Management : 8-22 mins  Jasaun Carn/OTS  Elester Apodaca 08/11/2019, 9:30 AM

## 2019-08-11 NOTE — Discharge Summary (Signed)
Physician Discharge Summary  MYLON MABEY IRJ:[id number] DOB: 1932-04-17 DOA: 08/06/2019  PCP: Seward Carol, MD  Admit date: 08/06/2019 Discharge date: 08/11/2019  Admitted From: Home Disposition: Home with home health  Recommendations for Outpatient Follow-up:  1. Follow up with PCP in 1-2 weeks; neurology in 6 weeks 2. Repeat noncontrast head CT no sooner than 2 weeks, if cannot be done prior to neurology outpatient visit neurology will repeat CT at that time. 3. Continue only aspirin for now, medication changes pending imaging and neurology follow-up  Home Health: Physical therapy  Discharge Condition: Stable CODE STATUS: Full Diet recommendation: As tolerated  Brief/Interim Summary: Nelia Shi a 86 y.o.malewith medical history significant forCVA w/ R sided weakness,CAD s/pNSTEMI,cardiomyopathy,paroxysmal atrial fibrillation on Eliquis, OSA not on CPAP, hypertension,hepatitis C who presents with concerns of worsening fatigue. For the past month he has felt progressive fatigue. He usually walks his dog about twice a day but for the past 2 weeks he could no longer do that. He felt short of breath just doing dishes or making his bed. He also had a fall last week taking trash out and felt like his "hips slipped out from under him."Denies any lightheadedness or loss of consciousness. He hasalso been having on and off lower extremity edema with the right worse than the left ever since he had a stroke last year. However in the past 2 to 3 days he noticed that both legs have been significantly worse. He takes Lasix daily.He also is on Eliquis forparoxysmal atrial fibrillation denies any missed doses. Otherwise he denies chest pain, orthopnea, nausea vomiting or diarrhea.Pt lives alone but has family that checks on him on a regarding basis.He was afebrile and hypertensive with systolics of 301S over 010X. Troponin mildly elevated at 35 although generally run in the 50s in  the past. BNP of 1249. K of 3.4. Creatinine of 1.43 elevated from 1.15 from several months ago. Mildly elevated AST of 53, ALT of 65, total bilirubin of 1.5. Chest x-ray shows mild pulmonary vascular congestion and trace right pleural effusion. CTA shows equivocal tiny filling defect in posterior right lower lobe. Small bilateral pleural effusion. Nondisplaced lateral right fourth rib fracture  Patient admitted as above with worsening fatigue volume status in the setting of acute heart failure exacerbation, unfortunately during hospital stay patient had notable mental status changes and deficits, stroke protocol was enacted ultimately patient noted to have right lateral thalamic intracranial hemorrhage.  He was previously on aspirin Plavix and Eliquis given his history of A. fib and CAD, neurology recommending aspirin only for now, repeat noncontrast head CT as above in 2 to 6 weeks pending availability with ultimate discussion for reinitiation of anticoagulation at that time pending imaging.  Patient has continued to improve drastically over the past few days, now able to ambulate with minimal assistance, PT recommending home health, patient's daughters updated over the phone and agree that patient would be able to be supervised at home and are otherwise agreeable for discharge.  Of note patient also had repeat echocardiogram showing a further worsening of ejection fraction down to 21% with global hypokinesis and last echo in 2020 showed 45 to 50% EF.  Cardiology continues to manage patient's medications, currently as below patient's blood pressure is stable and appears euvolemic.  Patient will need close follow-up with PCP, cardiology and neurology in the next few weeks as scheduled.   Discharge Diagnoses:  Active Problems:   Hypertension   PAF (paroxysmal atrial fibrillation) ([place])  Acute CHF (congestive heart failure) ([facility])   AKI (acute kidney injury) ([facility])   Elevated troponin    Transaminitis   Closed rib fracture   History of CVA (cerebrovascular accident)   Intracerebral hemorrhage ([facility])   Protein-calorie malnutrition, severe    Discharge Instructions  Discharge Instructions    (HEART FAILURE PATIENTS) Call MD:  Anytime you have any of the following symptoms: 1) 3 pound weight gain in 24 hours or 5 pounds in 1 week 2) shortness of breath, with or without a dry hacking cough 3) swelling in the hands, feet or stomach 4) if you have to sleep on extra pillows at night in order to breathe.   Complete by: As directed    Call MD for:  difficulty breathing, headache or visual disturbances   Complete by: As directed    Call MD for:  extreme fatigue   Complete by: As directed    Call MD for:  persistant dizziness or light-headedness   Complete by: As directed    Call MD for:  persistant nausea and vomiting   Complete by: As directed    Call MD for:  temperature >100.4   Complete by: As directed    Diet - low sodium heart healthy   Complete by: As directed    Increase activity slowly   Complete by: As directed      Allergies as of 08/11/2019   No Known Allergies     Medication List    STOP taking these medications   apixaban 5 MG Tabs tablet Commonly known as: ELIQUIS   clopidogrel 75 MG tablet Commonly known as: PLAVIX   furosemide 20 MG tablet Commonly known as: LASIX   isosorbide mononitrate 30 MG 24 hr tablet Commonly known as: IMDUR   lisinopril 10 MG tablet Commonly known as: ZESTRIL   metoprolol tartrate 25 MG tablet Commonly known as: LOPRESSOR     TAKE these medications   aspirin 81 MG EC tablet Take 1 tablet (81 mg total) by mouth daily. Swallow whole.   atorvastatin 40 MG tablet Commonly known as: LIPITOR TAKE 1 TABLET EVERY DAY What changed: when to take this   famotidine 20 MG tablet Commonly known as: PEPCID Take 1 tablet (20 mg total) by mouth at bedtime.   ibuprofen 200 MG tablet Commonly known as: ADVIL Take 200 mg  by mouth daily as needed for headache.   latanoprost 0.005 % ophthalmic solution Commonly known as: XALATAN Place 1 drop into both eyes at bedtime.   losartan 25 MG tablet Commonly known as: COZAAR Take 1 tablet (25 mg total) by mouth daily.   metoprolol succinate 25 MG 24 hr tablet Commonly known as: TOPROL-XL Take 1 tablet (25 mg total) by mouth 2 (two) times daily with a meal.   multivitamin with minerals Tabs tablet Take 1 tablet by mouth daily.   nitroGLYCERIN 0.4 MG SL tablet Commonly known as: NITROSTAT Place 1 tablet (0.4 mg total) under the tongue every 5 (five) minutes x 3 doses as needed for chest pain.   OVER THE COUNTER MEDICATION Take 2-3 capsules by mouth See admin instructions. Omega XL - take 3 capsules by mouth every morning and 2 capsules at night            Durable Medical Equipment  (From admission, onward)         Start     Ordered   08/11/19 1011  For home use only DME Walker rolling  Once  Question Answer Comment  Walker: With 5 Inch Wheels   Patient needs a walker to treat with the following condition Brainstem hemorrhage ([place])      08/11/19 1011          Follow-up Information    Home, [facility] Follow up.   Specialty: Home Health Services Why: THe home health agency will contact you for the first home visit. Contact information: [address] [place] [address] [phone]        Jettie Booze, MD Follow up on 09/08/2019.   Specialties: Cardiology, Radiology, Interventional Cardiology Why: 10:20AM. Cardiology follow up Contact information: [address] [place] [address] [phone]              No Known Allergies  Consultations: Cardiology, neurology, PCCM  Procedures/Studies: CT HEAD WO CONTRAST  Result Date: 08/07/2019 CLINICAL DATA:  Stroke.  Follow-up intracranial hemorrhage. EXAM: CT HEAD WITHOUT CONTRAST TECHNIQUE: Contiguous axial images were obtained from the  base of the skull through the vertex without intravenous contrast. COMPARISON:  CT head without contrast 08/07/2019 at 6:10 p.m. FINDINGS: Brain: Right thalamic hemorrhage is stable in size at 10 mm. Surrounding edema is stable. No new hemorrhage is present. Moderate atrophy and white matter changes are stable. Cortical infarct of the right parietal lobe is stable. No new infarcts are present. Basal ganglia are otherwise within normal limits. Remote infarct of the anterior right insular cortex is again noted. Remote infarct of the medial right occipital lobe is again noted. Vascular: No hyperdense vessel or unexpected calcification. Skull: Calvarium is intact. No focal lytic or blastic lesions are present. No significant extracranial soft tissue lesion is present. Sinuses/Orbits: The paranasal sinuses and mastoid air cells are clear. Bilateral lens replacements are noted. Globes and orbits are otherwise unremarkable. IMPRESSION: 1. Stable 10 mm right thalamic hemorrhage with surrounding edema. 2. Stable atrophy and white matter disease. 3. Stable remote infarcts of the right parietal lobe, anterior right insular cortex, and medial right occipital lobe. Electronically Signed   By: San Morelle M.D.   On: 08/07/2019 22:49   CT HEAD WO CONTRAST  Result Date: 08/07/2019 CLINICAL DATA:  Mental status change, confusion EXAM: CT HEAD WITHOUT CONTRAST TECHNIQUE: Contiguous axial images were obtained from the base of the skull through the vertex without intravenous contrast. COMPARISON:  CT head 02/13/2018 FINDINGS: Brain: 10 mm hyperdensity right thalamus is new since the prior study most consistent with acute hemorrhage. No significant mass-effect or surrounding edema. Ventricle size normal. Patchy white matter hypodensity bilaterally is chronic and unchanged. Chronic infarct right occipital lobe was not present previously. Negative for acute infarct. Vascular: Negative for hyperdense vessel Skull: Negative  Sinuses/Orbits: Negative Other: None IMPRESSION: 10 mm hyperdensity right thalamus compatible with acute hemorrhage. Atrophy and chronic ischemic change. Chronic infarct right occipital lobe. These results were called by telephone at the time of interpretation on 08/07/2019 at 6:45 pm to provider [facility] , who verbally acknowledged these results. Electronically Signed   By: Franchot Gallo M.D.   On: 08/07/2019 18:43   CT Angio Chest PE W/Cm &/Or Wo Cm  Result Date: 08/06/2019 CLINICAL DATA:  Fatigue for 2- 3 weeks recent fall EXAM: CT ANGIOGRAPHY CHEST WITH CONTRAST TECHNIQUE: Multidetector CT imaging of the chest was performed using the standard protocol during bolus administration of intravenous contrast. Multiplanar CT image reconstructions and MIPs were obtained to evaluate the vascular anatomy. CONTRAST:  135mL OMNIPAQUE IOHEXOL 350 MG/ML SOLN COMPARISON:  None. FINDINGS: Cardiovascular: There is a optimal opacification of the pulmonary arteries. There is equivocal partial non opacification of a small subsegmental posterior right lower lobe pulmonary artery best seen on series 3, image 175. No segmental or central pulmonary embolism is seen. No left-sided filling defects are noted. There is mild cardiomegaly. Coronary artery calcifications are seen. No pericardial effusion or thickening. No evidence right heart strain. There is normal three-vessel brachiocephalic anatomy without proximal stenosis. Scattered aortic atherosclerosis is noted. Mediastinum/Nodes: No hilar, mediastinal, or axillary adenopathy. Thyroid gland, trachea, and esophagus demonstrate no significant findings. Lungs/Pleura: Small bilateral pleural effusions are present, right greater than left with hazy adjacent atelectasis. Musculoskeletal: There is a nondisplaced lateral right fourth rib fracture present. Review of the MIP images confirms the above findings. Abdomen/pelvis: Hepatobiliary: Multiple low-density lesions are seen  throughout the liver parenchyma the largest within the right liver lobe measuring 3.6 cm. Main portal vein is patent. Partially calcified gallstones are present. No intrahepatic biliary ductal dilatation. Evidence of calcified gallstones, gallbladder wall thickening or biliary dilatation. Pancreas: Unremarkable. No pancreatic ductal dilatation or surrounding inflammatory changes. Spleen: Normal in size without focal abnormality. Adrenals/Urinary Tract: Both adrenal glands appear normal. Bilateral low-density lesions are seen within both kidneys the largest partially exophytic off the upper pole of the left kidney measuring 7.8 cm. No renal or collecting system calculi. No hydronephrosis. The bladder is grossly unremarkable. Stomach/Bowel: The stomach, small bowel, and colon are normal in appearance. No inflammatory changes, wall thickening, or obstructive findings.Scattered colonic diverticula are noted. Vascular/Lymphatic: There are no enlarged mesenteric, retroperitoneal, or pelvic lymph nodes. Scattered aortic atherosclerotic calcifications are seen without aneurysmal dilatation. Reproductive: Suboptimally visualized due to overlying streak artifact. Other: No evidence of abdominal wall mass or hernia. Musculoskeletal: No acute or significant osseous findings. Bilateral total hip arthroplasties are present. Degenerative changes are seen in the thoracolumbar spine. IMPRESSION: 1. Equivocal tiny filling defect in the posterior right lower lobe subsegmental pulmonary artery which could represent a small pulmonary embolism versus non opacification. No large or central pulmonary embolism. 2. Small bilateral pleural effusions and dependent atelectasis 3. No acute intra-abdominal or pelvic pathology to explain the patient's symptoms 4. Nondisplaced lateral right fourth rib fracture 5.  Aortic Atherosclerosis (ICD10-I70.0). Electronically Signed   By: Prudencio Pair M.D.   On: 08/06/2019 21:21   MR ANGIO HEAD WO  CONTRAST  Result Date: 08/08/2019 CLINICAL DATA:  Rule out metastatic disease. Intracranial hemorrhage on CT. Atrial fibrillation on Eliquis. EXAM: MRI HEAD WITHOUT AND WITH CONTRAST MRA HEAD WITHOUT CONTRAST TECHNIQUE: Multiplanar, multiecho pulse sequences of the brain and surrounding structures were obtained without and with intravenous contrast. Angiographic images of the head were obtained using MRA technique without contrast. CONTRAST:  70mL GADAVIST GADOBUTROL 1 MMOL/ML IV SOLN COMPARISON:  CT head 08/07/2019 FINDINGS: MRI HEAD FINDINGS Brain: Hemorrhage in the right lateral thalamus measuring approximately 10 mm. There is central methemoglobin suggesting this is a subacute hemorrhage, possibly 72 week old. Additional areas of chronic microhemorrhage in the occipital lobe bilaterally and in the right cerebellum and brainstem bilaterally. Generalized atrophy. Patchy white matter hyperintensity bilaterally. Chronic infarct right occipital lobe. Negative for acute infarct. No enhancing metastatic deposits identified. Vascular: Normal arterial flow voids Skull and upper cervical spine: No focal skeletal lesion. Sinuses/Orbits: Mild mucosal edema paranasal sinuses. Bilateral cataract extraction Other: None MRA HEAD FINDINGS Both vertebral arteries patent to the basilar without stenosis. The left vertebral artery is tortuous with flattening of the ventral surface of the medulla.  Small PICA bilaterally. AICA patent on the left. Basilar widely patent. AICA, superior cerebellar, posterior cerebral arteries patent bilaterally without stenosis. Cavernous carotid patent bilaterally without stenosis. Anterior and middle cerebral arteries patent bilaterally without stenosis or large vessel occlusion. Negative for cerebral aneurysm. IMPRESSION: 1. 1 cm hemorrhage right lateral thalamus appears subacute with methemoglobin present. Additional areas of chronic microhemorrhage in the brain. This may be due to hypertension 2.  Negative for metastatic disease. No enhancing lesions in the brain. 3. Moderate chronic microvascular ischemic change in the white matter. 4. Negative MRA head Electronically Signed   By: Franchot Gallo M.D.   On: 08/08/2019 11:42   MR BRAIN W WO CONTRAST  Result Date: 08/08/2019 CLINICAL DATA:  Rule out metastatic disease. Intracranial hemorrhage on CT. Atrial fibrillation on Eliquis. EXAM: MRI HEAD WITHOUT AND WITH CONTRAST MRA HEAD WITHOUT CONTRAST TECHNIQUE: Multiplanar, multiecho pulse sequences of the brain and surrounding structures were obtained without and with intravenous contrast. Angiographic images of the head were obtained using MRA technique without contrast. CONTRAST:  34mL GADAVIST GADOBUTROL 1 MMOL/ML IV SOLN COMPARISON:  CT head 08/07/2019 FINDINGS: MRI HEAD FINDINGS Brain: Hemorrhage in the right lateral thalamus measuring approximately 10 mm. There is central methemoglobin suggesting this is a subacute hemorrhage, possibly 73 week old. Additional areas of chronic microhemorrhage in the occipital lobe bilaterally and in the right cerebellum and brainstem bilaterally. Generalized atrophy. Patchy white matter hyperintensity bilaterally. Chronic infarct right occipital lobe. Negative for acute infarct. No enhancing metastatic deposits identified. Vascular: Normal arterial flow voids Skull and upper cervical spine: No focal skeletal lesion. Sinuses/Orbits: Mild mucosal edema paranasal sinuses. Bilateral cataract extraction Other: None MRA HEAD FINDINGS Both vertebral arteries patent to the basilar without stenosis. The left vertebral artery is tortuous with flattening of the ventral surface of the medulla. Small PICA bilaterally. AICA patent on the left. Basilar widely patent. AICA, superior cerebellar, posterior cerebral arteries patent bilaterally without stenosis. Cavernous carotid patent bilaterally without stenosis. Anterior and middle cerebral arteries patent bilaterally without stenosis or  large vessel occlusion. Negative for cerebral aneurysm. IMPRESSION: 1. 1 cm hemorrhage right lateral thalamus appears subacute with methemoglobin present. Additional areas of chronic microhemorrhage in the brain. This may be due to hypertension 2. Negative for metastatic disease. No enhancing lesions in the brain. 3. Moderate chronic microvascular ischemic change in the white matter. 4. Negative MRA head Electronically Signed   By: Franchot Gallo M.D.   On: 08/08/2019 11:42   CT Abdomen Pelvis W Contrast  Result Date: 08/06/2019 CLINICAL DATA:  Fatigue for 2- 3 weeks recent fall EXAM: CT ANGIOGRAPHY CHEST WITH CONTRAST TECHNIQUE: Multidetector CT imaging of the chest was performed using the standard protocol during bolus administration of intravenous contrast. Multiplanar CT image reconstructions and MIPs were obtained to evaluate the vascular anatomy. CONTRAST:  148mL OMNIPAQUE IOHEXOL 350 MG/ML SOLN COMPARISON:  None. FINDINGS: Cardiovascular: There is a optimal opacification of the pulmonary arteries. There is equivocal partial non opacification of a small subsegmental posterior right lower lobe pulmonary artery best seen on series 3, image 175. No segmental or central pulmonary embolism is seen. No left-sided filling defects are noted. There is mild cardiomegaly. Coronary artery calcifications are seen. No pericardial effusion or thickening. No evidence right heart strain. There is normal three-vessel brachiocephalic anatomy without proximal stenosis. Scattered aortic atherosclerosis is noted. Mediastinum/Nodes: No hilar, mediastinal, or axillary adenopathy. Thyroid gland, trachea, and esophagus demonstrate no significant findings. Lungs/Pleura: Small bilateral pleural effusions are present, right greater than  left with hazy adjacent atelectasis. Musculoskeletal: There is a nondisplaced lateral right fourth rib fracture present. Review of the MIP images confirms the above findings. Abdomen/pelvis:  Hepatobiliary: Multiple low-density lesions are seen throughout the liver parenchyma the largest within the right liver lobe measuring 3.6 cm. Main portal vein is patent. Partially calcified gallstones are present. No intrahepatic biliary ductal dilatation. Evidence of calcified gallstones, gallbladder wall thickening or biliary dilatation. Pancreas: Unremarkable. No pancreatic ductal dilatation or surrounding inflammatory changes. Spleen: Normal in size without focal abnormality. Adrenals/Urinary Tract: Both adrenal glands appear normal. Bilateral low-density lesions are seen within both kidneys the largest partially exophytic off the upper pole of the left kidney measuring 7.8 cm. No renal or collecting system calculi. No hydronephrosis. The bladder is grossly unremarkable. Stomach/Bowel: The stomach, small bowel, and colon are normal in appearance. No inflammatory changes, wall thickening, or obstructive findings.Scattered colonic diverticula are noted. Vascular/Lymphatic: There are no enlarged mesenteric, retroperitoneal, or pelvic lymph nodes. Scattered aortic atherosclerotic calcifications are seen without aneurysmal dilatation. Reproductive: Suboptimally visualized due to overlying streak artifact. Other: No evidence of abdominal wall mass or hernia. Musculoskeletal: No acute or significant osseous findings. Bilateral total hip arthroplasties are present. Degenerative changes are seen in the thoracolumbar spine. IMPRESSION: 1. Equivocal tiny filling defect in the posterior right lower lobe subsegmental pulmonary artery which could represent a small pulmonary embolism versus non opacification. No large or central pulmonary embolism. 2. Small bilateral pleural effusions and dependent atelectasis 3. No acute intra-abdominal or pelvic pathology to explain the patient's symptoms 4. Nondisplaced lateral right fourth rib fracture 5.  Aortic Atherosclerosis (ICD10-I70.0). Electronically Signed   By: Prudencio Pair M.D.    On: 08/06/2019 21:21   DG Chest Port 1 View  Result Date: 08/06/2019 CLINICAL DATA:  Fatigue EXAM: PORTABLE CHEST 1 VIEW COMPARISON:  April 13, 2019 FINDINGS: There is unchanged cardiomegaly. Aortic knob calcifications are seen. Trace right pleural effusion is seen. There is mild prominence of the central pulmonary vasculature. Overlying loop recorder is seen. No acute osseous abnormality. IMPRESSION: Mild pulmonary vascular congestion and trace right pleural effusion. Electronically Signed   By: Prudencio Pair M.D.   On: 08/06/2019 20:00   ECHOCARDIOGRAM COMPLETE  Result Date: 08/07/2019    ECHOCARDIOGRAM REPORT   Patient Name:   ABDULRAHIM SIDDIQI Date of Exam: 08/07/2019 Medical Rec #:  [id number]        Height:       68.0 in Accession #:    [id number]       Weight:       165.0 lb Date of Birth:  1932-12-26        BSA:          1.883 m Patient Age:    41 years         BP:           142/101 mmHg Patient Gender: M                HR:           85 bpm. Exam Location:  Inpatient Procedure: 2D Echo, 3D Echo, Color Doppler and Cardiac Doppler Indications:    O27.03 Acute diastolic (congestive) heart failure  History:        Patient has prior history of Echocardiogram examinations, most                 recent 10/20/2018. CHF, CAD, Pacemaker, Arrythmias:Atrial  Fibrillation; Risk Factors:Hypertension and Sleep Apnea. COVID+                 on 11/07/18.  Sonographer:    Raquel Sarna Senior RDCS Referring Phys: [id number] [employer]  Sonographer Comments: Windows limited by small rib spacing. IMPRESSIONS  1. Left ventricular ejection fraction by 3D volume is 21 %. The left ventricle has severely decreased function. The left ventricle demonstrates regional wall motion abnormalities (see scoring diagram/findings for description). There is mild left ventricular hypertrophy. Left ventricular diastolic parameters are indeterminate.  2. Right ventricular systolic function is moderately reduced. The right ventricular size is  normal. There is mildly elevated pulmonary artery systolic pressure. The estimated right ventricular systolic pressure is 92.4 mmHg.  3. Left atrial size was moderately dilated.  4. Right atrial size was moderately dilated.  5. The mitral valve is degenerative. Mild mitral valve regurgitation. No evidence of mitral stenosis.  6. The aortic valve is abnormal. Aortic valve regurgitation is trivial. No aortic stenosis is present.  7. The inferior vena cava is dilated in size with <50% respiratory variability, suggesting right atrial pressure of 15 mmHg. Comparison(s): A prior study was performed on 10/20/18. Prior images reviewed side by side. LVEF has decreased. FINDINGS  Left Ventricle: Left ventricular ejection fraction by 3D volume is 21 %. The left ventricle has severely decreased function. The left ventricle demonstrates regional wall motion abnormalities. The left ventricular internal cavity size was normal in size. There is mild left ventricular hypertrophy. Left ventricular diastolic parameters are indeterminate.  LV Wall Scoring: The mid and distal lateral wall, mid anterolateral segment, mid inferoseptal segment, apical septal segment, mid anterior segment, and apical inferior segment are akinetic. The mid anteroseptal segment, basal anterolateral segment, apical anterior segment, mid inferior segment, and basal inferoseptal segment are hypokinetic. The basal anteroseptal segment is normal. Right Ventricle: The right ventricular size is normal. No increase in right ventricular wall thickness. Right ventricular systolic function is moderately reduced. There is mildly elevated pulmonary artery systolic pressure. The tricuspid regurgitant velocity is 2.57 m/s, and with an assumed right atrial pressure of 15 mmHg, the estimated right ventricular systolic pressure is 26.8 mmHg. Left Atrium: Left atrial size was moderately dilated. Right Atrium: Right atrial size was moderately dilated. Pericardium: There is no  evidence of pericardial effusion. Mitral Valve: The mitral valve is degenerative in appearance. Mild mitral valve regurgitation. No evidence of mitral valve stenosis. Tricuspid Valve: The tricuspid valve is normal in structure. Tricuspid valve regurgitation is trivial. Aortic Valve: The aortic valve is abnormal. Aortic valve regurgitation is trivial. No aortic stenosis is present. There is severe calcifcation of the aortic valve. Pulmonic Valve: The pulmonic valve was normal in structure. Pulmonic valve regurgitation is mild. Aorta: The aortic root is normal in size and structure. Venous: The inferior vena cava is dilated in size with less than 50% respiratory variability, suggesting right atrial pressure of 15 mmHg. IAS/Shunts: No atrial level shunt detected by color flow Doppler.  LEFT VENTRICLE PLAX 2D LVIDd:         4.30 cm LVIDs:         4.10 cm LV PW:         1.20 cm         3D Volume EF LV IVS:        1.30 cm         LV 3D EF:    Left LVOT diam:     2.00 cm  ventricular LV SV:         32                           ejection LV SV Index:   17                           fraction by LVOT Area:     3.14 cm                     3D volume                                             is 21 %.                                 3D Volume EF:                                3D EF:        21 % RIGHT VENTRICLE RV S prime:     5.36 cm/s TAPSE (M-mode): 1.3 cm LEFT ATRIUM              Index       RIGHT ATRIUM           Index LA diam:        4.60 cm  2.44 cm/m  RA Area:     25.00 cm LA Vol (A2C):   112.0 ml 59.47 ml/m RA Volume:   80.80 ml  42.90 ml/m LA Vol (A4C):   65.8 ml  34.94 ml/m LA Biplane Vol: 86.6 ml  45.98 ml/m  AORTIC VALVE LVOT Vmax:   64.40 cm/s LVOT Vmean:  42.000 cm/s LVOT VTI:    0.102 m  AORTA Ao Root diam: 3.60 cm Ao Asc diam:  3.70 cm TRICUSPID VALVE TR Peak grad:   26.4 mmHg TR Vmax:        257.00 cm/s  SHUNTS Systemic VTI:  0.10 m Systemic Diam: 2.00 cm Cherlynn Kaiser MD  Electronically signed by Cherlynn Kaiser MD Signature Date/Time: 08/07/2019/12:31:38 PM    Final     Subjective: No acute issues or events overnight, patient feels quite well, denies nausea, vomiting, diarrhea, constipation, headache, fevers, chills.  Discharge Exam: Vitals:   08/11/19 0323 08/11/19 1033  BP: (!) 124/91 118/83  Pulse: 82 78  Resp: 16 18  Temp: 98.2 F (36.8 C) 98.1 F (36.7 C)  SpO2: 95% 100%   Vitals:   08/10/19 2303 08/11/19 0323 08/11/19 0500 08/11/19 1033  BP: 111/81 (!) 124/91  118/83  Pulse: 85 82  78  Resp:  16  18  Temp:  98.2 F (36.8 C)  98.1 F (36.7 C)  TempSrc:  Oral  Oral  SpO2: 100% 95%  100%  Weight:   74.6 kg   Height:        General:  Pleasantly resting in bed, No acute distress. HEENT:  Normocephalic atraumatic.  Sclerae nonicteric, noninjected.  Extraocular movements intact bilaterally. Neck:  Without mass or deformity.  Trachea is midline. Lungs:  Clear to auscultate bilaterally without rhonchi, wheeze, or rales. Heart:  Regular rate and rhythm.  Without murmurs, rubs, or gallops. Abdomen:  Soft, nontender, nondistended.  Without guarding or rebound. Extremities: Without cyanosis, clubbing, edema, or obvious deformity. Vascular:  Dorsalis pedis and posterior tibial pulses palpable bilaterally. Skin:  Warm and dry, no erythema, no ulcerations.   The results of significant diagnostics from this hospitalization (including imaging, microbiology, ancillary and laboratory) are listed below for reference.     Microbiology: Recent Results (from the past 240 hour(s))  SARS Coronavirus 2 by RT PCR (hospital order, performed in [facility] hospital lab) Nasopharyngeal Urine, Clean Catch     Status: None   Collection Time: 08/06/19  7:50 PM   Specimen: Urine, Clean Catch; Nasopharyngeal  Result Value Ref Range Status   SARS Coronavirus 2 NEGATIVE NEGATIVE Final    Comment: (NOTE) SARS-CoV-2 target nucleic acids are NOT DETECTED.  The  SARS-CoV-2 RNA is generally detectable in upper and lower respiratory specimens during the acute phase of infection. The lowest concentration of SARS-CoV-2 viral copies this assay can detect is 250 copies / mL. A negative result does not preclude SARS-CoV-2 infection and should not be used as the sole basis for treatment or other patient management decisions.  A negative result may occur with improper specimen collection / handling, submission of specimen other than nasopharyngeal swab, presence of viral mutation(s) within the areas targeted by this assay, and inadequate number of viral copies (<250 copies / mL). A negative result must be combined with clinical observations, patient history, and epidemiological information.  Fact Sheet for Patients:   StrictlyIdeas.no  Fact Sheet for Healthcare Providers: BankingDealers.co.za  This test is not yet approved or  cleared by the [place] FDA and has been authorized for detection and/or diagnosis of SARS-CoV-2 by FDA under an Emergency Use Authorization (EUA).  This EUA will remain in effect (meaning this test can be used) for the duration of the COVID-19 declaration under Section 564(b)(1) of the Act, 21 U.S.C. section 360bbb-3(b)(1), unless the authorization is terminated or revoked sooner.  Performed at [facility], [place] [address], [place], [place] [address]   MRSA PCR Screening     Status: None   Collection Time: 08/08/19  2:56 AM   Specimen: Nasopharyngeal  Result Value Ref Range Status   MRSA by PCR NEGATIVE NEGATIVE Final    Comment:        The GeneXpert MRSA Assay (FDA approved for NASAL specimens only), is one component of a comprehensive MRSA colonization surveillance program. It is not intended to diagnose MRSA infection nor to guide or monitor treatment for MRSA infections. Performed at [facility], [place] [address], [place], [place]  [id number]      Labs: BNP (last 3 results) Recent Labs    10/18/18 2355 08/06/19 1642  BNP 204.1* 8,675.4*   Basic Metabolic Panel: Recent Labs  Lab 08/06/19 1642 08/07/19 0449 08/09/19 0417 08/10/19 0214 08/11/19 0334  NA 142 141 140 138 138  K 3.4* 3.6 3.1* 3.5 3.6  CL 106 100 99 99 100  CO2 25 27 31 31 28   GLUCOSE 123* 109* 108* 100* 87  BUN 25* 20 21 29* 31*  CREATININE 1.43* 1.25* 1.42* 1.40* 1.21  CALCIUM 8.7* 9.5 9.1 8.9 8.9   Liver Function Tests: Recent Labs  Lab 08/06/19 1642 08/10/19 0214 08/11/19 0334  AST 53* 15 14*  ALT 65* 29 25  ALKPHOS 89 68 68  BILITOT 1.5* 0.8 1.2  PROT 6.8 5.5* 5.6*  ALBUMIN 3.4* 2.5* 2.7*   No  results for input(s): LIPASE, AMYLASE in the last 168 hours. Recent Labs  Lab 08/09/19 1506 08/10/19 0214  AMMONIA 46* 21   CBC: Recent Labs  Lab 08/06/19 1642 08/09/19 0417 08/10/19 0214 08/11/19 0334  WBC 6.0 7.4 7.6 5.7  HGB 12.9* 13.7 12.4* 12.3*  HCT 39.2 41.1 37.3* 37.6*  MCV 91.6 90.5 90.8 90.8  PLT 183 190 179 211   Cardiac Enzymes: No results for input(s): CKTOTAL, CKMB, CKMBINDEX, TROPONINI in the last 168 hours. BNP: Invalid input(s): POCBNP CBG: No results for input(s): GLUCAP in the last 168 hours. D-Dimer No results for input(s): DDIMER in the last 72 hours. Hgb A1c Recent Labs    08/09/19 0417  HGBA1C 5.9*   Lipid Profile Recent Labs    08/09/19 0417  CHOL 120  HDL 41  LDLCALC 70  TRIG 44  CHOLHDL 2.9   Thyroid function studies No results for input(s): TSH, T4TOTAL, T3FREE, THYROIDAB in the last 72 hours.  Invalid input(s): FREET3 Anemia work up No results for input(s): VITAMINB12, FOLATE, FERRITIN, TIBC, IRON, RETICCTPCT in the last 72 hours. Urinalysis    Component Value Date/Time   COLORURINE YELLOW 08/06/2019 1642   APPEARANCEUR CLEAR 08/06/2019 1642   LABSPEC 1.011 08/06/2019 1642   PHURINE 6.0 08/06/2019 1642   GLUCOSEU NEGATIVE 08/06/2019 1642   HGBUR SMALL (A) 08/06/2019  1642   BILIRUBINUR NEGATIVE 08/06/2019 1642   KETONESUR NEGATIVE 08/06/2019 1642   PROTEINUR NEGATIVE 08/06/2019 1642   UROBILINOGEN 0.2 01/27/2011 1233   NITRITE NEGATIVE 08/06/2019 1642   LEUKOCYTESUR NEGATIVE 08/06/2019 1642   Sepsis Labs Invalid input(s): PROCALCITONIN,  WBC,  LACTICIDVEN Microbiology Recent Results (from the past 240 hour(s))  SARS Coronavirus 2 by RT PCR (hospital order, performed in [facility] hospital lab) Nasopharyngeal Urine, Clean Catch     Status: None   Collection Time: 08/06/19  7:50 PM   Specimen: Urine, Clean Catch; Nasopharyngeal  Result Value Ref Range Status   SARS Coronavirus 2 NEGATIVE NEGATIVE Final    Comment: (NOTE) SARS-CoV-2 target nucleic acids are NOT DETECTED.  The SARS-CoV-2 RNA is generally detectable in upper and lower respiratory specimens during the acute phase of infection. The lowest concentration of SARS-CoV-2 viral copies this assay can detect is 250 copies / mL. A negative result does not preclude SARS-CoV-2 infection and should not be used as the sole basis for treatment or other patient management decisions.  A negative result may occur with improper specimen collection / handling, submission of specimen other than nasopharyngeal swab, presence of viral mutation(s) within the areas targeted by this assay, and inadequate number of viral copies (<250 copies / mL). A negative result must be combined with clinical observations, patient history, and epidemiological information.  Fact Sheet for Patients:   StrictlyIdeas.no  Fact Sheet for Healthcare Providers: BankingDealers.co.za  This test is not yet approved or  cleared by the [place] FDA and has been authorized for detection and/or diagnosis of SARS-CoV-2 by FDA under an Emergency Use Authorization (EUA).  This EUA will remain in effect (meaning this test can be used) for the duration of the COVID-19 declaration under  Section 564(b)(1) of the Act, 21 U.S.C. section 360bbb-3(b)(1), unless the authorization is terminated or revoked sooner.  Performed at [facility], [place] [address], [place], [place] [address]   MRSA PCR Screening     Status: None   Collection Time: 08/08/19  2:56 AM   Specimen: Nasopharyngeal  Result Value Ref Range Status   MRSA  by PCR NEGATIVE NEGATIVE Final    Comment:        The GeneXpert MRSA Assay (FDA approved for NASAL specimens only), is one component of a comprehensive MRSA colonization surveillance program. It is not intended to diagnose MRSA infection nor to guide or monitor treatment for MRSA infections. Performed at [facility], [place] [address], [place], [place] [address]      Time coordinating discharge: Over 30 minutes  SIGNED:  Little Ishikawa, DO [facility] 08/11/2019, 11:24 AM Pager   If 7PM-7AM, please contact night-coverage [email or web address]

## 2019-08-14 ENCOUNTER — Other Ambulatory Visit: Payer: Self-pay | Admitting: *Deleted

## 2019-08-14 NOTE — Patient Outreach (Signed)
[facility]) Care Management  08/14/2019  [name] 08/10/1932 [id number]   RED ON EMMI ALERT - Stroke Day # 1 Date: 8/8 Red Alert Reason: Not scheduled follow up and Not able to take every dose of medications   Outreach attempt #1, successful, identity verified.  This care manager introduced self and stated purpose of call.  [facility] care management services explained..    Member report he feel he is recovering well.  Lives alone but has 3 daughters, a friend, and niece that are all working together to manage his healthcare.  Report he receives meals on wheels but is still able to prepare small meals when needed.  He has follow up appointment with PCP tomorrow and with cardiology on 9/3.  His friend will provide transportation.  Discussed the need to follow up with neurology, offered to call office for appointment, declined offer stating he would be able to do so.    Report his daughters have reviewed all of his medications, confirms he has stopped taking those discontinued and has any new prescriptions.  Per AVS, he is to receive home health through [facility], denies receiving call from them yet.  Call was placed to agency, confirmed they received referral today and will follow up with member later this afternoon or tomorrow.  Member made aware, denies any urgent concerns.  Agrees for this RNCM to follow up on medication management and MD appointments.   Plan: RN CM will follow up with member within the next 2 weeks.  Valente David, [place], MSN [place] [phone]

## 2019-08-28 ENCOUNTER — Other Ambulatory Visit: Payer: Self-pay | Admitting: *Deleted

## 2019-08-28 NOTE — Patient Outreach (Signed)
[facility]) Care Management  08/28/2019  Brian Brown 30-Aug-1932 [id number]   Call placed to member to follow up on ongoing recovery from stroke.  State he is doing well, but still feel like he need more energy.  He has continued working with PT to increase strength, will see PCP today, friend will provide transportation.  Also has appointment with cardiology on 9/3.  Report he has lost some weight but appetite is picking back up, drinking Ensure.  State he still has the support of his friends and family, denies any further needs.  Will close case at this time (not on APL).  Encouraged to reach out to this care manager with questions.  Brian Brown, [place], MSN [place] [phone]

## 2019-08-31 ENCOUNTER — Inpatient Hospital Stay (HOSPITAL_COMMUNITY)
Admission: EM | Admit: 2019-08-31 | Discharge: 2019-09-07 | DRG: 291 | Disposition: A | Discharge status: Home Health | Payer: Medicare HMO | Attending: Internal Medicine | Admitting: Internal Medicine

## 2019-08-31 ENCOUNTER — Encounter (HOSPITAL_COMMUNITY): Payer: Self-pay

## 2019-08-31 ENCOUNTER — Emergency Department (HOSPITAL_COMMUNITY): Payer: Medicare HMO

## 2019-08-31 ENCOUNTER — Other Ambulatory Visit: Payer: Self-pay

## 2019-08-31 DIAGNOSIS — G9341 Metabolic encephalopathy: Secondary | ICD-10-CM | POA: Diagnosis present

## 2019-08-31 DIAGNOSIS — E876 Hypokalemia: Secondary | ICD-10-CM | POA: Diagnosis present

## 2019-08-31 DIAGNOSIS — I252 Old myocardial infarction: Secondary | ICD-10-CM

## 2019-08-31 DIAGNOSIS — R778 Other specified abnormalities of plasma proteins: Secondary | ICD-10-CM | POA: Diagnosis not present

## 2019-08-31 DIAGNOSIS — Z96642 Presence of left artificial hip joint: Secondary | ICD-10-CM | POA: Diagnosis present

## 2019-08-31 DIAGNOSIS — I429 Cardiomyopathy, unspecified: Secondary | ICD-10-CM | POA: Diagnosis present

## 2019-08-31 DIAGNOSIS — I4819 Other persistent atrial fibrillation: Secondary | ICD-10-CM

## 2019-08-31 DIAGNOSIS — Z8679 Personal history of other diseases of the circulatory system: Secondary | ICD-10-CM

## 2019-08-31 DIAGNOSIS — Z7189 Other specified counseling: Secondary | ICD-10-CM

## 2019-08-31 DIAGNOSIS — Z515 Encounter for palliative care: Secondary | ICD-10-CM | POA: Diagnosis not present

## 2019-08-31 DIAGNOSIS — Z9079 Acquired absence of other genital organ(s): Secondary | ICD-10-CM

## 2019-08-31 DIAGNOSIS — R410 Disorientation, unspecified: Secondary | ICD-10-CM

## 2019-08-31 DIAGNOSIS — Z8249 Family history of ischemic heart disease and other diseases of the circulatory system: Secondary | ICD-10-CM | POA: Diagnosis not present

## 2019-08-31 DIAGNOSIS — B1920 Unspecified viral hepatitis C without hepatic coma: Secondary | ICD-10-CM | POA: Diagnosis present

## 2019-08-31 DIAGNOSIS — J9601 Acute respiratory failure with hypoxia: Secondary | ICD-10-CM | POA: Diagnosis present

## 2019-08-31 DIAGNOSIS — Z87891 Personal history of nicotine dependence: Secondary | ICD-10-CM

## 2019-08-31 DIAGNOSIS — G4733 Obstructive sleep apnea (adult) (pediatric): Secondary | ICD-10-CM | POA: Diagnosis present

## 2019-08-31 DIAGNOSIS — I480 Paroxysmal atrial fibrillation: Secondary | ICD-10-CM | POA: Diagnosis present

## 2019-08-31 DIAGNOSIS — I130 Hypertensive heart and chronic kidney disease with heart failure and stage 1 through stage 4 chronic kidney disease, or unspecified chronic kidney disease: Principal | ICD-10-CM | POA: Diagnosis present

## 2019-08-31 DIAGNOSIS — E785 Hyperlipidemia, unspecified: Secondary | ICD-10-CM | POA: Diagnosis present

## 2019-08-31 DIAGNOSIS — G9340 Encephalopathy, unspecified: Secondary | ICD-10-CM | POA: Diagnosis not present

## 2019-08-31 DIAGNOSIS — F0390 Unspecified dementia without behavioral disturbance: Secondary | ICD-10-CM | POA: Diagnosis present

## 2019-08-31 DIAGNOSIS — I2510 Atherosclerotic heart disease of native coronary artery without angina pectoris: Secondary | ICD-10-CM | POA: Diagnosis present

## 2019-08-31 DIAGNOSIS — Z8546 Personal history of malignant neoplasm of prostate: Secondary | ICD-10-CM

## 2019-08-31 DIAGNOSIS — I69351 Hemiplegia and hemiparesis following cerebral infarction affecting right dominant side: Secondary | ICD-10-CM

## 2019-08-31 DIAGNOSIS — E872 Acidosis, unspecified: Secondary | ICD-10-CM

## 2019-08-31 DIAGNOSIS — I255 Ischemic cardiomyopathy: Secondary | ICD-10-CM | POA: Diagnosis present

## 2019-08-31 DIAGNOSIS — I5023 Acute on chronic systolic (congestive) heart failure: Secondary | ICD-10-CM | POA: Diagnosis not present

## 2019-08-31 DIAGNOSIS — Z8616 Personal history of COVID-19: Secondary | ICD-10-CM | POA: Diagnosis not present

## 2019-08-31 DIAGNOSIS — Z09 Encounter for follow-up examination after completed treatment for conditions other than malignant neoplasm: Secondary | ICD-10-CM

## 2019-08-31 DIAGNOSIS — N182 Chronic kidney disease, stage 2 (mild): Secondary | ICD-10-CM | POA: Diagnosis present

## 2019-08-31 DIAGNOSIS — I25118 Atherosclerotic heart disease of native coronary artery with other forms of angina pectoris: Secondary | ICD-10-CM | POA: Diagnosis not present

## 2019-08-31 DIAGNOSIS — I248 Other forms of acute ischemic heart disease: Secondary | ICD-10-CM | POA: Diagnosis present

## 2019-08-31 DIAGNOSIS — I5021 Acute systolic (congestive) heart failure: Secondary | ICD-10-CM | POA: Diagnosis present

## 2019-08-31 DIAGNOSIS — Z79899 Other long term (current) drug therapy: Secondary | ICD-10-CM

## 2019-08-31 DIAGNOSIS — I5043 Acute on chronic combined systolic (congestive) and diastolic (congestive) heart failure: Secondary | ICD-10-CM | POA: Diagnosis present

## 2019-08-31 DIAGNOSIS — I361 Nonrheumatic tricuspid (valve) insufficiency: Secondary | ICD-10-CM | POA: Diagnosis not present

## 2019-08-31 DIAGNOSIS — R4182 Altered mental status, unspecified: Secondary | ICD-10-CM

## 2019-08-31 DIAGNOSIS — I340 Nonrheumatic mitral (valve) insufficiency: Secondary | ICD-10-CM | POA: Diagnosis not present

## 2019-08-31 HISTORY — DX: Unspecified systolic (congestive) heart failure: I50.20

## 2019-08-31 HISTORY — DX: Ischemic cardiomyopathy: I25.5

## 2019-08-31 LAB — URINALYSIS, ROUTINE W REFLEX MICROSCOPIC
Bilirubin Urine: NEGATIVE
Glucose, UA: NEGATIVE mg/dL
Ketones, ur: NEGATIVE mg/dL
Leukocytes,Ua: NEGATIVE
Nitrite: NEGATIVE
Protein, ur: 100 mg/dL — AB
Specific Gravity, Urine: 1.025 (ref 1.005–1.030)
pH: 5 (ref 5.0–8.0)

## 2019-08-31 LAB — CBC
HCT: 42.5 % (ref 39.0–52.0)
Hemoglobin: 13.7 g/dL (ref 13.0–17.0)
MCH: 30.1 pg (ref 26.0–34.0)
MCHC: 32.2 g/dL (ref 30.0–36.0)
MCV: 93.4 fL (ref 80.0–100.0)
Platelets: 187 10*3/uL (ref 150–400)
RBC: 4.55 MIL/uL (ref 4.22–5.81)
RDW: 14.5 % (ref 11.5–15.5)
WBC: 6.9 10*3/uL (ref 4.0–10.5)
nRBC: 0 % (ref 0.0–0.2)

## 2019-08-31 LAB — BASIC METABOLIC PANEL
Anion gap: 13 (ref 5–15)
BUN: 20 mg/dL (ref 8–23)
CO2: 24 mmol/L (ref 22–32)
Calcium: 9.2 mg/dL (ref 8.9–10.3)
Chloride: 103 mmol/L (ref 98–111)
Creatinine, Ser: 1.2 mg/dL (ref 0.61–1.24)
GFR calc Af Amer: >60 mL/min (ref >60)
GFR calc non Af Amer: 55 mL/min — ABNORMAL LOW (ref >60)
Glucose, Bld: 122 mg/dL — ABNORMAL HIGH (ref 70–99)
Potassium: 3.9 mmol/L (ref 3.5–5.1)
Sodium: 140 mmol/L (ref 135–145)

## 2019-08-31 LAB — LACTIC ACID, PLASMA
Lactic Acid, Venous: 2.6 mmol/L (ref 0.5–1.9)
Lactic Acid, Venous: 4.6 mmol/L (ref 0.5–1.9)

## 2019-08-31 LAB — HEPATIC FUNCTION PANEL
ALT: 41 U/L (ref 0–44)
AST: 33 U/L (ref 15–41)
Albumin: 3.5 g/dL (ref 3.5–5.0)
Alkaline Phosphatase: 103 U/L (ref 38–126)
Bilirubin, Direct: 0.5 mg/dL — ABNORMAL HIGH (ref 0.0–0.2)
Indirect Bilirubin: 1.6 mg/dL — ABNORMAL HIGH (ref 0.3–0.9)
Total Bilirubin: 2.1 mg/dL — ABNORMAL HIGH (ref 0.3–1.2)
Total Protein: 7.6 g/dL (ref 6.5–8.1)

## 2019-08-31 LAB — BRAIN NATRIURETIC PEPTIDE: B Natriuretic Peptide: 1344.2 pg/mL — ABNORMAL HIGH (ref 0.0–100.0)

## 2019-08-31 LAB — TROPONIN I (HIGH SENSITIVITY)
Troponin I (High Sensitivity): 56 ng/L — ABNORMAL HIGH (ref <18)
Troponin I (High Sensitivity): 51 ng/L — ABNORMAL HIGH (ref <18)

## 2019-08-31 LAB — SARS CORONAVIRUS 2 BY RT PCR (HOSPITAL ORDER, PERFORMED IN ~~LOC~~ HOSPITAL LAB): SARS Coronavirus 2: NEGATIVE

## 2019-08-31 MED ORDER — HYDRALAZINE HCL 20 MG/ML IJ SOLN
5.0000 mg | Freq: Once | INTRAMUSCULAR | Status: DC
  Filled 2019-08-31: qty 1

## 2019-08-31 MED ORDER — DROPERIDOL 2.5 MG/ML IJ SOLN: 5.0000 mg | Freq: Once | INTRAMUSCULAR | Status: DC

## 2019-08-31 MED ORDER — SODIUM CHLORIDE 0.9 % IV SOLN: 250.0000 mL | INTRAVENOUS | Status: DC | PRN

## 2019-08-31 MED ORDER — ATORVASTATIN CALCIUM 40 MG PO TABS
40.0000 mg | ORAL_TABLET | Freq: Every day | ORAL | Status: DC
  Administered 2019-09-02 – 2019-09-06 (×5): 40 mg via ORAL
  Filled 2019-08-31 (×6): qty 1

## 2019-08-31 MED ORDER — LORAZEPAM 2 MG/ML IJ SOLN: 2.0000 mg | Freq: Once | INTRAMUSCULAR | Status: DC

## 2019-08-31 MED ORDER — ONDANSETRON HCL 4 MG/2ML IJ SOLN: 4.0000 mg | Freq: Four times a day (QID) | INTRAMUSCULAR | Status: DC | PRN

## 2019-08-31 MED ORDER — FAMOTIDINE 20 MG PO TABS
20.0000 mg | ORAL_TABLET | Freq: Every day | ORAL | Status: DC
  Administered 2019-09-02 – 2019-09-06 (×5): 20 mg via ORAL
  Filled 2019-08-31 (×6): qty 1

## 2019-08-31 MED ORDER — SODIUM CHLORIDE 0.9% FLUSH: 3.0000 mL | INTRAVENOUS | Status: DC | PRN

## 2019-08-31 MED ORDER — ASPIRIN 81 MG PO CHEW
324.0000 mg | CHEWABLE_TABLET | Freq: Once | ORAL | Status: AC
  Administered 2019-08-31: 324 mg via ORAL
  Filled 2019-08-31: qty 4

## 2019-08-31 MED ORDER — LOSARTAN POTASSIUM 50 MG PO TABS
25.0000 mg | ORAL_TABLET | Freq: Once | ORAL | Status: AC
  Administered 2019-08-31: 25 mg via ORAL
  Filled 2019-08-31: qty 1

## 2019-08-31 MED ORDER — SODIUM CHLORIDE 0.9% FLUSH
3.0000 mL | Freq: Two times a day (BID) | INTRAVENOUS | Status: DC
  Administered 2019-09-01 – 2019-09-06 (×10): 3 mL via INTRAVENOUS

## 2019-08-31 MED ORDER — LOSARTAN POTASSIUM 25 MG PO TABS
25.0000 mg | ORAL_TABLET | Freq: Every day | ORAL | Status: DC
  Administered 2019-09-01 – 2019-09-04 (×4): 25 mg via ORAL
  Filled 2019-08-31 (×4): qty 1

## 2019-08-31 MED ORDER — ACETAMINOPHEN 325 MG PO TABS: 650.0000 mg | ORAL_TABLET | ORAL | Status: DC | PRN

## 2019-08-31 MED ORDER — FUROSEMIDE 10 MG/ML IJ SOLN
40.0000 mg | Freq: Once | INTRAMUSCULAR | Status: AC
  Administered 2019-08-31: 40 mg via INTRAVENOUS
  Filled 2019-08-31: qty 4

## 2019-08-31 MED ORDER — METOPROLOL SUCCINATE ER 25 MG PO TB24
25.0000 mg | ORAL_TABLET | Freq: Once | ORAL | Status: AC
  Administered 2019-08-31: 25 mg via ORAL
  Filled 2019-08-31: qty 1

## 2019-08-31 MED ORDER — LABETALOL HCL 5 MG/ML IV SOLN
INTRAVENOUS | Status: AC
  Administered 2019-08-31: 10 mg via INTRAVENOUS
  Filled 2019-08-31: qty 4

## 2019-08-31 MED ORDER — LABETALOL HCL 5 MG/ML IV SOLN: 10.0000 mg | Freq: Once | INTRAVENOUS | Status: AC

## 2019-08-31 MED ORDER — ASPIRIN 81 MG PO TBEC: 81.0000 mg | DELAYED_RELEASE_TABLET | Freq: Every day | ORAL | Status: DC

## 2019-08-31 MED ORDER — FUROSEMIDE 10 MG/ML IJ SOLN: 40.0000 mg | Freq: Every day | INTRAMUSCULAR | Status: DC

## 2019-08-31 MED ORDER — METOPROLOL SUCCINATE ER 25 MG PO TB24
25.0000 mg | ORAL_TABLET | Freq: Two times a day (BID) | ORAL | Status: DC
  Administered 2019-09-01: 25 mg via ORAL
  Filled 2019-08-31: qty 1

## 2019-08-31 NOTE — ED Triage Notes (Signed)
Pt reports feeling "out of whack" for more than 2 months. States I'm feel weak and fatigued

## 2019-08-31 NOTE — ED Notes (Signed)
Pt SpO2 decreased to 87% on RA, RN applied 2L O2 via [place], SpO2 now 93%

## 2019-08-31 NOTE — ED Provider Notes (Signed)
[place] EMERGENCY DEPARTMENT Provider Note   CSN: [id number] Arrival date & time: 08/31/19  0750     History Chief Complaint  Patient presents with  . Fatigue  . Weakness    Brian Brown is a 86 y.o. male history of CVA, paroxysmal A. fib, hypertension, CKD, cardiomyopathy with last EF 20% presents the ED today for fatigue, weakness progressively worsening over 2 months as well as a sensation of heart fluttering that he complained of last night family.  Patient is unfortunately waited numerous hours in the waiting room due to significantly prolonged wait times, on arrival states he has been fatigued only is requesting to leave.  Collateral history obtained from the patient's daughter, she states that the patient noted some chest discomfort early this morning for which he took nitroglycerin with some improvement and presented to the ED.  The history is provided by the patient.  Illness Quality:  Fatigue Severity:  Severe Onset quality:  Gradual Duration:  2 months Timing:  Constant Progression:  Worsening Chronicity:  New Context:  Known heart failure, increased leg swelling, chest discomfort Associated symptoms: chest pain, fatigue and shortness of breath   Associated symptoms: no abdominal pain, no cough, no fever, no headaches, no rash and no vomiting        Past Medical History:  Diagnosis Date  . Arthritis    degenerative joint disease  . Bleeding stomach ulcer 25 yrs ago  . CAD (coronary artery disease)    a. NSTEMI 10/2018 thrombotic lesion to OM2, 90% D1 (medical therapy), otherwise nonobstructive disease.  . Cancer [facility]) 2002   prostate, s/p surgery  . Cardiomyopathy ([place])    a. EF 45-50 by echo 10/2018.  Marland Kitchen CKD (chronic kidney disease), stage II   . Hepatitis 1981   does not know what kind;denies jaundice  . Hypertension    treated by Dr Delfina Redwood, primary MD  . Mild dilation of ascending aorta ([place])   . PAF (paroxysmal atrial  fibrillation) ([place])   . Sleep apnea 2005   does not wear a Cpap  . Stroke ([place]) 2003   w/ Rt sided weakness-some-rt leg  . Wears dentures    full top  . Wears glasses     Patient Active Problem List   Diagnosis Date Noted  . Acute systolic heart failure ([place]) 08/31/2019  . Protein-calorie malnutrition, severe 08/08/2019  . Intracerebral hemorrhage ([place]) 08/07/2019  . Acute CHF (congestive heart failure) ([place]) 08/06/2019  . AKI (acute kidney injury) ([place]) 08/06/2019  . Elevated troponin 08/06/2019  . Transaminitis 08/06/2019  . Closed rib fracture 08/06/2019  . History of CVA (cerebrovascular accident) 08/06/2019  . PAF (paroxysmal atrial fibrillation) ([place]) 10/20/2018  . NSTEMI (non-ST elevated myocardial infarction) ([place])   . Acute CVA (cerebrovascular accident) ([place]) 02/14/2018  . TIA (transient ischemic attack) 02/13/2018  . Hepatitis C reactive 02/06/2011  . Hyponatremia 02/06/2011  . Postoperative anemia due to acute blood loss 02/05/2011  . Hypokalemia 02/05/2011  . Osteoarthritis of hip 01/27/2011  . Hypertension 01/27/2011  . Sleep apnea 01/27/2011  . Stroke/cerebrovascular accident ([place]) 01/27/2011  . Cancer of prostate ([place]) 01/27/2011    Past Surgical History:  Procedure Laterality Date  . BUBBLE STUDY  09/16/2018   Procedure: BUBBLE STUDY;  Surgeon: Lelon Perla, MD;  Location: [place];  Service: Cardiovascular;;  . CARDIAC CATHETERIZATION  10/19/2018  . CARPAL TUNNEL RELEASE Left 03/29/2013   Procedure: LEFT CARPAL TUNNEL RELEASE;  Surgeon: Wynonia Sours, MD;  Location: [place];  Service: Orthopedics;  Laterality: Left;  ANESTHESIA: IV REGIONAL FAB  . CARPAL TUNNEL RELEASE Right 03/06/2014   Procedure: RIGHT CARPAL TUNNEL RELEASE;  Surgeon: Daryll Brod, MD;  Location: [place];  Service: Orthopedics;  Laterality: Right;  . COLONOSCOPY    . HERNIA REPAIR  2009   rt inguinal hernia  . JOINT REPLACEMENT  12/2001   rt  hip ;[place] CATH AND CORONARY ANGIOGRAPHY N/A 10/19/2018   Procedure: LEFT HEART CATH AND CORONARY ANGIOGRAPHY;  Surgeon: Wellington Hampshire, MD;  Location: [place] CV LAB;  Service: Cardiovascular;  Laterality: N/A;  . LOOP RECORDER INSERTION N/A 09/16/2018   Procedure: LOOP RECORDER INSERTION;  Surgeon: Constance Haw, MD;  Location: [place] CV LAB;  Service: Cardiovascular;  Laterality: N/A;  . PROSTATECTOMY  2002  . SHOULDER ARTHROSCOPY W/ ROTATOR CUFF REPAIR  2014   lt-dr whitfield  . TEE WITHOUT CARDIOVERSION N/A 09/16/2018   Procedure: TRANSESOPHAGEAL ECHOCARDIOGRAM (TEE);  Surgeon: Lelon Perla, MD;  Location: [facility] ENDOSCOPY;  Service: Cardiovascular;  Laterality: N/A;  . TOTAL HIP ARTHROPLASTY  02/03/2011   Procedure: TOTAL HIP ARTHROPLASTY;  Surgeon: Garald Balding, MD;  Location: [place];  Service: Orthopedics;  Laterality: Left;  . TRIGGER FINGER RELEASE Left 03/29/2013   Procedure: LEFT MIDDLE/SMALL FINGER RELEASE A-1 PULLEY AND LEFT RING FINGER;  Surgeon: Wynonia Sours, MD;  Location: [place];  Service: Orthopedics;  Laterality: Left;  . TRIGGER FINGER RELEASE Right 03/06/2014   Procedure: RIGHT MIDDLE FINGER,RIGHT RINGER FINGER;  Surgeon: Daryll Brod, MD;  Location: [place];  Service: Orthopedics;  Laterality: Right;       Family History  Problem Relation Age of Onset  . Heart disease Mother   . Heart disease Father     Social History   Tobacco Use  . Smoking status: Former Smoker    Packs/day: 1.00    Years: 30.00    Pack years: 30.00    Types: Cigarettes    Quit date: 01/27/2008    Years since quitting: 11.6  . Smokeless tobacco: Never Used  Vaping Use  . Vaping Use: Never used  Substance Use Topics  . Alcohol use: No  . Drug use: No    Home Medications Prior to Admission medications   Medication Sig Start Date End Date Taking? Authorizing Provider  aspirin EC 81 MG EC tablet Take 1  tablet (81 mg total) by mouth daily. Swallow whole. 08/11/19  Yes Little Ishikawa, MD  atorvastatin (LIPITOR) 40 MG tablet TAKE 1 TABLET EVERY DAY Patient taking differently: Take 40 mg by mouth at bedtime.  07/24/19  Yes Jettie Booze, MD  famotidine (PEPCID) 20 MG tablet Take 1 tablet (20 mg total) by mouth at bedtime. 08/11/19  Yes Little Ishikawa, MD  ibuprofen (ADVIL) 200 MG tablet Take 200 mg by mouth daily as needed for headache.   Yes [provider]  latanoprost (XALATAN) 0.005 % ophthalmic solution Place 1 drop into both eyes at bedtime.   Yes [provider]  losartan (COZAAR) 25 MG tablet Take 1 tablet (25 mg total) by mouth daily. 08/11/19  Yes Little Ishikawa, MD  Multiple Vitamin (MULTIVITAMIN WITH MINERALS) TABS tablet Take 1 tablet by mouth daily. 08/11/19  Yes Little Ishikawa, MD  nitroGLYCERIN (NITROSTAT) 0.4 MG SL tablet Place 1 tablet (0.4 mg total) under the tongue every 5 (five) minutes x  3 doses as needed for chest pain. 11/22/18  Yes Jettie Booze, MD  metoprolol succinate (TOPROL-XL) 25 MG 24 hr tablet Take 1 tablet (25 mg total) by mouth 2 (two) times daily with a meal. 08/11/19   Little Ishikawa, MD  OVER THE COUNTER MEDICATION Take 2-3 capsules by mouth See admin instructions. Omega XL - take 3 capsules by mouth every morning and 2 capsules at night    [provider]    Allergies    Patient has no known allergies.  Review of Systems   Review of Systems  Constitutional: Positive for activity change and fatigue. Negative for chills and fever.  HENT: Negative for facial swelling and voice change.   Eyes: Negative for redness and visual disturbance.  Respiratory: Positive for shortness of breath. Negative for cough.   Cardiovascular: Positive for chest pain and leg swelling. Negative for palpitations.  Gastrointestinal: Negative for abdominal pain and vomiting.  Genitourinary: Negative for difficulty urinating  and dysuria.  Musculoskeletal: Negative for gait problem and joint swelling.  Skin: Negative for rash and wound.  Neurological: Positive for weakness. Negative for dizziness and headaches.  Psychiatric/Behavioral: Negative for confusion and suicidal ideas.    Physical Exam Updated Vital Signs BP (!) 141/106   Pulse (!) 104   Temp 97.6 F (36.4 C) (Oral)   Resp (!) 23   Ht 5\' 8"  (1.727 m)   Wt 77.1 kg   SpO2 99%   BMI 25.85 kg/m   Physical Exam Constitutional:      General: He is not in acute distress. HENT:     Head: Normocephalic and atraumatic.     Mouth/Throat:     Mouth: Mucous membranes are moist.     Pharynx: Oropharynx is clear.  Eyes:     General: No scleral icterus.    Pupils: Pupils are equal, round, and reactive to light.  Cardiovascular:     Rate and Rhythm: Normal rate and regular rhythm.     Pulses: Normal pulses.     Comments: Significant JVD appreciable Pulmonary:     Effort: Pulmonary effort is normal. No respiratory distress.     Breath sounds: Rales present.  Abdominal:     General: There is no distension.     Tenderness: There is no abdominal tenderness.  Musculoskeletal:        General: No tenderness or deformity.     Cervical back: Normal range of motion and neck supple.     Right lower leg: Edema present.     Left lower leg: Edema present.  Neurological:     General: No focal deficit present.     Mental Status: He is alert and oriented to person, place, and time.  Psychiatric:        Mood and Affect: Mood normal.        Behavior: Behavior normal.     ED Results / Procedures / Treatments   Labs (all labs ordered are listed, but only abnormal results are displayed) Labs Reviewed  BASIC METABOLIC PANEL - Abnormal; Notable for the following components:      Result Value   Glucose, Bld 122 (*)    GFR calc non Af Amer 55 (*)    All other components within normal limits  URINALYSIS, ROUTINE W REFLEX MICROSCOPIC - Abnormal; Notable for  the following components:   Color, Urine AMBER (*)    APPearance HAZY (*)    Hgb urine dipstick SMALL (*)    Protein, ur  100 (*)    Bacteria, UA RARE (*)    All other components within normal limits  BRAIN NATRIURETIC PEPTIDE - Abnormal; Notable for the following components:   B Natriuretic Peptide 1,344.2 (*)    All other components within normal limits  LACTIC ACID, PLASMA - Abnormal; Notable for the following components:   Lactic Acid, Venous 2.6 (*)    All other components within normal limits  LACTIC ACID, PLASMA - Abnormal; Notable for the following components:   Lactic Acid, Venous 4.6 (*)    All other components within normal limits  HEPATIC FUNCTION PANEL - Abnormal; Notable for the following components:   Total Bilirubin 2.1 (*)    Bilirubin, Direct 0.5 (*)    Indirect Bilirubin 1.6 (*)    All other components within normal limits  TROPONIN I (HIGH SENSITIVITY) - Abnormal; Notable for the following components:   Troponin I (High Sensitivity) 56 (*)    All other components within normal limits  TROPONIN I (HIGH SENSITIVITY) - Abnormal; Notable for the following components:   Troponin I (High Sensitivity) 51 (*)    All other components within normal limits  SARS CORONAVIRUS 2 BY RT PCR (HOSPITAL ORDER, [facility])  CBC  LACTIC ACID, PLASMA  BASIC METABOLIC PANEL  CBG MONITORING, ED    EKG EKG Interpretation  Date/Time:  Thursday August 31 2019 07:53:53 EDT Ventricular Rate:  91 PR Interval:    QRS Duration: 76 QT Interval:  368 QTC Calculation: 452 R Axis:   -95 Text Interpretation: Atrial fibrillation with premature ventricular or aberrantly conducted complexes Indeterminate axis Low voltage QRS Septal infarct , age undetermined Abnormal ECG No acute changes No significant change since last tracing Confirmed by Varney Biles [phone]) on 08/31/2019 6:13:08 PM   Radiology DG Chest 1 View  Result Date: 08/31/2019 CLINICAL DATA:   Shortness of breath and chest discomfort for 2 weeks. EXAM: CHEST  1 VIEW COMPARISON:  Radiograph and CT 08/06/2019 FINDINGS: Loop recorder projects over the left chest wall. Cardiomegaly with slight progression. Unchanged mediastinal contours. Increase in hazy bilateral lung base opacities likely combination of pleural effusion and atelectasis/airspace disease. No evidence of pulmonary edema. No pneumothorax. No acute osseous abnormalities are seen. IMPRESSION: Increase in hazy bilateral lung base opacities likely combination of pleural effusions and atelectasis/airspace disease. Pneumonia is also considered. Cardiomegaly is slightly increased. Overall findings suggest fluid overload. Electronically Signed   By: Keith Rake M.D.   On: 08/31/2019 18:49    Procedures Procedures (including critical care time)  Medications Ordered in ED Medications  sodium chloride flush (NS) 0.9 % injection 3 mL (has no administration in time range)  sodium chloride flush (NS) 0.9 % injection 3 mL (has no administration in time range)  0.9 %  sodium chloride infusion (has no administration in time range)  acetaminophen (TYLENOL) tablet 650 mg (has no administration in time range)  ondansetron (ZOFRAN) injection 4 mg (has no administration in time range)  furosemide (LASIX) injection 40 mg (has no administration in time range)  losartan (COZAAR) tablet 25 mg (25 mg Oral Given 08/31/19 1907)  metoprolol succinate (TOPROL-XL) 24 hr tablet 25 mg (25 mg Oral Given 08/31/19 1908)  furosemide (LASIX) injection 40 mg (40 mg Intravenous Given 08/31/19 1909)  aspirin chewable tablet 324 mg (324 mg Oral Given 08/31/19 1906)  labetalol (NORMODYNE) injection 10 mg (10 mg Intravenous Given 08/31/19 2144)    ED Course  I have reviewed the triage vital signs  and the nursing notes.  Pertinent labs & imaging results that were available during my care of the patient were reviewed by me and considered in my medical decision making  (see chart for details).  Clinical Course as of Aug 30 2257  Thu Aug 31, 2019  2112 SARS Coronavirus 2: NEGATIVE [JR]    Clinical Course User Index [JR] Renold Genta, MD   MDM Rules/Calculators/A&P                          Differential diagnosis considered: CHF exacerbation, cardiogenic shock, infection, sepsis, pneumonia, hypertensive emergency, CVA  Patient presenting to the ED today with complaint of generalized weakness as well as some chest discomfort earlier this morning, unfortunately the patient was not complaining of chest discomfort on presentation to triage and a chest pain work-up is not initiated on him.  Vitals exam notable for severe hypertension with diastolics in the 416L as well as mild tachycardia and his exam is concerning for significant volume overload.  Triage vitals reviewed largely unremarkable, will add on LFTs in the setting of possible cardiogenic shock though low suspicion for this.  We will add a lactate as well.  Given the setting of altered mental status will obtain urine studies in the setting of volume overload/chest pain will obtain BNP, troponin, chest x-ray will administer aspirin.  EKG findings by my read: Compared to prior: 08/08/2019.  Rate: 91 rhythm: Atrial fibrillation Axis: appropriate  PR: N/A QRS: 76 QTc: 452.  Atrial fibrillation, otherwise no evidence of ischemia or arrhythmia, nor any other pathologic findings concerning considering patient presentation. Findings discussed with attending who agrees.  BNP significantly elevated, troponin elevated at 50, not significantly changed from the patient's last presentation for CHF exacerbation relatively low suspicion for type I NSTEMI  Do feel the patient would benefit from admission and IV diuresis.  40 mg of IV Lasix ordered  Patient remains somewhat confused and this could be related to the patient's prolonged weight, lack of food and medical illness, low suspicion for primary neurological  disturbance given otherwise normal neurological exam without localizing deficits.  Patient requesting to leave [place] although at this time concerned that he does not have capacity.  Was able to to contact family who was able to talk the patient into staying, some droperidol ordered but not administered.  Patient given home antihypertensives and a single push labetalol with improvement in blood pressures.  Hospital medicine consulted for admission agreed with the patient their service, will add on CT head per the request.  Final Clinical Impression(s) / ED Diagnoses Final diagnoses:  Acute on chronic systolic congestive heart failure ([place])  Encephalopathy acute    Rx / DC Orders ED Discharge Orders    None     Labs, studies and imaging reviewed by myself and considered in medical decision making if ordered. Imaging interpreted by radiology. Pt was discussed with my attending, Dr. Kathrynn Humble  Electronically signed by:  Roderic Palau [place]8/26/202110:59 PM       Renold Genta, MD 08/31/19 [id number]    Varney Biles, MD 09/02/19 [phone]

## 2019-08-31 NOTE — ED Provider Notes (Signed)
Assisting with waiting room triage.  Patient has been here for 9+ hours.  Fatigue, weakness, leg swelling, Increased BNP.  Will go back for evaluation hopefully soon.  Eilene Ghazi, MD   Judeth Horn, MD 08/31/19 [phone]

## 2019-08-31 NOTE — Plan of Care (Signed)
Called by ED provider Dr. Lin Landsman regarding abnormal CT head reading.  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 Currently has no focal neurological deficits per the ED provider. Recommend repeating CT head in 4 weeks or if renal function permits, can obtain MRI brain with and without contrast to better visualize any underlying vascular malformations which might of previously been missed due to acute blood obscuring an underlying malformation.  Please call with questions.  -- Amie Portland, MD [facility] Pager: [phone] If 7pm to 7am, please call on call as listed on AMION.

## 2019-08-31 NOTE — H&P (Signed)
History and Physical    [name] TKW:[id number] DOB: [birth year] DOA: 08/31/2019  PCP: Seward Carol, MD  Patient coming from: Home  I have personally briefly reviewed patient's old medical records in [place]  Chief Complaint: chest pain, shortness of breath  HPI: Brian Brown is a 86 y.o. male with medical history significant for CVA w/ R sided weakness, CAD s/p NSTEMI, cardiomyopathy, paroxysmal atrial fibrillation off of Eliquis due to [name], OSA not on CPAP, hypertension, hepatitis C who presents with concerns of chest pain and increasing shortness of breath.  Patient overall and unreliable historian.  Niece at bedside also only able to provide limited history.  Patient normally lives alone and has family members to come to check on him.  They were concerned because he has been having increasing shortness of breath and having some altered mental status.  He was recently hospitalized with acute on chronic systolic CHF and since returning home he says he has been feeling some discomfort to his chest and upper abdomen.  However denies any nausea or vomiting.  Patient was hospitalized from 8/1-8/6 for progressive fatigue and was found to be in acute on chronic systolic heart failure.  His EF had decreased from 45 to 50% down to 21%.  Hospital course was also complicated for acute mental status change and he was found to have a right lateral thalamic intracranial hemorrhage.  His Eliquis for atrial fibrillation and Plavix was discontinued which his family confirms he has not been taking since.  He continues to take aspirin per neurology.  He also was not sent home on any diuretics for some unknown reason.   ED Course: He was afebrile, mildly tachycardic and hypertensive up to systolic of 924Q over 683M.  Also later noted to have hypoxia with oxygen saturation down to 87% and was placed on 2 L.  BNP significantly elevated to 1244.  Mildly elevated troponin up to 56 and then 51.   Also noted to have elevated lactate of 2.6 and up to 4.6.  CBC unremarkable and BMP.  Chest x-ray shows increase in hazy bilateral lung base opacity and cardiomegaly suggestive of fluid overload.  Review of Systems:  Unable to fully obtain given his altered mental status  Past Medical History:  Diagnosis Date  . Arthritis    degenerative joint disease  . Bleeding stomach ulcer 25 yrs ago  . CAD (coronary artery disease)    a. NSTEMI 10/2018 thrombotic lesion to OM2, 90% D1 (medical therapy), otherwise nonobstructive disease.  . Cancer [name]) 2002   prostate, s/p surgery  . Cardiomyopathy ([place])    a. EF 45-50 by echo 10/2018.  Mar[place] Kitchen CKD (chronic kidney disease), stage II   . Hepatitis 1981   does not know what kind;denies jaundice  . Hypertension    treated by Dr Delfina Redwood, primary MD  . Mild dilation of ascending aorta ([place])   . PAF (paroxysmal atrial fibrillation) ([place])   . Sleep apnea 2005   does not wear a Cpap  . Stroke ([place]) 2003   w/ Rt sided weakness-some-rt leg  . Wears dentures    full top  . Wears glasses     Past Surgical History:  Procedure Laterality Date  . BUBBLE STUDY  09/16/2018   Procedure: BUBBLE STUDY;  Surgeon: Lelon Perla, MD;  Location: [place];  Service: Cardiovascular;;  . CARDIAC CATHETERIZATION  10/19/2018  . CARPAL TUNNEL RELEASE Left 03/29/2013   Procedure: LEFT CARPAL TUNNEL RELEASE;  Surgeon:  Wynonia Sours, MD;  Location: [place];  Service: Orthopedics;  Laterality: Left;  ANESTHESIA: IV REGIONAL FAB  . CARPAL TUNNEL RELEASE Right 03/06/2014   Procedure: RIGHT CARPAL TUNNEL RELEASE;  Surgeon: Daryll Brod, MD;  Location: [place];  Service: Orthopedics;  Laterality: Right;  . COLONOSCOPY    . HERNIA REPAIR  2009   rt inguinal hernia  . JOINT REPLACEMENT  12/2001   rt hip ;[place] CATH AND CORONARY ANGIOGRAPHY N/A 10/19/2018   Procedure: LEFT HEART CATH AND CORONARY ANGIOGRAPHY;   Surgeon: Wellington Hampshire, MD;  Location: [place] CV [facility];  Service: Cardiovascular;  Laterality: N/A;  . LOOP RECORDER INSERTION N/A 09/16/2018   Procedure: LOOP RECORDER INSERTION;  Surgeon: Constance Haw, MD;  Location: [place] [facility];  Service: Cardiovascular;  Laterality: N/A;  . PROSTATECTOMY  2002  . SHOULDER ARTHROSCOPY W/ ROTATOR CUFF REPAIR  2014   lt-dr whitfield  . TEE WITHOUT CARDIOVERSION N/A 09/16/2018   Procedure: TRANSESOPHAGEAL ECHOCARDIOGRAM (TEE);  Surgeon: Lelon Perla, MD;  Location: [facility] ENDOSCOPY;  Service: Cardiovascular;  Laterality: N/A;  . TOTAL HIP ARTHROPLASTY  02/03/2011   Procedure: TOTAL HIP ARTHROPLASTY;  Surgeon: Garald Balding, MD;  Location: [place];  Service: Orthopedics;  Laterality: Left;  . TRIGGER FINGER RELEASE Left 03/29/2013   Procedure: LEFT MIDDLE/SMALL FINGER RELEASE A-1 PULLEY AND LEFT RING FINGER;  Surgeon: Wynonia Sours, MD;  Location: [place];  Service: Orthopedics;  Laterality: Left;  . TRIGGER FINGER RELEASE Right 03/06/2014   Procedure: RIGHT MIDDLE FINGER,RIGHT RINGER FINGER;  Surgeon: Daryll Brod, MD;  Location: [place];  Service: Orthopedics;  Laterality: Right;     reports that he quit smoking about 11 years ago. His smoking use included cigarettes. He has a 30.00 pack-year smoking history. He has never used smokeless tobacco. He reports that he does not drink alcohol and does not use drugs. Social History  No Known Allergies  Family History  Problem Relation Age of Onset  . Heart disease Mother   . Heart disease Father      Prior to Admission medications   Medication Sig Start Date End Date Taking? Authorizing Provider  aspirin EC 81 MG EC tablet Take 1 tablet (81 mg total) by mouth daily. Swallow whole. 08/11/19  Yes Little Ishikawa, MD  atorvastatin (LIPITOR) 40 MG tablet TAKE 1 TABLET EVERY DAY Patient taking differently: Take 40 mg by mouth at bedtime.  07/24/19  Yes  Jettie Booze, MD  famotidine (PEPCID) 20 MG tablet Take 1 tablet (20 mg total) by mouth at bedtime. 08/11/19  Yes Little Ishikawa, MD  ibuprofen (ADVIL) 200 MG tablet Take 200 mg by mouth daily as needed for headache.   Yes [provider]  latanoprost (XALATAN) 0.005 % ophthalmic solution Place 1 drop into both eyes at bedtime.   Yes [provider]  losartan (COZAAR) 25 MG tablet Take 1 tablet (25 mg total) by mouth daily. 08/11/19  Yes Little Ishikawa, MD  Multiple Vitamin (MULTIVITAMIN WITH MINERALS) TABS tablet Take 1 tablet by mouth daily. 08/11/19  Yes Little Ishikawa, MD  nitroGLYCERIN (NITROSTAT) 0.4 MG SL tablet Place 1 tablet (0.4 mg total) under the tongue every 5 (five) minutes x 3 doses as needed for chest pain. 11/22/18  Yes Jettie Booze, MD  metoprolol succinate (TOPROL-XL) 25 MG 24 hr tablet Take 1 tablet (25 mg total)  by mouth 2 (two) times daily with a meal. 08/11/19   Little Ishikawa, MD  OVER THE COUNTER MEDICATION Take 2-3 capsules by mouth See admin instructions. Omega XL - take 3 capsules by mouth every morning and 2 capsules at night    [provider]    Physical Exam: Vitals:   08/31/19 1626 08/31/19 1722 08/31/19 1908 08/31/19 2138  BP: (!) 157/130 (!) 164/138 (!) 172/128 (!) 141/106  Pulse: 93 (!) 105 (!) 104   Resp: 14 14  (!) 23  Temp: 97.6 F (36.4 C)     TempSrc: Oral     SpO2: 94% 99%    Weight:      Height:        Constitutional: NAD, calm, comfortable, elderly gentleman sitting upright in bed eating [place] sandwich Vitals:   08/31/19 1626 08/31/19 1722 08/31/19 1908 08/31/19 2138  BP: (!) 157/130 (!) 164/138 (!) 172/128 (!) 141/106  Pulse: 93 (!) 105 (!) 104   Resp: 14 14  (!) 23  Temp: 97.6 F (36.4 C)     TempSrc: Oral     SpO2: 94% 99%    Weight:      Height:       Eyes: PERRL, lids and conjunctivae normal ENMT: Mucous membranes are moist.  Neck: normal, supple,  Respiratory:  Crackles heard at the left lung base with no wheezing. Normal respiratory effort on 2 L of oxygen via nasal cannula. No accessory muscle use.  Cardiovascular: Irregularly irregular rate and rhythm, no murmurs / rubs / gallops.  +3 pitting edema of the ankles up to mid pretibial region of bilateral lower extremity. 2+ pedal pulses.   Abdomen: no tenderness, no masses palpated.Bowel sounds positive.  Musculoskeletal: no clubbing / cyanosis. No joint deformity upper and lower extremities. Good ROM, no contractures. Normal muscle tone.  Skin: no rashes, lesions, ulcers. No induration Neurologic: CN 2-12 grossly intact. Sensation intact,Strength 5/5 in all 4.  Psychiatric: Normal judgment and insight. Alert and oriented to self and family but could not get time and place correct.  Normal, pleasant mood.     Labs on Admission: I have personally reviewed following labs and imaging studies  CBC: Recent Labs  Lab 08/31/19 0809  WBC 6.9  HGB 13.7  HCT 42.5  MCV 93.4  PLT 161   Basic Metabolic Panel: Recent Labs  Lab 08/31/19 0809  NA 140  K 3.9  CL 103  CO2 24  GLUCOSE 122*  BUN 20  CREATININE 1.20  CALCIUM 9.2   GFR: Estimated Creatinine Clearance: 43.5 mL/min (by C-G formula based on SCr of 1.2 mg/dL). Liver Function Tests: Recent Labs  Lab 08/31/19 1833  AST 33  ALT 41  ALKPHOS 103  BILITOT 2.1*  PROT 7.6  ALBUMIN 3.5   No results for input(s): LIPASE, AMYLASE in the last 168 hours. No results for input(s): AMMONIA in the last 168 hours. Coagulation Profile: No results for input(s): INR, PROTIME in the last 168 hours. Cardiac Enzymes: No results for input(s): CKTOTAL, CKMB, CKMBINDEX, TROPONINI in the last 168 hours. BNP (last 3 results) No results for input(s): PROBNP in the last 8760 hours. HbA1C: No results for input(s): HGBA1C in the last 72 hours. CBG: No results for input(s): GLUCAP in the last 168 hours. Lipid Profile: No results for input(s): CHOL, HDL,  LDLCALC, TRIG, CHOLHDL, LDLDIRECT in the last 72 hours. Thyroid Function Tests: No results for input(s): TSH, T4TOTAL, FREET4, T3FREE, THYROIDAB in the last 72 hours.  Anemia Panel: No results for input(s): VITAMINB12, FOLATE, FERRITIN, TIBC, IRON, RETICCTPCT in the last 72 hours. Urine analysis:    Component Value Date/Time   COLORURINE AMBER (A) 08/31/2019 1322   APPEARANCEUR HAZY (A) 08/31/2019 1322   LABSPEC 1.025 08/31/2019 1322   PHURINE 5.0 08/31/2019 1322   GLUCOSEU NEGATIVE 08/31/2019 1322   HGBUR SMALL (A) 08/31/2019 1322   BILIRUBINUR NEGATIVE 08/31/2019 1322   KETONESUR NEGATIVE 08/31/2019 1322   PROTEINUR 100 (A) 08/31/2019 1322   UROBILINOGEN 0.2 01/27/2011 1233   NITRITE NEGATIVE 08/31/2019 1322   LEUKOCYTESUR NEGATIVE 08/31/2019 1322    Radiological Exams on Admission: DG Chest 1 View  Result Date: 08/31/2019 CLINICAL DATA:  Shortness of breath and chest discomfort for 2 weeks. EXAM: CHEST  1 VIEW COMPARISON:  Radiograph and CT 08/06/2019 FINDINGS: Loop recorder projects over the left chest wall. Cardiomegaly with slight progression. Unchanged mediastinal contours. Increase in hazy bilateral lung base opacities likely combination of pleural effusion and atelectasis/airspace disease. No evidence of pulmonary edema. No pneumothorax. No acute osseous abnormalities are seen. IMPRESSION: Increase in hazy bilateral lung base opacities likely combination of pleural effusions and atelectasis/airspace disease. Pneumonia is also considered. Cardiomegaly is slightly increased. Overall findings suggest fluid overload. Electronically Signed   By: Keith Rake M.D.   On: 08/31/2019 18:49      Assessment/Plan  Acute on chronic systolic CHF Patient recently hospitalized in the beginning of August with the same and had echocardiogram on 8/2 with EF down to 21%.  Unclear why he was not discharged on any diuretics which I suspect is the cause of his acute exacerbation. No repeat echo  at this time Strict intake and output Daily weights Daily IV 40 mg Lasix.  Suspect this will need to be increased pending his output. Need cardiology consult in the morning  Altered mental status Suspect this is more likely due to his hypoxia but given that he recently had an intracranial bleed about 2 weeks ago.  Will obtain a repeat CT head now.  Acute hypoxic respiratory failure secondary to CHF exacerbation Had hypoxia down to 87% now requiring 2 L [place] as tolerated as he is being diuresed  Elevated troponin  Due to demand ischemia and CHF  Lactic acidosis Due to poor perfusion from acute CHF and low EF  Paroxysmal atrial fibrillation Rate controlled Continue metoprolol Off of anticoagulation due to recent intracranial bleed  Hypertension with history of recent intracranial bleed Continue oral hypertensives and maintain a strict blood pressure with systolic goal below 161  CAD Currently asymptomatic.  Continue aspirin.  Hx of CVA continue aspirin. Plavix discontinued due to recent ICH.  DVT prophylaxis:.SCDs Code Status: Full Family Communication: Plan discussed with patient at bedside and with family member at bedside and over the phone  disposition Plan: Home with at least 2 midnight stays  Consults called:  Admission status: inpatient    Status is: Inpatient  Remains inpatient appropriate because:Inpatient level of care appropriate due to severity of illness   Dispo: The patient is from: Home              Anticipated d/c is to: Home              Anticipated d/c date is: > 3 days              Patient currently is not medically stable to d/c.         Orene Desanctis DO [facility]   If 7PM-7AM,  please contact night-coverage [email or web address]   08/31/2019, 10:44 PM

## 2019-09-01 ENCOUNTER — Inpatient Hospital Stay (HOSPITAL_COMMUNITY): Payer: Medicare HMO

## 2019-09-01 LAB — BASIC METABOLIC PANEL
Anion gap: 10 (ref 5–15)
BUN: 22 mg/dL (ref 8–23)
CO2: 28 mmol/L (ref 22–32)
Calcium: 9 mg/dL (ref 8.9–10.3)
Chloride: 102 mmol/L (ref 98–111)
Creatinine, Ser: 1.32 mg/dL — ABNORMAL HIGH (ref 0.61–1.24)
GFR calc Af Amer: 57 mL/min — ABNORMAL LOW (ref >60)
GFR calc non Af Amer: 49 mL/min — ABNORMAL LOW (ref >60)
Glucose, Bld: 107 mg/dL — ABNORMAL HIGH (ref 70–99)
Potassium: 3.3 mmol/L — ABNORMAL LOW (ref 3.5–5.1)
Sodium: 140 mmol/L (ref 135–145)

## 2019-09-01 LAB — LACTIC ACID, PLASMA
Lactic Acid, Venous: 4.7 mmol/L (ref 0.5–1.9)
Lactic Acid, Venous: 2.3 mmol/L (ref 0.5–1.9)

## 2019-09-01 MED ORDER — GADOBUTROL 1 MMOL/ML IV SOLN
7.5000 mL | Freq: Once | INTRAVENOUS | Status: AC | PRN
  Administered 2019-09-01: 7.5 mL via INTRAVENOUS

## 2019-09-01 MED ORDER — FUROSEMIDE 10 MG/ML IJ SOLN
40.0000 mg | Freq: Two times a day (BID) | INTRAMUSCULAR | Status: DC
  Administered 2019-09-01 – 2019-09-03 (×5): 40 mg via INTRAVENOUS
  Filled 2019-09-01 (×5): qty 4

## 2019-09-01 MED ORDER — POTASSIUM CHLORIDE CRYS ER 20 MEQ PO TBCR
40.0000 meq | EXTENDED_RELEASE_TABLET | Freq: Once | ORAL | Status: AC
  Administered 2019-09-01: 40 meq via ORAL
  Filled 2019-09-01: qty 2

## 2019-09-01 MED ORDER — METOPROLOL SUCCINATE ER 25 MG PO TB24
12.5000 mg | ORAL_TABLET | Freq: Two times a day (BID) | ORAL | Status: DC
  Administered 2019-09-01 – 2019-09-02 (×2): 12.5 mg via ORAL
  Filled 2019-09-01 (×2): qty 1

## 2019-09-01 NOTE — ED Notes (Signed)
Breakfast Ordered 

## 2019-09-01 NOTE — Progress Notes (Signed)
PROGRESS NOTE  Brian Brown ZOX:[id number] DOB: [birth year] DOA: 08/31/2019 PCP: Seward Carol, MD  HPI/Recap of past 24 hours: HPI: Brian Brown is a 86 y.o. male with medical history significant for CVA w/ R sided weakness, CAD s/p NSTEMI, cardiomyopathy, paroxysmal atrial fibrillation off of Eliquis due to [place], OSA not on CPAP, hypertension, hepatitis C who presents with concerns of chest pain and increasing shortness of breath.  Patient lives alone and has family members to come to check on him.  They were concerned because he has been having increasing shortness of breath and having some altered mental status.  He was recently hospitalized with acute on chronic systolic CHF and since returning home he says he has been feeling some discomfort to his chest and upper abdomen.  However denies any nausea or vomiting.  Patient was hospitalized from 8/1-8/6 for progressive fatigue and was found to be in acute on chronic systolic heart failure.  His EF had decreased from 45 to 50% down to 21%.  Hospital course was also complicated for acute mental status change and he was found to have a right lateral thalamic intracranial hemorrhage.  His Eliquis for atrial fibrillation and Plavix was discontinued which his family confirms he has not been taking since.  He continues to take aspirin per neurology.  He also was not sent home on any diuretics.   ED Course: He was afebrile, mildly tachycardic and hypertensive up to systolic of 811B over 147W.  Also later noted to have hypoxia with oxygen saturation down to 87% and was placed on 2 L.  BNP elevated to 1244.  Mildly elevated troponin up to 56 and then 51.  Also noted to have elevated lactate of 2.6 and up to 4.6.  Chest x-ray shows increase in hazy bilateral lung base opacity and cardiomegaly suggestive of fluid overload.   09/01/19: Seen and examined.  Denies any chest pain or shortness of breath.  Bilateral lower extremity edema noted on  exam.  Left JVD also noted.   Assessment/Plan: Principal Problem:   Acute on chronic systolic CHF (congestive heart failure) (HCC) Active Problems:   PAF (paroxysmal atrial fibrillation) (HCC)   Elevated troponin   Acute systolic heart failure (HCC)   AMS (altered mental status)   Acute respiratory failure with hypoxia (HCC)   Lactic acidosis   History of intracerebral hemorrhage without residual deficit  Acute on chronic systolic CHF Patient recently hospitalized in the beginning of August with the same and had echocardiogram on 08/07/19 with EF down to 21%.   Ongoing diuresing with IV Lasix 40 mg twice daily Strict I's and O's and daily weight  Resolved, acute metabolic encephalopathy Alert and oriented x4. Recently had an intracranial bleed about 2 weeks ago.  No acute findings on CT head  Acute hypoxic respiratory failure secondary to CHF exacerbation Had hypoxia down to 87% now requiring 2 L Wean as tolerated as he is being diuresed Home O2 evaluation prior to DC  Elevated troponin  Due to demand ischemia and CHF Peaked at 56 and trending down. Denies any anginal symptoms.  Lactic acidosis Due to poor perfusion from acute CHF and low EF Lactic acid levels downtrending, continue diuresing  Paroxysmal atrial fibrillation Rate controlled on metoprolol, continue Off of anticoagulation due to recent intracranial bleed  Hypertension with history of recent intracranial bleed Continue oral hypertensives and maintain a strict blood pressure with systolic goal below 295  CAD Currently asymptomatic.  Continue aspirin and Lipitor.  Hx of CVA continue aspirin and Lipitor. Plavix discontinued due to recent ICH.  DVT prophylaxis:.SCDs Code Status: Full Family Communication: Plan discussed with patient at bedside .  He understands and agrees to plan.  Status is: Inpatient   Dispo: The patient is from: Home.               Anticipated d/c is to: Home with home  health services.              Anticipated d/c date is: 09/03/2019.               Patient currently not stable for discharge due to ongoing diuresing in the setting of acute on chronic systolic CHF.        Objective: Vitals:   09/01/19 1115 09/01/19 1130 09/01/19 1145 09/01/19 1200  BP: 121/90 (!) 119/96 120/88 (!) 122/93  Pulse: 85 86 88 83  Resp: 18 19 (!) 22 17  Temp:      TempSrc:      SpO2: 100% 100% 98% 100%  Weight:      Height:        Intake/Output Summary (Last 24 hours) at 09/01/2019 1246 Last data filed at 09/01/2019 0142 Gross per 24 hour  Intake -  Output 1600 ml  Net -1600 ml   Filed Weights   08/31/19 0756  Weight: 77.1 kg    Exam:  . General: 86 y.o. year-old male well developed well nourished in no acute distress.  Alert and oriented x3. . Cardiovascular: Regular rate and rhythm with no rubs or gallops.  No thyromegaly or JVD noted.   Marland Kitchen Respiratory: Mild rales noted at bases.  Good inspiratory effort.   . Abdomen: Soft nontender nondistended with normal bowel sounds x4 quadrants. . Musculoskeletal: 2+ pitting edema in lower extremities bilaterally.  Marland Kitchen Psychiatry: Mood is appropriate for condition and setting   Data Reviewed: CBC: Recent Labs  Lab 08/31/19 0809  WBC 6.9  HGB 13.7  HCT 42.5  MCV 93.4  PLT 956   Basic Metabolic Panel: Recent Labs  Lab 08/31/19 0809 09/01/19 0405  NA 140 140  K 3.9 3.3*  CL 103 102  CO2 24 28  GLUCOSE 122* 107*  BUN 20 22  CREATININE 1.20 1.32*  CALCIUM 9.2 9.0   GFR: Estimated Creatinine Clearance: 39.6 mL/min (A) (by C-G formula based on SCr of 1.32 mg/dL (H)). Liver Function Tests: Recent Labs  Lab 08/31/19 1833  AST 33  ALT 41  ALKPHOS 103  BILITOT 2.1*  PROT 7.6  ALBUMIN 3.5   No results for input(s): LIPASE, AMYLASE in the last 168 hours. No results for input(s): AMMONIA in the last 168 hours. Coagulation Profile: No results for input(s): INR, PROTIME in the last 168 hours. Cardiac  Enzymes: No results for input(s): CKTOTAL, CKMB, CKMBINDEX, TROPONINI in the last 168 hours. BNP (last 3 results) No results for input(s): PROBNP in the last 8760 hours. HbA1C: No results for input(s): HGBA1C in the last 72 hours. CBG: No results for input(s): GLUCAP in the last 168 hours. Lipid Profile: No results for input(s): CHOL, HDL, LDLCALC, TRIG, CHOLHDL, LDLDIRECT in the last 72 hours. Thyroid Function Tests: No results for input(s): TSH, T4TOTAL, FREET4, T3FREE, THYROIDAB in the last 72 hours. Anemia Panel: No results for input(s): VITAMINB12, FOLATE, FERRITIN, TIBC, IRON, RETICCTPCT in the last 72 hours. Urine analysis:    Component Value Date/Time   COLORURINE AMBER (A) 08/31/2019 1322   APPEARANCEUR HAZY (A) 08/31/2019 1322  LABSPEC 1.025 08/31/2019 1322   PHURINE 5.0 08/31/2019 1322   GLUCOSEU NEGATIVE 08/31/2019 1322   HGBUR SMALL (A) 08/31/2019 1322   BILIRUBINUR NEGATIVE 08/31/2019 1322   KETONESUR NEGATIVE 08/31/2019 1322   PROTEINUR 100 (A) 08/31/2019 1322   UROBILINOGEN 0.2 01/27/2011 1233   NITRITE NEGATIVE 08/31/2019 1322   LEUKOCYTESUR NEGATIVE 08/31/2019 1322   Sepsis Labs: @LABRCNTIP (procalcitonin:4,lacticidven:4)  ) Recent Results (from the past 240 hour(s))  SARS Coronavirus 2 by RT PCR (hospital order, performed in [place] hospital lab) Nasopharyngeal Nasopharyngeal Swab     Status: None   Collection Time: 08/31/19  7:13 PM   Specimen: Nasopharyngeal Swab  Result Value Ref Range Status   SARS Coronavirus 2 NEGATIVE NEGATIVE Final    Comment: (NOTE) SARS-CoV-2 target nucleic acids are NOT DETECTED.  The SARS-CoV-2 RNA is generally detectable in upper and lower respiratory specimens during the acute phase of infection. The lowest concentration of SARS-CoV-2 viral copies this assay can detect is 250 copies / mL. A negative result does not preclude SARS-CoV-2 infection and should not be used as the sole basis for treatment or other patient  management decisions.  A negative result may occur with improper specimen collection / handling, submission of specimen other than nasopharyngeal swab, presence of viral mutation(s) within the areas targeted by this assay, and inadequate number of viral copies (<250 copies / mL). A negative result must be combined with clinical observations, patient history, and epidemiological information.  Fact Sheet for Patients:   StrictlyIdeas.no  Fact Sheet for Healthcare Providers: BankingDealers.co.za  This test is not yet approved or  cleared by the [place] FDA and has been authorized for detection and/or diagnosis of SARS-CoV-2 by FDA under an Emergency Use Authorization (EUA).  This EUA will remain in effect (meaning this test can be used) for the duration of the COVID-19 declaration under Section 564(b)(1) of the Act, 21 U.S.C. section 360bbb-3(b)(1), unless the authorization is terminated or revoked sooner.  Performed at [facility], [place] [address] [place], [place] [address]       Studies: DG Chest 1 View  Result Date: 08/31/2019 CLINICAL DATA:  Shortness of breath and chest discomfort for 2 weeks. EXAM: CHEST  1 VIEW COMPARISON:  Radiograph and CT 08/06/2019 FINDINGS: Loop recorder projects over the left chest wall. Cardiomegaly with slight progression. Unchanged mediastinal contours. Increase in hazy bilateral lung base opacities likely combination of pleural effusion and atelectasis/airspace disease. No evidence of pulmonary edema. No pneumothorax. No acute osseous abnormalities are seen. IMPRESSION: Increase in hazy bilateral lung base opacities likely combination of pleural effusions and atelectasis/airspace disease. Pneumonia is also considered. Cardiomegaly is slightly increased. Overall findings suggest fluid overload. Electronically Signed   By: Keith Rake M.D.   On: 08/31/2019 18:49   CT Head Wo Contrast  Result  Date: 08/31/2019 CLINICAL DATA:  Mental status change EXAM: CT HEAD WITHOUT CONTRAST TECHNIQUE: Contiguous axial images were obtained from the base of the skull through the vertex without intravenous contrast. COMPARISON:  MRI 08/08/2019, CT brain 08/07/2019 FINDINGS: Brain: No acute territorial infarction is visualized. Focal hyperdensity in the right thalamus measuring 8 mm, measures slightly smaller compared to the prior head CT and slightly more dense. There is mild surrounding hypodensity likely due to edema. Moderate atrophy. Moderate hypodensity in the white matter consistent with chronic small vessel ischemic change. Chronic infarct in the right occipital lobe. Chronic lacunar infarct in the left white matter. Stable ventricle size. Vascular: No hyperdense vessels.  Carotid vascular calcification. Skull: Normal. Negative for fracture or focal lesion. Sinuses/Orbits: No acute finding. Other: None IMPRESSION: 1. 8 mm focal hyperdensity in the right thalamus at the location of previous thalamic hemorrhage. This appears smaller and more dense compared to prior suggesting developing calcification within the lesion. Suspect that there may be an underlying lesion such as cavernoma without definitive evidence for acute interval bleeding since comparison head CT from earlier this month. Though acute bleed is felt unlikely, short interval 6 hour CT follow-up may be performed to ensure no change in size of the lesion; if lesion is stable, follow-up CT in 1 month may help to more definitively characterize the right thalamic lesion. 2. Atrophy and chronic small vessel ischemic changes of the white matter. Chronic right occipital infarct. Critical Value/emergent results were called by telephone at the time of interpretation on 08/31/2019 at 11:21 pm to provider [facility] , who verbally acknowledged these results. Electronically Signed   By: Donavan Foil M.D.   On: 08/31/2019 23:21   MR BRAIN W WO CONTRAST  Result  Date: 09/01/2019 CLINICAL DATA:  Right thalamic bleed. EXAM: MRI HEAD WITHOUT AND WITH CONTRAST TECHNIQUE: Multiplanar, multiecho pulse sequences of the brain and surrounding structures were obtained without and with intravenous contrast. CONTRAST:  7.75mL GADAVIST GADOBUTROL 1 MMOL/ML IV SOLN COMPARISON:  08/08/2019 FINDINGS: Brain: Contraction and resolved T1 hyperintensity at the right thalamic remote hemorrhage. Now, the appearance is primarily attributed to hemosiderin residua. No superimposed enhancement or edema, no underlying lesion on 02/24/2018 brain MRI. Other remote micro hemorrhages have been seen in the brainstem and deep white matter, consider hypertensive. Chronic small vessel ischemic gliosis in the cerebral white matter with chronic lacune is at the left corona radiata and right upper thalamus. Small remote right occipital and parietal cortex infarcts. No evidence of mass, hydrocephalus, or collection. Vascular: Normal flow voids and vascular enhancement Skull and upper cervical spine: Cervical spine degeneration with large ventral spur at C2-3. No evidence of bone lesion Sinuses/Orbits: Bilateral cataract resection.  No acute finding IMPRESSION: 1. Expected evolution of the now chronic right thalamic hemorrhage. No underlying lesion, the hemorrhage is attributed to hypertension. 2. Chronic small vessel ischemia and small remote right occipital parietal infarcts. Electronically Signed   By: Monte Fantasia M.D.   On: 09/01/2019 05:19    Scheduled Meds: . atorvastatin  40 mg Oral QHS  . famotidine  20 mg Oral QHS  . furosemide  40 mg Intravenous BID  . losartan  25 mg Oral Daily  . metoprolol succinate  12.5 mg Oral BID WC  . sodium chloride flush  3 mL Intravenous Q12H    Continuous Infusions: . sodium chloride       LOS: 1 day     Kayleen Memos, MD [facility] Pager [phone]  If 7PM-7AM, please contact night-coverage [email or web address] Password [facility] 09/01/2019, 12:46  PM

## 2019-09-02 ENCOUNTER — Encounter (HOSPITAL_COMMUNITY): Payer: Self-pay | Admitting: Family Medicine

## 2019-09-02 DIAGNOSIS — I2510 Atherosclerotic heart disease of native coronary artery without angina pectoris: Secondary | ICD-10-CM

## 2019-09-02 DIAGNOSIS — I5023 Acute on chronic systolic (congestive) heart failure: Secondary | ICD-10-CM

## 2019-09-02 DIAGNOSIS — J9601 Acute respiratory failure with hypoxia: Secondary | ICD-10-CM

## 2019-09-02 DIAGNOSIS — G9340 Encephalopathy, unspecified: Secondary | ICD-10-CM

## 2019-09-02 DIAGNOSIS — I4819 Other persistent atrial fibrillation: Secondary | ICD-10-CM

## 2019-09-02 DIAGNOSIS — I25118 Atherosclerotic heart disease of native coronary artery with other forms of angina pectoris: Secondary | ICD-10-CM

## 2019-09-02 DIAGNOSIS — Z8679 Personal history of other diseases of the circulatory system: Secondary | ICD-10-CM

## 2019-09-02 LAB — BASIC METABOLIC PANEL
Anion gap: 11 (ref 5–15)
BUN: 21 mg/dL (ref 8–23)
CO2: 25 mmol/L (ref 22–32)
Calcium: 8.6 mg/dL — ABNORMAL LOW (ref 8.9–10.3)
Chloride: 102 mmol/L (ref 98–111)
Creatinine, Ser: 1.04 mg/dL (ref 0.61–1.24)
GFR calc Af Amer: >60 mL/min (ref >60)
GFR calc non Af Amer: >60 mL/min (ref >60)
Glucose, Bld: 93 mg/dL (ref 70–99)
Potassium: 3.3 mmol/L — ABNORMAL LOW (ref 3.5–5.1)
Sodium: 138 mmol/L (ref 135–145)

## 2019-09-02 MED ORDER — MAGNESIUM SULFATE 2 GM/50ML IV SOLN
2.0000 g | Freq: Once | INTRAVENOUS | Status: AC
  Administered 2019-09-02: 2 g via INTRAVENOUS
  Filled 2019-09-02: qty 50

## 2019-09-02 MED ORDER — POTASSIUM CHLORIDE CRYS ER 20 MEQ PO TBCR
40.0000 meq | EXTENDED_RELEASE_TABLET | Freq: Two times a day (BID) | ORAL | Status: AC
  Administered 2019-09-02 – 2019-09-04 (×6): 40 meq via ORAL
  Filled 2019-09-02 (×6): qty 2

## 2019-09-02 MED ORDER — METOPROLOL SUCCINATE ER 25 MG PO TB24
25.0000 mg | ORAL_TABLET | Freq: Two times a day (BID) | ORAL | Status: DC
  Administered 2019-09-03 – 2019-09-05 (×5): 25 mg via ORAL
  Filled 2019-09-02 (×6): qty 1

## 2019-09-02 NOTE — Consult Note (Addendum)
Cardiology Consult    Patient ID: Brian Brown MRN: [id number], DOB/AGE: 06/21/1932   Admit date: 08/31/2019 Date of Consult: 09/02/2019  Primary Physician: Seward Carol, MD Primary Cardiologist: Larae Grooms, MD Requesting Provider: Aileen Fass, MD  Patient Profile    Brian Brown is a 86 y.o. 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 Nevada Crane.  Past Medical History   Past Medical History:  Diagnosis Date  . Arthritis    degenerative joint disease  . Bleeding stomach ulcer 25 yrs ago  . CAD (coronary artery disease)    a. 10/2018 NSTEMI/Cath: LM nl, LAD 60p/m, 30d, D1 90, LCX 50ost/p, OM2 90 thrombotic, RCA 30p, 20d-->med rx.  . CKD (chronic kidney disease), stage II   . Hepatitis 1981   does not know what kind;denies jaundice  . HFrEF (heart failure with reduced ejection fraction) ([place])    a. 08/2019 Echo: EF 21%.  Marland Kitchen Hypertension   . Ischemic cardiomyopathy    a. 02/2018 Echo: EF 60-65%; b. 10/2018 Echo: EF 45-50%; c. 08/2019 Echo: EF 21%, mild LVH. RVSP 41.48mmHg. Mod BAE. Mild MR. Triv AI.  . Mild dilation of ascending aorta ([place])   . PAF (paroxysmal atrial fibrillation) ([place])    a. 10/2018 noted on Linq-->eliquis initiated (CHA2DS2VASc = 7) however d/c'd in 08/2019 2/2 cerebral bleed.  . Prostate cancer [facility]) 2002   prostate, s/p surgery  . Sleep apnea 2005   does not wear a Cpap  . Stroke [facility])    a. 2003 mild residual right leg wkns; b. 01/1939 embolic stroke, later found to have PAF.  Marland Kitchen Wears dentures    full top  . Wears glasses     Past Surgical History:  Procedure Laterality Date  . BUBBLE STUDY  09/16/2018   Procedure: BUBBLE STUDY;  Surgeon: Lelon Perla, MD;  Location: [place];  Service: Cardiovascular;;  . CARDIAC CATHETERIZATION  10/19/2018  . CARPAL TUNNEL RELEASE Left 03/29/2013   Procedure: LEFT CARPAL TUNNEL RELEASE;  Surgeon: Wynonia Sours, MD;  Location: [place];  Service: Orthopedics;  Laterality: Left;  ANESTHESIA: IV REGIONAL FAB  . CARPAL TUNNEL RELEASE Right 03/06/2014   Procedure: RIGHT CARPAL TUNNEL RELEASE;  Surgeon: Daryll Brod, MD;  Location: [place];  Service: Orthopedics;  Laterality: Right;  . COLONOSCOPY    . HERNIA REPAIR  2009   rt inguinal hernia  . JOINT REPLACEMENT  12/2001   rt hip ;[place] CATH AND CORONARY ANGIOGRAPHY N/A 10/19/2018   Procedure: LEFT HEART CATH AND CORONARY ANGIOGRAPHY;  Surgeon: Wellington Hampshire, MD;  Location: [place] CV [facility];  Service: Cardiovascular;  Laterality: N/A;  . LOOP RECORDER INSERTION N/A 09/16/2018   Procedure: LOOP RECORDER INSERTION;  Surgeon: Constance Haw, MD;  Location: [place] [facility];  Service: Cardiovascular;  Laterality: N/A;  . PROSTATECTOMY  2002  . SHOULDER ARTHROSCOPY W/ ROTATOR CUFF REPAIR  2014   lt-dr whitfield  . TEE WITHOUT CARDIOVERSION N/A 09/16/2018   Procedure: TRANSESOPHAGEAL ECHOCARDIOGRAM (TEE);  Surgeon: Lelon Perla, MD;  Location: [place];  Service: Cardiovascular;  Laterality: N/A;  . TOTAL HIP ARTHROPLASTY  02/03/2011  Procedure: TOTAL HIP ARTHROPLASTY;  Surgeon: Garald Balding, MD;  Location: [place];  Service: Orthopedics;  Laterality: Left;  . TRIGGER FINGER RELEASE Left 03/29/2013   Procedure: LEFT MIDDLE/SMALL FINGER RELEASE A-1 PULLEY AND LEFT RING FINGER;  Surgeon: Wynonia Sours, MD;  Location: [place];  Service: Orthopedics;  Laterality: Left;  . TRIGGER FINGER RELEASE Right 03/06/2014   Procedure: RIGHT MIDDLE FINGER,RIGHT RINGER FINGER;  Surgeon: Daryll Brod, MD;  Location: [place];  Service: Orthopedics;  Laterality:  Right;     Allergies  No Known Allergies  History of Present Illness    86 year old male with above complex past medical history including stroke x2, coronary artery disease, paroxysmal atrial fibrillation, hypertension, hyperlipidemia, ischemic cardiomyopathy/HFrEF, CKD II, and right thalamic hemorrhage.  He previously suffered a stroke in 2003 and following this, has had mild residual right-sided weakness with chronic right lower extremity swelling.  He suffered a second stroke in February 2020, with complaints of left hemianopsia.  Imaging revealed a right PCA infarct consistent with an embolic pattern..  EF at that time was 60-65%.  He underwent [employer] Linq placement in September 2020 which subsequently showed paroxysmal atrial fibrillation.  In early October 2020, he was taken off of Plavix and placed on Eliquis.  He was admitted October 13 secondary to substernal chest pain that occurred after walking his dog.  He ruled in for non-STEMI and underwent diagnostic catheterization revealing a subtotal thrombotic occlusion of the distal OM 2 as well as a 90% stenosis in a small diagonal branch.  He otherwise had nonobstructive disease.  Medical therapy was recommended and he was placed back on Plavix and Eliquis (of note, he never started Eliquis prior to admission).  Echocardiogram showed an EF of 45-50%.  He was subsequently discharged and was last seen in cardiology clinic in April of this year, at which time he was stable.  Unfortunately, he was admitted in early August with progressive fatigue beginning about a week after a fall.    During admission, he was noted be volume overloaded.  Echocardiogram was performed and showed new severe LV dysfunction with EF of 21%.  High-sensitivity troponin was mildly elevated with a flat trend.  He was diuresed but subsequently developed mental status changes concerning for stroke.  Head CT was performed showing a right lateral thalamic intracranial  hemorrhage.  He was seen by neurology and Plavix and Eliquis were discontinued.  He was cleared to continue on aspirin therapy with plan for noncontrast head CT in 2 to 6 weeks.  From a heart failure standpoint, he diuresed approximately 3 L with reduction in weight to a nadir of 69.6 kg on August 4.  Lasix was held in the setting of mild acute kidney injury with a peak creatinine of 1.42 on August 4 and discharge weight on August 6 was listed at 74.6 kg.  Previous home dose of Lasix was not continued at discharge.  Following discharge, he returned home to where he lives by himself with his 75 pound bulldog.  He says for about a week he felt reasonably well but since then, he has been noticing increasing dyspnea on exertion, fatigue, and also exertional chest discomfort that seems to occur every day when he is coming back from walking his dog.  He eventually became so weak that he did not feel as though he could walk his dog any longer and he asked a nephew to come stay with him on this past Wednesday,  August 25.  Overnight into Thursday, he awoke with recurrent chest discomfort.  He awoke his nephew who brought him to the [facility] ED.  Here, ECG notable for afib @ 91 bpm and w/o acute ST/T changes.  BNP 1344, h/h, renal fxn, lytes stable.  CXR w/ CHF.  MRI brainw/ expected evolution of now chronic right thalamic hemorrhage with chronic small vessel ischemia and small remote right occipital parietal infarcts.  Patient was admitted and placed on IV Lasix at 40 mg IV twice daily.  He has had good response and is -3.76 L since admission with a reduction in weight from 77.1 kg on admission to 74.5 kg today.  He has noticed improvement in breathing but continues to have lower extremity swelling.  He remains in persistent rate controlled atrial fibrillation on telemetry.  Inpatient Medications    . atorvastatin  40 mg Oral QHS  . famotidine  20 mg Oral QHS  . furosemide  40 mg Intravenous BID  . losartan  25 mg Oral  Daily  . metoprolol succinate  12.5 mg Oral BID WC  . potassium chloride  40 mEq Oral BID WC  . sodium chloride flush  3 mL Intravenous Q12H    Family History    Family History  Problem Relation Age of Onset  . Heart disease Mother   . Heart disease Father    He indicated that his mother is deceased. He indicated that his father is deceased. He indicated that his maternal grandmother is deceased. He indicated that his maternal grandfather is deceased. He indicated that his paternal grandmother is deceased. He indicated that his paternal grandfather is deceased.   Social History    Social History   Socioeconomic History  . Marital status: Widowed    Spouse name: Not on file  . Number of children: Not on file  . Years of education: Not on file  . Highest education level: Not on file  Occupational History  . Not on file  Tobacco Use  . Smoking status: Former Smoker    Packs/day: 1.00    Years: 30.00    Pack years: 30.00    Types: Cigarettes    Quit date: 01/27/2008    Years since quitting: 11.6  . Smokeless tobacco: Never Used  Vaping Use  . Vaping Use: Never used  Substance and Sexual Activity  . Alcohol use: No  . Drug use: No  . Sexual activity: Not on file  Other Topics Concern  . Not on file  Social History Narrative   Lives in [place] by himself, with his bulldog.  He walks his dog daily.  3 daughters in [place].   Social Determinants of Health   Financial Resource Strain:   . Difficulty of Paying Living Expenses: Not on file  Food Insecurity:   . Worried About Charity fundraiser in the Last Year: Not on file  . Ran Out of Food in the Last Year: Not on file  Transportation Needs:   . Lack of Transportation (Medical): Not on file  . Lack of Transportation (Non-Medical): Not on file  Physical Activity:   . Days of Exercise per Week: Not on file  . Minutes of Exercise per Session: Not on file  Stress:   . Feeling of Stress : Not on file  Social  Connections:   . Frequency of Communication with Friends and Family: Not on file  . Frequency of Social Gatherings with Friends and Family: Not on file  . Attends  Religious Services: Not on file  . Active Member of Clubs or Organizations: Not on file  . Attends Archivist Meetings: Not on file  . Marital Status: Not on file  Intimate Partner Violence:   . Fear of Current or Ex-Partner: Not on file  . Emotionally Abused: Not on file  . Physically Abused: Not on file  . Sexually Abused: Not on file     Review of Systems    General:  No chills, fever, night sweats or weight changes.  Cardiovascular:  +++ daily exertional chest discomfort when walking his dog.  +++ dyspnea on exertion - worse over the past 2 wks, +++ lower ext edema, no orthopnea, +++ palpitations, no paroxysmal nocturnal dyspnea. Dermatological: No rash, lesions/masses Respiratory: No cough, +++ dyspnea Urologic: No hematuria, dysuria Abdominal:   No nausea, vomiting, diarrhea, bright red blood per rectum, melena, or hematemesis Neurologic:  No visual changes, +++ right leg wkns, changes in mental status. All other systems reviewed and are otherwise negative except as noted above.  Physical Exam    Blood pressure 106/76, pulse 72, temperature 98.2 F (36.8 C), temperature source Oral, resp. rate 18, height 5\' 8"  (1.727 m), weight 74.5 kg, SpO2 100 %.  General: Pleasant, NAD Psych: Normal affect. Neuro: Alert and oriented X 3. Moves all extremities spontaneously. HEENT: Normal  Neck: Supple without bruits.  Mod elevated JVP. Lungs:  Resp regular and unlabored, bibasilar crackles. Heart: IR, IR, no s3, s4, or murmurs. Abdomen: Soft, non-tender, non-distended, BS + x 4.  Extremities: No clubbing, cyanosis. 2+ RLE, 1+ LLE edema to mid-calf. DP/PT/Radials 2+ and equal bilaterally.  Labs    Cardiac Enzymes Recent Labs  Lab 08/06/19 1654 08/06/19 1854 08/31/19 1833 08/31/19 2138  TROPONINIHS 35* 35*  56* 51*      Lab Results  Component Value Date   WBC 6.9 08/31/2019   HGB 13.7 08/31/2019   HCT 42.5 08/31/2019   MCV 93.4 08/31/2019   PLT 187 08/31/2019    Recent Labs  Lab 08/31/19 1833 09/01/19 0405 09/02/19 0245  NA  --    < > 138  K  --    < > 3.3*  CL  --    < > 102  CO2  --    < > 25  BUN  --    < > 21  CREATININE  --    < > 1.04  CALCIUM  --    < > 8.6*  PROT 7.6  --   --   BILITOT 2.1*  --   --   ALKPHOS 103  --   --   ALT 41  --   --   AST 33  --   --   GLUCOSE  --    < > 93   < > = values in this interval not displayed.   Lab Results  Component Value Date   CHOL 120 08/09/2019   HDL 41 08/09/2019   LDLCALC 70 08/09/2019   TRIG 44 08/09/2019    Radiology Studies    DG Chest 1 View  Result Date: 08/31/2019 CLINICAL DATA:  Shortness of breath and chest discomfort for 2 weeks. EXAM: CHEST  1 VIEW COMPARISON:  Radiograph and CT 08/06/2019 FINDINGS: Loop recorder projects over the left chest wall. Cardiomegaly with slight progression. Unchanged mediastinal contours. Increase in hazy bilateral lung base opacities likely combination of pleural effusion and atelectasis/airspace disease. No evidence of pulmonary edema. No pneumothorax. No acute osseous abnormalities  are seen. IMPRESSION: Increase in hazy bilateral lung base opacities likely combination of pleural effusions and atelectasis/airspace disease. Pneumonia is also considered. Cardiomegaly is slightly increased. Overall findings suggest fluid overload. Electronically Signed   By: Keith Rake M.D.   On: 08/31/2019 18:49   CT Head Wo Contrast  Result Date: 08/31/2019 CLINICAL DATA:  Mental status change EXAM: CT HEAD WITHOUT CONTRAST TECHNIQUE: Contiguous axial images were obtained from the base of the skull through the vertex without intravenous contrast. COMPARISON:  MRI 08/08/2019, CT brain 08/07/2019 FINDINGS: Brain: No acute territorial infarction is visualized. Focal hyperdensity in the right  thalamus measuring 8 mm, measures slightly smaller compared to the prior head CT and slightly more dense. There is mild surrounding hypodensity likely due to edema. Moderate atrophy. Moderate hypodensity in the white matter consistent with chronic small vessel ischemic change. Chronic infarct in the right occipital lobe. Chronic lacunar infarct in the left white matter. Stable ventricle size. Vascular: No hyperdense vessels.  Carotid vascular calcification. Skull: Normal. Negative for fracture or focal lesion. Sinuses/Orbits: No acute finding. Other: None IMPRESSION: 1. 8 mm focal hyperdensity in the right thalamus at the location of previous thalamic hemorrhage. This appears smaller and more dense compared to prior suggesting developing calcification within the lesion. Suspect that there may be an underlying lesion such as cavernoma without definitive evidence for acute interval bleeding since comparison head CT from earlier this month. Though acute bleed is felt unlikely, short interval 6 hour CT follow-up may be performed to ensure no change in size of the lesion; if lesion is stable, follow-up CT in 1 month may help to more definitively characterize the right thalamic lesion. 2. Atrophy and chronic small vessel ischemic changes of the white matter. Chronic right occipital infarct. Critical Value/emergent results were called by telephone at the time of interpretation on 08/31/2019 at 11:21 pm to provider [facility] , who verbally acknowledged these results. Electronically Signed   By: Donavan Foil M.D.   On: 08/31/2019 23:21   HEAD WO CONTRAST  Result Date: 08/07/2019 CLINICAL DATA:  Mental status change, confusion EXAM: CT HEAD WITHOUT CONTRAST TECHNIQUE: Contiguous axial images were obtained from the base of the skull through the vertex without intravenous contrast. COMPARISON:  CT head 02/13/2018 FINDINGS: Brain: 10 mm hyperdensity right thalamus is new since the prior study most consistent with acute  hemorrhage. No significant mass-effect or surrounding edema. Ventricle size normal. Patchy white matter hypodensity bilaterally is chronic and unchanged. Chronic infarct right occipital lobe was not present previously. Negative for acute infarct. Vascular: Negative for hyperdense vessel Skull: Negative Sinuses/Orbits: Negative Other: None IMPRESSION: 10 mm hyperdensity right thalamus compatible with acute hemorrhage. Atrophy and chronic ischemic change. Chronic infarct right occipital lobe. These results were called by telephone at the time of interpretation on 08/07/2019 at 6:45 pm to provider [facility] , who verbally acknowledged these results. Electronically Signed   By: Franchot Gallo M.D.   On: 08/07/2019 18:43   MR BRAIN W WO CONTRAST  Result Date: 09/01/2019 CLINICAL DATA:  Right thalamic bleed. EXAM: MRI HEAD WITHOUT AND WITH CONTRAST TECHNIQUE: Multiplanar, multiecho pulse sequences of the brain and surrounding structures were obtained without and with intravenous contrast. CONTRAST:  7.18mL GADAVIST GADOBUTROL 1 MMOL/ML IV SOLN COMPARISON:  08/08/2019 FINDINGS: Brain: Contraction and resolved T1 hyperintensity at the right thalamic remote hemorrhage. Now, the appearance is primarily attributed to hemosiderin residua. No superimposed enhancement or edema, no underlying lesion on 02/24/2018 brain MRI. Other remote micro  hemorrhages have been seen in the brainstem and deep white matter, consider hypertensive. Chronic small vessel ischemic gliosis in the cerebral white matter with chronic lacune is at the left corona radiata and right upper thalamus. Small remote right occipital and parietal cortex infarcts. No evidence of mass, hydrocephalus, or collection. Vascular: Normal flow voids and vascular enhancement Skull and upper cervical spine: Cervical spine degeneration with large ventral spur at C2-3. No evidence of bone lesion Sinuses/Orbits: Bilateral cataract resection.  No acute finding IMPRESSION: 1.  Expected evolution of the now chronic right thalamic hemorrhage. No underlying lesion, the hemorrhage is attributed to hypertension. 2. Chronic small vessel ischemia and small remote right occipital parietal infarcts. Electronically Signed   By: Monte Fantasia M.D.   On: 09/01/2019 05:19   ECG & Cardiac Imaging    Afib, 91, septal infarct, no acute ST/T changes - personally reviewed.  Assessment & Plan    1.  Acute on chronic heart failure with reduced ejection fraction: Patient admitted in early August with fatigue, heart failure, and subsequent development of altered mental status with finding of right thalamic hemorrhage.  During that hospitalization, echocardiogram was performed and showed new, severe LV dysfunction with an EF of 21%, down from 45-50% from October 2020.  He was diuresed down to a weight of 69.6 kg but at discharge weight was listed at 74.6 kg on August 6.  Creatinine did bump to a peak of 1.42 prompting holding of Lasix.  He was not placed on Lasix at discharge (had been on Lasix on admission).  Patient says about a week after discharge, he began noticing worsening dyspnea on exertion, fatigue, and increasing lower extremity swelling.  He has not been weighing himself at home and meals are supplied by meals on wheels.  He returned to the hospital on August 26 in the setting of chest discomfort that occurred at rest.  He was found to be volume overloaded on examination and chest x-ray with a BNP of 1344.2.  He has responded well to IV diuresis and thus far, is -3.76 L and his weight is down from 77.1 kg on admission to 74.5 kg today.  He remains volume overloaded with bibasilar crackles, JVD, and lower extremity edema.  Renal function is stable.  Continue current dose of Lasix-40 mg IV twice daily.  Continue guideline directed medical therapy for heart failure including metoprolol succinate and losartan therapy.  His blood pressure is soft at 106/76, preventing [place] from switching him from  losartan to [facility], or adding an MRA.  Etiology of worsened LV dysfunction unclear though with the exception of diagonal and obtuse marginal disease, his cath in October 2020 was notable for predominantly nonobstructive disease.  2.  Coronary artery disease/demand ischemia: Status post non-STEMI in October 2020 with severe diagonal and obtuse marginal (thrombotic) disease and otherwise nonobstructive disease.  He has been experiencing daily exertional chest discomfort when walking his dog and experienced discomfort at rest on the night prior to admission.  Troponin is minimally elevated with a relatively flat trend of 56  51 (was 35  35 on 8/1).  He has not had any chest discomfort since admission.  Continue beta-blocker and statin therapy.  Plan to resume aspirin if okay with neurology.  He is not a candidate for invasive evaluation since he cannot be anticoagulated.  No role for noninvasive evaluation at this time.  3.  Paroxysmal atrial fibrillation: Twelve-lead ECGs dating back to April of this year show atrial fibrillation.  Previous  linq interrogation showed paroxysmal atrial fibrillation between October of last year and April of this year.  Question role of paroxysmal atrial fibrillation and tachycardia in exertional symptoms.  We will interrogate his monitor and consider antiarrhythmic therapy if he has remained paroxysmal and not persistent.  If persistent, cannot cardiovert secondary to absence of oral anticoagulation in the setting of right thalamic hemorrhage earlier this month.  Continue beta-blocker therapy.  He is well rate controlled at rest.  4.  Right thalamic hemorrhage: Patient fell about a week before his admission earlier this month and then had altered mental status during hospitalization prompting imaging which showed a right thalamic hemorrhage.  Aspirin and Eliquis were discontinued at that time.  Mental status has been stable.  MRI this admission notable for expected evolution of  the now chronic right thalamic hemorrhage.  Defer decisions related to oral anticoagulation/antiplatelet therapy to neurology.  5.  Essential hypertension: Blood pressure soft on beta-blocker and ARB therapy.  6.  Hyperlipidemia: LDL of 70 earlier this month.  7.  Stage II chronic kidney disease: Stable.  Follow with diuresis.  8.  Hypokalemia: Potassium down with diuresis.  3.3 this morning.  Supplementation ordered-40 mg twice daily.  9.  Lactic acidosis: Felt to be secondary to low output.  Improved with diuresis.  Signed, Murray Hodgkins, NP 09/02/2019, 4:05 PM  For questions or updates, please contact   Please consult [email or web address] for contact info under Cardiology/STEMI.    I have seen and examined the patient along with Murray Hodgkins, NP.  I have reviewed the chart, notes and new data.  I agree with PA/NP's note.  Key new complaints: he is breathing comfortably now, unaware of palpitations, no angina Key examination changes: 1+ residual R ankle edema, clear lungs, irregular rhythm, rate controlled Key new findings / data: loop recorder interrogation shows essentially uninterrupted AFib since late May, but with good overall rate control (other than maybe last 1-2 weeks, mild RVR). Echos from October and May compared side by side and there is indeed a striking worsening in LV systolic function, with a pattern of global hypokinesis.  Media Information   Document Information  Photos  Loop recorder download  09/02/2019 16:38  Attached To:  [name]  Source Information  Danaysia Rader, MD     PLAN: Clearly reduced LVEF and increased AFib burden are related, but direction of cause and effect is unclear. Would not expect worsening of cardiomyopathy in the absence of sustained RVR for weeks or months, but worsening LVEF could well cause more AFib. He would benefit hemodynamically from return to NSR, but this cannot be safely accomplished without  anticoagulation. It appears that the impression was that the hemorrhagic stroke led to his fall in early August (rather than a traumatic bleed). As such, it is not clear whether it will ever be safe to resume anticoagulation. Need Neurology input regarding the safety of oral anticoagulation. CHF guideline directed medical management has been implemented correctly, but the doses of ARB and beta blocker are limited by his low BP. Will attempt a slight increase in metoprolol dose.  Sanda Klein, MD, [employer] [phone] 09/02/2019, 4:36 PM

## 2019-09-02 NOTE — Plan of Care (Signed)

## 2019-09-02 NOTE — Consult Note (Signed)
Neurology Consultation  Reason for Consult: Resumption of anticoagulation in the setting of recent [place] Referring Physician: Dr. Irene Pap, [facility], Dr. Sanda Klein, cardiology  CC: Confusion  History is obtained from: Chart review, patient  HPI: Brian Brown is a 86 y.o. male history of recent hypertensive ICH of the right thalamus likely due to coagulopathy on Eliquis and antiplatelet at the time, prior strokes with residual right-sided weakness, paroxysmal atrial fibrillation, hypertension, coronary artery disease, CHF, hepatitis C, presented to the hospital for evaluation of chest pain, increasing shortness of breath and some confusion. He was seen and evaluated by the medicine hospitalist team and admitted for work-up of possible CHF exacerbation.  Cardiology was consulted, and recommended resuming anticoagulation for stroke prevention given his paroxysmal atrial fibrillation. His ICH was in early August when he was evaluated by the stroke team.  He was discharged home-with discontinued antiplatelet and anticoagulation, but at discharge started on aspirin in the interim, awaiting resolution of the ICH, recommendations of repeat CT scan in 2 to 4 weeks and then resume Eliquis. Neurology team reconsulted for opinion on timing of resumption of anticoagulation. Patient does not offer any real complaints at this time. Does not appear to be very confused today-see detailed exam below.   ROS: Performed and negative except as noted in HPI  Past Medical History:  Diagnosis Date  . Arthritis    degenerative joint disease  . Bleeding stomach ulcer 25 yrs ago  . CAD (coronary artery disease)    a. 10/2018 NSTEMI/Cath: LM nl, LAD 60p/m, 30d, D1 90, LCX 50ost/p, OM2 90 thrombotic, RCA 30p, 20d-->med rx.  . CKD (chronic kidney disease), stage II   . Hepatitis 1981   does not know what kind;denies jaundice  . HFrEF (heart failure with reduced ejection fraction) ([place])    a. 08/2019  Echo: EF 21%.  Marland Kitchen Hypertension   . Ischemic cardiomyopathy    a. 02/2018 Echo: EF 60-65%; b. 10/2018 Echo: EF 45-50%; c. 08/2019 Echo: EF 21%, mild LVH. RVSP 41.24mmHg. Mod BAE. Mild MR. Triv AI.  . Mild dilation of ascending aorta ([place])   . PAF (paroxysmal atrial fibrillation) ([place])    a. 10/2018 noted on Linq-->eliquis initiated (CHA2DS2VASc = 7) however d/c'd in 08/2019 2/2 cerebral bleed.  . Prostate cancer [facility]) 2002   prostate, s/p surgery  . Sleep apnea 2005   does not wear a Cpap  . Stroke [facility])    a. 2003 mild residual right leg wkns; b. 04/345 embolic stroke, later found to have PAF.  Marland Kitchen Wears dentures    full top  . Wears glasses     Family History  Problem Relation Age of Onset  . Heart disease Mother   . Heart disease Father     Social History:   reports that he quit smoking about 11 years ago. His smoking use included cigarettes. He has a 30.00 pack-year smoking history. He has never used smokeless tobacco. He reports that he does not drink alcohol and does not use drugs.  Medications  Current Facility-Administered Medications:  .  0.9 %  sodium chloride infusion, 250 mL, Intravenous, PRN, Tu, Ching T, DO .  acetaminophen (TYLENOL) tablet 650 mg, 650 mg, Oral, Q4H PRN, Tu, Ching T, DO .  atorvastatin (LIPITOR) tablet 40 mg, 40 mg, Oral, QHS, Tu, Ching T, DO, 40 mg at 09/02/19 2005 .  famotidine (PEPCID) tablet 20 mg, 20 mg, Oral, QHS, Tu, Ching T, DO, 20 mg at 09/02/19 2005 .  furosemide (LASIX) injection 40 mg, 40 mg, Intravenous, BID, Hall, Carole N, DO, 40 mg at 09/02/19 1719 .  losartan (COZAAR) tablet 25 mg, 25 mg, Oral, Daily, Irene Pap N, DO, 25 mg at 09/02/19 7106 .  [START ON 09/03/2019] metoprolol succinate (TOPROL-XL) 24 hr tablet 25 mg, 25 mg, Oral, BID WC, Croitoru, Mihai, MD .  ondansetron (ZOFRAN) injection 4 mg, 4 mg, Intravenous, Q6H PRN, Tu, Ching T, DO .  potassium chloride SA (KLOR-CON) CR tablet 40 mEq, 40 mEq, Oral, BID WC, Hall, Carole N, DO, 40  mEq at 09/02/19 1719 .  sodium chloride flush (NS) 0.9 % injection 3 mL, 3 mL, Intravenous, Q12H, Tu, Ching T, DO, 3 mL at 09/02/19 2005 .  sodium chloride flush (NS) 0.9 % injection 3 mL, 3 mL, Intravenous, PRN, Tu, Ching T, DO   Exam: Current vital signs: BP 104/78 (BP Location: Left Arm)   Pulse 89   Temp 98.5 F (36.9 C) (Oral)   Resp 15   Ht 5\' 8"  (1.727 m)   Wt 74.5 kg   SpO2 98%   BMI 24.97 kg/m  Vital signs in last 24 hours: Temp:  [97.2 F (36.2 C)-99.1 F (37.3 C)] 98.5 F (36.9 C) (08/28 1956) Pulse Rate:  [69-94] 89 (08/28 1956) Resp:  [15-20] 15 (08/28 1956) BP: (104-135)/(76-103) 104/78 (08/28 1956) SpO2:  [98 %-100 %] 98 % (08/28 1956) Weight:  [74.5 kg] 74.5 kg (08/28 0012) General: Awake alert in no apparent distress, sitting comfortably in bed watching TV HEENT: Normocephalic atraumatic Lungs: Bibasilar crackles CVS: Irregularly irregular, no murmurs Extremities: 1+ pitting edema lower extremities Abdomen nondistended nontender Neurological exam Awake alert oriented to self, month, place.  Was able to tell me who the current president is correctly. Speech is mildly dysarthric No evidence of gross aphasia although has mildly reduced attention concentration Cranial nerves: Pupils equal round react light, extraocular movements intact, left hemianopsia, face symmetric, tongue midline. Motor exam: No drift in any of the 4 extremities-all 4 extremities antigravity.  Normal tone. Sensory exam: Intact to touch without extinction Coordination: No obvious dysmetria Gait testing deferred NIH stroke scale-2   Labs I have reviewed labs in epic and the results pertinent to this consultation are:  CBC    Component Value Date/Time   WBC 6.9 08/31/2019 0809   RBC 4.55 08/31/2019 0809   HGB 13.7 08/31/2019 0809   HGB 13.0 11/01/2018 1238   HCT 42.5 08/31/2019 0809   HCT 38.7 11/01/2018 1238   PLT 187 08/31/2019 0809   PLT 259 11/01/2018 1238   MCV 93.4  08/31/2019 0809   MCV 87 11/01/2018 1238   MCH 30.1 08/31/2019 0809   MCHC 32.2 08/31/2019 0809   RDW 14.5 08/31/2019 0809   RDW 12.8 11/01/2018 1238   LYMPHSABS 1.4 02/13/2018 0815   MONOABS 0.5 02/13/2018 0815   EOSABS 0.2 02/13/2018 0815   BASOSABS 0.1 02/13/2018 0815    CMP     Component Value Date/Time   NA 138 09/02/2019 0245   NA 140 11/01/2018 1238   K 3.3 (L) 09/02/2019 0245   CL 102 09/02/2019 0245   CO2 25 09/02/2019 0245   GLUCOSE 93 09/02/2019 0245   BUN 21 09/02/2019 0245   BUN 17 11/01/2018 1238   CREATININE 1.04 09/02/2019 0245   CALCIUM 8.6 (L) 09/02/2019 0245   PROT 7.6 08/31/2019 1833   ALBUMIN 3.5 08/31/2019 1833   AST 33 08/31/2019 1833   ALT 41 08/31/2019 1833  ALKPHOS 103 08/31/2019 1833   BILITOT 2.1 (H) 08/31/2019 1833   GFRNONAA >60 09/02/2019 0245   GFRAA >60 09/02/2019 0245    Lipid Panel     Component Value Date/Time   CHOL 120 08/09/2019 0417   TRIG 44 08/09/2019 0417   HDL 41 08/09/2019 0417   CHOLHDL 2.9 08/09/2019 0417   VLDL 9 08/09/2019 0417   LDLCALC 70 08/09/2019 0417     Imaging I have reviewed the images obtained: Most recent MRI of the brain obtained yesterday showed expected evolution of the now chronic right thalamic hemorrhage, no underlying lesion. Hemorrhage likely secondary to hypertension.  Few other microhemorrhages seen in white matter and brainstem-likely sequelae of chronic hypertension.  Overall imaging picture not concerning for cerebral amyloid angiopathy.  Assessment: 86 year old man with above past medical history, who was on Eliquis and Plavix, had a right thalamic ICH in the early part of August 2021, was seen by stroke neurology, recommended to be only on aspirin for 2 to 4 weeks and then resume anticoagulation, comes back for evaluation of shortness of breath that is worsening as well as some confusion.  Being treated for CHF exacerbation.  Has extensive cardiac history including coronary artery disease,  paroxysmal atrial fibrillation and CHF. At this time, his bleed has been stable and on the MRI showing resolution Given his high CHA2DS2-VASc score and other cardiac risk factors, he should be resumed on his anticoagulation and antiplatelet with the understanding that he will always be at high risk for bleed but at this point the antiplatelet and anticoagulant have to be used for management of his coronary artery disease as well as stroke prevention from his proximal A. fib.   Impression: Recent ICH-based on location, likely hypertensive-less likely to be traumatic. CHF exacerbation Underlying paroxysmal atrial fibrillation for which she was previously on anticoagulation which was held due to the recent [place].   Recommendations: -From a neurological standpoint, as said above, he can be started on anticoagulation for stroke prevention as he is nearly 4 weeks from his bleed with stable/resolved right ICH on MRI. -Addition of antiplatelet is primarily for cardiac reasons and I will defer to cardiology as to how important it is to use antiplatelet in his case, with his extensive history I would imagine that it is highly indicated. -We have to be aware and the family has to be made aware that having antiplatelet and anticoagulant on board will always have increased risk of bleeds. -Close neuro monitoring. -Strict blood pressure control with goal blood pressure at discharge less than 140/90. -Can repeat CT head 1 to 2 days after resuming anticoagulation to ensure that there is no increase or reappearance of the right thalamic bleed. -Maintain fall precautions, in the hospital as well as at home. -Please call with questions as needed.  Inpatient neurology will sign off.  -- Amie Portland, MD [facility] Pager: [phone] If 7pm to 7am, please call on call as listed on AMION.

## 2019-09-02 NOTE — Progress Notes (Addendum)
PROGRESS NOTE  Brian Brown SWF:[id number] DOB: [birth year] DOA: 08/31/2019 PCP: Seward Carol, MD  HPI/Recap of past 24 hours: HPI: Brian Brown is a 86 y.o. male with medical history significant for CVA w/ R sided weakness, CAD s/p NSTEMI, cardiomyopathy, paroxysmal atrial fibrillation off of Eliquis due to [place], OSA not on CPAP, hypertension, hepatitis C who presents with concerns of chest pain and increasing shortness of breath.  Patient lives alone and has family members to come to check on him.  They were concerned because he has been having increasing shortness of breath and having some altered mental status.  He was recently hospitalized with acute on chronic systolic CHF and since returning home he says he has been feeling some discomfort to his chest and upper abdomen.  However denies any nausea or vomiting.  Patient was hospitalized from 8/1-8/6 for progressive fatigue and was found to be in acute on chronic systolic heart failure.  His EF had decreased from 45 to 50% down to 21%.  Hospital course was also complicated for acute mental status change and he was found to have a right lateral thalamic intracranial hemorrhage.  His Eliquis for atrial fibrillation and Plavix was discontinued which his family confirms he has not been taking since.  He continues to take aspirin per neurology.  He also was not sent home on any diuretics.   ED Course: He was afebrile, mildly tachycardic and hypertensive up to systolic of 220U over 542H.  Also later noted to have hypoxia with oxygen saturation down to 87% and was placed on 2 L.  BNP elevated to 1244.  Mildly elevated troponin up to 56 and then 51.  Also noted to have elevated lactate of 2.6 and up to 4.6.  Chest x-ray shows increase in hazy bilateral lung base opacity and cardiomegaly suggestive of fluid overload.   09/02/19: Seen and examined with his daughter at bedside.  Denies any chest pain or dyspnea at rest.  He is on 3 L  nasal cannula, not on oxygen supplementation at baseline.  1+ pitting edema in lower extremities bilaterally.  Ongoing diuresing, net I&O -3.7 L.    Assessment/Plan: Principal Problem:   Acute on chronic systolic CHF (congestive heart failure) (HCC) Active Problems:   PAF (paroxysmal atrial fibrillation) (HCC)   Elevated troponin   Acute systolic heart failure (HCC)   AMS (altered mental status)   Acute respiratory failure with hypoxia (HCC)   Lactic acidosis   History of intracerebral hemorrhage without residual deficit  Acute on chronic systolic CHF Patient recently hospitalized in the beginning of August with the same and had echocardiogram on 08/07/19 with LVEF down to 21% from 40 to 45% in 2020.   Presented with dyspnea at rest, pulmonary edema on chest x-ray, personally reviewed, bilateral lower extremity edema, elevated BNP greater than 1300. Ongoing diuresing with IV Lasix 40 mg twice daily Continue strict I's and O's and daily weight Continue other cardiac medications Cardiology consulted  Refractory hypokalemia Serum potassium 3.3 Ongoing replacement  Acute hypoxic respiratory failure secondary to CHF exacerbation Not on oxygen supplementation at baseline Currently on 3 L nasal cannula Wean off oxygen as tolerated Home oxygen evaluation prior to DC Maintain O2 saturation greater than 92%  Elevated troponin  Due to demand ischemia and CHF Peaked at 56 and trending down. Denies any anginal symptoms.  History of Covid-19 infection in October 2020 Fully vaccinated x2 doses Afebrile with no evidence of systemic infection  Lactic acidosis Due  to poor perfusion from acute CHF and low EF Lactic acid levels downtrending, continue diuresing  Paroxysmal atrial fibrillation Rate controlled on metoprolol, continue Off of anticoagulation due to recent intracranial bleed  Resolved, acute metabolic encephalopathy Back at his baseline mentation Recently had an  intracranial bleed about 2 weeks ago.  No acute findings on CT head  Hypertension with history of recent intracranial bleed BP is at goal Continue oral hypertensives and maintain a strict blood pressure with systolic goal below 045  CAD Denies any anginal symptoms Continue Lipitor. Off antiplatelets due to recent intracranial bleed  Hx of CVA/chronic right thalamic hemorrhage, likely attributed to hypertension MRI done on 09/01/2019 showing: 1. Expected evolution of the now chronic right thalamic hemorrhage. No underlying lesion, the hemorrhage is attributed to hypertension. 2. Chronic small vessel ischemia and small remote right occipital parietal infarcts. Currently not on antiplatelets Continue Lipitor Follow-up with neurology outpatient  Ambulatory dysfunction with recent fall PT to assess Fall precautions  DVT prophylaxis:.SCDs Code Status: Full Family Communication: Plan discussed with patient at bedside, also with daughter in person and with daughters via phone.  They agreed with plan.  Consult: Cardiology.  Status is: Inpatient   Dispo: The patient is from: Home.               Anticipated d/c is to: Home with home health services.              Anticipated d/c date is: 09/04/2019.               Patient currently not stable for discharge due to ongoing diuresing in the setting of acute on chronic systolic CHF.        Objective: Vitals:   09/02/19 0352 09/02/19 0814 09/02/19 0814 09/02/19 1202  BP: (!) 135/91 115/89 115/89 106/76  Pulse: 84 81 81 72  Resp: 16   18  Temp: (!) 97.2 F (36.2 C)   98.2 F (36.8 C)  TempSrc: Oral   Oral  SpO2: 100%   100%  Weight:      Height:        Intake/Output Summary (Last 24 hours) at 09/02/2019 1404 Last data filed at 09/02/2019 1331 Gross per 24 hour  Intake 810 ml  Output 2575 ml  Net -1765 ml   Filed Weights   08/31/19 0756 09/02/19 0012  Weight: 77.1 kg 74.5 kg    Exam:  . General: 86 y.o. year-old  male well-developed well-nourished no acute stress.  Very hard of hearing.  Alert and interactive . Cardiovascular: Regular rate and rhythm no rubs or gallops.   Marland Kitchen Respiratory: Mild rales at bases no wheezing noted.   . Abdomen: Soft nontender normal bowel sounds present. . Musculoskeletal: Improved 1+ pitting edema in lower extremities bilaterally. Marland Kitchen Psychiatry: Mood is appropriate for condition and setting.  Data Reviewed: CBC: Recent Labs  Lab 08/31/19 0809  WBC 6.9  HGB 13.7  HCT 42.5  MCV 93.4  PLT 409   Basic Metabolic Panel: Recent Labs  Lab 08/31/19 0809 09/01/19 0405 09/02/19 0245  NA 140 140 138  K 3.9 3.3* 3.3*  CL 103 102 102  CO2 24 28 25   GLUCOSE 122* 107* 93  BUN 20 22 21   CREATININE 1.20 1.32* 1.04  CALCIUM 9.2 9.0 8.6*   GFR: Estimated Creatinine Clearance: 50.2 mL/min (by C-G formula based on SCr of 1.04 mg/dL). Liver Function Tests: Recent Labs  Lab 08/31/19 1833  AST 33  ALT 41  ALKPHOS  103  BILITOT 2.1*  PROT 7.6  ALBUMIN 3.5   No results for input(s): LIPASE, AMYLASE in the last 168 hours. No results for input(s): AMMONIA in the last 168 hours. Coagulation Profile: No results for input(s): INR, PROTIME in the last 168 hours. Cardiac Enzymes: No results for input(s): CKTOTAL, CKMB, CKMBINDEX, TROPONINI in the last 168 hours. BNP (last 3 results) No results for input(s): PROBNP in the last 8760 hours. HbA1C: No results for input(s): HGBA1C in the last 72 hours. CBG: No results for input(s): GLUCAP in the last 168 hours. Lipid Profile: No results for input(s): CHOL, HDL, LDLCALC, TRIG, CHOLHDL, LDLDIRECT in the last 72 hours. Thyroid Function Tests: No results for input(s): TSH, T4TOTAL, FREET4, T3FREE, THYROIDAB in the last 72 hours. Anemia Panel: No results for input(s): VITAMINB12, FOLATE, FERRITIN, TIBC, IRON, RETICCTPCT in the last 72 hours. Urine analysis:    Component Value Date/Time   COLORURINE AMBER (A) 08/31/2019 1322    APPEARANCEUR HAZY (A) 08/31/2019 1322   LABSPEC 1.025 08/31/2019 1322   PHURINE 5.0 08/31/2019 1322   GLUCOSEU NEGATIVE 08/31/2019 1322   HGBUR SMALL (A) 08/31/2019 1322   BILIRUBINUR NEGATIVE 08/31/2019 1322   KETONESUR NEGATIVE 08/31/2019 1322   PROTEINUR 100 (A) 08/31/2019 1322   UROBILINOGEN 0.2 01/27/2011 1233   NITRITE NEGATIVE 08/31/2019 1322   LEUKOCYTESUR NEGATIVE 08/31/2019 1322   Sepsis Labs: @LABRCNTIP (procalcitonin:4,lacticidven:4)  ) Recent Results (from the past 240 hour(s))  SARS Coronavirus 2 by RT PCR (hospital order, performed in [place] hospital lab) Nasopharyngeal Nasopharyngeal Swab     Status: None   Collection Time: 08/31/19  7:13 PM   Specimen: Nasopharyngeal Swab  Result Value Ref Range Status   SARS Coronavirus 2 NEGATIVE NEGATIVE Final    Comment: (NOTE) SARS-CoV-2 target nucleic acids are NOT DETECTED.  The SARS-CoV-2 RNA is generally detectable in upper and lower respiratory specimens during the acute phase of infection. The lowest concentration of SARS-CoV-2 viral copies this assay can detect is 250 copies / mL. A negative result does not preclude SARS-CoV-2 infection and should not be used as the sole basis for treatment or other patient management decisions.  A negative result may occur with improper specimen collection / handling, submission of specimen other than nasopharyngeal swab, presence of viral mutation(s) within the areas targeted by this assay, and inadequate number of viral copies (<250 copies / mL). A negative result must be combined with clinical observations, patient history, and epidemiological information.  Fact Sheet for Patients:   StrictlyIdeas.no  Fact Sheet for Healthcare Providers: BankingDealers.co.za  This test is not yet approved or  cleared by the [place] FDA and has been authorized for detection and/or diagnosis of SARS-CoV-2 by FDA under an Emergency Use  Authorization (EUA).  This EUA will remain in effect (meaning this test can be used) for the duration of the COVID-19 declaration under Section 564(b)(1) of the Act, 21 U.S.C. section 360bbb-3(b)(1), unless the authorization is terminated or revoked sooner.  Performed at [facility], [place] [address] [place], [place] [address]       Studies: No results found.  Scheduled Meds: . atorvastatin  40 mg Oral QHS  . famotidine  20 mg Oral QHS  . furosemide  40 mg Intravenous BID  . losartan  25 mg Oral Daily  . metoprolol succinate  12.5 mg Oral BID WC  . potassium chloride  40 mEq Oral BID WC  . sodium chloride flush  3 mL Intravenous Q12H  Continuous Infusions: . sodium chloride       LOS: 2 days     Kayleen Memos, MD [facility] Pager [phone]  If 7PM-7AM, please contact night-coverage [email or web address] Password [facility] 09/02/2019, 2:04 PM

## 2019-09-02 NOTE — Progress Notes (Signed)
Risks of recurrent CVA from persistent afib greatly outweight the risks of complication from previous hemorrhage, will restart DOAC; curbsided with neurology, Dr. Cheral Marker, who is in agreement.  At discharge, patient will need to follow up with neurology outpatient.

## 2019-09-02 NOTE — Progress Notes (Deleted)
Patient has left with PTAR. 2/3 of the PIV has been removed. Flexiseal has also been removed.

## 2019-09-03 LAB — BASIC METABOLIC PANEL
Anion gap: 9 (ref 5–15)
BUN: 18 mg/dL (ref 8–23)
CO2: 32 mmol/L (ref 22–32)
Calcium: 8.8 mg/dL — ABNORMAL LOW (ref 8.9–10.3)
Chloride: 99 mmol/L (ref 98–111)
Creatinine, Ser: 1.35 mg/dL — ABNORMAL HIGH (ref 0.61–1.24)
GFR calc Af Amer: 55 mL/min — ABNORMAL LOW (ref >60)
GFR calc non Af Amer: 48 mL/min — ABNORMAL LOW (ref >60)
Glucose, Bld: 96 mg/dL (ref 70–99)
Potassium: 3.6 mmol/L (ref 3.5–5.1)
Sodium: 140 mmol/L (ref 135–145)

## 2019-09-03 LAB — MAGNESIUM: Magnesium: 1.7 mg/dL (ref 1.7–2.4)

## 2019-09-03 MED ORDER — MAGNESIUM SULFATE 2 GM/50ML IV SOLN
2.0000 g | Freq: Once | INTRAVENOUS | Status: AC
  Administered 2019-09-03: 2 g via INTRAVENOUS
  Filled 2019-09-03: qty 50

## 2019-09-03 MED ORDER — APIXABAN 5 MG PO TABS
5.0000 mg | ORAL_TABLET | Freq: Two times a day (BID) | ORAL | Status: DC
  Administered 2019-09-03 – 2019-09-06 (×8): 5 mg via ORAL
  Filled 2019-09-03 (×8): qty 1

## 2019-09-03 MED ORDER — FUROSEMIDE 40 MG PO TABS
40.0000 mg | ORAL_TABLET | Freq: Every day | ORAL | Status: DC
  Administered 2019-09-04 – 2019-09-05 (×2): 40 mg via ORAL
  Filled 2019-09-03 (×2): qty 1

## 2019-09-03 NOTE — Plan of Care (Signed)

## 2019-09-03 NOTE — Evaluation (Signed)
Physical Therapy Evaluation Patient Details Name: Brian Brown MRN: [id number] DOB: [birth year] Today's Date: 09/03/2019   History of Present Illness  Brian Brown is a 86 y.o. male with medical history significant for CVA w/ R sided weakness, CAD s/p NSTEMI, cardiomyopathy, paroxysmal atrial fibrillation off of Eliquis due to R thalamic ICH-likely due to uncontrolled hypertension, OSA not on CPAP, essential hypertension, hepatitis C who presents with concerns of chest pain and increasing shortness of breath.    Clinical Impression  Pt walked 120 feet with RW - O2 sats stayed >92% on RA.  Pt reports he is close to baseline.  I talked to him about going ALF but he isnt interested.  He said HH was just starting to work with him prior to admit.  He reports one fall in last 3 months.  He gets meals on Wheels 5x week.  Pt has limited support system.  PT will follow him on acute.    Follow Up Recommendations Home health PT;Supervision for mobility/OOB    Equipment Recommendations  None recommended by PT    Recommendations for Other Services       Precautions / Restrictions Precautions Precautions: Fall Precaution Comments: pt reports one fall in last 3 months Restrictions Weight Bearing Restrictions: No      Mobility  Bed Mobility Overal bed mobility: Needs Assistance Bed Mobility: Supine to Sit     Supine to sit: Supervision     General bed mobility comments: no physical assist needed, even from flat bed. Pt repositioned independently  Transfers Overall transfer level: Needs assistance Equipment used: Rolling walker (2 wheeled) Transfers: Sit to/from Stand Sit to Stand: Min guard         General transfer comment: pt cued for hand placment  Ambulation/Gait Ambulation/Gait assistance: Min guard Gait Distance (Feet): 120 Feet Assistive device: Rolling walker (2 wheeled) Gait Pattern/deviations: Step-to pattern;Decreased dorsiflexion - right;Shuffle;Decreased  stride length     General Gait Details: pt with some R foot drag noted with fatigue, otherwise reduced stride length, clearance, and slowed gait speed.  Pt got winded half way - stopped and caught his breath and then felt oK  Stairs            Wheelchair Mobility    Modified Rankin (Stroke Patients Only)       Balance Overall balance assessment: Mild deficits observed, not formally tested Sitting-balance support: No upper extremity supported;Feet supported Sitting balance-Leahy Scale: Good       Standing balance-Leahy Scale: Fair Standing balance comment: Pt stood with RW - denies dizziness                             Pertinent Vitals/Pain Pain Assessment: No/denies pain    Home Living Family/patient expects to be discharged to:: Private residence   Available Help at Discharge: Family;Available PRN/intermittently Type of Home: House Home Access: Stairs to enter Entrance Stairs-Rails: Psychiatric nurse of Steps: 3 Home Layout: One level Home Equipment: Walker - 4 wheels;Cane - single point Additional Comments: Pt lives alone with his dog.  He has 3 daughters who live out of state (they are visiting and want him to move up [place] but they work full time and pt not interested) , but who are supportive, and he has a grandson who lives locally    Prior Function Level of Independence: Independent with assistive device(s)         Comments: pt reports he has  been weaker and having to use RW when walking     Hand Dominance        Extremity/Trunk Assessment        Lower Extremity Assessment Lower Extremity Assessment: Generalized weakness    Cervical / Trunk Assessment Cervical / Trunk Assessment: Normal  Communication   Communication: No difficulties  Cognition Arousal/Alertness: Awake/alert Behavior During Therapy: WFL for tasks assessed/performed Overall Cognitive Status: Within Functional Limits for tasks assessed                                  General Comments: Pt slow to answer questions but cooperative and follows commands      General Comments      Exercises     Assessment/Plan    PT Assessment Patient needs continued PT services  PT Problem List Decreased strength;Decreased activity tolerance;Decreased balance;Decreased mobility;Decreased knowledge of use of DME;Decreased safety awareness;Cardiopulmonary status limiting activity       PT Treatment Interventions DME instruction;Gait training;Stair training;Functional mobility training;Therapeutic activities;Therapeutic exercise;Balance training;Neuromuscular re-education;Patient/family education    PT Goals (Current goals can be found in the Care Plan section)  Acute Rehab PT Goals Patient Stated Goal: go home PT Goal Formulation: With patient Time For Goal Achievement: 09/15/19 Potential to Achieve Goals: Good    Frequency Min 3X/week   Barriers to discharge Decreased caregiver support pt lives with dog.  Has yard guy that has been taking him out for s hort shopping trips    Co-evaluation               AM-PAC PT "6 Clicks" Mobility  Outcome Measure Help needed turning from your back to your side while in a flat bed without using bedrails?: None Help needed moving from lying on your back to sitting on the side of a flat bed without using bedrails?: A Little Help needed moving to and from a bed to a chair (including a wheelchair)?: A Little Help needed standing up from a chair using your arms (e.g., wheelchair or bedside chair)?: A Little Help needed to walk in hospital room?: A Little Help needed climbing 3-5 steps with a railing? : A Little 6 Click Score: 19    End of Session Equipment Utilized During Treatment: Gait belt Activity Tolerance: Patient tolerated treatment well Patient left: in chair;with call bell/phone within reach Nurse Communication: Mobility status PT Visit Diagnosis: Other abnormalities of  gait and mobility (R26.89);Muscle weakness (generalized) (M62.81)    Time: 6433-2951 PT Time Calculation (min) (ACUTE ONLY): 30 min   Charges:   PT Evaluation $PT Eval Low Complexity: 1 Low PT Treatments $Gait Training: 8-22 mins        09/03/2019   Rande Lawman, PT   Loyal Buba 09/03/2019, 4:56 PM

## 2019-09-03 NOTE — Progress Notes (Signed)
PROGRESS NOTE  Brian Brown ZOX:[id number] DOB: Apr 09, 1932 DOA: 08/31/2019 PCP: Seward Carol, MD  HPI/Recap of past 24 hours: HPI: Brian Brown is a 86 y.o. male with medical history significant for CVA w/ R sided weakness, CAD s/p NSTEMI, cardiomyopathy, paroxysmal atrial fibrillation off of Eliquis due to R thalamic ICH-likely due to uncontrolled hypertension, OSA not on CPAP, essential hypertension, hepatitis C who presents with concerns of chest pain and increasing shortness of breath.  He was recently hospitalized with acute on chronic systolic CHF and since returning home he says he has been feeling some discomfort to his chest and upper abdomen.  Denies any nausea or vomiting.  Patient was hospitalized from 8/1-8/6 for progressive fatigue and was found to be in acute on chronic systolic heart failure.  His EF had decreased from 45 to 50% down to 21%.  Hospital course was also complicated for acute mental status change and was found to have a right lateral thalamic intracranial hemorrhage.  His Eliquis for atrial fibrillation, and Plavix were discontinued.  He continues to take aspirin per neurology.   ED Course: He was afebrile, mildly tachycardic and hypertensive up to systolic of 811B over 147W.  Also later noted to have hypoxia with oxygen saturation down to 87% and was placed on 2 L.  BNP elevated to 1244.  Mildly elevated troponin up to 56 and then 51.  Also noted to have elevated lactate of 2.6 and up to 4.6.  Chest x-ray shows increase in hazy bilateral lung base opacity and cardiomegaly suggestive of fluid overload.  Seen cardiology and neurology.  Appreciate specialists recommendations.   09/03/19: Seen and examined with his daughter at bedside.  Denies any chest pain.  Reports persistent dyspnea with ambulation.  Net I&O -4.6 L  Assessment/Plan: Principal Problem:   Acute on chronic systolic CHF (congestive heart failure) (HCC) Active Problems:   PAF  (paroxysmal atrial fibrillation) (HCC)   Elevated troponin   Acute systolic heart failure (HCC)   AMS (altered mental status)   Acute respiratory failure with hypoxia (HCC)   Lactic acidosis   History of intracerebral hemorrhage without residual deficit   Persistent atrial fibrillation ([place])   Coronary artery disease  Acute on chronic systolic CHF Patient recently hospitalized in the beginning of August with the same and had echocardiogram on 08/07/19 with LVEF down to 21% from 40 to 45% in 2020.   Presented with dyspnea at rest, pulmonary edema on chest x-ray, bilateral lower extremity edema, elevated BNP greater than 1300. Ongoing diuresing with IV Lasix 40 mg daily Continue strict I's and O's and daily weight Continue cardiac medications, on Toprol-XL and losartan. Cardiology following, appreciate recommendations  Paroxysmal A. Fib CHA2DS2-VASc score 6 Rate controlled on Toprol-XL Eliquis restarted on 09/03/2019 Was previously held due to right thalamus ICH, now stable.  Repeat CT head without contrast on 09/05/2019.  Hx of CVA in 2020 Chronic right thalamic hemorrhage on 08/06/2019, likely attributed to hypertension MRI done on 09/01/2019 showing: 1. Expected evolution of the now chronic right thalamic hemorrhage. No underlying lesion, the hemorrhage is attributed to hypertension. 2. Chronic small vessel ischemia and small remote right occipital parietal infarcts. Currently not on antiplatelets Continue Lipitor Follow-up with neurology outpatient Repeat CT head without contrast on 09/05/2019.  Resolved refractory hypokalemia Serum potassium 3.3> 3.6. Ongoing replacement  Acute hypoxic respiratory failure: Improving, secondary to CHF exacerbation with pulmonary edema Not on oxygen supplementation at baseline Now 2 L nasal cannula with O2 saturation  95% Continue diuresing Continue to maintain O2 saturation greater than 92%.  Elevated troponin  Due to demand ischemia and  CHF Peaked at 56 and trending down. Denies any anginal symptoms.  History of Covid-19 infection in October 2020 Fully vaccinated x2 doses Afebrile with no evidence of systemic infection  Lactic acidosis Due to poor perfusion from acute CHF and low EF Lactic acid levels downtrending, continue diuresing  Resolved, acute metabolic encephalopathy Back at his baseline mentation Recently had an intracranial bleed about 3 weeks ago.  No acute findings on CT head or MRI brain.  Hypertension leading to right thalamic ICH. BP is at goal Maintain blood pressure less than 140/90 Continue current antihypertensives.  CAD Denies any anginal symptoms Continue Lipitor. Off antiplatelets due to recent intracranial bleed  Ambulatory dysfunction with recent fall PT to assess Fall precautions  DVT prophylaxis: Eliquis Code Status: Full Family Communication: Plan discussed with patient at bedside, and all 3 daughters.  They agreed with the plan.  Consult: Cardiology.  Status is: Inpatient   Dispo: The patient is from: Home.               Anticipated d/c is to: Home with home health services.              Anticipated d/c date is: 09/05/2019.               Patient currently not stable for discharge due to ongoing diuresing in the setting of acute on chronic systolic CHF.        Objective: Vitals:   09/03/19 0800 09/03/19 1155 09/03/19 1155 09/03/19 1200  BP: 123/90  103/89   Pulse: 90  (!) 41 77  Resp: 18  16 16   Temp:  97.7 F (36.5 C) 97.7 F (36.5 C)   TempSrc:   Oral   SpO2:   (!) 85% 100%  Weight:      Height:        Intake/Output Summary (Last 24 hours) at 09/03/2019 1529 Last data filed at 09/03/2019 1119 Gross per 24 hour  Intake 240 ml  Output 1100 ml  Net -860 ml   Filed Weights   08/31/19 0756 09/02/19 0012 09/03/19 0448  Weight: 77.1 kg 74.5 kg 71.3 kg    Exam:  . General: 86 y.o. year-old male pleasant well-developed well-nourished in no acute  stress.  Alert and interactive.   . Cardiovascular: Regular rate and rhythm no rubs or gallops.   Marland Kitchen Respiratory: Mild rales at bases no wheezing noted.   . Abdomen: Soft nontender normal bowel sounds present.. . Musculoskeletal: Improved 1+ pitting edema in lower extremities bilaterally.  Marland Kitchen Psychiatry: Mood is appropriate for condition and setting.   Data Reviewed: CBC: Recent Labs  Lab 08/31/19 0809  WBC 6.9  HGB 13.7  HCT 42.5  MCV 93.4  PLT 250   Basic Metabolic Panel: Recent Labs  Lab 08/31/19 0809 09/01/19 0405 09/02/19 0245 09/03/19 0453  NA 140 140 138 140  K 3.9 3.3* 3.3* 3.6  CL 103 102 102 99  CO2 24 28 25  32  GLUCOSE 122* 107* 93 96  BUN 20 22 21 18   CREATININE 1.20 1.32* 1.04 1.35*  CALCIUM 9.2 9.0 8.6* 8.8*  MG  --   --   --  1.7   GFR: Estimated Creatinine Clearance: 38.7 mL/min (A) (by C-G formula based on SCr of 1.35 mg/dL (H)). Liver Function Tests: Recent Labs  Lab 08/31/19 1833  AST 33  ALT 41  ALKPHOS 103  BILITOT 2.1*  PROT 7.6  ALBUMIN 3.5   No results for input(s): LIPASE, AMYLASE in the last 168 hours. No results for input(s): AMMONIA in the last 168 hours. Coagulation Profile: No results for input(s): INR, PROTIME in the last 168 hours. Cardiac Enzymes: No results for input(s): CKTOTAL, CKMB, CKMBINDEX, TROPONINI in the last 168 hours. BNP (last 3 results) No results for input(s): PROBNP in the last 8760 hours. HbA1C: No results for input(s): HGBA1C in the last 72 hours. CBG: No results for input(s): GLUCAP in the last 168 hours. Lipid Profile: No results for input(s): CHOL, HDL, LDLCALC, TRIG, CHOLHDL, LDLDIRECT in the last 72 hours. Thyroid Function Tests: No results for input(s): TSH, T4TOTAL, FREET4, T3FREE, THYROIDAB in the last 72 hours. Anemia Panel: No results for input(s): VITAMINB12, FOLATE, FERRITIN, TIBC, IRON, RETICCTPCT in the last 72 hours. Urine analysis:    Component Value Date/Time   COLORURINE AMBER (A)  08/31/2019 1322   APPEARANCEUR HAZY (A) 08/31/2019 1322   LABSPEC 1.025 08/31/2019 1322   PHURINE 5.0 08/31/2019 1322   GLUCOSEU NEGATIVE 08/31/2019 1322   HGBUR SMALL (A) 08/31/2019 1322   BILIRUBINUR NEGATIVE 08/31/2019 1322   KETONESUR NEGATIVE 08/31/2019 1322   PROTEINUR 100 (A) 08/31/2019 1322   UROBILINOGEN 0.2 01/27/2011 1233   NITRITE NEGATIVE 08/31/2019 1322   LEUKOCYTESUR NEGATIVE 08/31/2019 1322   Sepsis Labs: @LABRCNTIP (procalcitonin:4,lacticidven:4)  ) Recent Results (from the past 240 hour(s))  SARS Coronavirus 2 by RT PCR (hospital order, performed in [place] hospital lab) Nasopharyngeal Nasopharyngeal Swab     Status: None   Collection Time: 08/31/19  7:13 PM   Specimen: Nasopharyngeal Swab  Result Value Ref Range Status   SARS Coronavirus 2 NEGATIVE NEGATIVE Final    Comment: (NOTE) SARS-CoV-2 target nucleic acids are NOT DETECTED.  The SARS-CoV-2 RNA is generally detectable in upper and lower respiratory specimens during the acute phase of infection. The lowest concentration of SARS-CoV-2 viral copies this assay can detect is 250 copies / mL. A negative result does not preclude SARS-CoV-2 infection and should not be used as the sole basis for treatment or other patient management decisions.  A negative result may occur with improper specimen collection / handling, submission of specimen other than nasopharyngeal swab, presence of viral mutation(s) within the areas targeted by this assay, and inadequate number of viral copies (<250 copies / mL). A negative result must be combined with clinical observations, patient history, and epidemiological information.  Fact Sheet for Patients:   StrictlyIdeas.no  Fact Sheet for Healthcare Providers: BankingDealers.co.za  This test is not yet approved or  cleared by the [place] FDA and has been authorized for detection and/or diagnosis of SARS-CoV-2 by FDA under  an Emergency Use Authorization (EUA).  This EUA will remain in effect (meaning this test can be used) for the duration of the COVID-19 declaration under Section 564(b)(1) of the Act, 21 U.S.C. section 360bbb-3(b)(1), unless the authorization is terminated or revoked sooner.  Performed at [facility], [place] [address], [place], [place] [address]       Studies: No results found.  Scheduled Meds: . apixaban  5 mg Oral BID  . atorvastatin  40 mg Oral QHS  . famotidine  20 mg Oral QHS  . [START ON 09/04/2019] furosemide  40 mg Oral Daily  . losartan  25 mg Oral Daily  . metoprolol succinate  25 mg Oral BID WC  . potassium chloride  40 mEq Oral BID WC  .  sodium chloride flush  3 mL Intravenous Q12H    Continuous Infusions: . sodium chloride       LOS: 3 days     Kayleen Memos, MD [facility] Pager [phone]  If 7PM-7AM, please contact night-coverage [email or web address] Password [facility] 09/03/2019, 3:29 PM

## 2019-09-03 NOTE — Progress Notes (Signed)
ANTICOAGULATION CONSULT NOTE - Initial Consult  Pharmacy Consult for Apixaban  Indication: atrial fibrillation, stroke prevention  No Known Allergies  Patient Measurements: Height: 5\' 8"  (172.7 cm) Weight: 71.3 kg (157 lb 3 oz) IBW/kg (Calculated) : 68.4  Vital Signs: Temp: 98.4 F (36.9 C) (08/29 0448) Temp Source: Oral (08/29 0448) BP: 119/91 (08/29 0448) Pulse Rate: 84 (08/29 0448)  Labs: Recent Labs    08/31/19 0809 08/31/19 1833 08/31/19 2138 09/01/19 0405 09/02/19 0245  HGB 13.7  --   --   --   --   HCT 42.5  --   --   --   --   PLT 187  --   --   --   --   CREATININE 1.20  --   --  1.32* 1.04  TROPONINIHS  --  56* 51*  --   --     Estimated Creatinine Clearance: 50.2 mL/min (by C-G formula based on SCr of 1.04 mg/dL).   Medical History: Past Medical History:  Diagnosis Date  . Arthritis    degenerative joint disease  . Bleeding stomach ulcer 25 yrs ago  . CAD (coronary artery disease)    a. 10/2018 NSTEMI/Cath: LM nl, LAD 60p/m, 30d, D1 90, LCX 50ost/p, OM2 90 thrombotic, RCA 30p, 20d-->med rx.  . CKD (chronic kidney disease), stage II   . Hepatitis 1981   does not know what kind;denies jaundice  . HFrEF (heart failure with reduced ejection fraction) ([place])    a. 08/2019 Echo: EF 21%.  Marland Kitchen Hypertension   . Ischemic cardiomyopathy    a. 02/2018 Echo: EF 60-65%; b. 10/2018 Echo: EF 45-50%; c. 08/2019 Echo: EF 21%, mild LVH. RVSP 41.7mmHg. Mod BAE. Mild MR. Triv AI.  . Mild dilation of ascending aorta ([place])   . PAF (paroxysmal atrial fibrillation) ([place])    a. 10/2018 noted on Linq-->eliquis initiated (CHA2DS2VASc = 7) however d/c'd in 08/2019 2/2 cerebral bleed.  . Prostate cancer [facility]) 2002   prostate, s/p surgery  . Sleep apnea 2005   does not wear a Cpap  . Stroke [facility]    a. 2003 mild residual right leg wkns; b. 03/8754 embolic stroke, later found to have PAF.  Marland Kitchen Wears dentures    full top  . Wears glasses     Assessment: 86 y/o M whose Apixaban has  been on hold for the last 4 weeks due to [place]. Pt was evaluated by Dr. Rory Percy of neurology yesterday evening and thought be appropriate to resume Apixaban.   Goal of Therapy:  Monitor platelets by anticoagulation protocol: Yes   Plan:  Resume Apixaban 5 mg BID Daily CBC Monitor closely for any signs/symptoms of bleeding  Narda Bonds, PharmD, BCPS Clinical Pharmacist Phone: [phone]

## 2019-09-03 NOTE — Progress Notes (Addendum)
Progress Note  Patient Name: Brian Brown Date of Encounter: 09/03/2019  [facility] Cardiologist: Larae Grooms, MD   Subjective   Feels well at rest but became dyspneic walking to the bathroom.  Continues have excellent diuresis with net -4.6 L since admission.  Still has some lower extremity edema. Oblivious to the arrhythmia.  Ventricular rate mostly in the 80s at rest.  Inpatient Medications    Scheduled Meds: . apixaban  5 mg Oral BID  . atorvastatin  40 mg Oral QHS  . famotidine  20 mg Oral QHS  . furosemide  40 mg Intravenous BID  . losartan  25 mg Oral Daily  . metoprolol succinate  25 mg Oral BID WC  . potassium chloride  40 mEq Oral BID WC  . sodium chloride flush  3 mL Intravenous Q12H   Continuous Infusions: . sodium chloride     PRN Meds: sodium chloride, acetaminophen, ondansetron (ZOFRAN) IV, sodium chloride flush   Vital Signs    Vitals:   09/03/19 0800 09/03/19 0800 09/03/19 1155 09/03/19 1200  BP: 123/90 123/90 103/89   Pulse: 90 90 (!) 41 77  Resp:  18 16   Temp:   97.7 F (36.5 C)   TempSrc:   Oral   SpO2:   (!) 85% 100%  Weight:      Height:        Intake/Output Summary (Last 24 hours) at 09/03/2019 1219 Last data filed at 09/03/2019 1119 Gross per 24 hour  Intake 240 ml  Output 1750 ml  Net -1510 ml   Last 3 Weights 09/03/2019 09/02/2019 08/31/2019  Weight (lbs) 157 lb 3 oz 164 lb 3.9 oz 170 lb  Weight (kg) 71.3 kg 74.5 kg 77.111 kg      Telemetry    Atrial fibrillation with controlled ventricular rates- Personally Reviewed  ECG    No new tracing- Personally Reviewed  Physical Exam  Elderly, frail and lean GEN: No acute distress.   Neck: No JVD Cardiac: irregular, no murmurs, rubs, or gallops.  Respiratory: Clear to auscultation bilaterally. GI: Soft, nontender, non-distended  MS: 1+ ankle edema on L, 2+ edema on right; No deformity. Neuro:  Nonfocal  Psych: Normal affect   Labs    High Sensitivity Troponin:    Recent Labs  Lab 08/06/19 1654 08/06/19 1854 08/31/19 1833 08/31/19 2138  TROPONINIHS 35* 35* 56* 51*      Chemistry Recent Labs  Lab 08/31/19 0809 08/31/19 1833 09/01/19 0405 09/02/19 0245 09/03/19 0453  NA   < >  --  140 138 140  K   < >  --  3.3* 3.3* 3.6  CL   < >  --  102 102 99  CO2   < >  --  28 25 32  GLUCOSE   < >  --  107* 93 96  BUN   < >  --  22 21 18   CREATININE   < >  --  1.32* 1.04 1.35*  CALCIUM   < >  --  9.0 8.6* 8.8*  PROT  --  7.6  --   --   --   ALBUMIN  --  3.5  --   --   --   AST  --  33  --   --   --   ALT  --  41  --   --   --   ALKPHOS  --  103  --   --   --  BILITOT  --  2.1*  --   --   --   GFRNONAA   < >  --  49* >60 48*  GFRAA   < >  --  57* >60 55*  ANIONGAP   < >  --  10 11 9    < > = values in this interval not displayed.     Hematology Recent Labs  Lab 08/31/19 0809  WBC 6.9  RBC 4.55  HGB 13.7  HCT 42.5  MCV 93.4  MCH 30.1  MCHC 32.2  RDW 14.5  PLT 187    BNP Recent Labs  Lab 08/31/19 1833  BNP 1,344.2*     DDimer No results for input(s): DDIMER in the last 168 hours.   Radiology    No results found.  Cardiac Studies   Echocardiogram 08/07/2019 1. Left ventricular ejection fraction by 3D volume is 21 %. The left  ventricle has severely decreased function. The left ventricle demonstrates  regional wall motion abnormalities (see scoring diagram/findings for  description). There is mild left  ventricular hypertrophy. Left ventricular diastolic parameters are  indeterminate.  2. Right ventricular systolic function is moderately reduced. The right  ventricular size is normal. There is mildly elevated pulmonary artery  systolic pressure. The estimated right ventricular systolic pressure is  28.4 mmHg.  3. Left atrial size was moderately dilated.  4. Right atrial size was moderately dilated.  5. The mitral valve is degenerative. Mild mitral valve regurgitation. No  evidence of mitral stenosis.  6.  The aortic valve is abnormal. Aortic valve regurgitation is trivial.  No aortic stenosis is present.  7. The inferior vena cava is dilated in size with <50% respiratory  variability, suggesting right atrial pressure of 15 mmHg.   Patient Profile     86 y.o. 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  Acute on chronic heart failure with reduced ejection fraction: Echo performed during hospitalization for intracranial hemorrhage shows reduction in EF to 20% from previous baseline of 45%.  It is quite possible that that was an episode of stress cardiomyopathy (takotsubo sd).  Unlikely that he has had significant progression of CAD in the last 10 months.  Remains volume overloaded, with possible contribution of sodium excess from "[employer]" in the last several weeks. It appears that he tolerated AFib well for > 2 months before the ICH. Guideline directed medical therapy for heart failure including metoprolol succinate and losartan therapy, but doses are limited by relatively low blood pressure.  His blood pressure is soft at 106/76, preventing [place] from switching him from losartan to [facility], or adding an MRA.   Recheck limited echo.  If he had takotsubo syndrome related to intracranial hemorrhage, it should have resolved by now.  2.  Coronary artery disease/demand ischemia: Status post non-STEMI in October 2020 with severe diagonal and obtuse marginal (thrombotic) disease and otherwise nonobstructive disease.    He had exertional chest discomfort prior to admission, pattern of stable angina that may have been precipitated by volume overload.  He has not had any chest discomfort since  admission.  Continue beta-blocker and statin therapy.    He did not have  disease in any of the major coronary arteries and is not a good candidate for percutaneous revascularization.  3.    Persistent atrial fibrillation: Loop recorder interrogation shows he has been in persistent atrial fibrillation for the last 3 months, mostly with good ventricular rate control, with a recent increase in ventricular rates in the last week or 2 when he had heart failure exacerbation.  At substantial risk for recurrent stroke (CHA2DS2-VASc at least 6: Stroke 2, age 42, CHF, CAD).  4.  Right thalamic hemorrhage: Appreciate neurology's advice.  I do not see any reason to resume treatment with antiplatelet agents at this point, almost 1 year since his last acute coronary event.  Resuming anticoagulation alone has substantial risk of recurrent bleeding anyway.  Had a long discussion with the patient and his daughters regarding the risk/benefit ratio of anticoagulation.  Hard to decide which approach is better.  After a lot of appropriate questions which I tried to answer as accurately as I could, we decided the best approach is to restart treatment with Eliquis.  Per neurology recommendations, we should obtain a CT of the head in 1-2 days to look for recurrence of bleeding.  5.  Essential hypertension: On current medications, his blood pressure is on the low end of normal.  6.  Hyperlipidemia: LDL of 70 earlier this month.  7.  Stage II chronic kidney disease: Slight increase in creatinine today.  Will switch to oral diuretics       For questions or updates, please contact [place] Please consult [email or web address] for contact info under        Signed, Sanda Klein, MD  09/03/2019, 12:19 PM

## 2019-09-03 NOTE — Progress Notes (Signed)
   09/03/19 1200  Assess: MEWS Score  Pulse Rate 77  ECG Heart Rate 83  Resp 16  SpO2 100 % (pt sat up to semi fowlers, took deep breaths)  O2 Device Nasal Cannula  O2 Flow Rate (L/min) 3 L/min   RN reassessed. Error of the dinamap.

## 2019-09-04 LAB — CBC
HCT: 38.8 % — ABNORMAL LOW (ref 39.0–52.0)
Hemoglobin: 12.6 g/dL — ABNORMAL LOW (ref 13.0–17.0)
MCH: 29.9 pg (ref 26.0–34.0)
MCHC: 32.5 g/dL (ref 30.0–36.0)
MCV: 92.2 fL (ref 80.0–100.0)
Platelets: 167 10*3/uL (ref 150–400)
RBC: 4.21 MIL/uL — ABNORMAL LOW (ref 4.22–5.81)
RDW: 14.1 % (ref 11.5–15.5)
WBC: 6.5 10*3/uL (ref 4.0–10.5)
nRBC: 0 % (ref 0.0–0.2)

## 2019-09-04 LAB — BASIC METABOLIC PANEL
Anion gap: 8 (ref 5–15)
BUN: 24 mg/dL — ABNORMAL HIGH (ref 8–23)
CO2: 30 mmol/L (ref 22–32)
Calcium: 8.9 mg/dL (ref 8.9–10.3)
Chloride: 100 mmol/L (ref 98–111)
Creatinine, Ser: 1.35 mg/dL — ABNORMAL HIGH (ref 0.61–1.24)
GFR calc Af Amer: 55 mL/min — ABNORMAL LOW (ref >60)
GFR calc non Af Amer: 48 mL/min — ABNORMAL LOW (ref >60)
Glucose, Bld: 103 mg/dL — ABNORMAL HIGH (ref 70–99)
Potassium: 3.8 mmol/L (ref 3.5–5.1)
Sodium: 138 mmol/L (ref 135–145)

## 2019-09-04 MED ORDER — LOSARTAN POTASSIUM 25 MG PO TABS
25.0000 mg | ORAL_TABLET | Freq: Two times a day (BID) | ORAL | Status: DC
  Administered 2019-09-04 – 2019-09-06 (×4): 25 mg via ORAL
  Filled 2019-09-04 (×4): qty 1

## 2019-09-04 NOTE — Progress Notes (Signed)
PROGRESS NOTE  Brian Brown JSH:[id number] DOB: [birth year] DOA: 08/31/2019 PCP: Seward Carol, MD  HPI/Recap of past 24 hours: HPI: Brian Brown is a 86 y.o. male with medical history significant for CVA w/ R sided weakness, CAD s/p NSTEMI, cardiomyopathy, paroxysmal atrial fibrillation, R thalamic ICH-likely due to uncontrolled hypertension, OSA not on CPAP, essential hypertension, hepatitis C who presents with concerns of chest pain and increasing shortness of breath.  He was recently hospitalized with acute on chronic systolic CHF and since returning home he says he has been feeling some discomfort to his chest and upper abdomen.    Patient was hospitalized from 8/1-8/6 for progressive fatigue and was found to be in acute on chronic systolic heart failure.  His EF had decreased from 45 to 50% down to 21%.  Hospital course was also complicated for acute mental status change and was found to have a right lateral thalamic intracranial hemorrhage.  His Eliquis for atrial fibrillation, and Plavix were held.  He continued to take aspirin per neurology.   ED Course: Hypertensive up to systolic of 850Y over 774J.  Also later noted to have hypoxia with oxygen saturation down to 87% and was placed on 2 L.  BNP elevated to 1244.  Mildly elevated troponin up to 56 and then 51.  Also noted to have elevated lactate of 2.6 and up to 4.6.  Chest x-ray shows increase in hazy bilateral lung base opacity and cardiomegaly suggestive of fluid overload.  Received IV diuresing.  Seen by cardiology and neurology.  Appreciate specialists recommendations.  -MRI brain done on 09/01/2019 to reassess right thalamic ICH, was stable with no acute bleeding.  Per neurology on 09/03/2019, okay to resume Eliquis and repeat CT head on 09/05/2019.   09/04/19: Seen and examined.  Feels better today and his breathing is improved.  Currently on room air.    Assessment/Plan: Principal Problem:   Acute on chronic  systolic CHF (congestive heart failure) (HCC) Active Problems:   PAF (paroxysmal atrial fibrillation) (HCC)   Elevated troponin   Acute systolic heart failure (HCC)   AMS (altered mental status)   Acute respiratory failure with hypoxia (HCC)   Lactic acidosis   History of intracerebral hemorrhage without residual deficit   Persistent atrial fibrillation ([place])   Coronary artery disease  Acute on chronic systolic CHF Patient recently hospitalized in the beginning of August with the same and had echocardiogram on 08/07/19 with LVEF down to 21% from 40 to 45% in 2020.   Presented with dyspnea at rest, pulmonary edema on chest x-ray, bilateral lower extremity edema, elevated BNP greater than 1300. Received IV diuresing, switched to oral Lasix 40 mg daily on 09/04/2019 Continue strict I's and O's and daily weight Continue cardiac medications, on Toprol-XL and losartan. Cardiology following, appreciate recommendations Net I&O -4.4 L  Paroxysmal A. Fib CHA2DS2-VASc score 6 Rate controlled on Toprol-XL Eliquis restarted on 09/03/2019 Was previously held due to right thalamus ICH, now stable.   Ordered Repeat CT head without contrast on 09/05/2019.  Hx of CVA in 2020 Chronic right thalamic hemorrhage on 08/06/2019, likely secondary to uncontrolled hypertension  MRI done on 09/01/2019 showing: 1. Expected evolution of the now chronic right thalamic hemorrhage. No underlying lesion, the hemorrhage is attributed to hypertension. 2. Chronic small vessel ischemia and small remote right occipital parietal infarcts. Currently not on antiplatelets Continue Lipitor Follow-up with neurology outpatient Repeat CT head without contrast on 09/05/2019.  Resolved refractory hypokalemia Serum potassium 3.3> 3.6>  3.8 Ongoing replacement  Acute hypoxic respiratory failure: Improving, secondary to CHF exacerbation with pulmonary edema Not on oxygen supplementation at baseline Now on room air. Obtain home O2  evaluation prior to DC Maintain O2 saturation greater than 92%.  Elevated troponin  Due to demand ischemia and CHF Peaked at 56 and trending down. Denies any anginal symptoms.  History of Covid-19 infection in October 2020 Fully vaccinated x2 doses Afebrile with no evidence of systemic infection  Lactic acidosis Due to poor perfusion from acute CHF and low EF Lactic acid levels downtrending, continue diuresing  Resolved, acute metabolic encephalopathy Back at his baseline mentation Recently had an intracranial bleed at the beginning of August..  No acute findings on CT head or MRI brain. CT head no contrast repeat 09/05/2019  Hypertension leading to right thalamic ICH. BP is at goal Maintain blood pressure less than 140/90 Continue current antihypertensives.  CAD Denies any anginal symptoms Continue Lipitor. Off antiplatelets due to recent intracranial bleed, no need to restart while on Eliquis per cardiology  Ambulatory dysfunction with recent fall PT recommended home health PT OT TOC assisting Fall precautions  DVT prophylaxis: Eliquis Code Status: Full Family Communication: Plan discussed with patient at bedside.  Updated his daughter from 09/03/2019.  Consult: Cardiology.  Status is: Inpatient   Dispo: The patient is from: Home.               Anticipated d/c is to: Home with home health services.              Anticipated d/c date is: 09/05/2019.               Patient currently not stable for discharge due to ongoing diuresing in the setting of acute on chronic systolic CHF.        Objective: Vitals:   09/04/19 0135 09/04/19 0437 09/04/19 1221 09/04/19 1227  BP:  (!) 133/99  (!) 133/122  Pulse:  73  (!) 47  Resp:  20  17  Temp:  98 F (36.7 C) 97.8 F (36.6 C)   TempSrc:  Oral Oral   SpO2:  94%  93%  Weight: 71.8 kg     Height:        Intake/Output Summary (Last 24 hours) at 09/04/2019 1613 Last data filed at 09/04/2019 1322 Gross per 24  hour  Intake 880 ml  Output 700 ml  Net 180 ml   Filed Weights   09/02/19 0012 09/03/19 0448 09/04/19 0135  Weight: 74.5 kg 71.3 kg 71.8 kg    Exam:  . General: 86 y.o. year-old male pleasant well-developed well-nourished in no acute distress.  Alert and oriented x3.   . Cardiovascular: Irregular rate and rhythm no rubs or gallops.   Marland Kitchen Respiratory: Clear to auscultation no wheezes no rales.   . Abdomen: Soft nontender normal bowel sounds present.   . Musculoskeletal: Improved bilateral lower extremity edema. Marland Kitchen Psychiatry: Mood is appropriate for condition and setting.  Data Reviewed: CBC: Recent Labs  Lab 08/31/19 0809 09/04/19 0453  WBC 6.9 6.5  HGB 13.7 12.6*  HCT 42.5 38.8*  MCV 93.4 92.2  PLT 187 161   Basic Metabolic Panel: Recent Labs  Lab 08/31/19 0809 09/01/19 0405 09/02/19 0245 09/03/19 0453 09/04/19 0453  NA 140 140 138 140 138  K 3.9 3.3* 3.3* 3.6 3.8  CL 103 102 102 99 100  CO2 24 28 25  32 30  GLUCOSE 122* 107* 93 96 103*  BUN 20 22 21  18 24*  CREATININE 1.20 1.32* 1.04 1.35* 1.35*  CALCIUM 9.2 9.0 8.6* 8.8* 8.9  MG  --   --   --  1.7  --    GFR: Estimated Creatinine Clearance: 38.7 mL/min (A) (by C-G formula based on SCr of 1.35 mg/dL (H)). Liver Function Tests: Recent Labs  Lab 08/31/19 1833  AST 33  ALT 41  ALKPHOS 103  BILITOT 2.1*  PROT 7.6  ALBUMIN 3.5   No results for input(s): LIPASE, AMYLASE in the last 168 hours. No results for input(s): AMMONIA in the last 168 hours. Coagulation Profile: No results for input(s): INR, PROTIME in the last 168 hours. Cardiac Enzymes: No results for input(s): CKTOTAL, CKMB, CKMBINDEX, TROPONINI in the last 168 hours. BNP (last 3 results) No results for input(s): PROBNP in the last 8760 hours. HbA1C: No results for input(s): HGBA1C in the last 72 hours. CBG: No results for input(s): GLUCAP in the last 168 hours. Lipid Profile: No results for input(s): CHOL, HDL, LDLCALC, TRIG, CHOLHDL,  LDLDIRECT in the last 72 hours. Thyroid Function Tests: No results for input(s): TSH, T4TOTAL, FREET4, T3FREE, THYROIDAB in the last 72 hours. Anemia Panel: No results for input(s): VITAMINB12, FOLATE, FERRITIN, TIBC, IRON, RETICCTPCT in the last 72 hours. Urine analysis:    Component Value Date/Time   COLORURINE AMBER (A) 08/31/2019 1322   APPEARANCEUR HAZY (A) 08/31/2019 1322   LABSPEC 1.025 08/31/2019 1322   PHURINE 5.0 08/31/2019 1322   GLUCOSEU NEGATIVE 08/31/2019 1322   HGBUR SMALL (A) 08/31/2019 1322   BILIRUBINUR NEGATIVE 08/31/2019 1322   KETONESUR NEGATIVE 08/31/2019 1322   PROTEINUR 100 (A) 08/31/2019 1322   UROBILINOGEN 0.2 01/27/2011 1233   NITRITE NEGATIVE 08/31/2019 1322   LEUKOCYTESUR NEGATIVE 08/31/2019 1322   Sepsis Labs: @LABRCNTIP (procalcitonin:4,lacticidven:4)  ) Recent Results (from the past 240 hour(s))  SARS Coronavirus 2 by RT PCR (hospital order, performed in [place] hospital lab) Nasopharyngeal Nasopharyngeal Swab     Status: None   Collection Time: 08/31/19  7:13 PM   Specimen: Nasopharyngeal Swab  Result Value Ref Range Status   SARS Coronavirus 2 NEGATIVE NEGATIVE Final    Comment: (NOTE) SARS-CoV-2 target nucleic acids are NOT DETECTED.  The SARS-CoV-2 RNA is generally detectable in upper and lower respiratory specimens during the acute phase of infection. The lowest concentration of SARS-CoV-2 viral copies this assay can detect is 250 copies / mL. A negative result does not preclude SARS-CoV-2 infection and should not be used as the sole basis for treatment or other patient management decisions.  A negative result may occur with improper specimen collection / handling, submission of specimen other than nasopharyngeal swab, presence of viral mutation(s) within the areas targeted by this assay, and inadequate number of viral copies (<250 copies / mL). A negative result must be combined with clinical observations, patient history, and  epidemiological information.  Fact Sheet for Patients:   StrictlyIdeas.no  Fact Sheet for Healthcare Providers: BankingDealers.co.za  This test is not yet approved or  cleared by the [place] FDA and has been authorized for detection and/or diagnosis of SARS-CoV-2 by FDA under an Emergency Use Authorization (EUA).  This EUA will remain in effect (meaning this test can be used) for the duration of the COVID-19 declaration under Section 564(b)(1) of the Act, 21 U.S.C. section 360bbb-3(b)(1), unless the authorization is terminated or revoked sooner.  Performed at [facility], [place] [address] [place], [place] [address]       Studies: No results  found.  Scheduled Meds: . apixaban  5 mg Oral BID  . atorvastatin  40 mg Oral QHS  . famotidine  20 mg Oral QHS  . furosemide  40 mg Oral Daily  . losartan  25 mg Oral BID  . metoprolol succinate  25 mg Oral BID WC  . potassium chloride  40 mEq Oral BID WC  . sodium chloride flush  3 mL Intravenous Q12H    Continuous Infusions: . sodium chloride       LOS: 4 days     Kayleen Memos, MD [facility] Pager [phone]  If 7PM-7AM, please contact night-coverage [email or web address] Password [email or web address] 09/04/2019, 4:13 PM

## 2019-09-04 NOTE — TOC Initial Note (Addendum)
Transition of Care [facility]) - Initial/Assessment Note    Patient Details  Name: Brian Brown MRN: [id number] Date of Birth: November 06, 1932  Transition of [facility]) CM/SW Contact:    Zenon Mayo, RN Phone Number: 09/04/2019, 4:03 PM  Clinical Narrative:                 Patient is from home alone, he is active with [facility] for [place].  NCM spoke with patient and daughter at bedside, they would like to continue with [facility].  MD added HHOT to [facility] services.  SOC will begin 24 to 48 hrs post dc.  He has a walker at home. Daughter will transport him home at dc.  He has no issues with getting medications.  Daughter states he has someone that will be checking on him at all times.    Expected Discharge Plan: [place] Barriers to Discharge: Continued Medical Work up   Patient Goals and CMS Choice Patient states their goals for this hospitalization and ongoing recovery are:: get better CMS Medicare.gov Compare Post Acute Care list provided to:: Patient Represenative (must comment) Choice offered to / list presented to : Adult Children  Expected Discharge Plan and Services Expected Discharge Plan: [place] In-house Referral: NA Discharge Planning Services: CM Consult Post Acute Care Choice: Home Health, Resumption of Svcs/PTA Provider Living arrangements for the past 2 months: Single Family Home                   DME Agency: NA       HH Arranged: PT, OT HH Agency: [facility] [employer]) Date [place]: 09/04/19 Time [place]: 1603 Representative spoke with at [place]: [place] Arrangements/Services Living arrangements for the past 2 months: [place] Lives with:: Self Patient language and need for interpreter reviewed:: Yes Do you feel safe going back to the place where you live?: Yes      Need for Family Participation in Patient Care: Yes (Comment) Care giver support system in  place?: Yes (comment) Current home services: DME (walker) Criminal Activity/Legal Involvement Pertinent to Current Situation/Hospitalization: No - Comment as needed  Activities of Daily Living      Permission Sought/Granted                  Emotional Assessment Appearance:: Appears stated age Attitude/Demeanor/Rapport: Engaged Affect (typically observed): Appropriate Orientation: : Oriented to Self, Oriented to Place, Oriented to  Time, Oriented to Situation Alcohol / Substance Use: Not Applicable Psych Involvement: No (comment)  Admission diagnosis:  Acute systolic heart failure (HCC) [I50.21] Encephalopathy acute [G93.40] Acute on chronic systolic congestive heart failure ([place]) [I50.23] Patient Active Problem List   Diagnosis Date Noted  . Persistent atrial fibrillation ([place])   . Coronary artery disease   . Acute systolic heart failure ([place]) 08/31/2019  . Acute on chronic systolic CHF (congestive heart failure) ([place]) 08/31/2019  . AMS (altered mental status) 08/31/2019  . Acute respiratory failure with hypoxia ([place]) 08/31/2019  . Lactic acidosis 08/31/2019  . History of intracerebral hemorrhage without residual deficit 08/31/2019  . Protein-calorie malnutrition, severe 08/08/2019  . Intracerebral hemorrhage ([place]) 08/07/2019  . Acute CHF (congestive heart failure) ([place]) 08/06/2019  . AKI (acute kidney injury) ([place]) 08/06/2019  . Elevated troponin 08/06/2019  . Transaminitis 08/06/2019  . Closed rib fracture 08/06/2019  . History of CVA (cerebrovascular accident) 08/06/2019  . PAF (paroxysmal atrial fibrillation) (  [place]) 10/20/2018  . NSTEMI (non-ST elevated myocardial infarction) ([place])   . Acute CVA (cerebrovascular accident) ([place]) 02/14/2018  . TIA (transient ischemic attack) 02/13/2018  . Hepatitis C reactive 02/06/2011  . Hyponatremia 02/06/2011  . Postoperative anemia due to acute blood loss 02/05/2011  . Hypokalemia 02/05/2011  . Osteoarthritis of hip  01/27/2011  . Hypertension 01/27/2011  . Sleep apnea 01/27/2011  . Stroke/cerebrovascular accident ([place]) 01/27/2011  . Cancer of prostate ([place]) 01/27/2011   PCP:  Seward Carol, MD Pharmacy:   [id number] - [place], [place] - [address] [place] [address] [place] [address] Phone: [phone] Fax: [phone]  [facility] - [place], [place] - [address] [place] [address] Phone: [phone] Fax: [phone]  [facility] [place], [place] [address] [place] [address] Phone: [phone] Fax: [phone]     Social Determinants of Health (SDOH) Interventions    Readmission Risk Interventions Readmission Risk Prevention Plan 09/04/2019  Transportation Screening Complete  PCP or Specialist Appt within 3-5 Days Complete  HRI or [place] Complete  Social Work Consult for [place] Planning/Counseling Complete  Palliative Care Screening Not Applicable  Medication Review Press photographer) Complete  Some recent data might be hidden

## 2019-09-04 NOTE — Progress Notes (Signed)
Progress Note  Patient Name: Brian Brown Date of Encounter: 09/04/2019  Primary Cardiologist:   Larae Grooms, MD   Subjective   Slightly confused this morning.  No pain.  No SOB.   Inpatient Medications    Scheduled Meds: . apixaban  5 mg Oral BID  . atorvastatin  40 mg Oral QHS  . famotidine  20 mg Oral QHS  . furosemide  40 mg Oral Daily  . losartan  25 mg Oral Daily  . metoprolol succinate  25 mg Oral BID WC  . potassium chloride  40 mEq Oral BID WC  . sodium chloride flush  3 mL Intravenous Q12H   Continuous Infusions: . sodium chloride     PRN Meds: sodium chloride, acetaminophen, ondansetron (ZOFRAN) IV, sodium chloride flush   Vital Signs    Vitals:   09/03/19 1703 09/03/19 2046 09/04/19 0135 09/04/19 0437  BP: (!) 127/96 126/88  (!) 133/99  Pulse: (!) 59 80  73  Resp:  19  20  Temp:  98.1 F (36.7 C)  98 F (36.7 C)  TempSrc:  Oral  Oral  SpO2: (!) 67% 100%  94%  Weight:   71.8 kg   Height:        Intake/Output Summary (Last 24 hours) at 09/04/2019 0855 Last data filed at 09/04/2019 0440 Gross per 24 hour  Intake 480 ml  Output 1450 ml  Net -970 ml   Filed Weights   09/02/19 0012 09/03/19 0448 09/04/19 0135  Weight: 74.5 kg 71.3 kg 71.8 kg    Telemetry    Atrial fib with controlled ventricular rate.  - Personally Reviewed  ECG    NA - Personally Reviewed  Physical Exam   GEN: No acute distress.   Neck: No  JVD Cardiac: Irregular RR, no murmurs, rubs, or gallops.  Respiratory:   Decreased breath sounds at the bases GI: Soft, nontender, non-distended  MS: No  edema; No deformity. Neuro:  Nonfocal  Psych: Normal affect   Labs    Chemistry Recent Labs  Lab 08/31/19 1833 09/01/19 0405 09/02/19 0245 09/03/19 0453 09/04/19 0453  NA  --    < > 138 140 138  K  --    < > 3.3* 3.6 3.8  CL  --    < > 102 99 100  CO2  --    < > 25 32 30  GLUCOSE  --    < > 93 96 103*  BUN  --    < > 21 18 24*  CREATININE  --    < >  1.04 1.35* 1.35*  CALCIUM  --    < > 8.6* 8.8* 8.9  PROT 7.6  --   --   --   --   ALBUMIN 3.5  --   --   --   --   AST 33  --   --   --   --   ALT 41  --   --   --   --   ALKPHOS 103  --   --   --   --   BILITOT 2.1*  --   --   --   --   GFRNONAA  --    < > >60 48* 48*  GFRAA  --    < > >60 55* 55*  ANIONGAP  --    < > 11 9 8    < > = values in this interval not displayed.  Hematology Recent Labs  Lab 08/31/19 0809 09/04/19 0453  WBC 6.9 6.5  RBC 4.55 4.21*  HGB 13.7 12.6*  HCT 42.5 38.8*  MCV 93.4 92.2  MCH 30.1 29.9  MCHC 32.2 32.5  RDW 14.5 14.1  PLT 187 167    Cardiac EnzymesNo results for input(s): TROPONINI in the last 168 hours. No results for input(s): TROPIPOC in the last 168 hours.   BNP Recent Labs  Lab 08/31/19 1833  BNP 1,344.2*     DDimer No results for input(s): DDIMER in the last 168 hours.   Radiology    No results found.  Cardiac Studies   Echocardiogram 08/07/2019 1. Left ventricular ejection fraction by 3D volume is 21 %. The left  ventricle has severely decreased function. The left ventricle demonstrates  regional wall motion abnormalities (see scoring diagram/findings for  description). There is mild left  ventricular hypertrophy. Left ventricular diastolic parameters are  indeterminate.  2. Right ventricular systolic function is moderately reduced. The right  ventricular size is normal. There is mildly elevated pulmonary artery  systolic pressure. The estimated right ventricular systolic pressure is  16.0 mmHg.  3. Left atrial size was moderately dilated.  4. Right atrial size was moderately dilated.  5. The mitral valve is degenerative. Mild mitral valve regurgitation. No  evidence of mitral stenosis.  6. The aortic valve is abnormal. Aortic valve regurgitation is trivial.  No aortic stenosis is present.  7. The inferior vena cava is dilated in size with <50% respiratory  variability, suggesting right atrial pressure of  15 mmHg.   Patient Profile     86 y.o. 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  We have been unable to titrate meds with soft BP.  However, BP seems to be creeping up and I will slowly increase the Cozaar.   I/O -850 over 24 hours.    CAD:  Medical management.  No active ischemic symptoms.    Atrial fib:  Restarting Eliquis.  See the discussion yesterday per Dr. Sallyanne Kuster.    HTN:    This is being managed in the context of treating his CHF.    Dyslipidemia: LDL at target.  Continue current therapy.    CKD II: Creat is stable.  Follow closely.     For questions or updates, please contact [place] Please consult [email or web address] for contact info under Cardiology/STEMI.   Signed, Minus Breeding, MD  09/04/2019, 8:55 AM

## 2019-09-04 NOTE — Progress Notes (Addendum)
Pt's daughter would like to speak to Dr. Nevada Crane for an update on pt's care. Please call her at [phone]. Thanks   Fransico Michael, RN

## 2019-09-05 ENCOUNTER — Inpatient Hospital Stay (HOSPITAL_COMMUNITY): Payer: Medicare HMO

## 2019-09-05 DIAGNOSIS — I361 Nonrheumatic tricuspid (valve) insufficiency: Secondary | ICD-10-CM

## 2019-09-05 DIAGNOSIS — I340 Nonrheumatic mitral (valve) insufficiency: Secondary | ICD-10-CM

## 2019-09-05 LAB — ECHOCARDIOGRAM LIMITED
Area-P 1/2: 6.17 cm2
Height: 68 in
S' Lateral: 4.3 cm
Weight: 2537.6 [oz_av]

## 2019-09-05 LAB — BASIC METABOLIC PANEL
Anion gap: 11 (ref 5–15)
BUN: 27 mg/dL — ABNORMAL HIGH (ref 8–23)
CO2: 26 mmol/L (ref 22–32)
Calcium: 9.1 mg/dL (ref 8.9–10.3)
Chloride: 102 mmol/L (ref 98–111)
Creatinine, Ser: 1.4 mg/dL — ABNORMAL HIGH (ref 0.61–1.24)
GFR calc Af Amer: 53 mL/min — ABNORMAL LOW (ref >60)
GFR calc non Af Amer: 45 mL/min — ABNORMAL LOW (ref >60)
Glucose, Bld: 110 mg/dL — ABNORMAL HIGH (ref 70–99)
Potassium: 4.9 mmol/L (ref 3.5–5.1)
Sodium: 139 mmol/L (ref 135–145)

## 2019-09-05 LAB — CBC
HCT: 44.3 % (ref 39.0–52.0)
Hemoglobin: 14.5 g/dL (ref 13.0–17.0)
MCH: 30.4 pg (ref 26.0–34.0)
MCHC: 32.7 g/dL (ref 30.0–36.0)
MCV: 92.9 fL (ref 80.0–100.0)
Platelets: 180 K/uL (ref 150–400)
RBC: 4.77 MIL/uL (ref 4.22–5.81)
RDW: 14.1 % (ref 11.5–15.5)
WBC: 7.2 K/uL (ref 4.0–10.5)
nRBC: 0 % (ref 0.0–0.2)

## 2019-09-05 MED ORDER — METOPROLOL SUCCINATE ER 50 MG PO TB24
50.0000 mg | ORAL_TABLET | Freq: Two times a day (BID) | ORAL | Status: DC
  Administered 2019-09-05 – 2019-09-06 (×3): 50 mg via ORAL
  Filled 2019-09-05 (×3): qty 1

## 2019-09-05 MED ORDER — ENSURE ENLIVE PO LIQD
237.0000 mL | Freq: Two times a day (BID) | ORAL | Status: DC
  Administered 2019-09-06 (×2): 237 mL via ORAL

## 2019-09-05 MED ORDER — FUROSEMIDE 10 MG/ML IJ SOLN: 40.0000 mg | Freq: Two times a day (BID) | INTRAMUSCULAR | Status: DC

## 2019-09-05 NOTE — Progress Notes (Signed)
  Echocardiogram 2D Echocardiogram has been performed.  Brian Brown 09/05/2019, 3:14 PM

## 2019-09-05 NOTE — Progress Notes (Signed)
Physical Therapy Treatment Patient Details Name: Brian Brown MRN: [id number] DOB: [birth year] Today's Date: 09/05/2019    History of Present Illness Brian Brown is a 86 y.o. male with medical history significant for CVA w/ R sided weakness, CAD s/p NSTEMI, cardiomyopathy, paroxysmal atrial fibrillation off of Eliquis due to R thalamic ICH-likely due to uncontrolled hypertension, OSA not on CPAP, essential hypertension, hepatitis C who presents with concerns of chest pain and increasing shortness of breath.    PT Comments    Patient progressing slowly towards PT goals. Pt confused this afternoon. Tolerated gait training with Min A and use of RW for safety. 2/4 DOE. Pt with impaired balance and overall safety awareness putting pt at increased risk for falls. Not able to get Sp02 reading as fingers were too cold. Noted to have cognitive deficits relating to attention, command following, awareness and orientation. Pt will need hands on assist for mobility at home for safety. Will follow.    Follow Up Recommendations  Home health PT;Supervision for mobility/OOB;Supervision/Assistance - 24 hour     Equipment Recommendations  None recommended by PT    Recommendations for Other Services       Precautions / Restrictions Precautions Precautions: Fall Restrictions Weight Bearing Restrictions: No    Mobility  Bed Mobility Overal bed mobility: Needs Assistance Bed Mobility: Sit to Supine       Sit to supine: Supervision;HOB elevated   General bed mobility comments: HOB 20 degrees; no assist needed.  Transfers Overall transfer level: Needs assistance Equipment used: Rolling walker (2 wheeled) Transfers: Sit to/from Stand Sit to Stand: Min guard         General transfer comment: Min guard for safety; cues for hand placement. Stood from Youth worker.  Ambulation/Gait Ambulation/Gait assistance: Min assist Gait Distance (Feet): 130 Feet Assistive device: Rolling walker (2  wheeled) Gait Pattern/deviations: Decreased dorsiflexion - right;Shuffle;Decreased stride length;Step-through pattern;Trunk flexed Gait velocity: decreased Gait velocity interpretation: <1.31 ft/sec, indicative of household ambulator General Gait Details: Slow, unsteady gait with RW and Min A for balance esp with turns and obstacle navigation; 2/4 DOE. Not able to get 02 reading, cold fingers. Walked on 3L/min 02 [place].   Stairs             Wheelchair Mobility    Modified Rankin (Stroke Patients Only)       Balance Overall balance assessment: Needs assistance Sitting-balance support: Feet supported;No upper extremity supported Sitting balance-Leahy Scale: Good     Standing balance support: During functional activity Standing balance-Leahy Scale: Poor Standing balance comment: requires UE support in standing and external support.                            Cognition Arousal/Alertness: Awake/alert Behavior During Therapy: WFL for tasks assessed/performed Overall Cognitive Status: Impaired/Different from baseline Area of Impairment: Orientation;Safety/judgement;Problem solving;Memory;Awareness;Following commands                 Orientation Level: Disoriented to;Situation;Time   Memory: Decreased short-term memory Following Commands: Follows one step commands with increased time Safety/Judgement: Decreased awareness of safety;Decreased awareness of deficits Awareness: Intellectual Problem Solving: Requires verbal cues General Comments: Confused this afternoon. Requires repetition to follow commands. Not making sense at times. Thinks he had a stroke and a heart attack.      Exercises      General Comments        Pertinent Vitals/Pain Pain Assessment: No/denies pain    Home  Living                      Prior Function            PT Goals (current goals can now be found in the care plan section) Progress towards PT goals: Progressing  toward goals    Frequency    Min 3X/week      PT Plan Current plan remains appropriate    Co-evaluation              AM-PAC PT "6 Clicks" Mobility   Outcome Measure  Help needed turning from your back to your side while in a flat bed without using bedrails?: None Help needed moving from lying on your back to sitting on the side of a flat bed without using bedrails?: A Little Help needed moving to and from a bed to a chair (including a wheelchair)?: A Little Help needed standing up from a chair using your arms (e.g., wheelchair or bedside chair)?: A Little Help needed to walk in hospital room?: A Little Help needed climbing 3-5 steps with a railing? : A Lot 6 Click Score: 18    End of Session Equipment Utilized During Treatment: Gait belt Activity Tolerance: Patient tolerated treatment well Patient left: in bed;with call bell/phone within reach;with bed alarm set Nurse Communication: Mobility status PT Visit Diagnosis: Other abnormalities of gait and mobility (R26.89);Muscle weakness (generalized) (M62.81)     Time: 2751-7001 PT Time Calculation (min) (ACUTE ONLY): 26 min  Charges:  $Gait Training: 8-22 mins $Therapeutic Activity: 8-22 mins                     Marisa Severin, PT, DPT Acute Rehabilitation Services Pager [phone] Office [place] 09/05/2019, 3:35 PM

## 2019-09-05 NOTE — Progress Notes (Signed)
Pt noted sitting on the side of the bed and stated that he is ready to go home. Patient was assisted to comfortable position in the bed and redressed. Delay explained to patient and he seemed content with explanation.

## 2019-09-05 NOTE — Progress Notes (Signed)
[name], the daughter, called and asked to speak with Dr. Karleen Hampshire. The doctor was then notified via Epic chat and acknowledged the request.

## 2019-09-05 NOTE — Consult Note (Signed)
Consultation Note Date: 09/05/2019   Patient Name: Brian Brown  DOB: [birth year]  MRN: [id number]  Age / Sex: 86 y.o., male  PCP: Seward Carol, MD Referring Physician: Hosie Poisson, MD  Reason for Consultation: Establishing goals of care  HPI/Patient Profile: 86 y.o. male  with past medical history of stroke with right-sided weakness, CAD s/p NSTEMI, CHF EF 21%, cardiomyopathy, paroxysmal atrial fibrillation off Eliquis due to [place], OSA not on CPAP, hypertension, hepatitis C, history of COVID infection Oct 2020 admitted on 08/31/2019 with chest pain, shortness of breath acute on chronic systolic CHF.   Clinical Assessment and Goals of Care: I met today at Brian Brown' bedside. No family present. He is sitting up in recliner. No distress. He does complain about shortness of breath. He continues with confusion and poor insight into his health care diagnosis and status. He gives me permission to reach out to his daughter, Brian Brown. Completed MOST form noted in electronic records.   I called and spoke with daughter, Brian Brown. Brian Brown has just returned to her home out of state to take care of a few things before returning in a couple days. We spent some time discussing his heart failure and the expectations and trajectory. We discussed the symptoms associated with heart failure. We are awaiting results from echocardiogram. Brian Brown's main concern is wanting to ensure that he is optimized and they want to respect his decision to return. They are hopeful that his shortness of breath will improve and he will not need oxygen at time of discharge. His daughters do not live nearby but he does have other family members nearby that help check in on him. We discussed concern with his confusion but Brian Brown reports that this is typical anytime that he is hospitalized and that this improves once he returns home. I encouraged family to arrange  to be with him and have plans in place in case this does not improve as it has previously. They do request to have notice prior to discharge so they can arrange to be back in town to help support him at discharge.   All questions/concerns addressed to best of my ability. Emotional support provided. I discussed with Dr. Karleen Hampshire and that family would like update on results of echocardiogram and update from her and she plans to speak with them later today.   Primary Decision Maker NEXT OF KIN majority of adult children    SUMMARY OF RECOMMENDATIONS   - Full code - Plans for home with home health once medically optimized  Code Status/Advance Care Planning:  Full code   Symptom Management:   Per attending and cardiology.   Palliative Prophylaxis:   Aspiration, Bowel Regimen and Delirium Protocol  Additional Recommendations (Limitations, Scope, Preferences):  Full Scope Treatment  Psycho-social/Spiritual:   Desire for further Chaplaincy support:no  Additional Recommendations: Caregiving  Support/Resources  Prognosis:   Overall prognosis poor with advanced heart failure.   Discharge Planning: Home with Home Health      Primary Diagnoses: Present on Admission: . Acute  systolic heart failure ([place]) . Elevated troponin . PAF (paroxysmal atrial fibrillation) ([place])   I have reviewed the medical record, interviewed the patient and family, and examined the patient. The following aspects are pertinent.  Past Medical History:  Diagnosis Date  . Arthritis    degenerative joint disease  . Bleeding stomach ulcer 25 yrs ago  . CAD (coronary artery disease)    a. 10/2018 NSTEMI/Cath: LM nl, LAD 60p/m, 30d, D1 90, LCX 50ost/p, OM2 90 thrombotic, RCA 30p, 20d-->med rx.  . CKD (chronic kidney disease), stage II   . Hepatitis 1981   does not know what kind;denies jaundice  . HFrEF (heart failure with reduced ejection fraction) ([place])    a. 08/2019 Echo: EF 21%.  Marland Kitchen Hypertension   .  Ischemic cardiomyopathy    a. 02/2018 Echo: EF 60-65%; b. 10/2018 Echo: EF 45-50%; c. 08/2019 Echo: EF 21%, mild LVH. RVSP 41.36mmHg. Mod BAE. Mild MR. Triv AI.  . Mild dilation of ascending aorta ([place])   . PAF (paroxysmal atrial fibrillation) ([place])    a. 10/2018 noted on Linq-->eliquis initiated (CHA2DS2VASc = 7) however d/c'd in 08/2019 2/2 cerebral bleed.  . Prostate cancer [facility]) 2002   prostate, s/p surgery  . Sleep apnea 2005   does not wear a Cpap  . Stroke [facility])    a. 2003 mild residual right leg wkns; b. 03/7626 embolic stroke, later found to have PAF.  Marland Kitchen Wears dentures    full top  . Wears glasses    Social History   Socioeconomic History  . Marital status: Widowed    Spouse name: Not on file  . Number of children: Not on file  . Years of education: Not on file  . Highest education level: Not on file  Occupational History  . Not on file  Tobacco Use  . Smoking status: Former Smoker    Packs/day: 1.00    Years: 30.00    Pack years: 30.00    Types: Cigarettes    Quit date: 01/27/2008    Years since quitting: 11.6  . Smokeless tobacco: Never Used  Vaping Use  . Vaping Use: Never used  Substance and Sexual Activity  . Alcohol use: No  . Drug use: No  . Sexual activity: Not on file  Other Topics Concern  . Not on file  Social History Narrative   Lives in [place] by himself, with his bulldog.  He walks his dog daily.  3 daughters in [place].   Social Determinants of Health   Financial Resource Strain:   . Difficulty of Paying Living Expenses: Not on file  Food Insecurity:   . Worried About Charity fundraiser in the Last Year: Not on file  . Ran Out of Food in the Last Year: Not on file  Transportation Needs:   . Lack of Transportation (Medical): Not on file  . Lack of Transportation (Non-Medical): Not on file  Physical Activity:   . Days of Exercise per Week: Not on file  . Minutes of Exercise per Session: Not on file  Stress:   . Feeling of Stress : Not on file   Social Connections:   . Frequency of Communication with Friends and Family: Not on file  . Frequency of Social Gatherings with Friends and Family: Not on file  . Attends Religious Services: Not on file  . Active Member of Clubs or Organizations: Not on file  . Attends Archivist Meetings: Not on file  . Marital Status:  Not on file   Family History  Problem Relation Age of Onset  . Heart disease Mother   . Heart disease Father    Scheduled Meds: . apixaban  5 mg Oral BID  . atorvastatin  40 mg Oral QHS  . famotidine  20 mg Oral QHS  . furosemide  40 mg Oral Daily  . losartan  25 mg Oral BID  . metoprolol succinate  50 mg Oral BID WC  . sodium chloride flush  3 mL Intravenous Q12H   Continuous Infusions: . sodium chloride     PRN Meds:.sodium chloride, acetaminophen, ondansetron (ZOFRAN) IV, sodium chloride flush No Known Allergies Review of Systems  Constitutional: Positive for activity change, appetite change and fatigue.  Respiratory: Positive for shortness of breath.   Neurological: Positive for weakness.    Physical Exam Vitals and nursing note reviewed.  Constitutional:      General: He is not in acute distress.    Appearance: He is ill-appearing.     Comments: Thin, frail, elderly  Cardiovascular:     Rate and Rhythm: Normal rate.  Pulmonary:     Effort: Pulmonary effort is normal. No tachypnea, accessory muscle usage or respiratory distress.  Abdominal:     General: Abdomen is flat.     Palpations: Abdomen is soft.  Neurological:     Mental Status: He is alert. He is confused.     Vital Signs: BP (!) 154/129 (BP Location: Right Arm)   Pulse 98   Temp 97.8 F (36.6 C) (Oral)   Resp (!) 21   Ht $R'5\' 8"'eY$  (1.727 m)   Wt 71.9 kg Comment: scale b  SpO2 100%   BMI 24.12 kg/m  Pain Scale: 0-10   Pain Score: 0-No pain   SpO2: SpO2: 100 % O2 Device:SpO2: 100 % O2 Flow Rate: .O2 Flow Rate (L/min): 3 L/min  IO: Intake/output summary:    Intake/Output Summary (Last 24 hours) at 09/05/2019 1159 Last data filed at 09/05/2019 1002 Gross per 24 hour  Intake 520 ml  Output 300 ml  Net 220 ml    LBM: Last BM Date: 09/03/19 Baseline Weight: Weight: 77.1 kg Most recent weight: Weight: 71.9 kg (scale b)     Palliative Assessment/Data:     Time In/Out: 1200-1220, 1540-1610 Time Total: 50 min Greater than 50%  of this time was spent counseling and coordinating care related to the above assessment and plan.  Signed by: Vinie Sill, NP Palliative Medicine Team Pager # [phone] (M-F 8a-5p) Team Phone # [phone] (Nights/Weekends)

## 2019-09-05 NOTE — Progress Notes (Signed)
PROGRESS NOTE    Brian Brown  LOV:[id number] DOB: 24-Mar-1932 DOA: 08/31/2019 PCP: Seward Carol, MD    Chief Complaint  Patient presents with  . Fatigue  . 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  He was recently hospitalized for acute on chronic systolic failure, from 08/12/4164 to 08/11/2019.  His left ventricular ejection fraction has decreased 21%.  Hospital course was also complicated by acute mental status change and he was found to have right lateral thalamic intracranial hemorrhage.  At that point his Eliquis and Plavix were held but continue to take aspirin as per neurology.  On arrival to ED he was hypotensive and hypoxic requiring about 2 L of nasal cannula oxygen.  Chest x-ray showed hazy bilateral lung base opacities and cardiomegaly suggestive of fluid overload.  He was started on IV Lasix for diuresis and cardiology consulted for recommendations. Neurology suggest repeating another CT of the head without contrast today and to resume Eliquis. MRI of the brain and CT of the head today shows stable right thalamic intracranial hemorrhage. Cardiology recommends to continue with oral Lasix and that patient is euvolemic. Repeat chest x-ray showed persistent bilateral opacities . PT evaluation recommending home health PT and supervision for mobility.  Patient seen and examined this morning he appears to be confused, slight visual hallucinations.  Assessment & Plan:   Principal Problem:   Acute on chronic systolic CHF (congestive heart failure) (HCC) Active Problems:   PAF (paroxysmal atrial fibrillation) (HCC)   Elevated troponin    Acute systolic heart failure (HCC)   AMS (altered mental status)   Acute respiratory failure with hypoxia (HCC)   Lactic acidosis   History of intracerebral hemorrhage without residual deficit   Persistent atrial fibrillation (HCC)   Coronary artery disease  Acute respiratory failure secondary to acute chronic systolic heart failure Echocardiogram from August 2 shows left ventricular ejection fraction decreased to 21% Persistent bilateral opacities on chest x-ray on repeat chest x-ray.  Underwent diuresis with IV Lasix and transition to oral Lasix yesterday. Continue with strict intake and output and daily weights No urine output calculated today.  So far diuresed about 4.4 L since admission. Cardiology consulted and recommended to continue with oral Lasix as patient is euvolemic at this time. Continue with beta-blocker and losartan.    Persistent atrial fibrillation Patient's CHA2DS2-VASc score is about 6 Eliquis started on 09/03/2019.  Was previously held due to intracranial hemorrhage. Repeat CT of the head without contrast today shows stable intracranial hemorrhage.    History of CVA in 2020 Chronic right thalamic hemorrhage seen on 08/06/2019 likely secondary to uncontrolled hypertension.  Continue with the Lipitor Repeat CT of the head without contrast today showed stable intracranial hemorrhage. Recommend follow-up with neurology as outpatient.    Hypokalemia Replaced.   Elevated troponin probably secondary to demand ischemia from CHF. Patient denies any anginal symptoms.     History of COVID-19 infection in October 2020 Patient is fully vaccinated and he is afebrile without any evidence of sick stomach infection    Acute metabolic encephalopathy  patient continues to have intermittent confusion .  CT/MRI of the brain does not show any acute findings at this time.     Essential hypertension Maintain  blood pressure less than 140/90 Continue with metoprolol  and losartan.       DVT prophylaxis: Eliquis Code Status: Full code Family Communication: Discussed with daughter over the phone Disposition:   Status is: Inpatient  Remains inpatient appropriate because:Ongoing diagnostic testing needed not appropriate for outpatient work up and Inpatient level of care appropriate due to severity of illness   Dispo:  Patient From: Home  Planned Disposition: Home with Health Care Svc  Expected discharge date: 09/06/2019  Medically stable for discharge: No        Consultants:   Cardiology  Procedures: Echocardiogram Antimicrobials: None  Subjective: Pt is confused, wants to know when he can go home.   Objective: Vitals:   09/04/19 1707 09/04/19 2004 09/05/19 0100 09/05/19 0453  BP: (!) 142/112 126/89  125/89  Pulse: 89 77  91  Resp: 17 18  18   Temp:  98.1 F (36.7 C)  97.8 F (36.6 C)  TempSrc:  Oral  Oral  SpO2:  99%  98%  Weight:   71.9 kg   Height:        Intake/Output Summary (Last 24 hours) at 09/05/2019 1034 Last data filed at 09/05/2019 1002 Gross per 24 hour  Intake 520 ml  Output 300 ml  Net 220 ml   Filed Weights   09/03/19 0448 09/04/19 0135 09/05/19 0100  Weight: 71.3 kg 71.8 kg 71.9 kg    Examination:  General exam: Appears calm and comfortable  Respiratory system: Diminished air entry at bases, no wheezing or rhonchi on nasal cannula oxygen Cardiovascular system: S1 & S2 heard,irregular. No JVD,  No pedal edema. Gastrointestinal system: Abdomen is nondistended, soft and nontender.. Normal bowel sounds heard. Central nervous system: Alert and oriented to person only, appears confused but following commands Extremities: No cyanosis or clubbing Skin: No rashes, lesions or ulcers Psychiatry: Patient is confused appears to have some visual hallucinations but no agitation at this time.    Data Reviewed: I have personally reviewed following labs and imaging studies  CBC: Recent Labs  Lab  08/31/19 0809 09/04/19 0453 09/05/19 0739  WBC 6.9 6.5 7.2  HGB 13.7 12.6* 14.5  HCT 42.5 38.8* 44.3  MCV 93.4 92.2 92.9  PLT 187 167 989    Basic Metabolic Panel: Recent Labs  Lab 09/01/19 0405 09/02/19 0245 09/03/19 0453 09/04/19 0453 09/05/19 0739  NA 140 138 140 138 139  K 3.3* 3.3* 3.6 3.8 4.9  CL 102 102 99 100 102  CO2 28 25 32 30 26  GLUCOSE 107* 93 96 103* 110*  BUN 22 21 18  24* 27*  CREATININE 1.32* 1.04 1.35* 1.35* 1.40*  CALCIUM 9.0 8.6* 8.8* 8.9 9.1  MG  --   --  1.7  --   --     GFR: Estimated Creatinine Clearance: 37.3 mL/min (A) (by C-G formula based on SCr of 1.4 mg/dL (H)).  Liver Function Tests: Recent Labs  Lab 08/31/19 1833  AST 33  ALT 41  ALKPHOS 103  BILITOT 2.1*  PROT 7.6  ALBUMIN 3.5    CBG: No results for input(s): GLUCAP in the last 168 hours.   Recent Results (from the past 240 hour(s))  SARS Coronavirus 2 by RT PCR (hospital order, performed in [facility] hospital lab) Nasopharyngeal Nasopharyngeal Swab     Status: None   Collection Time: 08/31/19  7:13 PM   Specimen: Nasopharyngeal Swab  Result Value Ref Range Status   SARS Coronavirus 2 NEGATIVE NEGATIVE Final  Comment: (NOTE) SARS-CoV-2 target nucleic acids are NOT DETECTED.  The SARS-CoV-2 RNA is generally detectable in upper and lower respiratory specimens during the acute phase of infection. The lowest concentration of SARS-CoV-2 viral copies this assay can detect is 250 copies / mL. A negative result does not preclude SARS-CoV-2 infection and should not be used as the sole basis for treatment or other patient management decisions.  A negative result may occur with improper specimen collection / handling, submission of specimen other than nasopharyngeal swab, presence of viral mutation(s) within the areas targeted by this assay, and inadequate number of viral copies (<250 copies / mL). A negative result must be combined with clinical observations, patient  history, and epidemiological information.  Fact Sheet for Patients:   StrictlyIdeas.no  Fact Sheet for Healthcare Providers: BankingDealers.co.za  This test is not yet approved or  cleared by the [place] FDA and has been authorized for detection and/or diagnosis of SARS-CoV-2 by FDA under an Emergency Use Authorization (EUA).  This EUA will remain in effect (meaning this test can be used) for the duration of the COVID-19 declaration under Section 564(b)(1) of the Act, 21 U.S.C. section 360bbb-3(b)(1), unless the authorization is terminated or revoked sooner.  Performed at [facility], [place] [address], [place], [place] [address]          Radiology Studies: CT HEAD WO CONTRAST  Result Date: 09/05/2019 CLINICAL DATA:  Stroke, follow-up; ICH follow-up. Obtained CT head without contrast 2 days post restart of Eliquis. EXAM: CT HEAD WITHOUT CONTRAST TECHNIQUE: Contiguous axial images were obtained from the base of the skull through the vertex without intravenous contrast. COMPARISON:  Brain MRI 09/01/2019, head CT 08/31/2019 FINDINGS: Brain: Stable, moderate generalized parenchymal atrophy. An 8 mm right thalamic hemorrhage unchanged in size as compared to the head CT of 08/31/2019. No evidence of interval intracranial hemorrhage. Redemonstrated chronic cortically based infarcts within the bilateral parietal and right occipital lobes. Redemonstrated chronic lacunar infarcts within the left corona radiata and right thalamus. Stable background moderate ill-defined hypoattenuation within the cerebral white matter which is nonspecific, but consistent with chronic small vessel ischemic disease. No extra-axial fluid collection. No midline shift. Vascular: No hyperdense vessel.  Atherosclerotic calcifications. Skull: Normal. Negative for fracture or focal lesion. Sinuses/Orbits: Visualized orbits show no acute finding. Mild paranasal sinus  mucosal thickening, most notably ethmoidal. No significant mastoid effusion. IMPRESSION: An 8 mm right thalamic parenchymal hemorrhage is unchanged in size as compared to the head CT of 08/31/2019. No evidence of interval intracranial hemorrhage. Stable generalized parenchymal atrophy and chronic ischemic changes with chronic infarcts as described. Mild paranasal sinus mucosal thickening. Electronically Signed   By: Kellie Simmering DO   On: 09/05/2019 09:57        Scheduled Meds: . apixaban  5 mg Oral BID  . atorvastatin  40 mg Oral QHS  . famotidine  20 mg Oral QHS  . furosemide  40 mg Oral Daily  . losartan  25 mg Oral BID  . metoprolol succinate  50 mg Oral BID WC  . sodium chloride flush  3 mL Intravenous Q12H   Continuous Infusions: . sodium chloride       LOS: 5 days        Hosie Poisson, MD [facility]   To contact the attending provider between 7A-7P or the covering provider during after hours 7P-7A, please log into the web site [email or web address] and access using universal [place] password for that web site. If you  do not have the password, please call the hospital operator.  09/05/2019, 10:34 AM

## 2019-09-05 NOTE — Progress Notes (Signed)
Progress Note  Patient Name: Brian Brown Date of Encounter: 09/05/2019  Primary Cardiologist:   Larae Grooms, MD   Subjective   Baseline confusion this morning.  Non focal. Denies pain or SOB.  Traveling to head CT this morning.   Inpatient Medications    Scheduled Meds: . apixaban  5 mg Oral BID  . atorvastatin  40 mg Oral QHS  . famotidine  20 mg Oral QHS  . furosemide  40 mg Oral Daily  . losartan  25 mg Oral BID  . metoprolol succinate  25 mg Oral BID WC  . sodium chloride flush  3 mL Intravenous Q12H   Continuous Infusions: . sodium chloride     PRN Meds: sodium chloride, acetaminophen, ondansetron (ZOFRAN) IV, sodium chloride flush   Vital Signs    Vitals:   09/04/19 1707 09/04/19 2004 09/05/19 0100 09/05/19 0453  BP: (!) 142/112 126/89  125/89  Pulse: 89 77  91  Resp: 17 18  18   Temp:  98.1 F (36.7 C)  97.8 F (36.6 C)  TempSrc:  Oral  Oral  SpO2:  99%  98%  Weight:   71.9 kg   Height:        Intake/Output Summary (Last 24 hours) at 09/05/2019 0839 Last data filed at 09/05/2019 0100 Gross per 24 hour  Intake 700 ml  Output 300 ml  Net 400 ml   Filed Weights   09/03/19 0448 09/04/19 0135 09/05/19 0100  Weight: 71.3 kg 71.8 kg 71.9 kg    Telemetry    Atrial fib with RVR.  - Personally Reviewed  ECG    NA - Personally Reviewed  Physical Exam   GEN: No  acute distress.   Neck: No  JVD Cardiac: Irregular RR, no murmurs, rubs, or gallops.  Respiratory: Clear   to auscultation bilaterally. GI: Soft, nontender, non-distended, normal bowel sounds  MS:  No edema; No deformity. Neuro:   Nonfocal  Psych: Oriented and appropriate    Labs    Chemistry Recent Labs  Lab 08/31/19 1833 09/01/19 0405 09/02/19 0245 09/03/19 0453 09/04/19 0453  NA  --    < > 138 140 138  K  --    < > 3.3* 3.6 3.8  CL  --    < > 102 99 100  CO2  --    < > 25 32 30  GLUCOSE  --    < > 93 96 103*  BUN  --    < > 21 18 24*  CREATININE  --    < >  1.04 1.35* 1.35*  CALCIUM  --    < > 8.6* 8.8* 8.9  PROT 7.6  --   --   --   --   ALBUMIN 3.5  --   --   --   --   AST 33  --   --   --   --   ALT 41  --   --   --   --   ALKPHOS 103  --   --   --   --   BILITOT 2.1*  --   --   --   --   GFRNONAA  --    < > >60 48* 48*  GFRAA  --    < > >60 55* 55*  ANIONGAP  --    < > 11 9 8    < > = values in this interval not displayed.  Hematology Recent Labs  Lab 08/31/19 0809 09/04/19 0453 09/05/19 0739  WBC 6.9 6.5 7.2  RBC 4.55 4.21* 4.77  HGB 13.7 12.6* 14.5  HCT 42.5 38.8* 44.3  MCV 93.4 92.2 92.9  MCH 30.1 29.9 30.4  MCHC 32.2 32.5 32.7  RDW 14.5 14.1 14.1  PLT 187 167 180    Cardiac EnzymesNo results for input(s): TROPONINI in the last 168 hours. No results for input(s): TROPIPOC in the last 168 hours.   BNP Recent Labs  Lab 08/31/19 1833  BNP 1,344.2*     DDimer No results for input(s): DDIMER in the last 168 hours.   Radiology    No results found.  Cardiac Studies   Echocardiogram 08/07/2019 1. Left ventricular ejection fraction by 3D volume is 21 %. The left  ventricle has severely decreased function. The left ventricle demonstrates  regional wall motion abnormalities (see scoring diagram/findings for  description). There is mild left  ventricular hypertrophy. Left ventricular diastolic parameters are  indeterminate.  2. Right ventricular systolic function is moderately reduced. The right  ventricular size is normal. There is mildly elevated pulmonary artery  systolic pressure. The estimated right ventricular systolic pressure is  35.4 mmHg.  3. Left atrial size was moderately dilated.  4. Right atrial size was moderately dilated.  5. The mitral valve is degenerative. Mild mitral valve regurgitation. No  evidence of mitral stenosis.  6. The aortic valve is abnormal. Aortic valve regurgitation is trivial.  No aortic stenosis is present.  7. The inferior vena cava is dilated in size with <50%  respiratory  variability, suggesting right atrial pressure of 15 mmHg.   Patient Profile     86 y.o. male with a history of CAD s/p NSTEMI 10/2018, CVA x 2 (last 02/2018), PAF, HTN, HL, ICM/HFrEF (EF 21% 08/2019), CKD II, and R thalamic bleed (08/2019) prompting discontinuationof Eliquis and Plavix therapy, who is being seen for the evaluation ofacute on chronic systolic heart failure and persistent atrial fibrillation  Assessment & Plan    Acute on chronic systolic and diastolic HF:    Echo ordered to reassess EF as this was possibly a stress induced cardiomyopathy.  Cozaar increased yesterday.  I/O incomplete.  He seems to be euvolemic.   On oral Lasix.  OK to continue and follow creat closely.    CAD:  Medical management. No active ischemic symptoms.   Atrial fib:  Restarted Eliquis.  See the discussion over the weekend per Dr. Sallyanne Kuster.    Rate is up.  I am going to increase the beta blocker slightly.   HTN:   BPs controlled in the context of treating the CHF.    Dyslipidemia: LDL at target.  Continue current therapy.    CKD II: Creat is up very slightly.  Follow closely.     For questions or updates, please contact [place] Please consult [email or web address] for contact info under Cardiology/STEMI.   Signed, Minus Breeding, MD  09/05/2019, 8:39 AM

## 2019-09-06 ENCOUNTER — Telehealth: Payer: Self-pay | Admitting: Interventional Cardiology

## 2019-09-06 DIAGNOSIS — Z7189 Other specified counseling: Secondary | ICD-10-CM

## 2019-09-06 DIAGNOSIS — Z515 Encounter for palliative care: Secondary | ICD-10-CM

## 2019-09-06 DIAGNOSIS — F0390 Unspecified dementia without behavioral disturbance: Secondary | ICD-10-CM

## 2019-09-06 LAB — CBC
HCT: 45.7 % (ref 39.0–52.0)
Hemoglobin: 15.2 g/dL (ref 13.0–17.0)
MCH: 30.4 pg (ref 26.0–34.0)
MCHC: 33.3 g/dL (ref 30.0–36.0)
MCV: 91.4 fL (ref 80.0–100.0)
Platelets: 189 10*3/uL (ref 150–400)
RBC: 5 MIL/uL (ref 4.22–5.81)
RDW: 14.1 % (ref 11.5–15.5)
WBC: 7.8 10*3/uL (ref 4.0–10.5)
nRBC: 0 % (ref 0.0–0.2)

## 2019-09-06 LAB — MAGNESIUM: Magnesium: 1.9 mg/dL (ref 1.7–2.4)

## 2019-09-06 MED ORDER — MAGNESIUM OXIDE 400 (241.3 MG) MG PO TABS
400.0000 mg | ORAL_TABLET | Freq: Two times a day (BID) | ORAL | Status: AC
  Administered 2019-09-06 (×2): 400 mg via ORAL
  Filled 2019-09-06 (×2): qty 1

## 2019-09-06 MED ORDER — FUROSEMIDE 40 MG PO TABS
40.0000 mg | ORAL_TABLET | Freq: Two times a day (BID) | ORAL | Status: DC
  Administered 2019-09-06 (×2): 40 mg via ORAL
  Filled 2019-09-06 (×2): qty 1

## 2019-09-06 MED ORDER — SACUBITRIL-VALSARTAN 24-26 MG PO TABS: 1.0000 | ORAL_TABLET | Freq: Two times a day (BID) | ORAL | 0 refills | Status: DC

## 2019-09-06 MED ORDER — SACUBITRIL-VALSARTAN 24-26 MG PO TABS
1.0000 | ORAL_TABLET | Freq: Two times a day (BID) | ORAL | Status: DC
  Administered 2019-09-06 (×2): 1 via ORAL
  Filled 2019-09-06 (×2): qty 1

## 2019-09-06 MED ORDER — APIXABAN 5 MG PO TABS
5.0000 mg | ORAL_TABLET | Freq: Two times a day (BID) | ORAL | 0 refills | Status: DC
Start: 2019-09-06 — End: 2019-12-08

## 2019-09-06 MED ORDER — FUROSEMIDE 40 MG PO TABS
40.0000 mg | ORAL_TABLET | Freq: Two times a day (BID) | ORAL | 0 refills | Status: DC
Start: 2019-09-06 — End: 2019-09-12

## 2019-09-06 MED FILL — ENTRESTO 24 MG-26 MG TABLET: 24-26 | 30 days supply | Qty: 60 | Fill #0

## 2019-09-06 MED FILL — FUROSEMIDE 40 MG TABLET: 40 | 15 days supply | Qty: 30 | Fill #0

## 2019-09-06 NOTE — TOC Transition Note (Addendum)
Transition of Care [facility]) - CM/SW Discharge Note   Patient Details  Name: Brian Brown MRN: [id number] Date of Birth: 1932-06-08  [facility]) CM/SW Contact:  Zenon Mayo, RN Phone Number: 09/06/2019, 11:02 AM   Clinical Narrative:    Patient is from home alone, he is active with [facility] for [place].  NCM spoke with patient and daughter on 8/30, they would like to continue with [facility].  MD added HHOT to [facility] services.  SOC will begin 24 to 48 hrs post dc.  He has a walker at home. Daughter will transport him home at dc.  He has no issues with getting medications.  Daughter states he has someone that will be checking on him at all times.      Final next level of care: [place] Barriers to Discharge: No Barriers Identified   Patient Goals and CMS Choice Patient states their goals for this hospitalization and ongoing recovery are:: get better CMS Medicare.gov Compare Post Acute Care list provided to:: Patient Represenative (must comment) Choice offered to / list presented to : Adult Children  Discharge Placement                       Discharge Plan and Services In-house Referral: NA Discharge Planning Services: CM Consult Post Acute Care Choice: Home Health, Resumption of Svcs/PTA Provider            DME Agency: NA       HH Arranged: PT, OT [place] Agency: [facility] [employer]) Date [place]: 09/04/19 Time [place]: 1603 Representative spoke with at [place]: [place] ([place]) Interventions     Readmission Risk Interventions Readmission Risk Prevention Plan 09/04/2019  Transportation Screening Complete  PCP or Specialist Appt within 3-5 Days Complete  HRI or [place] Complete  Social Work Consult for [place] Planning/Counseling [place] Not Applicable  Medication Review Press photographer) Complete  Some recent data  might be hidden

## 2019-09-06 NOTE — TOC Progression Note (Addendum)
Transition of Care [facility]) - Progression Note    Patient Details  Name: Brian Brown MRN: [id number] Date of Birth: [birth year]  Transition of Care [facility]) CM/SW Contact  Brian Mayo, RN Phone Number: 09/06/2019, 1:33 PM  Clinical Narrative:    NCM spoke with daughter, Brian Brown, she states Dr. Karleen [place] told her that we would not dc the patient until he has 24 hr care at home.  NCM informed her that spoke with her the other day and she stated he will have someone checking on him at all times. Daughter then stated that she is not sure if she can get someone to stay with him.  NCM informed her that he is walking 200 feet.  He has HHPT, HHOT set up for him.  NCM informed her that she can look into private duty care for him. To go to Medicare.gov to see the list of private agencies.  She will have to pay out of her pocket for this.  She states she will call to let us know what time someone will be picking him up.   9/1  1337- Brian Brown has called back to speak with Raquel Sarna RN and asked if the dc can be put off til tomorrow when she can have someone be there with him.  Raquel Sarna will inform MD.  Family is not available to pick up patient today. Expected Discharge Plan: [address] Barriers to Discharge: No Barriers Identified  Expected Discharge Plan and Services Expected Discharge Plan: [place] In-house Referral: NA Discharge Planning Services: CM Consult Post Acute Care Choice: Home Health, Resumption of Svcs/PTA Provider Living arrangements for the past 2 months: Single Family Home Expected Discharge Date: 09/06/19                 DME Agency: NA       HH Arranged: PT, OT HH Agency: [facility] [employer]) Date [place]: 09/04/19 Time [place]: 1603 Representative spoke with at [place]: [place] ([place]) Interventions    Readmission Risk Interventions Readmission Risk  Prevention Plan 09/04/2019  Transportation Screening Complete  PCP or Specialist Appt within 3-5 Days Complete  HRI or [place] Complete  Social Work Consult for [place] Planning/Counseling [place] Not Applicable  Medication Review Press photographer) Complete  Some recent data might be hidden

## 2019-09-06 NOTE — Progress Notes (Addendum)
Physical Therapy Treatment Patient Details Name: Brian Brown MRN: [id number] DOB: 1932-10-23 Today's Date: 09/06/2019    History of Present Illness Brian Brown is a 86 y.o. male with medical history significant for CVA w/ R sided weakness, CAD s/p NSTEMI, cardiomyopathy, paroxysmal atrial fibrillation off of Eliquis due to R thalamic ICH-likely due to uncontrolled hypertension, OSA not on CPAP, essential hypertension, hepatitis C who presents with concerns of chest pain and increasing shortness of breath.    PT Comments    Pt admitted with above diagnosis. Pt was able to ambulate in hallway still needing cues and assist for safety with RW.  Pt intermittently confused.  Pt with decr safety due to confusion. Could not get the O2 saturation to pick up even on Dynamap due to pt cold fingers therefore unsure of desaturation.  MD updated regarding pts mobility and that pt needs SNF if he doesn't have 24 hour care.  Also spoke with CM who states that family is going to hire some private duty sitters so that pt can stay home.  HHPT arranged.   Pt currently with functional limitations due to balance and endurance deficits. Pt will benefit from skilled PT to increase their independence and safety with mobility to allow discharge to the venue listed below.     Follow Up Recommendations  Home health PT;Supervision/Assistance - 24 hour     Equipment Recommendations  None recommended by PT    Recommendations for Other Services       Precautions / Restrictions Precautions Precautions: Fall Precaution Comments: pt reports one fall in last 3 months Restrictions Weight Bearing Restrictions: No    Mobility  Bed Mobility               General bed mobility comments: in chair  Transfers Overall transfer level: Needs assistance Equipment used: Rolling walker (2 wheeled) Transfers: Sit to/from Stand Sit to Stand: Min guard Stand pivot transfers: Min guard       General transfer  comment: Min guard for safety; cues for hand placement  Ambulation/Gait Ambulation/Gait assistance: Min assist Gait Distance (Feet): 200 Feet Assistive device: Rolling walker (2 wheeled) Gait Pattern/deviations: Decreased dorsiflexion - right;Shuffle;Decreased stride length;Step-through pattern;Trunk flexed Gait velocity: decreased Gait velocity interpretation: <1.31 ft/sec, indicative of household ambulator General Gait Details: Slow, unsteady gait with RW and Min A for balance esp with turns and obstacle navigation; 2/4 DOE. Not able to get 02 reading, cold fingers. Walked without O2 and replaced O2 at 2L/min 02 [place] since O2 wouldnt pick up and DOE 2/4 .  Pt got upset in hallway when PT was cuing him for safety and had mobility tech assist with lines.    Stairs             Wheelchair Mobility    Modified Rankin (Stroke Patients Only) Modified Rankin (Stroke Patients Only) Pre-Morbid Rankin Score: No symptoms Modified Rankin: Moderately severe disability     Balance Overall balance assessment: Needs assistance Sitting-balance support: Feet supported;No upper extremity supported Sitting balance-Leahy Scale: Good     Standing balance support: During functional activity Standing balance-Leahy Scale: Poor Standing balance comment: requires UE support in standing and external support.                            Cognition Arousal/Alertness: Awake/alert Behavior During Therapy: WFL for tasks assessed/performed Overall Cognitive Status: Impaired/Different from baseline Area of Impairment: Orientation;Safety/judgement;Problem solving;Memory;Awareness;Following commands  Orientation Level: Disoriented to;Situation;Time   Memory: Decreased short-term memory Following Commands: Follows one step commands with increased time Safety/Judgement: Decreased awareness of safety;Decreased awareness of deficits Awareness: Intellectual Problem Solving:  Requires verbal cues General Comments: Confused. Requires repetition to follow commands. Not making sense at times.      Exercises General Exercises - Lower Extremity Long Arc Quad: AROM;Both;10 reps;Seated    General Comments General comments (skin integrity, edema, etc.): VSS      Pertinent Vitals/Pain Pain Assessment: No/denies pain    Home Living                      Prior Function            PT Goals (current goals can now be found in the care plan section) Acute Rehab PT Goals Patient Stated Goal: go home Progress towards PT goals: Progressing toward goals    Frequency    Min 3X/week      PT Plan      Co-evaluation              AM-PAC PT "6 Clicks" Mobility   Outcome Measure  Help needed turning from your back to your side while in a flat bed without using bedrails?: None Help needed moving from lying on your back to sitting on the side of a flat bed without using bedrails?: A Little Help needed moving to and from a bed to a chair (including a wheelchair)?: A Little Help needed standing up from a chair using your arms (e.g., wheelchair or bedside chair)?: A Little Help needed to walk in hospital room?: A Little Help needed climbing 3-5 steps with a railing? : A Lot 6 Click Score: 18    End of Session Equipment Utilized During Treatment: Gait belt;Oxygen Activity Tolerance: Patient tolerated treatment well Patient left: with call bell/phone within reach;in chair;with chair alarm set Nurse Communication: Mobility status PT Visit Diagnosis: Other abnormalities of gait and mobility (R26.89);Muscle weakness (generalized) (M62.81)     Time: 5784-6962 PT Time Calculation (min) (ACUTE ONLY): 27 min  Charges:  $Gait Training: 23-37 mins                     Maude Hettich W,PT [place] Pager:  [phone]  Office:  [place] 09/06/2019, 1:59 PM

## 2019-09-06 NOTE — Progress Notes (Addendum)
Walking saturations recheck with probe on forehead as it is difficult for oxygen saturations to read on monitor. Patient ambulated to the room door on RA and maintained in the low 90's. MD made aware.

## 2019-09-06 NOTE — Progress Notes (Signed)
Palliative:  HPI: 86 y.o. male  with past medical history of stroke with right-sided weakness, CAD s/p NSTEMI, CHF EF 21%, cardiomyopathy, paroxysmal atrial fibrillation off Eliquis due to [place], OSA not on CPAP, hypertension, hepatitis C, history of COVID infection Oct 2020 admitted on 08/31/2019 with chest pain, shortness of breath acute on chronic systolic CHF.   I reached out to daughter, Brian Brown, after visiting with Brian Brown. Brian Brown is resting comfortably in bed this morning and has eaten most of his breakfast. He has no complaints. Denies pain. Tells me that his breathing still doesn't feel normal. He is still requiring oxygen. I updated Brian Brown on my assessment this morning. She reports that she did speak with Dr. Karleen Hampshire yesterday and she explained results from echocardiogram. We discussed heart failure and expected trajectory that this will gradually worsen leading to further fluid accumulation (with shortness of breath and swelling) and fatigue and weakness as well. We discussed that he will need constant monitoring as this can worsen at any time and the Lasix dose that works today may not be good enough for him next week or next month.   We discussed concern for his overall health with weak heart especially. I discussed with Brian Brown the need for further conversations regarding her father's wishes. She reports that he has told them that he wants to be resuscitated but would not desire to be left on life support measures "as a vegetable" or long term. I expressed understanding and I also encourage them to continue to speak as a family about these decisions now that we know he has a condition that will continue to cause worsening and if he were to survive a resuscitative event he would be in very poor health with poor quality of life. I explained that sometimes as people's health changes and their quality of life declines they often change their minds about what they are willing to go through based on the  quality of life we would be bringing them back to. Brian Brown was receptive and understanding. No changes to goals of care today.   Update: I received a call from Brian Brown who is upset as she was informed that her father is being discharged today. She is upset because she says her discussion with the medical team yesterday indicated that he would not be discharged immediately and that family would have some time to return to town to care for him at home. They were also hopeful that he would not need oxygen at discharge. She inquires about possibility of rehab and I did explain that he has done very well with PT and there is a possibility that insurance may not approve a rehab stay for him. Unfortunately these decisions are not up to me. I reassured her that the cardiologist and hospitalist are working together to do what is best for him and feel that his volume status is stable to be managed outpatient with close cardiology follow up. I updated Dr. Wynelle Cleveland and Neoma Laming, [facility] to work further with daughter on options for rehab vs home and what we can do to assist them in this transition.   All questions/concerns addressed to the best of my ability. Emotional support provided.   Exam: Alert, oriented x 2, confused. Follows commands. Moves all extremities. Breathing regular, unlabored on 3L nasal cannula. Warm to touch. VSS.   Plan: - Recommend outpatient palliative care to follow at home.  - Recommend ongoing goals of care discussions.   25 min  Vinie Sill, NP  Palliative Medicine Team Pager [phone] (Please see [email or web address] for schedule) Team Phone [phone]    Greater than 50%  of this time was spent counseling and coordinating care related to the above assessment and plan

## 2019-09-06 NOTE — Telephone Encounter (Signed)
Dr Huston Foley [address]  On 09/08/19 at 10:20 .Brian Brown

## 2019-09-06 NOTE — Telephone Encounter (Signed)
CSW referred to assist with transportation to [facility] office on 09-08-19 at 10:20pm. CSW contacted daughter [name] to discuss needs as patient still hospitalized at the moment. Daughter reports patient does not need transport as she will provide transport. CSW provided contact information for future need. Daughter appreciative of call stating she is a bit overwhelmed with multiple calls today regarding patient. CSW offered support and will continue to be available as needed. Raquel Sarna, [place], [place]

## 2019-09-06 NOTE — Telephone Encounter (Signed)
     Pt c/o medication issue:  1. Name of Medication:   sacubitril-valsartan (ENTRESTO) 24-26 MG    2. How are you currently taking this medication (dosage and times per day)? Take 1 tablet by mouth 2 (two) times daily.  3. Are you having a reaction (difficulty breathing--STAT)?   4. What is your medication issue? PA Almyra Deforest called, he said pt is on entresto now and pharmacy need prior auth.  Also, he asked if we can arrange transportation for patient since he live alone. Pt has appt on 09/03 with Dr. Irish Lack

## 2019-09-06 NOTE — Progress Notes (Signed)
Progress Note  Patient Name: Brian Brown Date of Encounter: 09/06/2019  Primary Cardiologist:   Larae Grooms, MD   Subjective   Some confusion.  Answers questions and denies chest pain and SOB.    Inpatient Medications    Scheduled Meds: . apixaban  5 mg Oral BID  . atorvastatin  40 mg Oral QHS  . famotidine  20 mg Oral QHS  . feeding supplement (ENSURE ENLIVE)  237 mL Oral BID BM  . furosemide  40 mg Oral BID  . losartan  25 mg Oral BID  . magnesium oxide  400 mg Oral BID  . metoprolol succinate  50 mg Oral BID WC  . sodium chloride flush  3 mL Intravenous Q12H   Continuous Infusions: . sodium chloride     PRN Meds: sodium chloride, acetaminophen, ondansetron (ZOFRAN) IV, sodium chloride flush   Vital Signs    Vitals:   09/05/19 1049 09/05/19 1936 09/06/19 0447 09/06/19 0830  BP: (!) 154/129 (!) 128/108 (!) 133/97 (!) 122/94  Pulse: 98 98 89 86  Resp: (!) 21 18 18 20   Temp:  98.2 F (36.8 C) 98.7 F (37.1 C) 98.7 F (37.1 C)  TempSrc:  Oral Oral Oral  SpO2: 100% 100% 100% 100%  Weight:   70.8 kg   Height:        Intake/Output Summary (Last 24 hours) at 09/06/2019 0841 Last data filed at 09/06/2019 0100 Gross per 24 hour  Intake 120 ml  Output 300 ml  Net -180 ml   Filed Weights   09/04/19 0135 09/05/19 0100 09/06/19 0447  Weight: 71.8 kg 71.9 kg 70.8 kg    Telemetry    Atrial fib with controlled ventricular rate.  - Personally Reviewed  ECG    NA - Personally Reviewed  Physical Exam   GEN: No  acute distress.   Neck: No  JVD Cardiac: Irregular RR, no murmurs, rubs, or gallops.  Respiratory: Clear  to auscultation bilaterally. GI: Soft, nontender, non-distended, normal bowel sounds  MS:  No edema; No deformity. Neuro:   Nonfocal  Psych:   Mildly confused.    Labs    Chemistry Recent Labs  Lab 08/31/19 1833 09/01/19 0405 09/03/19 0453 09/04/19 0453 09/05/19 0739  NA  --    < > 140 138 139  K  --    < > 3.6 3.8 4.9  CL   --    < > 99 100 102  CO2  --    < > 32 30 26  GLUCOSE  --    < > 96 103* 110*  BUN  --    < > 18 24* 27*  CREATININE  --    < > 1.35* 1.35* 1.40*  CALCIUM  --    < > 8.8* 8.9 9.1  PROT 7.6  --   --   --   --   ALBUMIN 3.5  --   --   --   --   AST 33  --   --   --   --   ALT 41  --   --   --   --   ALKPHOS 103  --   --   --   --   BILITOT 2.1*  --   --   --   --   GFRNONAA  --    < > 48* 48* 45*  GFRAA  --    < > 55* 55* 53*  ANIONGAP  --    < >  9 8 11    < > = values in this interval not displayed.     Hematology Recent Labs  Lab 09/04/19 0453 09/05/19 0739 09/06/19 0758  WBC 6.5 7.2 7.8  RBC 4.21* 4.77 5.00  HGB 12.6* 14.5 15.2  HCT 38.8* 44.3 45.7  MCV 92.2 92.9 91.4  MCH 29.9 30.4 30.4  MCHC 32.5 32.7 33.3  RDW 14.1 14.1 14.1  PLT 167 180 189    Cardiac EnzymesNo results for input(s): TROPONINI in the last 168 hours. No results for input(s): TROPIPOC in the last 168 hours.   BNP Recent Labs  Lab 08/31/19 1833  BNP 1,344.2*     DDimer No results for input(s): DDIMER in the last 168 hours.   Radiology    DG Chest 1 View  Result Date: 09/05/2019 CLINICAL DATA:  Labored breathing. EXAM: CHEST  1 VIEW COMPARISON:  August 31, 2019. FINDINGS: Stable cardiomegaly. No pneumothorax is noted. Increased bibasilar opacities are noted concerning for worsening pulmonary edema with associated pleural effusions. Bony thorax is unremarkable. IMPRESSION: Increased bibasilar opacities are noted concerning for worsening pulmonary edema with associated pleural effusions. Aortic Atherosclerosis (ICD10-I70.0). Electronically Signed   By: Marijo Conception M.D.   On: 09/05/2019 12:54   CT HEAD WO CONTRAST  Result Date: 09/05/2019 CLINICAL DATA:  Stroke, follow-up; ICH follow-up. Obtained CT head without contrast 2 days post restart of Eliquis. EXAM: CT HEAD WITHOUT CONTRAST TECHNIQUE: Contiguous axial images were obtained from the base of the skull through the vertex without intravenous  contrast. COMPARISON:  Brain MRI 09/01/2019, head CT 08/31/2019 FINDINGS: Brain: Stable, moderate generalized parenchymal atrophy. An 8 mm right thalamic hemorrhage unchanged in size as compared to the head CT of 08/31/2019. No evidence of interval intracranial hemorrhage. Redemonstrated chronic cortically based infarcts within the bilateral parietal and right occipital lobes. Redemonstrated chronic lacunar infarcts within the left corona radiata and right thalamus. Stable background moderate ill-defined hypoattenuation within the cerebral white matter which is nonspecific, but consistent with chronic small vessel ischemic disease. No extra-axial fluid collection. No midline shift. Vascular: No hyperdense vessel.  Atherosclerotic calcifications. Skull: Normal. Negative for fracture or focal lesion. Sinuses/Orbits: Visualized orbits show no acute finding. Mild paranasal sinus mucosal thickening, most notably ethmoidal. No significant mastoid effusion. IMPRESSION: An 8 mm right thalamic parenchymal hemorrhage is unchanged in size as compared to the head CT of 08/31/2019. No evidence of interval intracranial hemorrhage. Stable generalized parenchymal atrophy and chronic ischemic changes with chronic infarcts as described. Mild paranasal sinus mucosal thickening. Electronically Signed   By: Kellie Simmering DO   On: 09/05/2019 09:57   ECHOCARDIOGRAM LIMITED  Result Date: 09/05/2019    ECHOCARDIOGRAM LIMITED REPORT   Patient Name:   Brian Brown Date of Exam: 09/05/2019 Medical Rec #:  [id number]        Height:       68.0 in Accession #:    [id number]       Weight:       158.6 lb Date of Birth:  03-12-1932        BSA:          1.852 m Patient Age:    86 years         BP:           154/129 mmHg Patient Gender: M                HR:           98 bpm. Exam  Location:  Inpatient Procedure: Limited Echo, Cardiac Doppler and Color Doppler Indications:    Cardiomyopathy  History:        Patient has prior history of  Echocardiogram examinations, most                 recent 08/07/2019. Cardiomyopathy and CHF, Previous Myocardial                 Infarction and CAD, Stroke, Arrythmias:Atrial Fibrillation; Risk                 Factors:Hypertension and Dyslipidemia.  Sonographer:    Dustin Flock Referring Phys: [place]  1. Left ventricular ejection fraction, by estimation, is 20%. The left ventricle has severely decreased function. The left ventricle demonstrates global hypokinesis. There is mild left ventricular hypertrophy. Left ventricular diastolic parameters are indeterminate.  2. Right ventricular systolic function is moderately reduced. The right ventricular size is normal. There is mildly elevated pulmonary artery systolic pressure. The estimated right ventricular systolic pressure is 95.6 mmHg.  3. The mitral valve is normal in structure. Mild mitral valve regurgitation. No evidence of mitral stenosis.  4. The aortic valve is normal in structure. Aortic valve regurgitation is not visualized. Mild aortic valve sclerosis is present, with no evidence of aortic valve stenosis.  5. The inferior vena cava is dilated in size with <50% respiratory variability, suggesting right atrial pressure of 15 mmHg.  6. There is a significant left pleural effusion.  7. The patient is in atrial fibrillation. FINDINGS  Left Ventricle: Left ventricular ejection fraction, by estimation, is 20%. The left ventricle has severely decreased function. The left ventricle demonstrates global hypokinesis. The left ventricular internal cavity size was normal in size. There is mild left ventricular hypertrophy. Right Ventricle: The right ventricular size is normal. Right ventricular systolic function is moderately reduced. There is mildly elevated pulmonary artery systolic pressure. The tricuspid regurgitant velocity is 2.67 m/s, and with an assumed right atrial pressure of 15 mmHg, the estimated right ventricular systolic pressure  is 21.3 mmHg. Pericardium: There is a significant left pleural effusion. There is no evidence of pericardial effusion. Mitral Valve: The mitral valve is normal in structure. Mild mitral valve regurgitation. No evidence of mitral valve stenosis. Tricuspid Valve: The tricuspid valve is normal in structure. Tricuspid valve regurgitation is mild. Aortic Valve: The aortic valve is normal in structure. Aortic valve regurgitation is not visualized. Mild aortic valve sclerosis is present, with no evidence of aortic valve stenosis. Venous: The inferior vena cava is dilated in size with less than 50% respiratory variability, suggesting right atrial pressure of 15 mmHg. LEFT VENTRICLE PLAX 2D LVIDd:         4.80 cm Diastology LVIDs:         4.30 cm LV e' lateral:   7.36 cm/s LV PW:         1.30 cm LV E/e' lateral: 10.6 LV IVS:        1.40 cm LV e' medial:    5.26 cm/s                        LV E/e' medial:  14.8  RIGHT VENTRICLE RV S prime:     7.10 cm/s MITRAL VALVE               TRICUSPID VALVE MV Area (PHT): 6.17 cm    TR Peak grad:   28.5 mmHg MV Decel Time: 123 msec  TR Vmax:        267.00 cm/s MV E velocity: 78.00 cm/s MV A velocity: 20.60 cm/s MV E/A ratio:  3.79 Loralie Champagne MD Electronically signed by Loralie Champagne MD Signature Date/Time: 09/05/2019/5:39:10 PM    Final     Cardiac Studies   Echocardiogram 08/07/2019 1. Left ventricular ejection fraction by 3D volume is 21 %. The left  ventricle has severely decreased function. The left ventricle demonstrates  regional wall motion abnormalities (see scoring diagram/findings for  description). There is mild left  ventricular hypertrophy. Left ventricular diastolic parameters are  indeterminate.  2. Right ventricular systolic function is moderately reduced. The right  ventricular size is normal. There is mildly elevated pulmonary artery  systolic pressure. The estimated right ventricular systolic pressure is  18.5 mmHg.  3. Left atrial size was  moderately dilated.  4. Right atrial size was moderately dilated.  5. The mitral valve is degenerative. Mild mitral valve regurgitation. No  evidence of mitral stenosis.  6. The aortic valve is abnormal. Aortic valve regurgitation is trivial.  No aortic stenosis is present.  7. The inferior vena cava is dilated in size with <50% respiratory  variability, suggesting right atrial pressure of 15 mmHg.    Echocardiogram 09/05/2019 1. Left ventricular ejection fraction, by estimation, is 20%. The left  ventricle has severely decreased function. The left ventricle demonstrates  global hypokinesis. There is mild left ventricular hypertrophy. Left  ventricular diastolic parameters are  indeterminate.  2. Right ventricular systolic function is moderately reduced. The right  ventricular size is normal. There is mildly elevated pulmonary artery  systolic pressure. The estimated right ventricular systolic pressure is  63.1 mmHg.  3. The mitral valve is normal in structure. Mild mitral valve  regurgitation. No evidence of mitral stenosis.  4. The aortic valve is normal in structure. Aortic valve regurgitation is  not visualized. Mild aortic valve sclerosis is present, with no evidence  of aortic valve stenosis.  5. The inferior vena cava is dilated in size with <50% respiratory  variability, suggesting right atrial pressure of 15 mmHg.  6. There is a significant left pleural effusion.  7. The patient is in atrial fibrillation.   Patient Profile     86 y.o. male with a history of CAD s/p NSTEMI 10/2018, CVA x 2 (last 02/2018), PAF, HTN, HL, ICM/HFrEF (EF 21% 08/2019), CKD II, and R thalamic bleed (08/2019) prompting discontinuationof Eliquis and Plavix therapy, who is being seen for the evaluation ofacute on chronic systolic heart failure and persistent atrial fibrillation  Assessment & Plan    Acute on chronic systolic and diastolic HF:      EF remains low as he has recovered.   Continue to titrate meds.   I have titrated ARB and beta blocker.  I will switch to [facility] today.    Looks like he will need O2 at discharge.   I see the plan is to send him home.  He will need significant home health services.    CAD:  Medical management. No active ischemic symptoms.   Atrial fib:  Restarted Eliquis.    HTN:   This is being managed in the context of treating his CHF   Dyslipidemia: LDL at target.  Continue current therapy.    CKD II: Creat is not done today but is stable.  It will need to be followed closely at discharge and I think he would need TOC follow up within one week of discharge.  For questions or updates, please contact [place] Please consult [email or web address] for contact info under Cardiology/STEMI.   Signed, Minus Breeding, MD  09/06/2019, 8:41 AM

## 2019-09-06 NOTE — Discharge Summary (Signed)
Physician Discharge Summary  GAR GLANCE GMW:[id number] DOB: 21-Sep-1932 DOA: 08/31/2019  PCP: Seward Carol, MD  Admit date: 08/31/2019 Discharge date: 09/06/2019  Admitted From: home Disposition:  home   Recommendations for Outpatient Follow-up:  1. Follow closely on fluid status  Home Health:  ordered  Discharge Condition:  stable   CODE STATUS:  Full code   Diet recommendation:  Heart healthy Consultations:  cardiology  Procedures/Studies: .    Discharge Diagnoses:  Principal Problem:   Acute on chronic systolic CHF (congestive heart failure) (HCC) Active Problems:   PAF (paroxysmal atrial fibrillation) (HCC)   Elevated troponin   Acute systolic heart failure (HCC)   AMS (altered mental status)   Acute respiratory failure with hypoxia (HCC)   Lactic acidosis   History of intracerebral hemorrhage without residual deficit   Persistent atrial fibrillation (HCC)   Coronary artery disease     Brief Summary: 86 year old gentleman with prior history of CVA with right-sided weakness, coronary artery disease s/p NSTEMI, cardiomyopathy, paroxysmal atrial fibrillation, 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  He was recently hospitalized for acute on chronic systolic failure, from 04/08/345 to 08/11/2019.  His left ventricular ejection fraction has decreased 20%.  Hospital course was also complicated by acute mental status change and he was found to have right lateral thalamic intracranial hemorrhage.  At that point his Eliquis and Plavix were held but continue to take aspirin as per neurology.    On arrival to ED he was hypotensive and hypoxic requiring about 2 L of nasal cannula oxygen.  Chest x-ray showed hazy bilateral lung base opacities and cardiomegaly suggestive of fluid overload.  He was started on IV Lasix for  diuresis and cardiology consulted for recommendations. Neurology suggest repeating another CT of the head without contrast today and to resume Eliquis. MRI of the brain and CT of the head today shows stable right thalamic intracranial hemorrhage. Cardiology recommends to continue with oral Lasix and that patient is euvolemic. Repeat chest x-ray showed persistent bilateral opacities . PT evaluation recommending home health PT and supervision for mobility.  Patient seen and examined this morning he appears to be confused, slight visual hallucinations.  Hospital Course:  Acute systolic CHF -ECHO 4/25>  EF is 20% with global hypokinesis - he has been adequately diuresed and has been transitioned to oral Lasix - Cardiology recommends Entresto and has started it today - cont Toprol XL - palliative care will follow at home  Dementia - he has confusion which the family states it permanent- will need to continue 24 hr care at home  A-fib, paroxsymal, - cont Eliquis and Toprol   h/o CVA- recent ICH On Eliquis and Lipitor- aspirin has been stopped    Discharge Exam: Vitals:   09/06/19 0830 09/06/19 1130  BP: (!) 122/94 109/80  Pulse: 86 86  Resp: 20 20  Temp: 98.7 F (37.1 C) (!) 97.5 F (36.4 C)  SpO2: 100% 100%   Vitals:   09/05/19 1936 09/06/19 0447 09/06/19 0830 09/06/19 1130  BP: (!) 128/108 (!) 133/97 (!) 122/94 109/80  Pulse: 98 89 86 86  Resp: 18 18 20 20   Temp: 98.2 F (36.8 C) 98.7 F (37.1 C) 98.7 F (37.1 C) (!) 97.5 F (36.4 C)  TempSrc: Oral Oral Oral Oral  SpO2: 100% 100% 100% 100%  Weight:  70.8 kg    Height:  General: Pt is alert, awake, not in acute distress Cardiovascular: RRR, S1/S2 +, no rubs, no gallops Respiratory: CTA bilaterally, no wheezing, no rhonchi Abdominal: Soft, NT, ND, bowel sounds + Extremities: no edema, no cyanosis   Discharge Instructions  Discharge Instructions    (HEART FAILURE PATIENTS) Call MD:  Anytime you have any  of the following symptoms: 1) 3 pound weight gain in 24 hours or 5 pounds in 1 week 2) shortness of breath, with or without a dry hacking cough 3) swelling in the hands, feet or stomach 4) if you have to sleep on extra pillows at night in order to breathe.   Complete by: As directed    AMB referral to CHF clinic   Complete by: As directed    Diet - low sodium heart healthy   Complete by: As directed    Increase activity slowly   Complete by: As directed      Allergies as of 09/06/2019   No Known Allergies     Medication List    STOP taking these medications   aspirin 81 MG EC tablet   ibuprofen 200 MG tablet Commonly known as: ADVIL   losartan 25 MG tablet Commonly known as: COZAAR     TAKE these medications   apixaban 5 MG Tabs tablet Commonly known as: ELIQUIS Take 1 tablet (5 mg total) by mouth 2 (two) times daily.   atorvastatin 40 MG tablet Commonly known as: LIPITOR TAKE 1 TABLET EVERY DAY What changed: when to take this   famotidine 20 MG tablet Commonly known as: PEPCID Take 1 tablet (20 mg total) by mouth at bedtime.   furosemide 40 MG tablet Commonly known as: LASIX Take 1 tablet (40 mg total) by mouth 2 (two) times daily.   latanoprost 0.005 % ophthalmic solution Commonly known as: XALATAN Place 1 drop into both eyes at bedtime.   metoprolol succinate 25 MG 24 hr tablet Commonly known as: TOPROL-XL Take 1 tablet (25 mg total) by mouth 2 (two) times daily with a meal.   multivitamin with minerals Tabs tablet Take 1 tablet by mouth daily.   nitroGLYCERIN 0.4 MG SL tablet Commonly known as: NITROSTAT Place 1 tablet (0.4 mg total) under the tongue every 5 (five) minutes x 3 doses as needed for chest pain.   OVER THE COUNTER MEDICATION Take 2 capsules by mouth at bedtime. Omega XL   sacubitril-valsartan 24-26 MG Commonly known as: ENTRESTO Take 1 tablet by mouth 2 (two) times daily.       Follow-up Information    [facility] Follow up.    Specialty: Home Health Services Why: HHPT, HHOT Contact information: [address] [place] [address] [phone]        Jettie Booze, MD Follow up on 09/08/2019.   Specialties: Cardiology, Radiology, Interventional Cardiology Why: 10:20AM. Cardiology follow up Contact information: [address] [address] [phone]              No Known Allergies    DG Chest 1 View  Result Date: 09/05/2019 CLINICAL DATA:  Labored breathing. EXAM: CHEST  1 VIEW COMPARISON:  August 31, 2019. FINDINGS: Stable cardiomegaly. No pneumothorax is noted. Increased bibasilar opacities are noted concerning for worsening pulmonary edema with associated pleural effusions. Bony thorax is unremarkable. IMPRESSION: Increased bibasilar opacities are noted concerning for worsening pulmonary edema with associated pleural effusions. Aortic Atherosclerosis (ICD10-I70.0). Electronically Signed   By: Bobbe Medico.D.  On: 09/05/2019 12:54   DG Chest 1 View  Result Date: 08/31/2019 CLINICAL DATA:  Shortness of breath and chest discomfort for 2 weeks. EXAM: CHEST  1 VIEW COMPARISON:  Radiograph and CT 08/06/2019 FINDINGS: Loop recorder projects over the left chest wall. Cardiomegaly with slight progression. Unchanged mediastinal contours. Increase in hazy bilateral lung base opacities likely combination of pleural effusion and atelectasis/airspace disease. No evidence of pulmonary edema. No pneumothorax. No acute osseous abnormalities are seen. IMPRESSION: Increase in hazy bilateral lung base opacities likely combination of pleural effusions and atelectasis/airspace disease. Pneumonia is also considered. Cardiomegaly is slightly increased. Overall findings suggest fluid overload. Electronically Signed   By: Keith Rake M.D.   On: 08/31/2019 18:49   CT HEAD WO CONTRAST  Result Date: 09/05/2019 CLINICAL DATA:  Stroke, follow-up; ICH follow-up. Obtained CT head  without contrast 2 days post restart of Eliquis. EXAM: CT HEAD WITHOUT CONTRAST TECHNIQUE: Contiguous axial images were obtained from the base of the skull through the vertex without intravenous contrast. COMPARISON:  Brain MRI 09/01/2019, head CT 08/31/2019 FINDINGS: Brain: Stable, moderate generalized parenchymal atrophy. An 8 mm right thalamic hemorrhage unchanged in size as compared to the head CT of 08/31/2019. No evidence of interval intracranial hemorrhage. Redemonstrated chronic cortically based infarcts within the bilateral parietal and right occipital lobes. Redemonstrated chronic lacunar infarcts within the left corona radiata and right thalamus. Stable background moderate ill-defined hypoattenuation within the cerebral white matter which is nonspecific, but consistent with chronic small vessel ischemic disease. No extra-axial fluid collection. No midline shift. Vascular: No hyperdense vessel.  Atherosclerotic calcifications. Skull: Normal. Negative for fracture or focal lesion. Sinuses/Orbits: Visualized orbits show no acute finding. Mild paranasal sinus mucosal thickening, most notably ethmoidal. No significant mastoid effusion. IMPRESSION: An 8 mm right thalamic parenchymal hemorrhage is unchanged in size as compared to the head CT of 08/31/2019. No evidence of interval intracranial hemorrhage. Stable generalized parenchymal atrophy and chronic ischemic changes with chronic infarcts as described. Mild paranasal sinus mucosal thickening. Electronically Signed   By: Kellie Simmering DO   On: 09/05/2019 09:57   CT Head Wo Contrast  Result Date: 08/31/2019 CLINICAL DATA:  Mental status change EXAM: CT HEAD WITHOUT CONTRAST TECHNIQUE: Contiguous axial images were obtained from the base of the skull through the vertex without intravenous contrast. COMPARISON:  MRI 08/08/2019, CT brain 08/07/2019 FINDINGS: Brain: No acute territorial infarction is visualized. Focal hyperdensity in the right thalamus measuring  8 mm, measures slightly smaller compared to the prior head CT and slightly more dense. There is mild surrounding hypodensity likely due to edema. Moderate atrophy. Moderate hypodensity in the white matter consistent with chronic small vessel ischemic change. Chronic infarct in the right occipital lobe. Chronic lacunar infarct in the left white matter. Stable ventricle size. Vascular: No hyperdense vessels.  Carotid vascular calcification. Skull: Normal. Negative for fracture or focal lesion. Sinuses/Orbits: No acute finding. Other: None IMPRESSION: 1. 8 mm focal hyperdensity in the right thalamus at the location of previous thalamic hemorrhage. This appears smaller and more dense compared to prior suggesting developing calcification within the lesion. Suspect that there may be an underlying lesion such as cavernoma without definitive evidence for acute interval bleeding since comparison head CT from earlier this month. Though acute bleed is felt unlikely, short interval 6 hour CT follow-up may be performed to ensure no change in size of the lesion; if lesion is stable, follow-up CT in 1 month may help to more definitively characterize the right thalamic lesion.  2. Atrophy and chronic small vessel ischemic changes of the white matter. Chronic right occipital infarct. Critical Value/emergent results were called by telephone at the time of interpretation on 08/31/2019 at 11:21 pm to provider [facility] , who verbally acknowledged these results. Electronically Signed   By: Donavan Foil M.D.   On: 08/31/2019 23:21   CT HEAD WO CONTRAST  Result Date: 08/07/2019 CLINICAL DATA:  Stroke.  Follow-up intracranial hemorrhage. EXAM: CT HEAD WITHOUT CONTRAST TECHNIQUE: Contiguous axial images were obtained from the base of the skull through the vertex without intravenous contrast. COMPARISON:  CT head without contrast 08/07/2019 at 6:10 p.m. FINDINGS: Brain: Right thalamic hemorrhage is stable in size at 10 mm. Surrounding  edema is stable. No new hemorrhage is present. Moderate atrophy and white matter changes are stable. Cortical infarct of the right parietal lobe is stable. No new infarcts are present. Basal ganglia are otherwise within normal limits. Remote infarct of the anterior right insular cortex is again noted. Remote infarct of the medial right occipital lobe is again noted. Vascular: No hyperdense vessel or unexpected calcification. Skull: Calvarium is intact. No focal lytic or blastic lesions are present. No significant extracranial soft tissue lesion is present. Sinuses/Orbits: The paranasal sinuses and mastoid air cells are clear. Bilateral lens replacements are noted. Globes and orbits are otherwise unremarkable. IMPRESSION: 1. Stable 10 mm right thalamic hemorrhage with surrounding edema. 2. Stable atrophy and white matter disease. 3. Stable remote infarcts of the right parietal lobe, anterior right insular cortex, and medial right occipital lobe. Electronically Signed   By: San Morelle M.D.   On: 08/07/2019 22:49   CT HEAD WO CONTRAST  Result Date: 08/07/2019 CLINICAL DATA:  Mental status change, confusion EXAM: CT HEAD WITHOUT CONTRAST TECHNIQUE: Contiguous axial images were obtained from the base of the skull through the vertex without intravenous contrast. COMPARISON:  CT head 02/13/2018 FINDINGS: Brain: 10 mm hyperdensity right thalamus is new since the prior study most consistent with acute hemorrhage. No significant mass-effect or surrounding edema. Ventricle size normal. Patchy white matter hypodensity bilaterally is chronic and unchanged. Chronic infarct right occipital lobe was not present previously. Negative for acute infarct. Vascular: Negative for hyperdense vessel Skull: Negative Sinuses/Orbits: Negative Other: None IMPRESSION: 10 mm hyperdensity right thalamus compatible with acute hemorrhage. Atrophy and chronic ischemic change. Chronic infarct right occipital lobe. These results were  called by telephone at the time of interpretation on 08/07/2019 at 6:45 pm to provider [facility] , who verbally acknowledged these results. Electronically Signed   By: Franchot Gallo M.D.   On: 08/07/2019 18:43   MR ANGIO HEAD WO CONTRAST  Result Date: 08/08/2019 CLINICAL DATA:  Rule out metastatic disease. Intracranial hemorrhage on CT. Atrial fibrillation on Eliquis. EXAM: MRI HEAD WITHOUT AND WITH CONTRAST MRA HEAD WITHOUT CONTRAST TECHNIQUE: Multiplanar, multiecho pulse sequences of the brain and surrounding structures were obtained without and with intravenous contrast. Angiographic images of the head were obtained using MRA technique without contrast. CONTRAST:  36mL GADAVIST GADOBUTROL 1 MMOL/ML IV SOLN COMPARISON:  CT head 08/07/2019 FINDINGS: MRI HEAD FINDINGS Brain: Hemorrhage in the right lateral thalamus measuring approximately 10 mm. There is central methemoglobin suggesting this is a subacute hemorrhage, possibly 33 week old. Additional areas of chronic microhemorrhage in the occipital lobe bilaterally and in the right cerebellum and brainstem bilaterally. Generalized atrophy. Patchy white matter hyperintensity bilaterally. Chronic infarct right occipital lobe. Negative for acute infarct. No enhancing metastatic deposits identified. Vascular: Normal arterial flow voids Skull  and upper cervical spine: No focal skeletal lesion. Sinuses/Orbits: Mild mucosal edema paranasal sinuses. Bilateral cataract extraction Other: None MRA HEAD FINDINGS Both vertebral arteries patent to the basilar without stenosis. The left vertebral artery is tortuous with flattening of the ventral surface of the medulla. Small PICA bilaterally. AICA patent on the left. Basilar widely patent. AICA, superior cerebellar, posterior cerebral arteries patent bilaterally without stenosis. Cavernous carotid patent bilaterally without stenosis. Anterior and middle cerebral arteries patent bilaterally without stenosis or large vessel  occlusion. Negative for cerebral aneurysm. IMPRESSION: 1. 1 cm hemorrhage right lateral thalamus appears subacute with methemoglobin present. Additional areas of chronic microhemorrhage in the brain. This may be due to hypertension 2. Negative for metastatic disease. No enhancing lesions in the brain. 3. Moderate chronic microvascular ischemic change in the white matter. 4. Negative MRA head Electronically Signed   By: Franchot Gallo M.D.   On: 08/08/2019 11:42   MR BRAIN W WO CONTRAST  Result Date: 09/01/2019 CLINICAL DATA:  Right thalamic bleed. EXAM: MRI HEAD WITHOUT AND WITH CONTRAST TECHNIQUE: Multiplanar, multiecho pulse sequences of the brain and surrounding structures were obtained without and with intravenous contrast. CONTRAST:  7.16mL GADAVIST GADOBUTROL 1 MMOL/ML IV SOLN COMPARISON:  08/08/2019 FINDINGS: Brain: Contraction and resolved T1 hyperintensity at the right thalamic remote hemorrhage. Now, the appearance is primarily attributed to hemosiderin residua. No superimposed enhancement or edema, no underlying lesion on 02/24/2018 brain MRI. Other remote micro hemorrhages have been seen in the brainstem and deep white matter, consider hypertensive. Chronic small vessel ischemic gliosis in the cerebral white matter with chronic lacune is at the left corona radiata and right upper thalamus. Small remote right occipital and parietal cortex infarcts. No evidence of mass, hydrocephalus, or collection. Vascular: Normal flow voids and vascular enhancement Skull and upper cervical spine: Cervical spine degeneration with large ventral spur at C2-3. No evidence of bone lesion Sinuses/Orbits: Bilateral cataract resection.  No acute finding IMPRESSION: 1. Expected evolution of the now chronic right thalamic hemorrhage. No underlying lesion, the hemorrhage is attributed to hypertension. 2. Chronic small vessel ischemia and small remote right occipital parietal infarcts. Electronically Signed   By: Monte Fantasia  M.D.   On: 09/01/2019 05:19   MR BRAIN W WO CONTRAST  Result Date: 08/08/2019 CLINICAL DATA:  Rule out metastatic disease. Intracranial hemorrhage on CT. Atrial fibrillation on Eliquis. EXAM: MRI HEAD WITHOUT AND WITH CONTRAST MRA HEAD WITHOUT CONTRAST TECHNIQUE: Multiplanar, multiecho pulse sequences of the brain and surrounding structures were obtained without and with intravenous contrast. Angiographic images of the head were obtained using MRA technique without contrast. CONTRAST:  57mL GADAVIST GADOBUTROL 1 MMOL/ML IV SOLN COMPARISON:  CT head 08/07/2019 FINDINGS: MRI HEAD FINDINGS Brain: Hemorrhage in the right lateral thalamus measuring approximately 10 mm. There is central methemoglobin suggesting this is a subacute hemorrhage, possibly 50 week old. Additional areas of chronic microhemorrhage in the occipital lobe bilaterally and in the right cerebellum and brainstem bilaterally. Generalized atrophy. Patchy white matter hyperintensity bilaterally. Chronic infarct right occipital lobe. Negative for acute infarct. No enhancing metastatic deposits identified. Vascular: Normal arterial flow voids Skull and upper cervical spine: No focal skeletal lesion. Sinuses/Orbits: Mild mucosal edema paranasal sinuses. Bilateral cataract extraction Other: None MRA HEAD FINDINGS Both vertebral arteries patent to the basilar without stenosis. The left vertebral artery is tortuous with flattening of the ventral surface of the medulla. Small PICA bilaterally. AICA patent on the left. Basilar widely patent. AICA, superior cerebellar, posterior cerebral arteries patent bilaterally  without stenosis. Cavernous carotid patent bilaterally without stenosis. Anterior and middle cerebral arteries patent bilaterally without stenosis or large vessel occlusion. Negative for cerebral aneurysm. IMPRESSION: 1. 1 cm hemorrhage right lateral thalamus appears subacute with methemoglobin present. Additional areas of chronic microhemorrhage in the  brain. This may be due to hypertension 2. Negative for metastatic disease. No enhancing lesions in the brain. 3. Moderate chronic microvascular ischemic change in the white matter. 4. Negative MRA head Electronically Signed   By: Franchot Gallo M.D.   On: 08/08/2019 11:42   ECHOCARDIOGRAM LIMITED  Result Date: 09/05/2019    ECHOCARDIOGRAM LIMITED REPORT   Patient Name:   BARKLEY KRATOCHVIL Date of Exam: 09/05/2019 Medical Rec #:  [id number]        Height:       68.0 in Accession #:    [id number]       Weight:       158.6 lb Date of Birth:  1932-12-28        BSA:          1.852 m Patient Age:    37 years         BP:           154/129 mmHg Patient Gender: M                HR:           98 bpm. Exam Location:  Inpatient Procedure: Limited Echo, Cardiac Doppler and Color Doppler Indications:    Cardiomyopathy  History:        Patient has prior history of Echocardiogram examinations, most                 recent 08/07/2019. Cardiomyopathy and CHF, Previous Myocardial                 Infarction and CAD, Stroke, Arrythmias:Atrial Fibrillation; Risk                 Factors:Hypertension and Dyslipidemia.  Sonographer:    Dustin Flock Referring Phys: [place]  1. Left ventricular ejection fraction, by estimation, is 20%. The left ventricle has severely decreased function. The left ventricle demonstrates global hypokinesis. There is mild left ventricular hypertrophy. Left ventricular diastolic parameters are indeterminate.  2. Right ventricular systolic function is moderately reduced. The right ventricular size is normal. There is mildly elevated pulmonary artery systolic pressure. The estimated right ventricular systolic pressure is 59.1 mmHg.  3. The mitral valve is normal in structure. Mild mitral valve regurgitation. No evidence of mitral stenosis.  4. The aortic valve is normal in structure. Aortic valve regurgitation is not visualized. Mild aortic valve sclerosis is present, with no evidence of  aortic valve stenosis.  5. The inferior vena cava is dilated in size with <50% respiratory variability, suggesting right atrial pressure of 15 mmHg.  6. There is a significant left pleural effusion.  7. The patient is in atrial fibrillation. FINDINGS  Left Ventricle: Left ventricular ejection fraction, by estimation, is 20%. The left ventricle has severely decreased function. The left ventricle demonstrates global hypokinesis. The left ventricular internal cavity size was normal in size. There is mild left ventricular hypertrophy. Right Ventricle: The right ventricular size is normal. Right ventricular systolic function is moderately reduced. There is mildly elevated pulmonary artery systolic pressure. The tricuspid regurgitant velocity is 2.67 m/s, and with an assumed right atrial pressure of 15 mmHg, the estimated right ventricular systolic pressure is 63.8 mmHg. Pericardium: There  is a significant left pleural effusion. There is no evidence of pericardial effusion. Mitral Valve: The mitral valve is normal in structure. Mild mitral valve regurgitation. No evidence of mitral valve stenosis. Tricuspid Valve: The tricuspid valve is normal in structure. Tricuspid valve regurgitation is mild. Aortic Valve: The aortic valve is normal in structure. Aortic valve regurgitation is not visualized. Mild aortic valve sclerosis is present, with no evidence of aortic valve stenosis. Venous: The inferior vena cava is dilated in size with less than 50% respiratory variability, suggesting right atrial pressure of 15 mmHg. LEFT VENTRICLE PLAX 2D LVIDd:         4.80 cm Diastology LVIDs:         4.30 cm LV e' lateral:   7.36 cm/s LV PW:         1.30 cm LV E/e' lateral: 10.6 LV IVS:        1.40 cm LV e' medial:    5.26 cm/s                        LV E/e' medial:  14.8  RIGHT VENTRICLE RV S prime:     7.10 cm/s MITRAL VALVE               TRICUSPID VALVE MV Area (PHT): 6.17 cm    TR Peak grad:   28.5 mmHg MV Decel Time: 123 msec    TR  Vmax:        267.00 cm/s MV E velocity: 78.00 cm/s MV A velocity: 20.60 cm/s MV E/A ratio:  3.79 Loralie Champagne MD Electronically signed by Loralie Champagne MD Signature Date/Time: 09/05/2019/5:39:10 PM    Final       The results of significant diagnostics from this hospitalization (including imaging, microbiology, ancillary and laboratory) are listed below for reference.     Microbiology: Recent Results (from the past 240 hour(s))  SARS Coronavirus 2 by RT PCR (hospital order, performed in [facility] hospital lab) Nasopharyngeal Nasopharyngeal Swab     Status: None   Collection Time: 08/31/19  7:13 PM   Specimen: Nasopharyngeal Swab  Result Value Ref Range Status   SARS Coronavirus 2 NEGATIVE NEGATIVE Final    Comment: (NOTE) SARS-CoV-2 target nucleic acids are NOT DETECTED.  The SARS-CoV-2 RNA is generally detectable in upper and lower respiratory specimens during the acute phase of infection. The lowest concentration of SARS-CoV-2 viral copies this assay can detect is 250 copies / mL. A negative result does not preclude SARS-CoV-2 infection and should not be used as the sole basis for treatment or other patient management decisions.  A negative result may occur with improper specimen collection / handling, submission of specimen other than nasopharyngeal swab, presence of viral mutation(s) within the areas targeted by this assay, and inadequate number of viral copies (<250 copies / mL). A negative result must be combined with clinical observations, patient history, and epidemiological information.  Fact Sheet for Patients:   StrictlyIdeas.no  Fact Sheet for Healthcare Providers: BankingDealers.co.za  This test is not yet approved or  cleared by the [place] FDA and has been authorized for detection and/or diagnosis of SARS-CoV-2 by FDA under an Emergency Use Authorization (EUA).  This EUA will remain in effect (meaning this test  can be used) for the duration of the COVID-19 declaration under Section 564(b)(1) of the Act, 21 U.S.C. section 360bbb-3(b)(1), unless the authorization is terminated or revoked sooner.  Performed at [facility], [place]  [address] [place], [place] [address]      Labs: BNP (last 3 results) Recent Labs    10/18/18 2355 08/06/19 1642 08/31/19 1833  BNP 204.1* 1,349.7* 2,458.0*   Basic Metabolic Panel: Recent Labs  Lab 09/01/19 0405 09/02/19 0245 09/03/19 0453 09/04/19 0453 09/05/19 0739 09/06/19 0758  NA 140 138 140 138 139  --   K 3.3* 3.3* 3.6 3.8 4.9  --   CL 102 102 99 100 102  --   CO2 28 25 32 30 26  --   GLUCOSE 107* 93 96 103* 110*  --   BUN 22 21 18  24* 27*  --   CREATININE 1.32* 1.04 1.35* 1.35* 1.40*  --   CALCIUM 9.0 8.6* 8.8* 8.9 9.1  --   MG  --   --  1.7  --   --  1.9   Liver Function Tests: Recent Labs  Lab 08/31/19 1833  AST 33  ALT 41  ALKPHOS 103  BILITOT 2.1*  PROT 7.6  ALBUMIN 3.5   No results for input(s): LIPASE, AMYLASE in the last 168 hours. No results for input(s): AMMONIA in the last 168 hours. CBC: Recent Labs  Lab 08/31/19 0809 09/04/19 0453 09/05/19 0739 09/06/19 0758  WBC 6.9 6.5 7.2 7.8  HGB 13.7 12.6* 14.5 15.2  HCT 42.5 38.8* 44.3 45.7  MCV 93.4 92.2 92.9 91.4  PLT 187 167 180 189   Cardiac Enzymes: No results for input(s): CKTOTAL, CKMB, CKMBINDEX, TROPONINI in the last 168 hours. BNP: Invalid input(s): POCBNP CBG: No results for input(s): GLUCAP in the last 168 hours. D-Dimer No results for input(s): DDIMER in the last 72 hours. Hgb A1c No results for input(s): HGBA1C in the last 72 hours. Lipid Profile No results for input(s): CHOL, HDL, LDLCALC, TRIG, CHOLHDL, LDLDIRECT in the last 72 hours. Thyroid function studies No results for input(s): TSH, T4TOTAL, T3FREE, THYROIDAB in the last 72 hours.  Invalid input(s): FREET3 Anemia work up No results for input(s): VITAMINB12, FOLATE, FERRITIN, TIBC,  IRON, RETICCTPCT in the last 72 hours. Urinalysis    Component Value Date/Time   COLORURINE AMBER (A) 08/31/2019 1322   APPEARANCEUR HAZY (A) 08/31/2019 1322   LABSPEC 1.025 08/31/2019 1322   PHURINE 5.0 08/31/2019 1322   GLUCOSEU NEGATIVE 08/31/2019 1322   HGBUR SMALL (A) 08/31/2019 1322   BILIRUBINUR NEGATIVE 08/31/2019 1322   KETONESUR NEGATIVE 08/31/2019 1322   PROTEINUR 100 (A) 08/31/2019 1322   UROBILINOGEN 0.2 01/27/2011 1233   NITRITE NEGATIVE 08/31/2019 1322   LEUKOCYTESUR NEGATIVE 08/31/2019 1322   Sepsis Labs Invalid input(s): PROCALCITONIN,  WBC,  LACTICIDVEN Microbiology Recent Results (from the past 240 hour(s))  SARS Coronavirus 2 by RT PCR (hospital order, performed in [place] hospital lab) Nasopharyngeal Nasopharyngeal Swab     Status: None   Collection Time: 08/31/19  7:13 PM   Specimen: Nasopharyngeal Swab  Result Value Ref Range Status   SARS Coronavirus 2 NEGATIVE NEGATIVE Final    Comment: (NOTE) SARS-CoV-2 target nucleic acids are NOT DETECTED.  The SARS-CoV-2 RNA is generally detectable in upper and lower respiratory specimens during the acute phase of infection. The lowest concentration of SARS-CoV-2 viral copies this assay can detect is 250 copies / mL. A negative result does not preclude SARS-CoV-2 infection and should not be used as the sole basis for treatment or other patient management decisions.  A negative result may occur with improper specimen collection / handling, submission of specimen other than nasopharyngeal swab, presence of viral mutation(s)  within the areas targeted by this assay, and inadequate number of viral copies (<250 copies / mL). A negative result must be combined with clinical observations, patient history, and epidemiological information.  Fact Sheet for Patients:   StrictlyIdeas.no  Fact Sheet for Healthcare Providers: BankingDealers.co.za  This test is not yet  approved or  cleared by the [place] FDA and has been authorized for detection and/or diagnosis of SARS-CoV-2 by FDA under an Emergency Use Authorization (EUA).  This EUA will remain in effect (meaning this test can be used) for the duration of the COVID-19 declaration under Section 564(b)(1) of the Act, 21 U.S.C. section 360bbb-3(b)(1), unless the authorization is terminated or revoked sooner.  Performed at [facility], [place] [address] [place], [place] [address]      Time coordinating discharge in minutes: 65  SIGNED:   Debbe Odea, MD  [facility] 09/06/2019, 12:52 PM

## 2019-09-06 NOTE — Discharge Instructions (Signed)

## 2019-09-06 NOTE — Progress Notes (Signed)
Attempted to go over D/C instructions with pt but pt is anxious about D/C and requested daughter be included. This nurse called daugther, [name], and discussed D/C. She stated she was told yesterday that pt would not D/C today and was not prepared. Stated she lived 5-6hrs drive away and she wasn't sure she could get to hospital today. Also stated she would attempt to find another relative that could transport pt. Will call this nurse back with update.

## 2019-09-07 ENCOUNTER — Other Ambulatory Visit: Payer: Self-pay | Admitting: Internal Medicine

## 2019-09-07 LAB — CBC
HCT: 42.5 % (ref 39.0–52.0)
Hemoglobin: 14 g/dL (ref 13.0–17.0)
MCH: 30.2 pg (ref 26.0–34.0)
MCHC: 32.9 g/dL (ref 30.0–36.0)
MCV: 91.8 fL (ref 80.0–100.0)
Platelets: 214 10*3/uL (ref 150–400)
RBC: 4.63 MIL/uL (ref 4.22–5.81)
RDW: 13.8 % (ref 11.5–15.5)
WBC: 7.6 10*3/uL (ref 4.0–10.5)
nRBC: 0 % (ref 0.0–0.2)

## 2019-09-07 NOTE — Progress Notes (Signed)
Cardiology Office Note   Date:  09/08/2019   ID:  Brian Brown, DOB 1932-12-15, MRN [id number]  PCP:  Seward Carol, MD    No chief complaint on file.  Acute on chronic systolic heart failure  Wt Readings from Last 3 Encounters:  09/08/19 158 lb 12.8 oz (72 kg)  09/07/19 154 lb 12.8 oz (70.2 kg)  08/11/19 164 lb 7.4 oz (74.6 kg)       History of Present Illness: Brian Brown is a 86 y.o. male   Who had stroke in 02/2018 and NSTEMI in 10/2018.  Cath showed: "Cath: 10/19/18   The left ventricular systolic function is normal.  LV end diastolic pressure is normal.  The left ventricular ejection fraction is 55-65% by visual estimate.  Prox RCA lesion is 30% stenosed.  Dist RCA lesion is 20% stenosed.  Ost Cx to Prox Cx lesion is 50% stenosed.  Prox LAD to Mid LAD lesion is 60% stenosed.  1st Diag lesion is 90% stenosed.  Dist LAD lesion is 30% stenosed.  2nd Mrg lesion is 90% stenosed.  1. Severe one-vessel coronary artery disease with subtotal thrombotic occlusion of the distal OM2 at the trifurcation of 3 terminal branches. The vessel in this area is 1.5 mm in diameter and at a trifurcation and thus not suitable for PCI. The vessel is also very tortuous. There is also moderate ostial left circumflex and proximal LAD disease. 2. Normal LV systolic function and left ventricular end-diastolic pressure.  Recommendations: Recommend aggressive medical therapy. Eliquis can be resumed tonight. I favor that he continues to take an antiplatelet medication given this thrombotic event. He seems to be already on Plavix which should be continued."  Echo showed EF 45%-50%.  LINQ showed AFib in 10/2018. Discharged on Eliquis and Plavix.   In 2021, he was not very physically active.  He did have a fall in early 2021.   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 He was recently hospitalized for acute on chronic systolic failure, from 8/4/[id number]/[id number]/175. His left ventricular ejection fraction has decreased20%. Hospital course was also complicated by acute mental status change and he was found to have right lateral thalamic intracranial hemorrhage. At that point his Eliquis and Plavix were held but continue to take aspirin as per neurology.   On arrival to ED he was hypotensive and hypoxic requiring about 2 L of nasal cannula oxygen. Chest x-ray showed hazy bilateral lung base opacities and cardiomegaly suggestive of fluid overload. He was started on IV Lasix for diuresis and cardiology consulted for recommendations. Neurology suggest repeating another CT of the head without contrast today and to resume Eliquis. MRI of the brain and CT of the head today shows stable right thalamic intracranial hemorrhage. Cardiology recommends to continue with oral Lasix and that patient is euvolemic. Repeat chest x-ray showed persistent bilateral opacities. PT evaluation recommending home health PT and supervision for mobility. Patient seen and examined this morning he appears to be confused, slight visual hallucinations.  ECHO 8/31>  EF is 20% with global hypokinesis - he has been adequately diuresed and has been transitioned to oral Lasix - Cardiology recommends Entresto and has started it today - cont Toprol XL - palliative care  will follow at home  Dementia - he has confusion which the family states it permanent- will need to continue 24 hr care at home  A-fib, paroxsymal, - cont Eliquis and Toprol   h/o CVA- recent ICH On Eliquis and Lipitor- aspirin has been stopped"  Past  Medical History:  Diagnosis Date  . Arthritis    degenerative joint disease  . Bleeding stomach ulcer 25 yrs ago  . CAD (coronary artery disease)    a. 10/2018 NSTEMI/Cath: LM nl, LAD 60p/m, 30d, D1 90, LCX 50ost/p, OM2 90 thrombotic, RCA 30p, 20d-->med rx.  . CKD (chronic kidney disease), stage II   . Hepatitis 1981   does not know what kind;denies jaundice  . HFrEF (heart failure with reduced ejection fraction) ([place])    a. 08/2019 Echo: EF 21%.  Marland Kitchen Hypertension   . Ischemic cardiomyopathy    a. 02/2018 Echo: EF 60-65%; b. 10/2018 Echo: EF 45-50%; c. 08/2019 Echo: EF 21%, mild LVH. RVSP 41.16mmHg. Mod BAE. Mild MR. Triv AI.  . Mild dilation of ascending aorta ([place])   . PAF (paroxysmal atrial fibrillation) ([place])    a. 10/2018 noted on Linq-->eliquis initiated (CHA2DS2VASc = 7) however d/c'd in 08/2019 2/2 cerebral bleed.  . Prostate cancer [facility]) 2002   prostate, s/p surgery  . Sleep apnea 2005   does not wear a Cpap  . Stroke [facility])    a. 2003 mild residual right leg wkns; b. 09/1636 embolic stroke, later found to have PAF.  Marland Kitchen Wears dentures    full top  . Wears glasses     Past Surgical History:  Procedure Laterality Date  . BUBBLE STUDY  09/16/2018   Procedure: BUBBLE STUDY;  Surgeon: Lelon Perla, MD;  Location: [facility];  Service: Cardiovascular;;  . CARDIAC CATHETERIZATION  10/19/2018  . CARPAL TUNNEL RELEASE Left 03/29/2013   Procedure: LEFT CARPAL TUNNEL RELEASE;  Surgeon: Wynonia Sours, MD;  Location: [facility];  Service: Orthopedics;  Laterality: Left;  ANESTHESIA: IV REGIONAL FAB  . CARPAL TUNNEL RELEASE Right 03/06/2014   Procedure: RIGHT CARPAL TUNNEL RELEASE;  Surgeon: Daryll Brod, MD;  Location: [facility];  Service: Orthopedics;  Laterality: Right;  . COLONOSCOPY    . HERNIA REPAIR  2009   rt inguinal hernia  . JOINT REPLACEMENT  12/2001   rt hip ;[place] CATH AND CORONARY ANGIOGRAPHY N/A 10/19/2018    Procedure: LEFT HEART CATH AND CORONARY ANGIOGRAPHY;  Surgeon: Wellington Hampshire, MD;  Location: [facility] CV LAB;  Service: Cardiovascular;  Laterality: N/A;  . LOOP RECORDER INSERTION N/A 09/16/2018   Procedure: LOOP RECORDER INSERTION;  Surgeon: Constance Haw, MD;  Location: [place] [facility];  Service: Cardiovascular;  Laterality: N/A;  . PROSTATECTOMY  2002  . SHOULDER ARTHROSCOPY W/ ROTATOR CUFF REPAIR  2014   lt-dr whitfield  . TEE WITHOUT CARDIOVERSION N/A 09/16/2018   Procedure: TRANSESOPHAGEAL ECHOCARDIOGRAM (TEE);  Surgeon: Lelon Perla, MD;  Location: [facility];  Service: Cardiovascular;  Laterality: N/A;  . TOTAL HIP ARTHROPLASTY  02/03/2011   Procedure: TOTAL HIP ARTHROPLASTY;  Surgeon: Garald Balding, MD;  Location: [facility];  Service: Orthopedics;  Laterality: Left;  . TRIGGER FINGER RELEASE Left 03/29/2013   Procedure: LEFT MIDDLE/SMALL FINGER RELEASE A-1 PULLEY AND LEFT RING FINGER;  Surgeon: Wynonia Sours, MD;  Location: [facility];  Service: Orthopedics;  Laterality: Left;  . TRIGGER FINGER RELEASE Right 03/06/2014   Procedure:  RIGHT MIDDLE FINGER,RIGHT RINGER FINGER;  Surgeon: Daryll Brod, MD;  Location: [place];  Service: Orthopedics;  Laterality: Right;     Current Outpatient Medications  Medication Sig Dispense Refill  . apixaban (ELIQUIS) 5 MG TABS tablet Take 1 tablet (5 mg total) by mouth 2 (two) times daily. 60 tablet 0  . atorvastatin (LIPITOR) 40 MG tablet TAKE 1 TABLET EVERY DAY (Patient taking differently: Take 40 mg by mouth every evening. ) 90 tablet 2  . famotidine (PEPCID) 20 MG tablet Take 1 tablet (20 mg total) by mouth at bedtime. 303 tablet 0  . furosemide (LASIX) 40 MG tablet Take 1 tablet (40 mg total) by mouth 2 (two) times daily. 30 tablet 0  . latanoprost (XALATAN) 0.005 % ophthalmic solution Place 1 drop into both eyes at bedtime.    . metoprolol succinate (TOPROL-XL) 25 MG 24 hr tablet Take 1 tablet (25  mg total) by mouth 2 (two) times daily with a meal. 60 tablet 0  . Multiple Vitamin (MULTIVITAMIN WITH MINERALS) TABS tablet Take 1 tablet by mouth daily. 30 tablet 0  . nitroGLYCERIN (NITROSTAT) 0.4 MG SL tablet Place 1 tablet (0.4 mg total) under the tongue every 5 (five) minutes x 3 doses as needed for chest pain. 25 tablet 6  . OVER THE COUNTER MEDICATION Take 2 capsules by mouth at bedtime. Omega XL    . sacubitril-valsartan (ENTRESTO) 24-26 MG Take 1 tablet by mouth 2 (two) times daily. 60 tablet 0   No current facility-administered medications for this visit.    Allergies:   Patient has no known allergies.    Social History:  The patient  reports that he quit smoking about 11 years ago. His smoking use included cigarettes. He has a 30.00 pack-year smoking history. He has never used smokeless tobacco. He reports that he does not drink alcohol and does not use drugs.   Family History:  The patient's family history includes Heart disease in his father and mother.    ROS:  Please see the history of present illness.   Otherwise, review of systems are positive for leg swelling.   All other systems are reviewed and negative.    PHYSICAL EXAM: VS:  BP 110/60   Pulse 89   Ht 5\' 8"  (1.727 m)   Wt 158 lb 12.8 oz (72 kg)   SpO2 92%   BMI 24.15 kg/m  , BMI Body mass index is 24.15 kg/m. GEN: Well nourished, well developed, in no acute distress  HEENT: normal  Neck: no JVD, carotid bruits, or masses Cardiac: irregularly irregular; no murmurs, rubs, or gallops,; Bilateral LE edema R>L Respiratory:  clear to auscultation bilaterally, normal work of breathing GI: soft, nontender, nondistended, + BS MS: no deformity or atrophy  Skin: warm and dry, no rash Neuro:  Strength and sensation are intact Psych: euthymic mood, full affect   EKG:   The ekg ordered 08/31/19 demonstrates AFib, rate controlled    Recent Labs: 11/01/2018: TSH 1.630 08/31/2019: ALT 41; B Natriuretic Peptide  1,344.2 09/05/2019: BUN 27; Creatinine, Ser 1.40; Potassium 4.9; Sodium 139 09/06/2019: Magnesium 1.9 09/07/2019: Hemoglobin 14.0; Platelets 214   Lipid Panel    Component Value Date/Time   CHOL 120 08/09/2019 0417   TRIG 44 08/09/2019 0417   HDL 41 08/09/2019 0417   CHOLHDL 2.9 08/09/2019 0417   VLDL 9 08/09/2019 0417   LDLCALC 70 08/09/2019 0417     Other studies Reviewed: Additional studies/ records that were reviewed  today with results demonstrating: hospital records.   ASSESSMENT AND PLAN:  1.   CAD: Continue aggressive secondary prevention.  He was sent home from the hospital on Eliquis despite recent thalamic hemorrhage.  No angina.   2.   PAF: Eliquis for stroke prevention.  Avoid falls.  3.   Chronic systolic heart failure: Started on Entresto. Check BMet today. Continue metoprolol as well.  4.   Right leg edema: Bilateral R>L.  Elevate legs, compression stockings. Continue with Lasix.  Some venous insufficiency as well.  5.  THey will check with Dr. Leonie Man about need for repeat CT scan.    Current medicines are reviewed at length with the patient today.  The patient concerns regarding his medicines were addressed.  The following changes have been made:  No change  Labs/ tests ordered today include: BMet today No orders of the defined types were placed in this encounter.   Recommend 150 minutes/week of aerobic exercise Low fat, low carb, high fiber diet recommended  Disposition:   FU in 4 months; his daughter want the patient to move closer.  I agreed that this would be beneficial.    Signed, Larae Grooms, MD  09/08/2019 11:20 AM    [address], [place], [place]  [address] Phone: [phone]; Fax: [phone]

## 2019-09-08 ENCOUNTER — Telehealth: Payer: Self-pay | Admitting: Hospice

## 2019-09-08 ENCOUNTER — Other Ambulatory Visit: Payer: Self-pay

## 2019-09-08 ENCOUNTER — Ambulatory Visit: Payer: Medicare HMO | Admitting: Interventional Cardiology

## 2019-09-08 ENCOUNTER — Encounter: Payer: Self-pay | Admitting: Interventional Cardiology

## 2019-09-08 VITALS — BP 110/60 | HR 89 | Ht 68.0 in | Wt 158.8 lb

## 2019-09-08 DIAGNOSIS — I2510 Atherosclerotic heart disease of native coronary artery without angina pectoris: Secondary | ICD-10-CM

## 2019-09-08 DIAGNOSIS — I5022 Chronic systolic (congestive) heart failure: Secondary | ICD-10-CM | POA: Diagnosis not present

## 2019-09-08 DIAGNOSIS — I480 Paroxysmal atrial fibrillation: Secondary | ICD-10-CM

## 2019-09-08 DIAGNOSIS — R600 Localized edema: Secondary | ICD-10-CM | POA: Diagnosis not present

## 2019-09-08 NOTE — Patient Instructions (Signed)
Medication Instructions:  Your physician recommends that you continue on your current medications as directed. Please refer to the Current Medication list given to you today.  *If you need a refill on your cardiac medications before your next appointment, please call your pharmacy*   Lab Work: TODAY: BMET  If you have labs (blood work) drawn today and your tests are completely normal, you will receive your results only by: Marland Kitchen MyChart Message (if you have MyChart) OR . A paper copy in the mail If you have any lab test that is abnormal or we need to change your treatment, we will call you to review the results.   Testing/Procedures: None   Follow-Up: Follow up with Dr. Irish Lack on 01/08/19 at 10:20 AM   Other Instructions Wear compression stockings to help with swelling.  Weigh yourself daily at the same time with the same amount of clothes. Record your weights daily and let us know if you have more than a 3 lb weight gain in 24 hours or more than a 5 lb weight gain in 1 week.  Dr. Leonie Man is your neurologist.   Low-Sodium Eating Plan Sodium, which is an element that makes up salt, helps you maintain a healthy balance of fluids in your body. Too much sodium can increase your blood pressure and cause fluid and waste to be held in your body. Your health care provider or dietitian may recommend following this plan if you have high blood pressure (hypertension), kidney disease, liver disease, or heart failure. Eating less sodium can help lower your blood pressure, reduce swelling, and protect your heart, liver, and kidneys. What are tips for following this plan? General guidelines  Most people on this plan should limit their sodium intake to 1,500-2,000 mg (milligrams) of sodium each day. Reading food labels   The Nutrition Facts label lists the amount of sodium in one serving of the food. If you eat more than one serving, you must multiply the listed amount of sodium by the number of  servings.  Choose foods with less than 140 mg of sodium per serving.  Avoid foods with 300 mg of sodium or more per serving. Shopping  Look for lower-sodium products, often labeled as "low-sodium" or "no salt added."  Always check the sodium content even if foods are labeled as "unsalted" or "no salt added".  Buy fresh foods. ? Avoid canned foods and premade or frozen meals. ? Avoid canned, cured, or processed meats  Buy breads that have less than 80 mg of sodium per slice. Cooking  Eat more home-cooked food and less restaurant, buffet, and fast food.  Avoid adding salt when cooking. Use salt-free seasonings or herbs instead of table salt or sea salt. Check with your health care provider or pharmacist before using salt substitutes.  Cook with plant-based oils, such as canola, sunflower, or olive oil. Meal planning  When eating at a restaurant, ask that your food be prepared with less salt or no salt, if possible.  Avoid foods that contain MSG (monosodium glutamate). MSG is sometimes added to [place] food, bouillon, and some canned foods. What foods are recommended? The items listed may not be a complete list. Talk with your dietitian about what dietary choices are best for you. Grains Low-sodium cereals, including oats, puffed wheat and rice, and shredded wheat. Low-sodium crackers. Unsalted rice. Unsalted pasta. Low-sodium bread. Whole-grain breads and whole-grain pasta. Vegetables Fresh or frozen vegetables. "No salt added" canned vegetables. "No salt added" tomato sauce and paste. Low-sodium  or reduced-sodium tomato and vegetable juice. Fruits Fresh, frozen, or canned fruit. Fruit juice. Meats and other protein foods Fresh or frozen (no salt added) meat, poultry, seafood, and fish. Low-sodium canned tuna and salmon. Unsalted nuts. Dried peas, beans, and lentils without added salt. Unsalted canned beans. Eggs. Unsalted nut butters. Dairy Milk. Soy milk. Cheese that is  naturally low in sodium, such as ricotta cheese, fresh mozzarella, or Swiss cheese Low-sodium or reduced-sodium cheese. Cream cheese. Yogurt. Fats and oils Unsalted butter. Unsalted margarine with no trans fat. Vegetable oils such as canola or olive oils. Seasonings and other foods Fresh and dried herbs and spices. Salt-free seasonings. Low-sodium mustard and ketchup. Sodium-free salad dressing. Sodium-free light mayonnaise. Fresh or refrigerated horseradish. Lemon juice. Vinegar. Homemade, reduced-sodium, or low-sodium soups. Unsalted popcorn and pretzels. Low-salt or salt-free chips. What foods are not recommended? The items listed may not be a complete list. Talk with your dietitian about what dietary choices are best for you. Grains Instant hot cereals. Bread stuffing, pancake, and biscuit mixes. Croutons. Seasoned rice or pasta mixes. Noodle soup cups. Boxed or frozen macaroni and cheese. Regular salted crackers. Self-rising flour. Vegetables Sauerkraut, pickled vegetables, and relishes. Olives. [place] fries. Onion rings. Regular canned vegetables (not low-sodium or reduced-sodium). Regular canned tomato sauce and paste (not low-sodium or reduced-sodium). Regular tomato and vegetable juice (not low-sodium or reduced-sodium). Frozen vegetables in sauces. Meats and other protein foods Meat or fish that is salted, canned, smoked, spiced, or pickled. Bacon, ham, sausage, hotdogs, corned beef, chipped beef, packaged lunch meats, salt pork, jerky, pickled herring, anchovies, regular canned tuna, sardines, salted nuts. Dairy Processed cheese and cheese spreads. Cheese curds. Blue cheese. Feta cheese. String cheese. Regular cottage cheese. Buttermilk. Canned milk. Fats and oils Salted butter. Regular margarine. Ghee. Bacon fat. Seasonings and other foods Onion salt, garlic salt, seasoned salt, table salt, and sea salt. Canned and packaged gravies. Worcestershire sauce. Tartar sauce. Barbecue sauce.  Teriyaki sauce. Soy sauce, including reduced-sodium. Steak sauce. Fish sauce. Oyster sauce. Cocktail sauce. Horseradish that you find on the shelf. Regular ketchup and mustard. Meat flavorings and tenderizers. Bouillon cubes. Hot sauce and Tabasco sauce. Premade or packaged marinades. Premade or packaged taco seasonings. Relishes. Regular salad dressings. Salsa. Potato and tortilla chips. Corn chips and puffs. Salted popcorn and pretzels. Canned or dried soups. Pizza. Frozen entrees and pot pies. Summary  Eating less sodium can help lower your blood pressure, reduce swelling, and protect your heart, liver, and kidneys.  Most people on this plan should limit their sodium intake to 1,500-2,000 mg (milligrams) of sodium each day.  Canned, boxed, and frozen foods are high in sodium. Restaurant foods, fast foods, and pizza are also very high in sodium. You also get sodium by adding salt to food.  Try to cook at home, eat more fresh fruits and vegetables, and eat less fast food, canned, processed, or prepared foods. This information is not intended to replace advice given to you by your health care provider. Make sure you discuss any questions you have with your health care provider. Document Revised: 12/04/2016 Document Reviewed: 12/16/2015 Elsevier Patient Education  2020 [employer].

## 2019-09-08 NOTE — Telephone Encounter (Signed)
Scheduled [facility] Palliative visit for 10-02-19 at 1:00.

## 2019-09-09 LAB — BASIC METABOLIC PANEL
BUN/Creatinine Ratio: 21 (ref 10–24)
BUN: 29 mg/dL — ABNORMAL HIGH (ref 8–27)
CO2: 29 mmol/L (ref 20–29)
Calcium: 9.3 mg/dL (ref 8.6–10.2)
Chloride: 98 mmol/L (ref 96–106)
Creatinine, Ser: 1.37 mg/dL — ABNORMAL HIGH (ref 0.76–1.27)
GFR calc Af Amer: 54 mL/min/{1.73_m2} — ABNORMAL LOW (ref >59)
GFR calc non Af Amer: 47 mL/min/{1.73_m2} — ABNORMAL LOW (ref >59)
Glucose: 69 mg/dL (ref 65–99)
Potassium: 3.7 mmol/L (ref 3.5–5.2)
Sodium: 142 mmol/L (ref 134–144)

## 2019-09-12 ENCOUNTER — Other Ambulatory Visit: Payer: Self-pay

## 2019-09-12 ENCOUNTER — Other Ambulatory Visit: Payer: Medicare HMO

## 2019-09-12 ENCOUNTER — Inpatient Hospital Stay: Admission: RE | Admit: 2019-09-12 | Payer: Medicare HMO | Source: Ambulatory Visit

## 2019-09-12 MED ORDER — FUROSEMIDE 40 MG PO TABS
40.0000 mg | ORAL_TABLET | Freq: Every day | ORAL | 11 refills | Status: DC
Start: 2019-09-12 — End: 2019-09-25

## 2019-09-25 ENCOUNTER — Other Ambulatory Visit: Payer: Self-pay | Admitting: *Deleted

## 2019-09-25 MED ORDER — FUROSEMIDE 40 MG PO TABS
40.0000 mg | ORAL_TABLET | Freq: Every day | ORAL | 11 refills | Status: DC
Start: 2019-09-25 — End: 2020-01-30

## 2019-09-25 MED ORDER — METOPROLOL SUCCINATE ER 25 MG PO TB24
25.0000 mg | ORAL_TABLET | Freq: Two times a day (BID) | ORAL | 4 refills | Status: DC
Start: 2019-09-25 — End: 2019-12-20

## 2019-10-02 ENCOUNTER — Other Ambulatory Visit: Payer: Medicare HMO | Admitting: Hospice

## 2019-10-02 ENCOUNTER — Other Ambulatory Visit: Payer: Self-pay

## 2019-10-02 DIAGNOSIS — Z515 Encounter for palliative care: Secondary | ICD-10-CM

## 2019-10-02 DIAGNOSIS — I5023 Acute on chronic systolic (congestive) heart failure: Secondary | ICD-10-CM

## 2019-10-02 NOTE — Progress Notes (Signed)
Brian Brown Note Telephone: [phone]  Fax: [phone]  PATIENT NAME: Brian Brown [place] [address] [phone] (home)  DOB: 05/12/1932 MRN: [id number]  PRIMARY CARE PROVIDER:    Seward Carol, MD,  [place] [address] [place] [address] [phone]  REFERRING PROVIDER:   Seward Carol, MD [address] [place] [place],  [place] [address] [phone]  RESPONSIBLE PARTY:  Self Phone: [phone] or [phone]/cell Call Brian Brown [phone]  Extended Emergency Contact Information Primary Emergency Contact: Brian Brown Mobile Phone: [phone] Relation: Daughter Secondary Emergency Contact: Brian Brown, Brian Brown Mobile Phone: [phone] Relation: Daughter Brian Brown - daughter [phone]  TELEHEALTH VISIT STATEMENT Due to the COVID-19 crisis, this visit was done via telephone from my office. It was initiated and consented to by this patient and/or family.  In-person visit changed to telehealth due to patient traveled to visit his daughter; he expects to be back to town next week.   RECOMMENDATIONS/PLAN:    Visit at the request of  Dr. Seward Brown for palliative Brown. Visit consisted of building trust and discussions on Palliative Medicine as specialized medical care for people living with serious illness, aimed at facilitating better quality of life through symptoms relief, assisting with advance care plan and establishing goals of care.   Discussion on the difference between Palliative and Hospice care. Palliative care and hospice have similar goals of managing symptoms, promoting comfort, improving quality of life, and maintaining a person's dignity. However, palliative care may be offered during any phase of a serious illness, while hospice care is usually offered when a person is expected to live for 6 months or less.   Patient/family verbalized understanding and affirmed that they endorse palliative services at this time.  Palliative care will continue to support patient, family, and the medical team.  Advance Care Planning:  Our advance care planning conversation included a discussion about:    The value and importance of advance care planning  Experiences with loved ones who have been seriously ill or have died  Exploration of personal, cultural or spiritual beliefs that might influence medical decisions  Exploration of goals of care in the event of a sudden injury or illness  Identification and preparation of a healthcare agent  Review and updating or creation of an  advance directive document  CODE STATUS: Reviewed the implications and ramifications of CODE STATUS.  Patient elects to be a FULL code.  He desires chest compressions and resuscitation attempts but does not want to be on mechanical life support.  Patient willing to review CODE STATUS in the future as need arises.  GOALS OF CARE: Goals of care include to maximize quality of life and symptom management.  Patient has a signed MOST form in epic.  Selections include attempt resuscitation CPR, limited additional intervention, determine use or limitations of antibiotics when infection occurs, IV fluids for defined trial.,  Feeding tube for defined trial.  Follow up Palliative Care Visit: Palliative care will continue to follow for goals of care clarification and symptom management.  Follow-up in a month.  Functional decline/Symptom Management: He was recently hospitalized for acute on chronic systolic failure, from 5/5/9741UL8/04/5362.  Chest x-ray showed hazy bilateral lung base opacities and cardiomegaly suggestive of fluid overload.  He was treated/ diuresed with IV Lasix and discharged home after being stabilized.  He continues with p.o. Lasix as ordered.  Cardiology recommends [facility]  and he continues with Toprol XL.  Echo 8/31 ejection fraction  equals to 20%.  Patient endorses weakness on mild-to-moderate exertion.  He had completed  physical therapy.  Right side hemiplegia/hemiparesis related to previous stroke persists.  He continues to ambulate with a cane/rolling walker.  Education provided on the need to use his support device for stability and help prevent a fall; rest in between activities also discussed. Patient denies shortness of breath no cough, no chest pain, no headaches no dizziness.    He reported right ankle swelling nonpitting; a significant improvement from his edema a couple of weeks ago.  Encouraged continuing with Lasix and feet elevation.  He is compliant with his compression hose.   Patient reports good appetite.  Height 5 feet 8 inches Weight 160 Ibs per Brian Brown.  Patient in no medical acuity; Brian Brown with no complaints or concerns at this time. Palliative will continue to monitor for symptom management/decline and make recommendations as needed.  Family /Caregiver/Community Supports: Patient lives at home alone ; has a god-son that sees him daily; he also has children who are involved in his care. 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   I spent One hour and 16  minutes providing this initial consultation; time includes time spent with patient/family, chart review, provider coordination,  and documentation. More than 50% of the time in this consultation was spent on coordinating communication  CHIEF COMPLAIN/HISTORY OF PRESENT ILLNESS:  Brian Brown is a 86 y.o. male with multiple medical problems including Acute on chronic systolic CHF (congestive heart failure),  PAF (paroxysmal atrial fibrillation), Coronary artery disease, HTN, hx of stroke with right right side hemiplegia/hemiparesis.  Palliative Care was asked to follow this patient by consultation request of Brian Carol, MD to help  address advance care planning and goals of care.  CODE STATUS: Full  PPS: 50%  HOSPICE ELIGIBILITY/DIAGNOSIS: TBD  PAST MEDICAL HISTORY:  Past Medical History:  Diagnosis Date  . Arthritis    degenerative joint disease  . Bleeding stomach ulcer 25 yrs ago  . CAD (coronary artery disease)    a. 10/2018 NSTEMI/Cath: LM nl, LAD 60p/m, 30d, D1 90, LCX 50ost/p, OM2 90 thrombotic, RCA 30p, 20d-->med rx.  . CKD (chronic kidney disease), stage II   . Hepatitis 1981   does not know what kind;denies jaundice  . HFrEF (heart failure with reduced ejection fraction) ([place])    a. 08/2019 Echo: EF 21%.  Marland Kitchen Hypertension   . Ischemic cardiomyopathy    a. 02/2018 Echo: EF 60-65%; b. 10/2018 Echo: EF 45-50%; c. 08/2019 Echo: EF 21%, mild LVH. RVSP 41.82mmHg. Mod BAE. Mild MR. Triv AI.  . Mild dilation of ascending aorta ([place])   . PAF (paroxysmal atrial fibrillation) ([place])    a. 10/2018 noted on Linq-->eliquis initiated (CHA2DS2VASc = 7) however d/c'd in 08/2019 2/2 cerebral bleed.  . Prostate cancer [facility]) 2002   prostate, s/p surgery  . Sleep apnea 2005   does not wear a Cpap  . Stroke [facility])    a. 2003 mild residual right leg wkns; b. 04/5807 embolic stroke, later found to have PAF.  Marland Kitchen Wears dentures    full top  . Wears glasses     SOCIAL HX:  Social History   Tobacco Use  . Smoking status: Former Smoker    Packs/day: 1.00    Years: 30.00    Pack years: 30.00  Types: Cigarettes    Quit date: 01/27/2008    Years since quitting: 11.6  . Smokeless tobacco: Never Used  Substance Use Topics  . Alcohol use: No   FAMILY HX:  Family History  Problem Relation Age of Onset  . Heart disease Mother   . Heart disease Father     ALLERGIES: No Known Allergies   PERTINENT MEDICATIONS:  Outpatient Encounter Medications as of 10/02/2019  Medication Sig  . apixaban (ELIQUIS) 5 MG TABS tablet Take 1 tablet (5 mg total) by mouth 2 (two) times daily.  Marland Kitchen atorvastatin (LIPITOR) 40 MG tablet TAKE 1  TABLET EVERY DAY (Patient taking differently: Take 40 mg by mouth every evening. )  . famotidine (PEPCID) 20 MG tablet Take 1 tablet (20 mg total) by mouth at bedtime.  . furosemide (LASIX) 40 MG tablet Take 1 tablet (40 mg total) by mouth daily. Take 1 additional tablet in the afternoons on Monday, Wednesday, and Friday  . latanoprost (XALATAN) 0.005 % ophthalmic solution Place 1 drop into both eyes at bedtime.  . metoprolol succinate (TOPROL-XL) 25 MG 24 hr tablet Take 1 tablet (25 mg total) by mouth 2 (two) times daily with a meal.  . Multiple Vitamin (MULTIVITAMIN WITH MINERALS) TABS tablet Take 1 tablet by mouth daily.  . nitroGLYCERIN (NITROSTAT) 0.4 MG SL tablet Place 1 tablet (0.4 mg total) under the tongue every 5 (five) minutes x 3 doses as needed for chest pain.  Marland Kitchen OVER THE COUNTER MEDICATION Take 2 capsules by mouth at bedtime. Omega XL  . sacubitril-valsartan (ENTRESTO) 24-26 MG Take 1 tablet by mouth 2 (two) times daily.   No facility-administered encounter medications on file as of 10/02/2019.    Teodoro Spray, NP

## 2019-10-07 ENCOUNTER — Other Ambulatory Visit: Payer: Self-pay | Admitting: Cardiology

## 2019-10-07 MED ORDER — SACUBITRIL-VALSARTAN 24-26 MG PO TABS: 1.0000 | ORAL_TABLET | Freq: Two times a day (BID) | ORAL | 0 refills | Status: DC

## 2019-10-07 MED ORDER — SACUBITRIL-VALSARTAN 24-26 MG PO TABS
1.0000 | ORAL_TABLET | Freq: Two times a day (BID) | ORAL | 0 refills | Status: DC
Start: 2019-10-07 — End: 2019-10-30

## 2019-10-07 NOTE — Progress Notes (Signed)
Patient's daughter called in reporting he is there visiting and out of his [place]oll. Asked that Rx be sent to [facility] there in [place], [place]. Rx sent for 30 day supply.

## 2019-10-30 ENCOUNTER — Other Ambulatory Visit: Payer: Self-pay | Admitting: Cardiology

## 2019-10-30 MED ORDER — SACUBITRIL-VALSARTAN 24-26 MG PO TABS
1.0000 | ORAL_TABLET | Freq: Two times a day (BID) | ORAL | 3 refills | Status: DC
Start: 2019-10-30 — End: 2020-02-20

## 2019-11-13 ENCOUNTER — Ambulatory Visit: Payer: Medicare HMO

## 2019-11-16 ENCOUNTER — Ambulatory Visit: Payer: Medicare HMO | Attending: Internal Medicine

## 2019-11-16 DIAGNOSIS — Z23 Encounter for immunization: Secondary | ICD-10-CM

## 2019-11-16 NOTE — Progress Notes (Signed)
   Covid-19 Vaccination Clinic  Name:  DAMEN WINDSOR    MRN: [id number] DOB: March 13, 1932  11/16/2019  Mr. Girardin was observed post Covid-19 immunization for 15 minutes without incident. He was provided with Vaccine Information Sheet and instruction to access the V-Safe system.   Mr. Cauble was instructed to call 911 with any severe reactions post vaccine: Marland Kitchen Difficulty breathing  . Swelling of face and throat  . A fast heartbeat  . A bad rash all over body  . Dizziness and weakness

## 2019-12-04 ENCOUNTER — Other Ambulatory Visit: Payer: Self-pay

## 2019-12-04 ENCOUNTER — Other Ambulatory Visit: Payer: Medicare HMO | Admitting: Hospice

## 2019-12-04 DIAGNOSIS — Z515 Encounter for palliative care: Secondary | ICD-10-CM

## 2019-12-04 DIAGNOSIS — I5023 Acute on chronic systolic (congestive) heart failure: Secondary | ICD-10-CM

## 2019-12-04 NOTE — Progress Notes (Signed)
[facility] Consult Note Telephone: [phone]  Fax: [phone]  PATIENT NAME: Brian Brown DOB: Oct 10, 1932 MRN: [id number]  PRIMARY CARE PROVIDER:   Seward Carol, MD Brian Carol, MD [address] [place],  [place] [address]  REFERRING PROVIDER: Seward Carol, MD Brian Brown, [place] [address] [place],  [place] [address]  RESPONSIBLE PARTY:  Self Phone: [phone] or [phone]/cell Call Brian Brown [phone]  Extended Emergency Contact Information Primary Emergency Contact: Brian Brown Mobile Phone: [phone] Relation: Brown Secondary Emergency Contact: Brian Brown Mobile Phone: [phone] Relation: Brown Brian Brown [phone]  RECOMMENDATIONS/PLAN:   Visit consisted of building trust and discussions on Palliative Medicine as specialized medical care for people living with serious illness, aimed at facilitating better quality of life through symptoms relief, assisting with advance care plan and establishing goals of care.  NP called Brian Brown and updated her on visit.  She expressed gratitude.   CODE STATUS: Patient clarified he is a Partial code. Reviewed the implications and ramifications of CODE STATUS.  He desires chest compressions and resuscitation attempts but does not want to be on mechanical life support.  GOALS OF CARE: Goals of care include to maximize quality of life and symptom management.  Patient has a signed MOST form in epic.  Selections include attempt resuscitation CPR, limited additional intervention, determine use or limitations of antibiotics when infection occurs, IV fluids for defined trial,  Feeding tube for defined trial.  Follow up Palliative Care Visit: Palliative care will continue to follow for goals of care clarification and symptom management.  Follow-up in two months.  Symptom Management:    Fatigue: Chronic. Patient had completed PT/OT after his last hospitalization 8/1/2021to8/6/202 for acute on chronic systolic failure, from .  Patient reports he is much better than before.  Education on balance of rest and performance activity related to heart limitations. Appetite is fair to good. Gets meals on wheels.  Pain: Patient denies pain/chest pain. Occasional dry cough during visit, no dyspnea, no fever or chills. Patient denies shortness of breath, no headaches no dizziness.  Also discussed cough with Brian Brown, to monitor closely and call if it worsens or patient develops pain/discomfort, fever.  OTC cough medication Mucinex 600 mg twice daily x3 days advised. Edema: Nonpitting edema to BLE. Continue Lasix as ordered.  Encouraged use of compression hose and demonstrated  elevation of extremities.  Palliative will continue to monitor for symptom management/decline and make recommendations as needed. Patient reports good appetite.  Height 5 feet 8 inches Weight 160 Ibs per Brian Brown.  Patient in no medical acuity; Brian Brown with no complaints or concerns at this time. Brian Brown and Brian Brown fixes his pill box; patient is compliant with his medications as ordered.  Palliative will continue to monitor for symptom management/decline and make recommendations as needed.  Family /Caregiver/Community Supports: Patient lives at home alone ; has a god-son Brian Brown that sees him daily; he also has children who are involved in his care. 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   I spent 50 minutes providing this consultation; time includes time spent with patient/family, chart review, provider coordination,  and documentation. More than 50% of the time in this consultation was spent on coordinating communication  CHIEF COMPLAIN/HISTORY OF PRESENT ILLNESS:  Brian Brown is a 86 y.o. male with multiple  medical problems including Acute on chronic systolic CHF (congestive heart failure), PAF (paroxysmal atrial fibrillation), Coronary artery disease, HTN, hx of stroke with right right side hemiplegia/hemiparesis.  Palliative Care was asked to follow this patient by consultation request of Brian Carol, MD to help address advance care planning and goals of care.  CODE STATUS: Full  PPS: 50%   HOSPICE ELIGIBILITY/DIAGNOSIS: TBD  PAST MEDICAL HISTORY:  Past Medical History:  Diagnosis Date  . Arthritis    degenerative joint disease  . Bleeding stomach ulcer 25 yrs ago  . CAD (coronary artery disease)    a. 10/2018 NSTEMI/Cath: LM nl, LAD 60p/m, 30d, D1 90, LCX 50ost/p, OM2 90 thrombotic, RCA 30p, 20d-->med rx.  . CKD (chronic kidney disease), stage II   . Hepatitis 1981   does not know what kind;denies jaundice  . HFrEF (heart failure with reduced ejection fraction) ([place])    a. 08/2019 Echo: EF 21%.  Marland Kitchen Hypertension   . Ischemic cardiomyopathy    a. 02/2018 Echo: EF 60-65%; b. 10/2018 Echo: EF 45-50%; c. 08/2019 Echo: EF 21%, mild LVH. RVSP 41.58mmHg. Mod BAE. Mild MR. Triv AI.  . Mild dilation of ascending aorta ([name])   . PAF (paroxysmal atrial fibrillation) ([place])    a. 10/2018 noted on Linq-->eliquis initiated (CHA2DS2VASc = 7) however d/c'd in 08/2019 2/2 cerebral bleed.  . Prostate cancer [facility]) 2002   prostate, s/p surgery  . Sleep apnea 2005   does not wear a Cpap  . Stroke [facility])    a. 2003 mild residual right leg wkns; b. 04/1738 embolic stroke, later found to have PAF.  Marland Kitchen Wears dentures    full top  . Wears glasses     SOCIAL HX:  Social History   Tobacco Use  . Smoking status: Former Smoker    Packs/day: 1.00    Years: 30.00    Pack years: 30.00    Types: Cigarettes    Quit date: 01/27/2008    Years since quitting: 11.8  . Smokeless tobacco: Never Used  Substance Use Topics  . Alcohol use: No    ALLERGIES: No Known Allergies   PERTINENT MEDICATIONS:    Outpatient Encounter Medications as of 12/04/2019  Medication Sig  . apixaban (ELIQUIS) 5 MG TABS tablet Take 1 tablet (5 mg total) by mouth 2 (two) times daily.  Marland Kitchen atorvastatin (LIPITOR) 40 MG tablet TAKE 1 TABLET EVERY DAY (Patient taking differently: Take 40 mg by mouth every evening. )  . famotidine (PEPCID) 20 MG tablet Take 1 tablet (20 mg total) by mouth at bedtime.  . furosemide (LASIX) 40 MG tablet Take 1 tablet (40 mg total) by mouth daily. Take 1 additional tablet in the afternoons on Monday, Wednesday, and Friday  . latanoprost (XALATAN) 0.005 % ophthalmic solution Place 1 drop into both eyes at bedtime.  . metoprolol succinate (TOPROL-XL) 25 MG 24 hr tablet Take 1 tablet (25 mg total) by mouth 2 (two) times daily with a meal.  . Multiple Vitamin (MULTIVITAMIN WITH MINERALS) TABS tablet Take 1 tablet by mouth daily.  . nitroGLYCERIN (NITROSTAT) 0.4 MG SL tablet Place 1 tablet (0.4 mg total) under the tongue every 5 (five) minutes x 3 doses as needed for chest pain.  Marland Kitchen OVER THE COUNTER MEDICATION Take 2 capsules by mouth at bedtime. Omega XL  . sacubitril-valsartan (ENTRESTO) 24-26 MG Take 1 tablet by mouth 2 (two) times daily.   No facility-administered encounter medications on file as of 12/04/2019.    PHYSICAL EXAM/ROS:  BP  132/80 P 78 02 99% RA T 97.65F R 18 Height 5 feet 9 inches Weight 160 Lbs per patient; confirmed by Brian Brown.  General: NAD, frail appearing, cooperative Cardiovascular: regular rate and rhythm; denies chest pain Pulmonary:  upper lobes rhonchous sounds that cleared with cough followed with clear lung sounds in all lobes.  Abdomen: soft, nontender, + bowel sounds GU: no suprapubic tenderness Extremities: non pitting edema to BLE  Skin: no rashes to visible skin. Neurological: Weakness but otherwise nonfocal.  Teodoro Spray, NP

## 2019-12-08 ENCOUNTER — Other Ambulatory Visit: Payer: Self-pay | Admitting: Interventional Cardiology

## 2019-12-08 NOTE — Telephone Encounter (Signed)
Prescription refill request for Eliquis received. Indiaction: Afib Last office visit: [place], 09/08/2019 Scr: 1.37, 09/08/2019 Age: 86 yo Weight: 72 kg   Prescription refill sent.

## 2019-12-19 ENCOUNTER — Other Ambulatory Visit: Payer: Self-pay | Admitting: Interventional Cardiology

## 2020-01-07 NOTE — Progress Notes (Deleted)
Cardiology Office Note   Date:  01/07/2020   ID:  Brian Brown, DOB [birth year], MRN [id number]  PCP:  Renford Dills, MD    No chief complaint on file.  CAD  Wt Readings from Last 3 Encounters:  09/08/19 158 lb 12.8 oz (72 kg)  09/07/19 154 lb 12.8 oz (70.2 kg)  08/11/19 164 lb 7.4 oz (74.6 kg)       History of Present Illness: Brian Brown is a 87 y.o. male  Who had stroke in 02/2018 and NSTEMI in 10/2018.  Cath showed: "Cath: 10/19/18   The left ventricular systolic function is normal.  LV end diastolic pressure is normal.  The left ventricular ejection fraction is 55-65% by visual estimate.  Prox RCA lesion is 30% stenosed.  Dist RCA lesion is 20% stenosed.  Ost Cx to Prox Cx lesion is 50% stenosed.  Prox LAD to Mid LAD lesion is 60% stenosed.  1st Diag lesion is 90% stenosed.  Dist LAD lesion is 30% stenosed.  2nd Mrg lesion is 90% stenosed.  1. Severe one-vessel coronary artery disease with subtotal thrombotic occlusion of the distal OM2 at the trifurcation of 3 terminal branches. The vessel in this area is 1.5 mm in diameter and at a trifurcation and thus not suitable for PCI. The vessel is also very tortuous. There is also moderate ostial left circumflex and proximal LAD disease. 2. Normal LV systolic function and left ventricular end-diastolic pressure.  Recommendations: Recommend aggressive medical therapy. Eliquis can be resumed tonight. I favor that he continues to take an antiplatelet medication given this thrombotic event. He seems to be already on Plavix which should be continued."  Echo showed EF 45%-50%.  LINQ showed AFib in 10/2018. Discharged on Eliquis and Plavix.  In 2021, he was not very physically active.  He did have a fall in early 2021.   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 He was recently hospitalized for acute on chronic systolic failure, from 8/1/2021to8/06/2019. His left ventricular ejection fraction has decreased20%. Hospital course was also complicated by acute mental status change and he was found to have right lateral thalamic intracranial hemorrhage. At that point his Eliquis and Plavix were held but continue to take aspirin as per neurology.   On arrival to ED he was hypotensive and hypoxic requiring about 2 L of nasal cannula oxygen. Chest x-ray showed hazy bilateral lung base opacities and cardiomegaly suggestive of fluid overload. He was started on IV Lasix for diuresis and cardiology consulted for recommendations. Neurology suggest repeating another CT of the head without contrast today and to resume Eliquis. MRI of the brain and CT of the head today shows stable right thalamic intracranial hemorrhage. Cardiology recommends to continue with oral Lasix and that patient is euvolemic. Repeat chest x-ray showed persistent bilateral opacities. PT evaluation recommending home health PT and supervision for mobility. Patient seen and examined this morning he appears to be confused, slight visual hallucinations.  ECHO 8/31>EF is 20% with global hypokinesis - he has been adequately diuresed and has been transitioned to oral Lasix - Cardiology recommends Entresto and has started it today - cont Toprol XL - palliative care will follow at home  Dementia - he has  confusion which the family states it permanent- will need to continue 24 hr care at home  A-fib, paroxsymal, - cont Eliquis and Toprol  h/o CVA- recent ICH On Eliquis and Lipitor- aspirin has been stopped"  Discussed ELiquis with family at  the 9/21 visit as well.  Delene Loll was also started.   In 11/21, palliative care made a home visit.    Past Medical History:  Diagnosis Date  . Arthritis    degenerative joint disease  . Bleeding stomach ulcer 25 yrs ago  . CAD (coronary artery disease)    a. 10/2018 NSTEMI/Cath: LM nl, LAD 60p/m, 30d, D1 90, LCX 50ost/p, OM2 90 thrombotic, RCA 30p, 20d-->med rx.  . CKD (chronic kidney disease), stage II   . Hepatitis 1981   does not know what kind;denies jaundice  . HFrEF (heart failure with reduced ejection fraction) ([place])    a. 08/2019 Echo: EF 21%.  Marland Kitchen Hypertension   . Ischemic cardiomyopathy    a. 02/2018 Echo: EF 60-65%; b. 10/2018 Echo: EF 45-50%; c. 08/2019 Echo: EF 21%, mild LVH. RVSP 41.41mmHg. Mod BAE. Mild MR. Triv AI.  . Mild dilation of ascending aorta ([place])   . PAF (paroxysmal atrial fibrillation) ([place])    a. 10/2018 noted on Linq-->eliquis initiated (CHA2DS2VASc = 7) however d/c'd in 08/2019 2/2 cerebral bleed.  . Prostate cancer [facility]) 2002   prostate, s/p surgery  . Sleep apnea 2005   does not wear a Cpap  . Stroke [facility])    a. 2003 mild residual right leg wkns; b. [id number] embolic stroke, later found to have PAF.  Marland Kitchen Wears dentures    full top  . Wears glasses     Past Surgical History:  Procedure Laterality Date  . BUBBLE STUDY  09/16/2018   Procedure: BUBBLE STUDY;  Surgeon: Lelon Perla, MD;  Location: [facility];  Service: Cardiovascular;;  . CARDIAC CATHETERIZATION  10/19/2018  . CARPAL TUNNEL RELEASE Left 03/29/2013   Procedure: LEFT CARPAL TUNNEL RELEASE;  Surgeon: Wynonia Sours, MD;  Location: [facility];  Service: Orthopedics;  Laterality: Left;  ANESTHESIA: IV REGIONAL FAB  . CARPAL TUNNEL RELEASE Right 03/06/2014   Procedure: RIGHT CARPAL TUNNEL RELEASE;  Surgeon: Daryll Brod, MD;  Location: [facility];  Service: Orthopedics;  Laterality: Right;  . COLONOSCOPY    . HERNIA REPAIR  2009   rt inguinal hernia  . JOINT  REPLACEMENT  12/2001   rt hip ;[place] CATH AND CORONARY ANGIOGRAPHY N/A 10/19/2018   Procedure: LEFT HEART CATH AND CORONARY ANGIOGRAPHY;  Surgeon: Wellington Hampshire, MD;  Location: [facility];  Service: Cardiovascular;  Laterality: N/A;  . LOOP RECORDER INSERTION N/A 09/16/2018   Procedure: LOOP RECORDER INSERTION;  Surgeon: Constance Haw, MD;  Location: [facility];  Service: Cardiovascular;  Laterality: N/A;  . PROSTATECTOMY  2002  . SHOULDER ARTHROSCOPY W/ ROTATOR CUFF REPAIR  2014   lt-dr whitfield  . TEE WITHOUT CARDIOVERSION N/A 09/16/2018   Procedure: TRANSESOPHAGEAL ECHOCARDIOGRAM (TEE);  Surgeon: Lelon Perla, MD;  Location: [facility];  Service: Cardiovascular;  Laterality: N/A;  . TOTAL HIP ARTHROPLASTY  02/03/2011   Procedure: TOTAL HIP ARTHROPLASTY;  Surgeon: Garald Balding, MD;  Location: [facility];  Service: Orthopedics;  Laterality: Left;  . TRIGGER FINGER RELEASE Left 03/29/2013   Procedure: LEFT MIDDLE/SMALL FINGER RELEASE A-1 PULLEY AND LEFT RING FINGER;  Surgeon: Wynonia Sours, MD;  Location: [facility];  Service: Orthopedics;  Laterality: Left;  . TRIGGER FINGER RELEASE Right 03/06/2014   Procedure: RIGHT MIDDLE FINGER,RIGHT RINGER FINGER;  Surgeon: Cindee Salt, MD;  Location: [facility];  Service: Orthopedics;  Laterality: Right;     Current Outpatient Medications  Medication Sig Dispense Refill  . atorvastatin (LIPITOR) 40 MG tablet TAKE 1 TABLET EVERY DAY (Patient taking differently: Take 40 mg by mouth every evening. ) 90 tablet 2  . ELIQUIS 5 MG TABS tablet TAKE 1 TABLET BY MOUTH TWICE A DAY 60 tablet 5  . famotidine (PEPCID) 20 MG tablet Take 1 tablet (20 mg total) by mouth at bedtime. 303 tablet 0  . furosemide (LASIX) 40 MG tablet Take 1 tablet (40 mg total) by mouth daily. Take 1 additional tablet in the afternoons on Monday, Wednesday, and Friday 42 tablet 11  . latanoprost (XALATAN)  0.005 % ophthalmic solution Place 1 drop into both eyes at bedtime.    . metoprolol succinate (TOPROL-XL) 25 MG 24 hr tablet TAKE 1 TABLET (25 MG TOTAL) BY MOUTH 2 (TWO) TIMES DAILY WITH A MEAL. 180 tablet 2  . Multiple Vitamin (MULTIVITAMIN WITH MINERALS) TABS tablet Take 1 tablet by mouth daily. 30 tablet 0  . nitroGLYCERIN (NITROSTAT) 0.4 MG SL tablet Place 1 tablet (0.4 mg total) under the tongue every 5 (five) minutes x 3 doses as needed for chest pain. 25 tablet 6  . OVER THE COUNTER MEDICATION Take 2 capsules by mouth at bedtime. Omega XL    . sacubitril-valsartan (ENTRESTO) 24-26 MG Take 1 tablet by mouth 2 (two) times daily. 180 tablet 3   No current facility-administered medications for this visit.    Allergies:   Patient has no known allergies.    Social History:  The patient  reports that he quit smoking about 11 years ago. His smoking use included cigarettes. He has a 30.00 pack-year smoking history. He has never used smokeless tobacco. He reports that he does not drink alcohol and does not use drugs.   Family History:  The patient's ***family history includes Heart disease in his father and mother.    ROS:  Please see the history of present illness.   Otherwise, review of systems are positive for ***.   All other systems are reviewed and negative.    PHYSICAL EXAM: VS:  There were no vitals taken for this visit. , BMI There is no height or weight on file to calculate BMI. GEN: Well nourished, well developed, in no acute distress  HEENT: normal  Neck: no JVD, carotid bruits, or masses Cardiac: ***RRR; no murmurs, rubs, or gallops,no edema  Respiratory:  clear to auscultation bilaterally, normal work of breathing GI: soft, nontender, nondistended, + BS MS: no deformity or atrophy  Skin: warm and dry, no rash Neuro:  Strength and sensation are intact Psych: euthymic mood, full affect   EKG:   The ekg ordered today demonstrates ***   Recent Labs: 08/31/2019: ALT 41; B  Natriuretic Peptide 1,344.2 09/06/2019: Magnesium 1.9 09/07/2019: Hemoglobin 14.0; Platelets 214 09/08/2019: BUN 29; Creatinine, Ser 1.37; Potassium 3.7; Sodium 142   Lipid Panel    Component Value Date/Time   CHOL 120 08/09/2019 0417   TRIG 44 08/09/2019 0417   HDL 41 08/09/2019 0417   CHOLHDL 2.9 08/09/2019 0417   VLDL 9 08/09/2019 0417   LDLCALC 70 08/09/2019 0417     Other studies Reviewed: Additional studies/ records that were reviewed today with results demonstrating: ***.  ASSESSMENT AND PLAN:  1. CAD: medical treatment.  Branch vessel disease in 2020.  2. PAF: Eliquis for stroke prevention.  3. Chronic systolic heart failure: Acute change in EF from 2020 to 2021,  EF dropped to 20%.  Entresto.  4. CVA: prior thalamic hemorrhage.   5. Agree with palliative care approach and symptom management.    Current medicines are reviewed at length with the patient today.  The patient concerns regarding his medicines were addressed.  The following changes have been made:  No change***  Labs/ tests ordered today include: *** No orders of the defined types were placed in this encounter.   Recommend 150 minutes/week of aerobic exercise Low fat, low carb, high fiber diet recommended  Disposition:   FU in ***   Signed, Larae Grooms, MD  01/07/2020 1:58 PM    [facility] [address], [place], [place]  [address] Phone: [phone]; Fax: [phone]

## 2020-01-08 ENCOUNTER — Ambulatory Visit: Payer: Medicare HMO | Admitting: Interventional Cardiology

## 2020-01-08 DIAGNOSIS — I480 Paroxysmal atrial fibrillation: Secondary | ICD-10-CM

## 2020-01-08 DIAGNOSIS — I10 Essential (primary) hypertension: Secondary | ICD-10-CM

## 2020-01-08 DIAGNOSIS — I5022 Chronic systolic (congestive) heart failure: Secondary | ICD-10-CM

## 2020-01-08 DIAGNOSIS — I63432 Cerebral infarction due to embolism of left posterior cerebral artery: Secondary | ICD-10-CM

## 2020-01-08 DIAGNOSIS — I2510 Atherosclerotic heart disease of native coronary artery without angina pectoris: Secondary | ICD-10-CM

## 2020-01-09 ENCOUNTER — Telehealth: Payer: Self-pay | Admitting: Interventional Cardiology

## 2020-01-09 NOTE — Progress Notes (Signed)
Cardiology Office Note   Date:  01/10/2020   ID:  Brian Brown, DOB 1932/06/04, MRN [id number]  PCP:  Seward Carol, MD    No chief complaint on file.  CAD  Wt Readings from Last 3 Encounters:  01/10/20 185 lb (83.9 kg)  09/08/19 158 lb 12.8 oz (72 kg)  09/07/19 154 lb 12.8 oz (70.2 kg)       History of Present Illness: Brian Brown is a 87 y.o. male  Who had stroke in 02/2018 and NSTEMI in 10/2018.  Cath showed: "Cath: 10/19/18   The left ventricular systolic function is normal.  LV end diastolic pressure is normal.  The left ventricular ejection fraction is 55-65% by visual estimate.  Prox RCA lesion is 30% stenosed.  Dist RCA lesion is 20% stenosed.  Ost Cx to Prox Cx lesion is 50% stenosed.  Prox LAD to Mid LAD lesion is 60% stenosed.  1st Diag lesion is 90% stenosed.  Dist LAD lesion is 30% stenosed.  2nd Mrg lesion is 90% stenosed.  1. Severe one-vessel coronary artery disease with subtotal thrombotic occlusion of the distal OM2 at the trifurcation of 3 terminal branches. The vessel in this area is 1.5 mm in diameter and at a trifurcation and thus not suitable for PCI. The vessel is also very tortuous. There is also moderate ostial left circumflex and proximal LAD disease. 2. Normal LV systolic function and left ventricular end-diastolic pressure.  Recommendations: Recommend aggressive medical therapy. Eliquis can be resumed tonight. I favor that he continues to take an antiplatelet medication given this thrombotic event. He seems to be already on Plavix which should be continued."  Echo showed EF 45%-50%.  LINQ showed AFib in 10/2018. Discharged on Eliquis and Plavix.  In 2021, he was not very physically active.  He did have a fall in early 2021.   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 He was recently hospitalized for acute on chronic systolic failure, from [id number]. His left ventricular ejection fraction has decreased20%. Hospital course was also complicated by acute mental status change and he was found to have right lateral thalamic intracranial hemorrhage. At that point his Eliquis and Plavix were held but continue to take aspirin as per neurology.   On arrival to ED he was hypotensive and hypoxic requiring about 2 L of nasal cannula oxygen. Chest x-ray showed hazy bilateral lung base opacities and cardiomegaly suggestive of fluid overload. He was started on IV Lasix for diuresis and cardiology consulted for recommendations. Neurology suggest repeating another CT of the head without contrast today and to resume Eliquis. MRI of the brain and CT of the head today shows stable right thalamic intracranial hemorrhage. Cardiology recommends to continue with oral Lasix and that patient is euvolemic. Repeat chest x-ray showed persistent bilateral opacities. PT evaluation recommending home health PT and supervision for mobility. Patient seen and examined this morning he appears to be confused, slight visual hallucinations.  ECHO 8/31>EF is 20% with global hypokinesis - he has been adequately diuresed and has been transitioned to oral Lasix - Cardiology recommends Entresto and has started it today - cont Toprol XL - palliative care will follow at home  Dementia - he has confusion which  the family states it permanent- will need to continue 24 hr care at home  A-fib, paroxsymal, - cont Eliquis and Toprol  h/o CVA- recent ICH On Eliquis and Lipitor- aspirin has been stopped"  Discussed ELiquis with family at the 9/21 visit as  well.  Delene Loll was also started.   In 11/21, palliative care made a home visit.    Past Medical History:  Diagnosis Date  . Arthritis    degenerative joint disease  . Bleeding stomach ulcer 25 yrs ago  . CAD (coronary artery disease)    a. 10/2018 NSTEMI/Cath: LM nl, LAD 60p/m, 30d, D1 90, LCX 50ost/p, OM2 90 thrombotic, RCA 30p, 20d-->med rx.  . CKD (chronic kidney disease), stage II   . Hepatitis 1981   does not know what kind;denies jaundice  . HFrEF (heart failure with reduced ejection fraction) ([place])    a. 08/2019 Echo: EF 21%.  Marland Kitchen Hypertension   . Ischemic cardiomyopathy    a. 02/2018 Echo: EF 60-65%; b. 10/2018 Echo: EF 45-50%; c. 08/2019 Echo: EF 21%, mild LVH. RVSP 41.8mmHg. Mod BAE. Mild MR. Triv AI.  . Mild dilation of ascending aorta ([place])   . PAF (paroxysmal atrial fibrillation) ([place])    a. 10/2018 noted on Linq-->eliquis initiated (CHA2DS2VASc = 7) however d/c'd in 08/2019 2/2 cerebral bleed.  . Prostate cancer [facility]) 2002   prostate, s/p surgery  . Sleep apnea 2005   does not wear a Cpap  . Stroke [facility])    a. 2003 mild residual right leg wkns; b. [id number] embolic stroke, later found to have PAF.  Marland Kitchen Wears dentures    full top  . Wears glasses     Past Surgical History:  Procedure Laterality Date  . BUBBLE STUDY  09/16/2018   Procedure: BUBBLE STUDY;  Surgeon: Lelon Perla, MD;  Location: [facility];  Service: Cardiovascular;;  . CARDIAC CATHETERIZATION  10/19/2018  . CARPAL TUNNEL RELEASE Left 03/29/2013   Procedure: LEFT CARPAL TUNNEL RELEASE;  Surgeon: Wynonia Sours, MD;  Location: [place] [facility];  Service: Orthopedics;  Laterality: Left;  ANESTHESIA: IV REGIONAL FAB  . CARPAL TUNNEL RELEASE Right 03/06/2014   Procedure: RIGHT CARPAL TUNNEL RELEASE;  Surgeon: Daryll Brod, MD;  Location: [facility];  Service: Orthopedics;  Laterality: Right;  . COLONOSCOPY    . HERNIA REPAIR  2009   rt inguinal hernia  . JOINT REPLACEMENT  12/2001   rt  hip ;[place] CATH AND CORONARY ANGIOGRAPHY N/A 10/19/2018   Procedure: LEFT HEART CATH AND CORONARY ANGIOGRAPHY;  Surgeon: Wellington Hampshire, MD;  Location: [place] [facility];  Service: Cardiovascular;  Laterality: N/A;  . LOOP RECORDER INSERTION N/A 09/16/2018   Procedure: LOOP RECORDER INSERTION;  Surgeon: Constance Haw, MD;  Location: [place] [facility];  Service: Cardiovascular;  Laterality: N/A;  . PROSTATECTOMY  2002  . SHOULDER ARTHROSCOPY W/ ROTATOR CUFF REPAIR  2014   lt-dr whitfield  . TEE WITHOUT CARDIOVERSION N/A 09/16/2018   Procedure: TRANSESOPHAGEAL ECHOCARDIOGRAM (TEE);  Surgeon: Lelon Perla, MD;  Location: [facility];  Service: Cardiovascular;  Laterality: N/A;  . TOTAL HIP ARTHROPLASTY  02/03/2011   Procedure: TOTAL HIP ARTHROPLASTY;  Surgeon: Garald Balding, MD;  Location: [facility];  Service: Orthopedics;  Laterality: Left;  . TRIGGER FINGER RELEASE Left 03/29/2013   Procedure: LEFT MIDDLE/SMALL FINGER RELEASE A-1 PULLEY AND LEFT RING FINGER;  Surgeon: Wynonia Sours, MD;  Location: [place];  Service: Orthopedics;  Laterality: Left;  . TRIGGER FINGER RELEASE Right 03/06/2014   Procedure: RIGHT MIDDLE FINGER,RIGHT RINGER FINGER;  Surgeon: Cindee Salt, MD;  Location: [facility];  Service: Orthopedics;  Laterality: Right;     Current Outpatient Medications  Medication Sig Dispense Refill  . atorvastatin (LIPITOR) 40 MG tablet TAKE 1 TABLET EVERY DAY (Patient taking differently: Take 40 mg by mouth every evening.) 90 tablet 2  . ELIQUIS 5 MG TABS tablet TAKE 1 TABLET BY MOUTH TWICE A DAY 60 tablet 5  . famotidine (PEPCID) 20 MG tablet Take 1 tablet (20 mg total) by mouth at bedtime. 303 tablet 0  . furosemide (LASIX) 40 MG tablet Take 1 tablet (40 mg total) by mouth daily. Take 1 additional tablet in the afternoons on Monday, Wednesday, and Friday 42 tablet 11  . latanoprost (XALATAN) 0.005 % ophthalmic solution  Place 1 drop into both eyes at bedtime.    . metoprolol succinate (TOPROL-XL) 25 MG 24 hr tablet TAKE 1 TABLET (25 MG TOTAL) BY MOUTH 2 (TWO) TIMES DAILY WITH A MEAL. 180 tablet 2  . Multiple Vitamin (MULTIVITAMIN WITH MINERALS) TABS tablet Take 1 tablet by mouth daily. 30 tablet 0  . nitroGLYCERIN (NITROSTAT) 0.4 MG SL tablet Place 1 tablet (0.4 mg total) under the tongue every 5 (five) minutes x 3 doses as needed for chest pain. 25 tablet 6  . OVER THE COUNTER MEDICATION Take 2 capsules by mouth at bedtime. Omega XL    . sacubitril-valsartan (ENTRESTO) 24-26 MG Take 1 tablet by mouth 2 (two) times daily. 180 tablet 3   No current facility-administered medications for this visit.    Allergies:   Patient has no known allergies.    Social History:  The patient  reports that he quit smoking about 11 years ago. His smoking use included cigarettes. He has a 30.00 pack-year smoking history. He has never used smokeless tobacco. He reports that he does not drink alcohol and does not use drugs.   Family History:  The patient's family history includes Heart disease in his father and mother.    ROS:  Please see the history of present illness.   Otherwise, review of systems are positive for memory issues.   All other systems are reviewed and negative.    PHYSICAL EXAM: VS:  BP 110/82   Pulse 91   Ht 5\' 9"  (1.753 m)   Wt 185 lb (83.9 kg)   BMI 27.32 kg/m  , BMI Body mass index is 27.32 kg/m. GEN: Well nourished, well developed, in no acute distress  HEENT: normal  Neck: no JVD, carotid bruits, or masses Cardiac: RRR; no murmurs, rubs, or gallops,; 3+ right leg edema, 2+ left leg edema  Respiratory:  clear to auscultation bilaterally, normal work of breathing GI: soft, nontender, nondistended, + BS MS: no deformity or atrophy  Skin: warm and dry, no rash Neuro:  Strength and sensation are intact Psych: euthymic mood, full affect   Recent Labs: 08/31/2019: ALT 41; B Natriuretic Peptide  1,344.2 09/06/2019: Magnesium 1.9 09/07/2019: Hemoglobin 14.0; Platelets 214 09/08/2019: BUN 29; Creatinine, Ser 1.37; Potassium 3.7; Sodium 142   Lipid Panel    Component Value Date/Time   CHOL 120 08/09/2019 0417   TRIG 44 08/09/2019 0417   HDL 41 08/09/2019 0417   CHOLHDL 2.9 08/09/2019 0417   VLDL 9 08/09/2019 0417   LDLCALC 70 08/09/2019 0417     Other studies Reviewed: Additional studies/ records that were reviewed today  with results demonstrating: palliative note and labs reviewed.   ASSESSMENT AND PLAN:  1. CAD: medical treatment.  Branch vessel disease in 2020.  No angina.  2. PAF: Eliquis for stroke prevention.  No bleeding. Check CBC.  3. Chronic systolic heart failure: Acute change in EF from 2020 to 2021,  EF dropped to 20%.  Entresto.  4. CVA: prior thalamic hemorrhage.   5. Agree with palliative care approach and symptom management. Some dementia as well.  Spoke with daughter by phone and God son in person.  He does not want to move to [place] despite his daughters' requests.   Current medicines are reviewed at length with the patient today.  The patient concerns regarding his medicines were addressed.  The following changes have been made:  No change  Labs/ tests ordered today include: CBC and BMet No orders of the defined types were placed in this encounter.   Recommend 150 minutes/week of aerobic exercise Low fat, low carb, high fiber diet recommended  Disposition:   FU in 6 months   Signed, Larae Grooms, MD  01/10/2020 3:06 PM    [facility] [address], [place], [place]  [address] Phone: [phone]; Fax: [phone]

## 2020-01-09 NOTE — Telephone Encounter (Signed)
Rescheduled patient for 01/10/19 at 2:40 pm with Dr. Eldridge Dace

## 2020-01-10 ENCOUNTER — Encounter: Payer: Self-pay | Admitting: Interventional Cardiology

## 2020-01-10 ENCOUNTER — Ambulatory Visit: Payer: Medicare HMO | Admitting: Interventional Cardiology

## 2020-01-10 ENCOUNTER — Other Ambulatory Visit: Payer: Self-pay

## 2020-01-10 VITALS — BP 110/82 | HR 91 | Ht 69.0 in | Wt 185.0 lb

## 2020-01-10 DIAGNOSIS — I2510 Atherosclerotic heart disease of native coronary artery without angina pectoris: Secondary | ICD-10-CM | POA: Diagnosis not present

## 2020-01-10 DIAGNOSIS — I63432 Cerebral infarction due to embolism of left posterior cerebral artery: Secondary | ICD-10-CM | POA: Diagnosis not present

## 2020-01-10 DIAGNOSIS — I5022 Chronic systolic (congestive) heart failure: Secondary | ICD-10-CM

## 2020-01-10 DIAGNOSIS — I480 Paroxysmal atrial fibrillation: Secondary | ICD-10-CM

## 2020-01-10 NOTE — Patient Instructions (Signed)
Medication Instructions:  Your physician recommends that you continue on your current medications as directed. Please refer to the Current Medication list given to you today.  *If you need a refill on your cardiac medications before your next appointment, please call your pharmacy*   Lab Work: TODAY: CBC, BMET  If you have labs (blood work) drawn today and your tests are completely normal, you will receive your results only by: Marland Kitchen MyChart Message (if you have MyChart) OR . A paper copy in the mail If you have any lab test that is abnormal or we need to change your treatment, we will call you to review the results.   Testing/Procedures: None   Follow-Up: At [facility], you and your health needs are our priority.  As part of our continuing mission to provide you with exceptional heart care, we have created designated Provider Care Teams.  These Care Teams include your primary Cardiologist (physician) and Advanced Practice Providers (APPs -  Physician Assistants and Nurse Practitioners) who all work together to provide you with the care you need, when you need it.  We recommend signing up for the patient portal called "MyChart".  Sign up information is provided on this After Visit Summary.  MyChart is used to connect with patients for Virtual Visits (Telemedicine).  Patients are able to view lab/test results, encounter notes, upcoming appointments, etc.  Non-urgent messages can be sent to your provider as well.   To learn more about what you can do with MyChart, go to [email or web address].    Your next appointment:   6 month(s)  The format for your next appointment:   In Person  Provider:   You may see Lance Muss, MD or one of the following Advanced Practice Providers on your designated Care Team:    Ronie Spies, PA-C  Jacolyn Reedy, PA-C    Other Instructions None

## 2020-01-11 DIAGNOSIS — I25810 Atherosclerosis of coronary artery bypass graft(s) without angina pectoris: Secondary | ICD-10-CM | POA: Diagnosis not present

## 2020-01-11 DIAGNOSIS — I480 Paroxysmal atrial fibrillation: Secondary | ICD-10-CM | POA: Diagnosis not present

## 2020-01-11 DIAGNOSIS — I10 Essential (primary) hypertension: Secondary | ICD-10-CM | POA: Diagnosis not present

## 2020-01-11 DIAGNOSIS — I214 Non-ST elevation (NSTEMI) myocardial infarction: Secondary | ICD-10-CM | POA: Diagnosis not present

## 2020-01-11 DIAGNOSIS — I5020 Unspecified systolic (congestive) heart failure: Secondary | ICD-10-CM | POA: Diagnosis not present

## 2020-01-11 DIAGNOSIS — M1990 Unspecified osteoarthritis, unspecified site: Secondary | ICD-10-CM | POA: Diagnosis not present

## 2020-01-11 DIAGNOSIS — E785 Hyperlipidemia, unspecified: Secondary | ICD-10-CM | POA: Diagnosis not present

## 2020-01-11 DIAGNOSIS — Z8546 Personal history of malignant neoplasm of prostate: Secondary | ICD-10-CM | POA: Diagnosis not present

## 2020-01-11 DIAGNOSIS — I509 Heart failure, unspecified: Secondary | ICD-10-CM | POA: Diagnosis not present

## 2020-01-11 LAB — CBC
Hematocrit: 42.1 % (ref 37.5–51.0)
Hemoglobin: 13.9 g/dL (ref 13.0–17.7)
MCH: 29.6 pg (ref 26.6–33.0)
MCHC: 33 g/dL (ref 31.5–35.7)
MCV: 90 fL (ref 79–97)
Platelets: 177 10*3/uL (ref 150–450)
RBC: 4.7 x10E6/uL (ref 4.14–5.80)
RDW: 13.3 % (ref 11.6–15.4)
WBC: 6.6 10*3/uL (ref 3.4–10.8)

## 2020-01-11 LAB — BASIC METABOLIC PANEL
BUN/Creatinine Ratio: 15 (ref 10–24)
BUN: 23 mg/dL (ref 8–27)
CO2: 27 mmol/L (ref 20–29)
Calcium: 9 mg/dL (ref 8.6–10.2)
Chloride: 104 mmol/L (ref 96–106)
Creatinine, Ser: 1.51 mg/dL — ABNORMAL HIGH (ref 0.76–1.27)
GFR calc Af Amer: 48 mL/min/{1.73_m2} — ABNORMAL LOW (ref >59)
GFR calc non Af Amer: 42 mL/min/{1.73_m2} — ABNORMAL LOW (ref >59)
Glucose: 90 mg/dL (ref 65–99)
Potassium: 3.5 mmol/L (ref 3.5–5.2)
Sodium: 143 mmol/L (ref 134–144)

## 2020-01-18 ENCOUNTER — Telehealth: Payer: Self-pay | Admitting: Interventional Cardiology

## 2020-01-18 NOTE — Telephone Encounter (Signed)
Patient's daughter calling for lab results. °

## 2020-01-19 NOTE — Telephone Encounter (Signed)
The patient has been notified of the result and verbalized understanding.  All questions (if any) were answered. Cleon Gustin, RN 01/19/2020 12:05 PM

## 2020-01-19 NOTE — Telephone Encounter (Signed)
-----   Message from Jettie Booze, MD sent at 01/12/2020  3:23 PM EST ----- Renal function stable.  WOuld continue with the current Lasix dose.  Elevate legs as much as possible.  Potassium is borderline so I do not want to increase Lasix.  Note was sent to Dr. Delfina Redwood to see about home health to help with med compliance.

## 2020-01-30 ENCOUNTER — Telehealth: Payer: Self-pay | Admitting: Interventional Cardiology

## 2020-01-30 MED ORDER — FUROSEMIDE 40 MG PO TABS: 40.0000 mg | ORAL_TABLET | Freq: Every day | ORAL | 11 refills | Status: DC

## 2020-01-30 NOTE — Telephone Encounter (Signed)
I spoke with patient's daughter and gave her instructions from Dr Irish Lack.  She reports patient is already taking additional tablet in the afternoons on Mon, Wed, Fri. Will check with Dr Irish Lack regarding possible change

## 2020-01-30 NOTE — Telephone Encounter (Signed)
I agree with leg elevation.  Stress taking furosemide as prescribed with 2 doses daily three days a week.   JV

## 2020-01-30 NOTE — Telephone Encounter (Signed)
Patient's daughter notified. New prescription sent to [facility] on [address]

## 2020-01-30 NOTE — Telephone Encounter (Signed)
I spoke with patient's daughter. She reports patient called her this morning and complained of legs stinging and aching.  States patient always has swelling in his legs.  Does not elevate legs as much as he should.  Does not weigh daily but does have scales at home.  Patient does eat a lot of salt.  Is taking Lasix 40 mg daily.  Daughter then connected patient to the conversation.  Patient reports he thinks legs are more swollen this morning than usual.  He is not having any shortness of breath and feels breathing has improved. Denies recent increased salt intake.  Has trouble keeping legs elevated when at home alone during the day. I discussed leg elevation with patient and his daughter.  Also discussed daily weights and patient will begin weighing daily. Will forward to Dr Irish Lack for review/recommendations regarding increased swelling today

## 2020-01-30 NOTE — Telephone Encounter (Signed)
Pt c/o swelling: STAT is pt has developed SOB within 24 hours  1) How much weight have you gained and in what time span? Not sure  2) If swelling, where is the swelling located? Both legs   3) Are you currently taking a fluid pill? Yes   4) Are you currently SOB? No   5) Do you have a log of your daily weights (if so, list)? No   6) Have you gained 3 pounds in a day or 5 pounds in a week? Not sure  7) Have you traveled recently? No

## 2020-01-30 NOTE — Telephone Encounter (Signed)
If swelling persists despite more leg elevation, would increase to BID Lasix 4 days a week.   JV

## 2020-02-05 ENCOUNTER — Telehealth: Payer: Self-pay | Admitting: Interventional Cardiology

## 2020-02-05 DIAGNOSIS — Z79899 Other long term (current) drug therapy: Secondary | ICD-10-CM

## 2020-02-05 DIAGNOSIS — I5022 Chronic systolic (congestive) heart failure: Secondary | ICD-10-CM

## 2020-02-05 DIAGNOSIS — R600 Localized edema: Secondary | ICD-10-CM

## 2020-02-05 MED ORDER — FUROSEMIDE 40 MG PO TABS: 40.0000 mg | ORAL_TABLET | Freq: Two times a day (BID) | ORAL | 3 refills | Status: DC

## 2020-02-05 NOTE — Telephone Encounter (Signed)
Please see mychart message for picture of legs.  Wounds are scabs, open wounds, some look as if they are healing. When asked if they are leaking fluid daughter said yes but not sure how much. Legs are still tight and swollen. Daughter doesn't know if her dad has lost any weight but states he took the extra 4th day of lasix yesterday.  Walking them though setting up mychart to send in a picture. Went over routine wound care and signs of infection.   Forward to Dr. Irish Lack for further advisement.

## 2020-02-05 NOTE — Telephone Encounter (Signed)
Recommendations given. Lasix updated, BMP ordered and set up and Home health ordered. Verbalized understanding.

## 2020-02-05 NOTE — Telephone Encounter (Signed)
Left message to call back  

## 2020-02-05 NOTE — Telephone Encounter (Signed)
New Message:     Daughter called and said pt now have sores on his legs. He still have the swelling, but she is really concerned about the sores.

## 2020-02-05 NOTE — Telephone Encounter (Signed)
WOuld change Lasix to 40 mg BID, all 7 days of the week.  BMet in 1 week.  Elevate legs.  Can we do a home health referral as well to see about wound care. THanks.  JV

## 2020-02-05 NOTE — Addendum Note (Signed)
Addended by: Darrell Jewel on: 02/05/2020 05:32 PM   Modules accepted: Orders

## 2020-02-12 ENCOUNTER — Other Ambulatory Visit: Payer: Medicare HMO

## 2020-02-12 DIAGNOSIS — I214 Non-ST elevation (NSTEMI) myocardial infarction: Secondary | ICD-10-CM | POA: Diagnosis not present

## 2020-02-12 DIAGNOSIS — I25810 Atherosclerosis of coronary artery bypass graft(s) without angina pectoris: Secondary | ICD-10-CM | POA: Diagnosis not present

## 2020-02-12 DIAGNOSIS — M1990 Unspecified osteoarthritis, unspecified site: Secondary | ICD-10-CM | POA: Diagnosis not present

## 2020-02-12 DIAGNOSIS — I10 Essential (primary) hypertension: Secondary | ICD-10-CM | POA: Diagnosis not present

## 2020-02-12 DIAGNOSIS — I480 Paroxysmal atrial fibrillation: Secondary | ICD-10-CM | POA: Diagnosis not present

## 2020-02-12 DIAGNOSIS — Z8546 Personal history of malignant neoplasm of prostate: Secondary | ICD-10-CM | POA: Diagnosis not present

## 2020-02-12 DIAGNOSIS — E7800 Pure hypercholesterolemia, unspecified: Secondary | ICD-10-CM | POA: Diagnosis not present

## 2020-02-12 DIAGNOSIS — E785 Hyperlipidemia, unspecified: Secondary | ICD-10-CM | POA: Diagnosis not present

## 2020-02-12 DIAGNOSIS — I509 Heart failure, unspecified: Secondary | ICD-10-CM | POA: Diagnosis not present

## 2020-02-15 ENCOUNTER — Other Ambulatory Visit: Payer: Self-pay

## 2020-02-15 ENCOUNTER — Inpatient Hospital Stay (HOSPITAL_COMMUNITY)
Admission: EM | Admit: 2020-02-15 | Discharge: 2020-02-20 | DRG: 291 | Disposition: A | Discharge status: Skilled Nursing Facility | Payer: Medicare HMO | Attending: Internal Medicine | Admitting: Internal Medicine

## 2020-02-15 ENCOUNTER — Encounter (HOSPITAL_COMMUNITY): Payer: Self-pay

## 2020-02-15 ENCOUNTER — Emergency Department (HOSPITAL_COMMUNITY): Payer: Medicare HMO

## 2020-02-15 DIAGNOSIS — F0390 Unspecified dementia without behavioral disturbance: Secondary | ICD-10-CM | POA: Diagnosis not present

## 2020-02-15 DIAGNOSIS — Z7901 Long term (current) use of anticoagulants: Secondary | ICD-10-CM

## 2020-02-15 DIAGNOSIS — M1990 Unspecified osteoarthritis, unspecified site: Secondary | ICD-10-CM | POA: Diagnosis present

## 2020-02-15 DIAGNOSIS — I2510 Atherosclerotic heart disease of native coronary artery without angina pectoris: Secondary | ICD-10-CM | POA: Diagnosis present

## 2020-02-15 DIAGNOSIS — I5023 Acute on chronic systolic (congestive) heart failure: Secondary | ICD-10-CM | POA: Diagnosis not present

## 2020-02-15 DIAGNOSIS — Z9114 Patient's other noncompliance with medication regimen: Secondary | ICD-10-CM | POA: Diagnosis not present

## 2020-02-15 DIAGNOSIS — Z9079 Acquired absence of other genital organ(s): Secondary | ICD-10-CM | POA: Diagnosis not present

## 2020-02-15 DIAGNOSIS — Z8546 Personal history of malignant neoplasm of prostate: Secondary | ICD-10-CM

## 2020-02-15 DIAGNOSIS — R0602 Shortness of breath: Secondary | ICD-10-CM | POA: Diagnosis not present

## 2020-02-15 DIAGNOSIS — M6281 Muscle weakness (generalized): Secondary | ICD-10-CM | POA: Diagnosis not present

## 2020-02-15 DIAGNOSIS — Z8673 Personal history of transient ischemic attack (TIA), and cerebral infarction without residual deficits: Secondary | ICD-10-CM | POA: Diagnosis not present

## 2020-02-15 DIAGNOSIS — R9431 Abnormal electrocardiogram [ECG] [EKG]: Secondary | ICD-10-CM | POA: Diagnosis not present

## 2020-02-15 DIAGNOSIS — L03115 Cellulitis of right lower limb: Secondary | ICD-10-CM | POA: Diagnosis present

## 2020-02-15 DIAGNOSIS — R600 Localized edema: Secondary | ICD-10-CM | POA: Diagnosis not present

## 2020-02-15 DIAGNOSIS — Z87891 Personal history of nicotine dependence: Secondary | ICD-10-CM

## 2020-02-15 DIAGNOSIS — I340 Nonrheumatic mitral (valve) insufficiency: Secondary | ICD-10-CM | POA: Diagnosis not present

## 2020-02-15 DIAGNOSIS — E876 Hypokalemia: Secondary | ICD-10-CM | POA: Diagnosis present

## 2020-02-15 DIAGNOSIS — I110 Hypertensive heart disease with heart failure: Secondary | ICD-10-CM | POA: Diagnosis not present

## 2020-02-15 DIAGNOSIS — K759 Inflammatory liver disease, unspecified: Secondary | ICD-10-CM | POA: Diagnosis present

## 2020-02-15 DIAGNOSIS — Z8249 Family history of ischemic heart disease and other diseases of the circulatory system: Secondary | ICD-10-CM | POA: Diagnosis not present

## 2020-02-15 DIAGNOSIS — Z7401 Bed confinement status: Secondary | ICD-10-CM | POA: Diagnosis not present

## 2020-02-15 DIAGNOSIS — N179 Acute kidney failure, unspecified: Secondary | ICD-10-CM | POA: Diagnosis not present

## 2020-02-15 DIAGNOSIS — J811 Chronic pulmonary edema: Secondary | ICD-10-CM | POA: Diagnosis not present

## 2020-02-15 DIAGNOSIS — R52 Pain, unspecified: Secondary | ICD-10-CM | POA: Diagnosis not present

## 2020-02-15 DIAGNOSIS — I69351 Hemiplegia and hemiparesis following cerebral infarction affecting right dominant side: Secondary | ICD-10-CM

## 2020-02-15 DIAGNOSIS — R609 Edema, unspecified: Secondary | ICD-10-CM

## 2020-02-15 DIAGNOSIS — N1832 Chronic kidney disease, stage 3b: Secondary | ICD-10-CM | POA: Diagnosis not present

## 2020-02-15 DIAGNOSIS — I255 Ischemic cardiomyopathy: Secondary | ICD-10-CM | POA: Diagnosis present

## 2020-02-15 DIAGNOSIS — I480 Paroxysmal atrial fibrillation: Secondary | ICD-10-CM | POA: Diagnosis present

## 2020-02-15 DIAGNOSIS — R5381 Other malaise: Secondary | ICD-10-CM | POA: Diagnosis not present

## 2020-02-15 DIAGNOSIS — I10 Essential (primary) hypertension: Secondary | ICD-10-CM | POA: Diagnosis not present

## 2020-02-15 DIAGNOSIS — I5043 Acute on chronic combined systolic (congestive) and diastolic (congestive) heart failure: Secondary | ICD-10-CM | POA: Diagnosis not present

## 2020-02-15 DIAGNOSIS — I252 Old myocardial infarction: Secondary | ICD-10-CM

## 2020-02-15 DIAGNOSIS — I509 Heart failure, unspecified: Secondary | ICD-10-CM

## 2020-02-15 DIAGNOSIS — Z91128 Patient's intentional underdosing of medication regimen for other reason: Secondary | ICD-10-CM

## 2020-02-15 DIAGNOSIS — M2550 Pain in unspecified joint: Secondary | ICD-10-CM | POA: Diagnosis not present

## 2020-02-15 DIAGNOSIS — G4730 Sleep apnea, unspecified: Secondary | ICD-10-CM | POA: Diagnosis present

## 2020-02-15 DIAGNOSIS — I517 Cardiomegaly: Secondary | ICD-10-CM | POA: Diagnosis not present

## 2020-02-15 DIAGNOSIS — Z20822 Contact with and (suspected) exposure to covid-19: Secondary | ICD-10-CM | POA: Diagnosis present

## 2020-02-15 DIAGNOSIS — R0902 Hypoxemia: Secondary | ICD-10-CM | POA: Diagnosis not present

## 2020-02-15 DIAGNOSIS — T501X6A Underdosing of loop [high-ceiling] diuretics, initial encounter: Secondary | ICD-10-CM | POA: Diagnosis present

## 2020-02-15 DIAGNOSIS — I130 Hypertensive heart and chronic kidney disease with heart failure and stage 1 through stage 4 chronic kidney disease, or unspecified chronic kidney disease: Secondary | ICD-10-CM | POA: Diagnosis not present

## 2020-02-15 DIAGNOSIS — I361 Nonrheumatic tricuspid (valve) insufficiency: Secondary | ICD-10-CM | POA: Diagnosis not present

## 2020-02-15 HISTORY — DX: Heart failure, unspecified: I50.9

## 2020-02-15 LAB — CBC WITH DIFFERENTIAL/PLATELET
Abs Immature Granulocytes: 0.02 10*3/uL (ref 0.00–0.07)
Basophils Absolute: 0.1 10*3/uL (ref 0.0–0.1)
Basophils Relative: 1 %
Eosinophils Absolute: 0.1 10*3/uL (ref 0.0–0.5)
Eosinophils Relative: 2 %
HCT: 39.5 % (ref 39.0–52.0)
Hemoglobin: 13.1 g/dL (ref 13.0–17.0)
Immature Granulocytes: 0 %
Lymphocytes Relative: 20 %
Lymphs Abs: 1.1 10*3/uL (ref 0.7–4.0)
MCH: 30.1 pg (ref 26.0–34.0)
MCHC: 33.2 g/dL (ref 30.0–36.0)
MCV: 90.8 fL (ref 80.0–100.0)
Monocytes Absolute: 0.5 10*3/uL (ref 0.1–1.0)
Monocytes Relative: 9 %
Neutro Abs: 3.6 10*3/uL (ref 1.7–7.7)
Neutrophils Relative %: 68 %
Platelets: 220 10*3/uL (ref 150–400)
RBC: 4.35 MIL/uL (ref 4.22–5.81)
RDW: 14.9 % (ref 11.5–15.5)
WBC: 5.3 10*3/uL (ref 4.0–10.5)
nRBC: 0 % (ref 0.0–0.2)

## 2020-02-15 LAB — COMPREHENSIVE METABOLIC PANEL
ALT: 23 U/L (ref 0–44)
AST: 24 U/L (ref 15–41)
Albumin: 3 g/dL — ABNORMAL LOW (ref 3.5–5.0)
Alkaline Phosphatase: 69 U/L (ref 38–126)
Anion gap: 10 (ref 5–15)
BUN: 25 mg/dL — ABNORMAL HIGH (ref 8–23)
CO2: 27 mmol/L (ref 22–32)
Calcium: 8.7 mg/dL — ABNORMAL LOW (ref 8.9–10.3)
Chloride: 107 mmol/L (ref 98–111)
Creatinine, Ser: 1.64 mg/dL — ABNORMAL HIGH (ref 0.61–1.24)
GFR, Estimated: 41 mL/min — ABNORMAL LOW (ref >60)
Glucose, Bld: 96 mg/dL (ref 70–99)
Potassium: 3.3 mmol/L — ABNORMAL LOW (ref 3.5–5.1)
Sodium: 144 mmol/L (ref 135–145)
Total Bilirubin: 1.4 mg/dL — ABNORMAL HIGH (ref 0.3–1.2)
Total Protein: 6.5 g/dL (ref 6.5–8.1)

## 2020-02-15 LAB — MAGNESIUM: Magnesium: 2 mg/dL (ref 1.7–2.4)

## 2020-02-15 LAB — SARS CORONAVIRUS 2 (TAT 6-24 HRS): SARS Coronavirus 2: NEGATIVE

## 2020-02-15 LAB — BRAIN NATRIURETIC PEPTIDE: B Natriuretic Peptide: 2983.2 pg/mL — ABNORMAL HIGH (ref 0.0–100.0)

## 2020-02-15 MED ORDER — IPRATROPIUM-ALBUTEROL 0.5-2.5 (3) MG/3ML IN SOLN
3.0000 mL | Freq: Four times a day (QID) | RESPIRATORY_TRACT | Status: DC | PRN
  Administered 2020-02-16: 3 mL via RESPIRATORY_TRACT
  Filled 2020-02-15: qty 3

## 2020-02-15 MED ORDER — SACUBITRIL-VALSARTAN 24-26 MG PO TABS: 1.0000 | ORAL_TABLET | Freq: Two times a day (BID) | ORAL | Status: DC

## 2020-02-15 MED ORDER — ENOXAPARIN SODIUM 40 MG/0.4ML ~~LOC~~ SOLN: 40.0000 mg | SUBCUTANEOUS | Status: DC

## 2020-02-15 MED ORDER — SODIUM CHLORIDE 0.9% FLUSH
3.0000 mL | Freq: Two times a day (BID) | INTRAVENOUS | Status: DC
  Administered 2020-02-15 – 2020-02-20 (×11): 3 mL via INTRAVENOUS

## 2020-02-15 MED ORDER — FAMOTIDINE 20 MG PO TABS
20.0000 mg | ORAL_TABLET | Freq: Every day | ORAL | Status: DC
  Administered 2020-02-15 – 2020-02-19 (×5): 20 mg via ORAL
  Filled 2020-02-15 (×5): qty 1

## 2020-02-15 MED ORDER — METOPROLOL SUCCINATE ER 25 MG PO TB24
25.0000 mg | ORAL_TABLET | Freq: Two times a day (BID) | ORAL | Status: DC
  Administered 2020-02-15 – 2020-02-20 (×10): 25 mg via ORAL
  Filled 2020-02-15 (×10): qty 1

## 2020-02-15 MED ORDER — ONDANSETRON HCL 4 MG/2ML IJ SOLN: 4.0000 mg | Freq: Four times a day (QID) | INTRAMUSCULAR | Status: DC | PRN

## 2020-02-15 MED ORDER — SACCHAROMYCES BOULARDII 250 MG PO CAPS
250.0000 mg | ORAL_CAPSULE | Freq: Two times a day (BID) | ORAL | Status: DC
  Administered 2020-02-16 – 2020-02-20 (×9): 250 mg via ORAL
  Filled 2020-02-15 (×12): qty 1

## 2020-02-15 MED ORDER — ATORVASTATIN CALCIUM 40 MG PO TABS
40.0000 mg | ORAL_TABLET | Freq: Every evening | ORAL | Status: DC
  Administered 2020-02-15 – 2020-02-19 (×5): 40 mg via ORAL
  Filled 2020-02-15 (×4): qty 1
  Filled 2020-02-15: qty 4

## 2020-02-15 MED ORDER — ACETAMINOPHEN 325 MG PO TABS
650.0000 mg | ORAL_TABLET | ORAL | Status: DC | PRN
  Administered 2020-02-16 – 2020-02-18 (×2): 650 mg via ORAL
  Filled 2020-02-15 (×2): qty 2

## 2020-02-15 MED ORDER — APIXABAN 2.5 MG PO TABS
2.5000 mg | ORAL_TABLET | Freq: Two times a day (BID) | ORAL | Status: DC
  Administered 2020-02-15 – 2020-02-20 (×10): 2.5 mg via ORAL
  Filled 2020-02-15 (×11): qty 1

## 2020-02-15 MED ORDER — SODIUM CHLORIDE 0.9% FLUSH: 3.0000 mL | INTRAVENOUS | Status: DC | PRN

## 2020-02-15 MED ORDER — LATANOPROST 0.005 % OP SOLN
1.0000 [drp] | Freq: Every day | OPHTHALMIC | Status: DC
  Administered 2020-02-16 – 2020-02-19 (×4): 1 [drp] via OPHTHALMIC
  Filled 2020-02-15: qty 2.5

## 2020-02-15 MED ORDER — ADULT MULTIVITAMIN W/MINERALS CH
1.0000 | ORAL_TABLET | Freq: Every day | ORAL | Status: DC
  Administered 2020-02-15 – 2020-02-20 (×6): 1 via ORAL
  Filled 2020-02-15 (×6): qty 1

## 2020-02-15 MED ORDER — SODIUM CHLORIDE 0.9 % IV SOLN: 250.0000 mL | INTRAVENOUS | Status: DC | PRN

## 2020-02-15 MED ORDER — FUROSEMIDE 10 MG/ML IJ SOLN
80.0000 mg | Freq: Once | INTRAMUSCULAR | Status: AC
  Administered 2020-02-15: 80 mg via INTRAVENOUS
  Filled 2020-02-15: qty 8

## 2020-02-15 MED ORDER — FUROSEMIDE 10 MG/ML IJ SOLN
40.0000 mg | Freq: Two times a day (BID) | INTRAMUSCULAR | Status: DC
  Administered 2020-02-15 – 2020-02-18 (×7): 40 mg via INTRAVENOUS
  Filled 2020-02-15 (×8): qty 4

## 2020-02-15 MED ORDER — NITROGLYCERIN 0.4 MG SL SUBL: 0.4000 mg | SUBLINGUAL_TABLET | SUBLINGUAL | Status: DC | PRN

## 2020-02-15 MED ORDER — SODIUM CHLORIDE 0.9 % IV SOLN
2.0000 g | INTRAVENOUS | Status: AC
  Administered 2020-02-15 – 2020-02-19 (×5): 2 g via INTRAVENOUS
  Filled 2020-02-15 (×5): qty 20

## 2020-02-15 MED ORDER — POTASSIUM CHLORIDE CRYS ER 20 MEQ PO TBCR
40.0000 meq | EXTENDED_RELEASE_TABLET | Freq: Once | ORAL | Status: AC
  Administered 2020-02-15: 40 meq via ORAL
  Filled 2020-02-15: qty 2

## 2020-02-15 NOTE — ED Notes (Signed)
Spoke with pts daughter via phone, explained to her importance of her father being compliant with taking his home medications, especially his lasix and that it may be necessary for her to monitor this compliance, understanding voiced

## 2020-02-15 NOTE — ED Notes (Signed)
Pt requesting food, MD notified, ok via MD, pt given sandwich and gingerale

## 2020-02-15 NOTE — Progress Notes (Signed)
Lower extremity venous RT study completed.  Preliminary results relayed to Melina Copa, MD.   See CV Proc for preliminary results report.   Darlin Coco, RDMS

## 2020-02-15 NOTE — ED Notes (Signed)
Heart failure care plan not initiated, mischarted

## 2020-02-15 NOTE — ED Notes (Signed)
[employer] rep contacted via charge nurse to interrogate loop recorder

## 2020-02-15 NOTE — ED Provider Notes (Signed)
[facility] EMERGENCY DEPARTMENT Provider Note   CSN: [id number] Arrival date & time: 02/15/20  1129     History Chief Complaint  Patient presents with  . Leg Pain    And edema    Brian Brown is a 87 y.o. male.  He has a history of paroxysmal A. fib on anticoagulation, heart failure and peripheral edema.  He says his legs have been increased in swelling and pain for about 2 or 3 weeks.  He states he has been taking his diuretics regularly.  He was unable to answer the door today for his [employer] because it would have hurt too much in his feet.  EMS reportedly checked his pill organizer and he has missed several days of his Lasix.  He feels the shortness of breath at baseline.  No chest pain fevers or chills.  The history is provided by the patient and the EMS personnel.  Leg Pain Location:  Leg Leg location:  L lower leg and R lower leg Pain details:    Quality:  Pressure and burning   Severity:  Moderate   Onset quality:  Gradual   Timing:  Intermittent   Progression:  Worsening Chronicity:  New Relieved by:  Nothing Worsened by:  Bearing weight Ineffective treatments: diuretics. Associated symptoms: swelling   Associated symptoms: no fever        Past Medical History:  Diagnosis Date  . Arthritis    degenerative joint disease  . Bleeding stomach ulcer 25 yrs ago  . CAD (coronary artery disease)    a. 10/2018 NSTEMI/Cath: LM nl, LAD 60p/m, 30d, D1 90, LCX 50ost/p, OM2 90 thrombotic, RCA 30p, 20d-->med rx.  . CKD (chronic kidney disease), stage II   . Hepatitis 1981   does not know what kind;denies jaundice  . HFrEF (heart failure with reduced ejection fraction) ([place])    a. 08/2019 Echo: EF 21%.  Marland Kitchen Hypertension   . Ischemic cardiomyopathy    a. 02/2018 Echo: EF 60-65%; b. 10/2018 Echo: EF 45-50%; c. 08/2019 Echo: EF 21%, mild LVH. RVSP 41.70mmHg. Mod BAE. Mild MR. Triv AI.  . Mild dilation of ascending aorta ([place])   . PAF (paroxysmal  atrial fibrillation) ([place])    a. 10/2018 noted on Linq-->eliquis initiated (CHA2DS2VASc = 7) however d/c'd in 08/2019 2/2 cerebral bleed.  . Prostate cancer [facility]) 2002   prostate, s/p surgery  . Sleep apnea 2005   does not wear a Cpap  . Stroke [facility])    a. 2003 mild residual right leg wkns; b. 04/979 embolic stroke, later found to have PAF.  Marland Kitchen Wears dentures    full top  . Wears glasses     Patient Active Problem List   Diagnosis Date Noted  . Goals of care, counseling/discussion   . Palliative care by specialist   . Persistent atrial fibrillation ([place])   . Coronary artery disease   . Acute systolic heart failure ([place]) 08/31/2019  . Acute on chronic systolic CHF (congestive heart failure) ([place]) 08/31/2019  . AMS (altered mental status) 08/31/2019  . Acute respiratory failure with hypoxia ([place]) 08/31/2019  . Lactic acidosis 08/31/2019  . History of intracerebral hemorrhage without residual deficit 08/31/2019  . Protein-calorie malnutrition, severe 08/08/2019  . Intracerebral hemorrhage ([place]) 08/07/2019  . Acute CHF (congestive heart failure) ([place]) 08/06/2019  . AKI (acute kidney injury) ([place]) 08/06/2019  . Elevated troponin 08/06/2019  . Transaminitis 08/06/2019  . Closed rib fracture 08/06/2019  . History of  CVA (cerebrovascular accident) 08/06/2019  . PAF (paroxysmal atrial fibrillation) ([place]) 10/20/2018  . NSTEMI (non-ST elevated myocardial infarction) ([place])   . Acute CVA (cerebrovascular accident) ([place]) 02/14/2018  . TIA (transient ischemic attack) 02/13/2018  . Hepatitis C reactive 02/06/2011  . Hyponatremia 02/06/2011  . Postoperative anemia due to acute blood loss 02/05/2011  . Hypokalemia 02/05/2011  . Osteoarthritis of hip 01/27/2011  . Hypertension 01/27/2011  . Sleep apnea 01/27/2011  . Stroke/cerebrovascular accident ([place]) 01/27/2011  . Cancer of prostate ([place]) 01/27/2011    Past Surgical History:  Procedure Laterality Date  . BUBBLE STUDY  09/16/2018    Procedure: BUBBLE STUDY;  Surgeon: Lelon Perla, MD;  Location: [facility];  Service: Cardiovascular;;  . CARDIAC CATHETERIZATION  10/19/2018  . CARPAL TUNNEL RELEASE Left 03/29/2013   Procedure: LEFT CARPAL TUNNEL RELEASE;  Surgeon: Wynonia Sours, MD;  Location: [place];  Service: Orthopedics;  Laterality: Left;  ANESTHESIA: IV REGIONAL FAB  . CARPAL TUNNEL RELEASE Right 03/06/2014   Procedure: RIGHT CARPAL TUNNEL RELEASE;  Surgeon: Daryll Brod, MD;  Location: [facility];  Service: Orthopedics;  Laterality: Right;  . COLONOSCOPY    . HERNIA REPAIR  2009   rt inguinal hernia  . JOINT REPLACEMENT  12/2001   rt hip ;[place] CATH AND CORONARY ANGIOGRAPHY N/A 10/19/2018   Procedure: LEFT HEART CATH AND CORONARY ANGIOGRAPHY;  Surgeon: Wellington Hampshire, MD;  Location: [place] CV LAB;  Service: Cardiovascular;  Laterality: N/A;  . LOOP RECORDER INSERTION N/A 09/16/2018   Procedure: LOOP RECORDER INSERTION;  Surgeon: Constance Haw, MD;  Location: [place] CV LAB;  Service: Cardiovascular;  Laterality: N/A;  . PROSTATECTOMY  2002  . SHOULDER ARTHROSCOPY W/ ROTATOR CUFF REPAIR  2014   lt-dr [place]field  . TEE WITHOUT CARDIOVERSION N/A 09/16/2018   Procedure: TRANSESOPHAGEAL ECHOCARDIOGRAM (TEE);  Surgeon: Lelon Perla, MD;  Location: [facility] ENDOSCOPY;  Service: Cardiovascular;  Laterality: N/A;  . TOTAL HIP ARTHROPLASTY  02/03/2011   Procedure: TOTAL HIP ARTHROPLASTY;  Surgeon: Garald Balding, MD;  Location: [facility];  Service: Orthopedics;  Laterality: Left;  . TRIGGER FINGER RELEASE Left 03/29/2013   Procedure: LEFT MIDDLE/SMALL FINGER RELEASE A-1 PULLEY AND LEFT RING FINGER;  Surgeon: Wynonia Sours, MD;  Location: [place] [facility];  Service: Orthopedics;  Laterality: Left;  . TRIGGER FINGER RELEASE Right 03/06/2014   Procedure: RIGHT MIDDLE FINGER,RIGHT RINGER FINGER;  Surgeon: Daryll Brod, MD;  Location: [facility];  Service: Orthopedics;  Laterality: Right;       Family History  Problem Relation Age of Onset  . Heart disease Mother   . Heart disease Father     Social History   Tobacco Use  . Smoking status: Former Smoker    Packs/day: 1.00    Years: 30.00    Pack years: 30.00    Types: Cigarettes    Quit date: 01/27/2008    Years since quitting: 12.0  . Smokeless tobacco: Never Used  Vaping Use  . Vaping Use: Never used  Substance Use Topics  . Alcohol use: No  . Drug use: No    Home Medications Prior to Admission medications   Medication Sig Start Date End Date Taking? Authorizing Provider  atorvastatin (LIPITOR) 40 MG tablet TAKE 1 TABLET EVERY DAY Patient taking differently: Take 40 mg by mouth every evening. 07/24/19   Jettie Booze, MD  ELIQUIS 5 MG TABS tablet TAKE  1 TABLET BY MOUTH TWICE A DAY 12/08/19   Jettie Booze, MD  famotidine (PEPCID) 20 MG tablet Take 1 tablet (20 mg total) by mouth at bedtime. 08/11/19   Little Ishikawa, MD  furosemide (LASIX) 40 MG tablet Take 1 tablet (40 mg total) by mouth 2 (two) times daily. 02/05/20   Jettie Booze, MD  latanoprost (XALATAN) 0.005 % ophthalmic solution Place 1 drop into both eyes at bedtime.    [provider]  metoprolol succinate (TOPROL-XL) 25 MG 24 hr tablet TAKE 1 TABLET (25 MG TOTAL) BY MOUTH 2 (TWO) TIMES DAILY WITH A MEAL. 12/20/19   Jettie Booze, MD  Multiple Vitamin (MULTIVITAMIN WITH MINERALS) TABS tablet Take 1 tablet by mouth daily. 08/11/19   Little Ishikawa, MD  nitroGLYCERIN (NITROSTAT) 0.4 MG SL tablet Place 1 tablet (0.4 mg total) under the tongue every 5 (five) minutes x 3 doses as needed for chest pain. 11/22/18   Jettie Booze, MD  OVER THE COUNTER MEDICATION Take 2 capsules by mouth at bedtime. Omega XL    [provider]  sacubitril-valsartan (ENTRESTO) 24-26 MG Take 1 tablet by mouth 2 (two) times daily. 10/30/19   Jettie Booze, MD     Allergies    Patient has no known allergies.  Review of Systems   Review of Systems  Constitutional: Negative for fever.  HENT: Negative for sore throat.   Eyes: Negative for pain.  Respiratory: Negative for shortness of breath.   Cardiovascular: Positive for leg swelling. Negative for chest pain.  Gastrointestinal: Negative for abdominal pain.  Genitourinary: Negative for dysuria.  Musculoskeletal: Positive for gait problem.  Skin: Positive for rash and wound.  Neurological: Negative for headaches.    Physical Exam Updated Vital Signs BP (!) 132/95 (BP Location: Right Arm)   Pulse 82   Temp 97.7 F (36.5 C)   Resp 20   Ht 5\' 9"  (1.753 m)   Wt 87.6 kg   SpO2 98%   BMI 28.53 kg/m   Physical Exam Vitals and nursing note reviewed.  Constitutional:      Appearance: Normal appearance. He is well-developed and well-nourished.  HENT:     Head: Normocephalic and atraumatic.  Eyes:     Conjunctiva/sclera: Conjunctivae normal.  Cardiovascular:     Rate and Rhythm: Normal rate and regular rhythm.     Heart sounds: No murmur heard.   Pulmonary:     Effort: Pulmonary effort is normal. No respiratory distress.     Breath sounds: Normal breath sounds.  Abdominal:     Palpations: Abdomen is soft.     Tenderness: There is no abdominal tenderness.  Musculoskeletal:        General: Tenderness present.     Cervical back: Neck supple.     Right lower leg: Edema present.     Left lower leg: Edema present.     Comments: He has tender bilateral lower extremities from the knees down to his feet.  Right worse than her left.  Right is also slightly reddened.  He has some excoriations on both of his lower extremities.  No ulcerations.  Right side is slightly warm.  Skin:    General: Skin is warm and dry.  Neurological:     General: No focal deficit present.     Mental Status: He is alert.  Psychiatric:        Mood and Affect: Mood and affect normal.       ED  Results /  Procedures / Treatments   Labs (all labs ordered are listed, but only abnormal results are displayed) Labs Reviewed  COMPREHENSIVE METABOLIC PANEL - Abnormal; Notable for the following components:      Result Value   Potassium 3.3 (*)    BUN 25 (*)    Creatinine, Ser 1.64 (*)    Calcium 8.7 (*)    Albumin 3.0 (*)    Total Bilirubin 1.4 (*)    GFR, Estimated 41 (*)    All other components within normal limits  BRAIN NATRIURETIC PEPTIDE - Abnormal; Notable for the following components:   B Natriuretic Peptide 2,983.2 (*)    All other components within normal limits  SARS CORONAVIRUS 2 (TAT 6-24 HRS)  CBC WITH DIFFERENTIAL/PLATELET  MAGNESIUM  C-REACTIVE PROTEIN  BASIC METABOLIC PANEL    EKG EKG Interpretation  Date/Time:  Thursday February 15 2020 11:42:40 EST Ventricular Rate:  80 PR Interval:    QRS Duration: 72 QT Interval:  504 QTC Calculation: 582 R Axis:   -3 Text Interpretation: Atrial fibrillation Low voltage, extremity leads Consider anterior infarct Nonspecific T abnormalities, lateral leads Prolonged QT interval No significant change since prior 8/21 Confirmed by Aletta Edouard [phone]) on 02/15/2020 11:47:09 AM   Radiology DG Chest Port 1 View  Result Date: 02/15/2020 CLINICAL DATA:  Shortness of breath. EXAM: PORTABLE CHEST 1 VIEW COMPARISON:  Multiple priors, most recent September 05, 2019. FINDINGS: Enlarged cardiac silhouette with central pulmonary vascular congestion. Loop recorder projects over the left chest. Both lungs are clear. No visible pleural effusions or pneumothorax. No acute osseous abnormality. IMPRESSION: Cardiomegaly and central pulmonary vascular congestion without overt pulmonary edema. Electronically Signed   By: Margaretha Sheffield MD   On: 02/15/2020 12:52   VAS [place] LOWER EXTREMITY VENOUS (DVT) (ONLY [facility] & [facility] 7a-7p)  Result Date: 02/15/2020  Lower Venous DVT Study Indications: Swelling and pain in lower extremities. RT>LT.  Limitations: Patient  intolerant to compressions in calf. Comparison Study: No prior studies. Performing Technologist: Darlin Coco RDMS  Examination Guidelines: A complete evaluation includes B-mode imaging, spectral Doppler, color Doppler, and power Doppler as needed of all accessible portions of each vessel. Bilateral testing is considered an integral part of a complete examination. Limited examinations for reoccurring indications may be performed as noted. The reflux portion of the exam is performed with the patient in reverse Trendelenburg.  +---------+---------------+---------+-----------+----------+--------------+ RIGHT    CompressibilityPhasicitySpontaneityPropertiesThrombus Aging +---------+---------------+---------+-----------+----------+--------------+ CFV      Full           No       Yes                                 +---------+---------------+---------+-----------+----------+--------------+ SFJ      Full                                                        +---------+---------------+---------+-----------+----------+--------------+ FV Prox  Full                                                        +---------+---------------+---------+-----------+----------+--------------+ FV  Mid   Full                                                        +---------+---------------+---------+-----------+----------+--------------+ FV DistalFull                                                        +---------+---------------+---------+-----------+----------+--------------+ PFV      Full                                                        +---------+---------------+---------+-----------+----------+--------------+ POP      Full           No       Yes                                 +---------+---------------+---------+-----------+----------+--------------+ PTV                     No       Yes                                  +---------+---------------+---------+-----------+----------+--------------+ PERO                    No       Yes                                 +---------+---------------+---------+-----------+----------+--------------+   +----+---------------+---------+-----------+----------+--------------+ LEFTCompressibilityPhasicitySpontaneityPropertiesThrombus Aging +----+---------------+---------+-----------+----------+--------------+ CFV Full           Yes      Yes                                 +----+---------------+---------+-----------+----------+--------------+     Summary: RIGHT: - There is no evidence of deep vein thrombosis in the lower extremity. However, portions of this examination were limited- see technologist comments above.  - Waveforms consistent with elevated right heart pressures. - Ultrasound characteristics of enlarged lymph nodes are noted in the groin.  LEFT: - No evidence of common femoral vein obstruction. - Ultrasound characteristics of enlarged lymph nodes noted in the groin.  *See table(s) above for measurements and observations. Electronically signed by Servando Snare MD on 02/15/2020 at 4:20:21 PM.    Final     Procedures Procedures   Medications Ordered in ED Medications  sodium chloride flush (NS) 0.9 % injection 3 mL (3 mLs Intravenous Given 02/15/20 1439)  sodium chloride flush (NS) 0.9 % injection 3 mL (has no administration in time range)  0.9 %  sodium chloride infusion (has no administration in time range)  acetaminophen (TYLENOL) tablet 650 mg (has no administration in time range)  furosemide (LASIX) injection 40 mg (40 mg Intravenous Given 02/15/20 1744)  atorvastatin (  LIPITOR) tablet 40 mg (40 mg Oral Given 02/15/20 1744)  metoprolol succinate (TOPROL-XL) 24 hr tablet 25 mg (25 mg Oral Given 02/15/20 1743)  nitroGLYCERIN (NITROSTAT) SL tablet 0.4 mg (has no administration in time range)  famotidine (PEPCID) tablet 20 mg (has no administration in time  range)  multivitamin with minerals tablet 1 tablet (1 tablet Oral Given 02/15/20 1744)  latanoprost (XALATAN) 0.005 % ophthalmic solution 1 drop (has no administration in time range)  apixaban (ELIQUIS) tablet 2.5 mg (has no administration in time range)  saccharomyces boulardii (FLORASTOR) capsule 250 mg (has no administration in time range)  cefTRIAXone (ROCEPHIN) 2 g in sodium chloride 0.9 % 100 mL IVPB (0 g Intravenous Stopped 02/15/20 1820)  ipratropium-albuterol (DUONEB) 0.5-2.5 (3) MG/3ML nebulizer solution 3 mL (has no administration in time range)  furosemide (LASIX) injection 80 mg (80 mg Intravenous Given 02/15/20 1427)  potassium chloride SA (KLOR-CON) CR tablet 40 mEq (40 mEq Oral Given 02/15/20 1428)    ED Course  I have reviewed the triage vital signs and the nursing notes.  Pertinent labs & imaging results that were available during my care of the patient were reviewed by me and considered in my medical decision making (see chart for details).    MDM Rules/Calculators/A&P                         This patient complains of bilateral leg swelling and pain; this involves an extensive number of treatment Options and is a complaint that carries with it a high risk of complications and Morbidity. The differential includes peripheral edema, CHF, DVT, cellulitis, osteo-  I ordered, reviewed and interpreted labs, which included CBC with normal white count normal hemoglobin, chemistries fairly normal other than low potassium elevated creatinine, BNP markedly elevated, Covid testing negative I ordered medication IV Lasix for diuresis I ordered imaging studies which included duplex right lower extremity and I independently    visualized and interpreted imaging which showed no acute DVT  Previous records obtained and reviewed in epic, cardiology has been following patient has tried increasing Lasix dose and interval without any improvement I consulted Dr. Tamala Julian [facility] hospitalist and  discussed lab and imaging findings  Critical Interventions: None  After the interventions stated above, I reevaluated the patient and found patient still to be symptomatic and having difficulty walking.  He is failed outpatient management of his diuresis and unable to walk.  Will need admission for IV diuretics.  Also concerns for signs of infection right lower extremity.  Discussed with Dr. Tamala Julian who will evaluate patient decided to start antibiotics.   Final Clinical Impression(s) / ED Diagnoses Final diagnoses:  Peripheral edema  Acute on chronic combined systolic and diastolic CHF (congestive heart failure) (HCC)  Hypokalemia  Cellulitis of right lower leg    Rx / DC Orders ED Discharge Orders    None       Hayden Rasmussen, MD 02/15/20 2047

## 2020-02-15 NOTE — ED Notes (Signed)
Condom cath applied to pt. 

## 2020-02-15 NOTE — ED Triage Notes (Addendum)
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

## 2020-02-15 NOTE — H&P (Addendum)
History and Physical    Brian Brown KKX:[id number] DOB: 09-04-1932 DOA: 02/15/2020  Referring MD/NP/PA: Aletta Edouard, MD PCP: Seward Carol, MD  Consultants: Dr. Varanasi-cardiology Patient coming from: Home via EMS  Chief Complaint: Leg swelling and weakness  I have personally briefly reviewed patient's old medical records in [place]   HPI: Brian Brown is a 87 y.o. male with medical history significant of  CVA with residual right-sided weakness, hypertension, CAD s/p NSTEMI, systolic CHF last EF 16%, paroxysmal atrial fibrillation, history of ICH presents with complaints of leg swelling and weakness over the last 3 to 4 weeks.  Patient reports that he lives alone and gives himself his medications.    Sometimes he forgets to take medications at the schedule times and therefore misses doses.  His godson lives nearby and checks on him. He had gotten weaker to the point in which he was unable to get up and move around today.  Meals on Wheels had come to his house, but patient was so weak he was unable to get to the door and they called EMS.  Associated symptoms of bilateral foot pain and shortness of breath.  Denies any recent fever, chills, chest pain, nausea, vomiting, or dysuria symptoms.  He reports that he quit smoking recently and does not drink alcohol.  He had last followed up with his cardiologist in January.  Patient was restarted on Eliquis and Delene Loll was added to his medication regimen after his cardiology visit in September 2021.  EMS had looked at patient's pill organizer and noted that the patient had missed several doses of medications.  ED Course: Upon admission into the emergency department patient was noted to be with blood pressures 132/93-136/94, and O2 saturations 90-100% on room air.  Labs significant for potassium 3.3, BUN 26, creatinine 1.64, albumin 3, total bilirubin 1.4, and BNP 2983.2.  Chest x-ray significant for cardiomegaly with central pulmonary  vascular congestion without overt pulmonary edema.  Doppler ultrasounds of the lower extremities were negative for DVT.  Patient has been given Lasix 40 mg IV.  TRH called to admit.  Review of Systems  Unable to perform ROS: Dementia  Constitutional: Positive for malaise/fatigue. Negative for fever.  Eyes: Negative for photophobia and pain.  Respiratory: Positive for shortness of breath.   Cardiovascular: Positive for orthopnea and leg swelling. Negative for chest pain.  Gastrointestinal: Negative for abdominal pain, diarrhea, nausea and vomiting.  Genitourinary: Negative for hematuria.  Musculoskeletal: Positive for myalgias.  Skin: Positive for rash.  Neurological: Positive for weakness. Negative for focal weakness.  Psychiatric/Behavioral: Positive for memory loss.    Past Medical History:  Diagnosis Date  . Arthritis    degenerative joint disease  . Bleeding stomach ulcer 25 yrs ago  . CAD (coronary artery disease)    a. 10/2018 NSTEMI/Cath: LM nl, LAD 60p/m, 30d, D1 90, LCX 50ost/p, OM2 90 thrombotic, RCA 30p, 20d-->med rx.  . CKD (chronic kidney disease), stage II   . Hepatitis 1981   does not know what kind;denies jaundice  . HFrEF (heart failure with reduced ejection fraction) ([place])    a. 08/2019 Echo: EF 21%.  Marland Kitchen Hypertension   . Ischemic cardiomyopathy    a. 02/2018 Echo: EF 60-65%; b. 10/2018 Echo: EF 45-50%; c. 08/2019 Echo: EF 21%, mild LVH. RVSP 41.68mmHg. Mod BAE. Mild MR. Triv AI.  . Mild dilation of ascending aorta ([place])   . PAF (paroxysmal atrial fibrillation) ([place])    a. 10/2018 noted on Linq-->eliquis  initiated (CHA2DS2VASc = 7) however d/c'd in 08/2019 2/2 cerebral bleed.  . Prostate cancer [facility]) 2002   prostate, s/p surgery  . Sleep apnea 2005   does not wear a Cpap  . Stroke [facility])    a. 2003 mild residual right leg wkns; b. 02/5051 embolic stroke, later found to have PAF.  Marland Kitchen Wears dentures    full top  . Wears glasses     Past Surgical History:  Procedure  Laterality Date  . BUBBLE STUDY  09/16/2018   Procedure: BUBBLE STUDY;  Surgeon: Lelon Perla, MD;  Location: [place];  Service: Cardiovascular;;  . CARDIAC CATHETERIZATION  10/19/2018  . CARPAL TUNNEL RELEASE Left 03/29/2013   Procedure: LEFT CARPAL TUNNEL RELEASE;  Surgeon: Wynonia Sours, MD;  Location: [place];  Service: Orthopedics;  Laterality: Left;  ANESTHESIA: IV REGIONAL FAB  . CARPAL TUNNEL RELEASE Right 03/06/2014   Procedure: RIGHT CARPAL TUNNEL RELEASE;  Surgeon: Daryll Brod, MD;  Location: [place];  Service: Orthopedics;  Laterality: Right;  . COLONOSCOPY    . HERNIA REPAIR  2009   rt inguinal hernia  . JOINT REPLACEMENT  12/2001   rt hip ;[place] CATH AND CORONARY ANGIOGRAPHY N/A 10/19/2018   Procedure: LEFT HEART CATH AND CORONARY ANGIOGRAPHY;  Surgeon: Wellington Hampshire, MD;  Location: [place] CV LAB;  Service: Cardiovascular;  Laterality: N/A;  . LOOP RECORDER INSERTION N/A 09/16/2018   Procedure: LOOP RECORDER INSERTION;  Surgeon: Constance Haw, MD;  Location: [place] CV LAB;  Service: Cardiovascular;  Laterality: N/A;  . PROSTATECTOMY  2002  . SHOULDER ARTHROSCOPY W/ ROTATOR CUFF REPAIR  2014   lt-dr whitfield  . TEE WITHOUT CARDIOVERSION N/A 09/16/2018   Procedure: TRANSESOPHAGEAL ECHOCARDIOGRAM (TEE);  Surgeon: Lelon Perla, MD;  Location: [facility] ENDOSCOPY;  Service: Cardiovascular;  Laterality: N/A;  . TOTAL HIP ARTHROPLASTY  02/03/2011   Procedure: TOTAL HIP ARTHROPLASTY;  Surgeon: Garald Balding, MD;  Location: [place];  Service: Orthopedics;  Laterality: Left;  . TRIGGER FINGER RELEASE Left 03/29/2013   Procedure: LEFT MIDDLE/SMALL FINGER RELEASE A-1 PULLEY AND LEFT RING FINGER;  Surgeon: Wynonia Sours, MD;  Location: [place];  Service: Orthopedics;  Laterality: Left;  . TRIGGER FINGER RELEASE Right 03/06/2014   Procedure: RIGHT MIDDLE FINGER,RIGHT RINGER FINGER;  Surgeon:  Daryll Brod, MD;  Location: [place];  Service: Orthopedics;  Laterality: Right;     reports that he quit smoking about 12 years ago. His smoking use included cigarettes. He has a 30.00 pack-year smoking history. He has never used smokeless tobacco. He reports that he does not drink alcohol and does not use drugs.  No Known Allergies  Family History  Problem Relation Age of Onset  . Heart disease Mother   . Heart disease Father     Prior to Admission medications   Medication Sig Start Date End Date Taking? Authorizing Provider  atorvastatin (LIPITOR) 40 MG tablet TAKE 1 TABLET EVERY DAY Patient taking differently: Take 40 mg by mouth every evening. 07/24/19   Jettie Booze, MD  ELIQUIS 5 MG TABS tablet TAKE 1 TABLET BY MOUTH TWICE A DAY 12/08/19   Jettie Booze, MD  famotidine (PEPCID) 20 MG tablet Take 1 tablet (20 mg total) by mouth at bedtime. 08/11/19   Little Ishikawa, MD  furosemide (LASIX) 40 MG tablet Take 1 tablet (40 mg total) by mouth 2 (two) times  daily. 02/05/20   Jettie Booze, MD  latanoprost (XALATAN) 0.005 % ophthalmic solution Place 1 drop into both eyes at bedtime.    [provider]  metoprolol succinate (TOPROL-XL) 25 MG 24 hr tablet TAKE 1 TABLET (25 MG TOTAL) BY MOUTH 2 (TWO) TIMES DAILY WITH A MEAL. 12/20/19   Jettie Booze, MD  Multiple Vitamin (MULTIVITAMIN WITH MINERALS) TABS tablet Take 1 tablet by mouth daily. 08/11/19   Little Ishikawa, MD  nitroGLYCERIN (NITROSTAT) 0.4 MG SL tablet Place 1 tablet (0.4 mg total) under the tongue every 5 (five) minutes x 3 doses as needed for chest pain. 11/22/18   Jettie Booze, MD  OVER THE COUNTER MEDICATION Take 2 capsules by mouth at bedtime. Omega XL    [provider]  sacubitril-valsartan (ENTRESTO) 24-26 MG Take 1 tablet by mouth 2 (two) times daily. 10/30/19   Jettie Booze, MD    Physical Exam:  Constitutional: Elderly male who appears  to be in some distress Vitals:   02/15/20 1148 02/15/20 1200 02/15/20 1245 02/15/20 1300  BP:  (!) 133/106 (!) 132/93 (!) 136/94  Pulse:  86 70 81  Resp:  (!) 21 18 20   Temp:      TempSrc:      SpO2:  100% 90% 100%  Weight: 87.6 kg     Height: 5\' 9"  (1.753 m)      Eyes: PERRL, lids and conjunctivae normal ENMT: Mucous membranes are moist. Posterior pharynx clear of any exudate or lesions.poor dentition. Neck: normal, supple, no masses, no thyromegaly.  Positive JVD Respiratory: Decreased overall aeration with some intermittent crackles heard at the bases.  Patient was on room air, but was talking in very short sentences with increased work of breathing.  Patient placed on nasal cannula oxygen Cardiovascular: Irregular irregular. 3+ pitting bilateral lower extremity edema. 2+ pedal pulses. No carotid bruits.  Abdomen: no tenderness, no masses palpated. No hepatosplenomegaly. Bowel sounds positive.  Musculoskeletal: no clubbing / cyanosis. No joint deformity upper and lower extremities. Good ROM, no contractures. Normal muscle tone.  Skin: Mild erythema noted of the right leg with healing superficial wounds    Neurologic: CN 2-12 grossly intact. Patient able to move all extremities 4.  Psychiatric: Normal judgment and insight. Alert and oriented x 3. Normal mood.     Labs on Admission: I have personally reviewed following labs and imaging studies  CBC: Recent Labs  Lab 02/15/20 1202  WBC 5.3  NEUTROABS 3.6  HGB 13.1  HCT 39.5  MCV 90.8  PLT 734   Basic Metabolic Panel: Recent Labs  Lab 02/15/20 1202  NA 144  K 3.3*  CL 107  CO2 27  GLUCOSE 96  BUN 25*  CREATININE 1.64*  CALCIUM 8.7*  MG 2.0   GFR: Estimated Creatinine Clearance: 36.1 mL/min (A) (by C-G formula based on SCr of 1.64 mg/dL (H)). Liver Function Tests: Recent Labs  Lab 02/15/20 1202  AST 24  ALT 23  ALKPHOS 69  BILITOT 1.4*  PROT 6.5  ALBUMIN 3.0*   No results for input(s): LIPASE, AMYLASE  in the last 168 hours. No results for input(s): AMMONIA in the last 168 hours. Coagulation Profile: No results for input(s): INR, PROTIME in the last 168 hours. Cardiac Enzymes: No results for input(s): CKTOTAL, CKMB, CKMBINDEX, TROPONINI in the last 168 hours. BNP (last 3 results) No results for input(s): PROBNP in the last 8760 hours. HbA1C: No results for input(s): HGBA1C in the last 72  hours. CBG: No results for input(s): GLUCAP in the last 168 hours. Lipid Profile: No results for input(s): CHOL, HDL, LDLCALC, TRIG, CHOLHDL, LDLDIRECT in the last 72 hours. Thyroid Function Tests: No results for input(s): TSH, T4TOTAL, FREET4, T3FREE, THYROIDAB in the last 72 hours. Anemia Panel: No results for input(s): VITAMINB12, FOLATE, FERRITIN, TIBC, IRON, RETICCTPCT in the last 72 hours. Urine analysis:    Component Value Date/Time   COLORURINE AMBER (A) 08/31/2019 1322   APPEARANCEUR HAZY (A) 08/31/2019 1322   LABSPEC 1.025 08/31/2019 1322   PHURINE 5.0 08/31/2019 1322   GLUCOSEU NEGATIVE 08/31/2019 1322   HGBUR SMALL (A) 08/31/2019 1322   BILIRUBINUR NEGATIVE 08/31/2019 1322   KETONESUR NEGATIVE 08/31/2019 1322   PROTEINUR 100 (A) 08/31/2019 1322   UROBILINOGEN 0.2 01/27/2011 1233   NITRITE NEGATIVE 08/31/2019 1322   LEUKOCYTESUR NEGATIVE 08/31/2019 1322   Sepsis Labs: No results found for this or any previous visit (from the past 240 hour(s)).   Radiological Exams on Admission: DG Chest Port 1 View  Result Date: 02/15/2020 CLINICAL DATA:  Shortness of breath. EXAM: PORTABLE CHEST 1 VIEW COMPARISON:  Multiple priors, most recent September 05, 2019. FINDINGS: Enlarged cardiac silhouette with central pulmonary vascular congestion. Loop recorder projects over the left chest. Both lungs are clear. No visible pleural effusions or pneumothorax. No acute osseous abnormality. IMPRESSION: Cardiomegaly and central pulmonary vascular congestion without overt pulmonary edema. Electronically  Signed   By: Margaretha Sheffield MD   On: 02/15/2020 12:52   VAS [place] LOWER EXTREMITY VENOUS (DVT) (ONLY [facility] & [facility] 7a-7p)  Result Date: 02/15/2020  Lower Venous DVT Study Indications: Swelling and pain in lower extremities. RT>LT.  Limitations: Patient intolerant to compressions in calf. Comparison Study: No prior studies. Performing Technologist: Darlin Coco RDMS  Examination Guidelines: A complete evaluation includes B-mode imaging, spectral Doppler, color Doppler, and power Doppler as needed of all accessible portions of each vessel. Bilateral testing is considered an integral part of a complete examination. Limited examinations for reoccurring indications may be performed as noted. The reflux portion of the exam is performed with the patient in reverse Trendelenburg.  +---------+---------------+---------+-----------+----------+--------------+ RIGHT    CompressibilityPhasicitySpontaneityPropertiesThrombus Aging +---------+---------------+---------+-----------+----------+--------------+ CFV      Full           No       Yes                                 +---------+---------------+---------+-----------+----------+--------------+ SFJ      Full                                                        +---------+---------------+---------+-----------+----------+--------------+ FV Prox  Full                                                        +---------+---------------+---------+-----------+----------+--------------+ FV Mid   Full                                                        +---------+---------------+---------+-----------+----------+--------------+  FV DistalFull                                                        +---------+---------------+---------+-----------+----------+--------------+ PFV      Full                                                        +---------+---------------+---------+-----------+----------+--------------+ POP      Full            No       Yes                                 +---------+---------------+---------+-----------+----------+--------------+ PTV                     No       Yes                                 +---------+---------------+---------+-----------+----------+--------------+ PERO                    No       Yes                                 +---------+---------------+---------+-----------+----------+--------------+   +----+---------------+---------+-----------+----------+--------------+ LEFTCompressibilityPhasicitySpontaneityPropertiesThrombus Aging +----+---------------+---------+-----------+----------+--------------+ CFV Full           Yes      Yes                                 +----+---------------+---------+-----------+----------+--------------+     Summary: RIGHT: - There is no evidence of deep vein thrombosis in the lower extremity. However, portions of this examination were limited- see technologist comments above.  - Waveforms consistent with elevated right heart pressures. - Ultrasound characteristics of enlarged lymph nodes are noted in the groin.  LEFT: - No evidence of common femoral vein obstruction. - Ultrasound characteristics of enlarged lymph nodes noted in the groin.  *See table(s) above for measurements and observations.    Preliminary     EKG: Independently reviewed.  Atrial fibrillation 80 bpm with QTC 582  Assessment/Plan  Combined systolic congestive heart failure exacerbation: Acute on chronic.  Patient presents with complaints of progressively worsening lower extremity swelling over the last 3 to 4 weeks to the point which she was unable to get up today. Chest x-ray significant for cardiomegaly with pulmonary vascular congestion. BNP was elevated at 2983.2.  Patient had initially been given 80 mg of Lasix IV.  His last EF was noted to be around 20% by echocardiogram in 08/2019. -Admit to a telemetry bed -Heart failure orders set  initiated  -Continuous  pulse oximetry with nasal cannula oxygen as needed to keep O2 saturations >92% -Strict I&Os and daily weights -Elevate lower extremities -Lasix 40 mg IV Bid -Reassess in a.m. and adjust diuresis as needed. -Check echocardiogram -Continue BB -Physical therapy to evaluate and treat -Message sent  to courtesy notify Dr. Beau Fanny of the patient being admitted  Suspect cellulitis of the right leg: Acute.  Noted to have significant erythema of the right leg with increased warmth and healing wounds.  Doppler ultrasound of the lower extremity showed no signs of a DVT question of possibility of underlying infection.  Patient without elevated white blood cell count and currently afebrile. -Add on CRP -Rocephin IV  Possible acute kidney injury superimposed on chronic kidney disease 3B: Patient presents with creatinine elevated 1.64 with BUN 25.  Patient baseline creatinine previously noted to be around 1.3 upon reviewing previous records. -Held Wal[place] due to decreased kidney -Continue to monitor kidney function with diuresis  Paroxysmal atrial fibrillation: Present and was noted to be in atrial fibrillation currently rate controlled.CHA2DS2-VASc score = 7. -Order placed to interrogate loop recorder -Goal potassium 4 and magnesium 2 -Continue Eliquis at reduced dose due to reduced kidney  Coronary artery disease:Last left heart cath performed on 10/19/2018 noting severe one-vessel coronary artery disease with subtotal thrombotic occlusion of the distal OM into trifurcation of 3 terminal branches, but was not suitable for PCI.  There was also moderate ostial left circumflex and proximal LAD disease.  Aggressive medical management was recommended at that time. -Continue statin and beta-blocker  Hypokalemia: Potassium 3.3 on admission -Give potassium chloride p.o.40 mEq x1 dose -Monitor and replace as  History of CVA ICH -Continue Eliquis and statin  Dementia: Patient admits that he has some  issues with his memory.  However patient appears to live alone and has a godson that comes in to check on him from time to time. -Transitions of care consult as patient likely needs placement  Tobacco abuse: Patient reports that he quit smoking within the last several weeks. -Encouraged continued cessation of tobacco use DVT prophylaxis: Eliquis Code Status: Full Family Communication: Daughter updated  Disposition Plan: To be determined Consults called: None Admission status: Inpatient, require more than 2 midnight stay  Norval Morton MD [facility]   If 7PM-7AM, please contact night-coverage   02/15/2020, 2:27 PM

## 2020-02-16 ENCOUNTER — Encounter (HOSPITAL_COMMUNITY): Payer: Self-pay | Admitting: Internal Medicine

## 2020-02-16 ENCOUNTER — Other Ambulatory Visit: Payer: Self-pay

## 2020-02-16 ENCOUNTER — Inpatient Hospital Stay (HOSPITAL_COMMUNITY): Payer: Medicare HMO

## 2020-02-16 DIAGNOSIS — I5023 Acute on chronic systolic (congestive) heart failure: Secondary | ICD-10-CM | POA: Diagnosis not present

## 2020-02-16 DIAGNOSIS — I361 Nonrheumatic tricuspid (valve) insufficiency: Secondary | ICD-10-CM

## 2020-02-16 DIAGNOSIS — I340 Nonrheumatic mitral (valve) insufficiency: Secondary | ICD-10-CM | POA: Diagnosis not present

## 2020-02-16 DIAGNOSIS — I5043 Acute on chronic combined systolic (congestive) and diastolic (congestive) heart failure: Secondary | ICD-10-CM | POA: Diagnosis not present

## 2020-02-16 LAB — ECHOCARDIOGRAM COMPLETE
Height: 69 in
MV M vel: 4.06 m/s
MV Peak grad: 65.9 mmHg
Radius: 0.3 cm
S' Lateral: 4.2 cm
Weight: 3091.2 oz

## 2020-02-16 LAB — BASIC METABOLIC PANEL
Anion gap: 10 (ref 5–15)
BUN: 24 mg/dL — ABNORMAL HIGH (ref 8–23)
CO2: 29 mmol/L (ref 22–32)
Calcium: 8.9 mg/dL (ref 8.9–10.3)
Chloride: 104 mmol/L (ref 98–111)
Creatinine, Ser: 1.57 mg/dL — ABNORMAL HIGH (ref 0.61–1.24)
GFR, Estimated: 43 mL/min — ABNORMAL LOW (ref >60)
Glucose, Bld: 99 mg/dL (ref 70–99)
Potassium: 3.4 mmol/L — ABNORMAL LOW (ref 3.5–5.1)
Sodium: 143 mmol/L (ref 135–145)

## 2020-02-16 LAB — C-REACTIVE PROTEIN: CRP: 0.6 mg/dL (ref <1.0)

## 2020-02-16 LAB — TSH: TSH: 2.437 u[IU]/mL (ref 0.350–4.500)

## 2020-02-16 LAB — VITAMIN B12: Vitamin B-12: 378 pg/mL (ref 180–914)

## 2020-02-16 MED ORDER — POTASSIUM CHLORIDE CRYS ER 20 MEQ PO TBCR
40.0000 meq | EXTENDED_RELEASE_TABLET | Freq: Once | ORAL | Status: AC
  Administered 2020-02-16: 40 meq via ORAL
  Filled 2020-02-16: qty 2

## 2020-02-16 MED ORDER — VITAMIN B-12 100 MCG PO TABS
100.0000 ug | ORAL_TABLET | Freq: Every day | ORAL | Status: DC
  Administered 2020-02-16 – 2020-02-20 (×5): 100 ug via ORAL
  Filled 2020-02-16 (×5): qty 1

## 2020-02-16 NOTE — Progress Notes (Signed)
PT Cancellation Note  Patient Details Name: Brian Brown MRN: [id number] DOB: 12/13/1932   Cancelled Treatment:    Reason Eval/Treat Not Completed: Patient at procedure or test/unavailable.  Made two attempts to see pt who was with staff and being transported to a test, at different times.  Retry as time and pt allow.   Ramond Dial 02/16/2020, 5:43 PM  Mee Hives, PT MS Acute Rehab Dept. Number: [place] [facility] [place]

## 2020-02-16 NOTE — ED Notes (Signed)
  Came out of another patients room to find patient walking down the hallway getting dressed and trying to go home.  Patient had removed cardiac monitor and pulled out his PIV.  Patient was assisted back into the room and sat in a chair.  Complete linen change was performed and patient was placed back in bed.  Another PIV was inserted and patient was settled in the bed.

## 2020-02-16 NOTE — ED Notes (Signed)
Lunch Tray Ordered @ 1030. 

## 2020-02-16 NOTE — ED Notes (Signed)
Breakfast ordered 

## 2020-02-16 NOTE — ED Notes (Signed)
Pt given breakfast tray

## 2020-02-16 NOTE — ED Notes (Signed)
Pt placed on bedpan

## 2020-02-16 NOTE — Progress Notes (Signed)
  Echocardiogram 2D Echocardiogram has been performed.  Brian Brown 02/16/2020, 10:43 AM

## 2020-02-16 NOTE — Plan of Care (Signed)
  Problem: Education: Goal: Ability to demonstrate management of disease process will improve Outcome: Not Progressing Goal: Ability to verbalize understanding of medication therapies will improve Outcome: Not Progressing Goal: Individualized Educational Video(s) Outcome: Not Progressing   Problem: Activity: Goal: Capacity to carry out activities will improve Outcome: Not Progressing   Problem: Cardiac: Goal: Ability to achieve and maintain adequate cardiopulmonary perfusion will improve Outcome: Progressing

## 2020-02-16 NOTE — Progress Notes (Signed)
PROGRESS NOTE    Brian Brown  HER:[id number] DOB: May 21, 1932 DOA: 02/15/2020 PCP: Seward Carol, MD   Brief Narrative: 87 year old with past medical history significant for CVA with residual right-sided weakness, hypertension, CAD status post non-STEMI, systolic heart failure ejection fraction 20%, paroxysmal A. fib, history of ICH presents with complaining of leg swelling and weakness over the last 3 to 4 weeks.  Patient reports that he lives alone and give himself his medications.  Sometimes he forgets to take medication at the scheduled time and therefore misses doses.  He is caught someone live nearby and check on him.  He had gotten weaker to the point in which he was unable to get up and move around today.  Evaluation in the ED: chest x-ray showed cardiomegaly with central pulmonary vascular congestion, Doppler negative for DVT, BNP 2983, creatinine 1.6.     Assessment & Plan:   Principal Problem:   CHF (congestive heart failure) (HCC) Active Problems:   Hypokalemia   PAF (paroxysmal atrial fibrillation) (HCC)   History of CVA (cerebrovascular accident)   Coronary artery disease   Prolonged QT interval   Dementia (HCC)   Cellulitis of right leg  1-Acute on chronic combined systolic heart failure exacerbation: -Patient presented with shortness of breath, lower extremity edema, weakness.  Chest x-ray showed pulmonary vascular congestion, BNP elevated at 2983. -Patient forgets sometimes to take his medications. -Continue with IV Lasix.  -FU ECHO: EF 20 % mitral and tricuspid valve moderate regurgitation.  -negative 1.8 L.   2-Cellulitis of right leg: -Doppler negative for DVT. -On IV Rocephin, continue.   3-AKI on CKD stage IIIb: Creatinine baseline 1.3 Presented  with a creatinine 1.6 Agree with holding Entresto.  4-Paroxysmal A. Fib: -Loop recorder interrogation: -Continue Eliquis.   5-CAD; Statins and beta-blocker  6-Hypokalemia: Replete orally.    History of CVA/ ICH Continue with statins.   Dementia: Patient admits he has some issues with his memory. Patient lives alone, has got son that check on him. Patient will benefit from skilled rehab Check TSH and B 12. Normal.   Tobacco abuse: Patient reported quitting several weeks ago Inguinal lymph node. Needs follow up.     Estimated body mass index is 28.53 kg/m as calculated from the following:   Height as of this encounter: 5\' 9"  (1.753 m).   Weight as of this encounter: 87.6 kg.   DVT prophylaxis: Eliquis Code Status: Full code Family Communication: Daughter over phone.  Disposition Plan:  Status is: Inpatient  Remains inpatient appropriate because:IV treatments appropriate due to intensity of illness or inability to take PO   Dispo: The patient is from: Home              Anticipated d/c is to: SNF              Anticipated d/c date is: 2 days              Patient currently is not medically stable to d/c.   Difficult to place patient No        Consultants:   none  Procedures:   Doppler; negative  Antimicrobials:    Subjective: He report breathing a little better. He denies chest pain    Objective: Vitals:   02/16/20 0400 02/16/20 0430 02/16/20 0530 02/16/20 0607  BP: (!) 140/105 (!) 144/105 (!) 135/101   Pulse: 92 95  99  Resp: (!) 23 (!) 25  (!) 23  Temp:      TempSrc:  SpO2: 100% 100%  92%  Weight:      Height:        Intake/Output Summary (Last 24 hours) at 02/16/2020 0732 Last data filed at 02/16/2020 7353 Gross per 24 hour  Intake 800 ml  Output 2601 ml  Net -1801 ml   Filed Weights   02/15/20 1148  Weight: 87.6 kg    Examination:  General exam: Appears calm and comfortable  Respiratory system: Clear to auscultation. Respiratory effort normal. Cardiovascular system: S1 & S2 heard, RRR.  Gastrointestinal system: Abdomen is nondistended, soft and nontender. No organomegaly or masses felt. Normal bowel sounds  heard. Central nervous system: Alert and oriented.  Extremities: Plus 2 edema, RLE redness.    Data Reviewed: I have personally reviewed following labs and imaging studies  CBC: Recent Labs  Lab 02/15/20 1202  WBC 5.3  NEUTROABS 3.6  HGB 13.1  HCT 39.5  MCV 90.8  PLT 299   Basic Metabolic Panel: Recent Labs  Lab 02/15/20 1202 02/16/20 0319  NA 144 143  K 3.3* 3.4*  CL 107 104  CO2 27 29  GLUCOSE 96 99  BUN 25* 24*  CREATININE 1.64* 1.57*  CALCIUM 8.7* 8.9  MG 2.0  --    GFR: Estimated Creatinine Clearance: 37.7 mL/min (A) (by C-G formula based on SCr of 1.57 mg/dL (H)). Liver Function Tests: Recent Labs  Lab 02/15/20 1202  AST 24  ALT 23  ALKPHOS 69  BILITOT 1.4*  PROT 6.5  ALBUMIN 3.0*   No results for input(s): LIPASE, AMYLASE in the last 168 hours. No results for input(s): AMMONIA in the last 168 hours. Coagulation Profile: No results for input(s): INR, PROTIME in the last 168 hours. Cardiac Enzymes: No results for input(s): CKTOTAL, CKMB, CKMBINDEX, TROPONINI in the last 168 hours. BNP (last 3 results) No results for input(s): PROBNP in the last 8760 hours. HbA1C: No results for input(s): HGBA1C in the last 72 hours. CBG: No results for input(s): GLUCAP in the last 168 hours. Lipid Profile: No results for input(s): CHOL, HDL, LDLCALC, TRIG, CHOLHDL, LDLDIRECT in the last 72 hours. Thyroid Function Tests: No results for input(s): TSH, T4TOTAL, FREET4, T3FREE, THYROIDAB in the last 72 hours. Anemia Panel: No results for input(s): VITAMINB12, FOLATE, FERRITIN, TIBC, IRON, RETICCTPCT in the last 72 hours. Sepsis Labs: No results for input(s): PROCALCITON, LATICACIDVEN in the last 168 hours.  Recent Results (from the past 240 hour(s))  SARS CORONAVIRUS 2 (TAT 6-24 HRS) Nasopharyngeal Nasopharyngeal Swab     Status: None   Collection Time: 02/15/20  2:37 PM   Specimen: Nasopharyngeal Swab  Result Value Ref Range Status   SARS Coronavirus 2  NEGATIVE NEGATIVE Final    Comment: (NOTE) SARS-CoV-2 target nucleic acids are NOT DETECTED.  The SARS-CoV-2 RNA is generally detectable in upper and lower respiratory specimens during the acute phase of infection. Negative results do not preclude SARS-CoV-2 infection, do not rule out co-infections with other pathogens, and should not be used as the sole basis for treatment or other patient management decisions. Negative results must be combined with clinical observations, patient history, and epidemiological information. The expected result is Negative.  Fact Sheet for Patients: SugarRoll.be  Fact Sheet for Healthcare Providers: https://www.woods-mathews.com/  This test is not yet approved or cleared by the [place] FDA and  has been authorized for detection and/or diagnosis of SARS-CoV-2 by FDA under an Emergency Use Authorization (EUA). This EUA will remain  in effect (meaning this test can be used)  for the duration of the COVID-19 declaration under Se ction 564(b)(1) of the Act, 21 U.S.C. section 360bbb-3(b)(1), unless the authorization is terminated or revoked sooner.  Performed at [facility], [place] [address], [place], [place] [address]          Radiology Studies: DG Chest Port 1 View  Result Date: 02/15/2020 CLINICAL DATA:  Shortness of breath. EXAM: PORTABLE CHEST 1 VIEW COMPARISON:  Multiple priors, most recent September 05, 2019. FINDINGS: Enlarged cardiac silhouette with central pulmonary vascular congestion. Loop recorder projects over the left chest. Both lungs are clear. No visible pleural effusions or pneumothorax. No acute osseous abnormality. IMPRESSION: Cardiomegaly and central pulmonary vascular congestion without overt pulmonary edema. Electronically Signed   By: Margaretha Sheffield MD   On: 02/15/2020 12:52   VAS [place] LOWER EXTREMITY VENOUS (DVT) (ONLY [facility] & [facility] 7a-7p)  Result Date: 02/15/2020  Lower Venous DVT  Study Indications: Swelling and pain in lower extremities. RT>LT.  Limitations: Patient intolerant to compressions in calf. Comparison Study: No prior studies. Performing Technologist: Darlin Coco RDMS  Examination Guidelines: A complete evaluation includes B-mode imaging, spectral Doppler, color Doppler, and power Doppler as needed of all accessible portions of each vessel. Bilateral testing is considered an integral part of a complete examination. Limited examinations for reoccurring indications may be performed as noted. The reflux portion of the exam is performed with the patient in reverse Trendelenburg.  +---------+---------------+---------+-----------+----------+--------------+ RIGHT    CompressibilityPhasicitySpontaneityPropertiesThrombus Aging +---------+---------------+---------+-----------+----------+--------------+ CFV      Full           No       Yes                                 +---------+---------------+---------+-----------+----------+--------------+ SFJ      Full                                                        +---------+---------------+---------+-----------+----------+--------------+ FV Prox  Full                                                        +---------+---------------+---------+-----------+----------+--------------+ FV Mid   Full                                                        +---------+---------------+---------+-----------+----------+--------------+ FV DistalFull                                                        +---------+---------------+---------+-----------+----------+--------------+ PFV      Full                                                        +---------+---------------+---------+-----------+----------+--------------+  POP      Full           No       Yes                                 +---------+---------------+---------+-----------+----------+--------------+ PTV                     No        Yes                                 +---------+---------------+---------+-----------+----------+--------------+ PERO                    No       Yes                                 +---------+---------------+---------+-----------+----------+--------------+   +----+---------------+---------+-----------+----------+--------------+ LEFTCompressibilityPhasicitySpontaneityPropertiesThrombus Aging +----+---------------+---------+-----------+----------+--------------+ CFV Full           Yes      Yes                                 +----+---------------+---------+-----------+----------+--------------+     Summary: RIGHT: - There is no evidence of deep vein thrombosis in the lower extremity. However, portions of this examination were limited- see technologist comments above.  - Waveforms consistent with elevated right heart pressures. - Ultrasound characteristics of enlarged lymph nodes are noted in the groin.  LEFT: - No evidence of common femoral vein obstruction. - Ultrasound characteristics of enlarged lymph nodes noted in the groin.  *See table(s) above for measurements and observations. Electronically signed by Servando Snare MD on 02/15/2020 at 4:20:21 PM.    Final         Scheduled Meds: . apixaban  2.5 mg Oral BID  . atorvastatin  40 mg Oral QPM  . famotidine  20 mg Oral QHS  . furosemide  40 mg Intravenous BID  . latanoprost  1 drop Both Eyes QHS  . metoprolol succinate  25 mg Oral BID WC  . multivitamin with minerals  1 tablet Oral Daily  . saccharomyces boulardii  250 mg Oral BID  . sodium chloride flush  3 mL Intravenous Q12H   Continuous Infusions: . sodium chloride    . cefTRIAXone (ROCEPHIN)  IV Stopped (02/15/20 1820)     LOS: 1 day    Time spent: 35 minutes.     Elmarie Shiley, MD [facility]   If 7PM-7AM, please contact night-coverage [email or web address]  02/16/2020, 7:32 AM

## 2020-02-16 NOTE — TOC Initial Note (Signed)
Transition of Care [facility]) - Initial/Assessment Note    Patient Details  Name: Brian Brown MRN: [id number] Date of Birth: 1932/09/17  Transition of Care [facility]) CM/SW Contact:    Zenon Mayo, RN Phone Number: 02/16/2020, 3:38 PM  Clinical Narrative:                 NCM spoke with patient at bedside, he states he lives alone, but he has two sisters who live next door.  He has a rollator, a scale, w/chair and a BSC at home.  He states [name] a friend who takes care of his dogs and cuts his grass, will take him to his MD appts.  He states he has only missed about two apts.   He states he gets meals on wheels also.  NCM informed him that pt will work with him to see what his needs are , [place] vs SNF vs CIR.  TOC team will continuw to follow for dc needs.   Expected Discharge Plan: [place] Barriers to Discharge: Continued Medical Work up   Patient Goals and CMS Choice Patient states their goals for this hospitalization and ongoing recovery are:: get better CMS Medicare.gov Compare Post Acute Care list provided to:: Patient Choice offered to / list presented to : Patient  Expected Discharge Plan and Services Expected Discharge Plan: [place]   Discharge Planning Services: CM Consult,NA   Living arrangements for the past 2 months: Single Family Home                   DME Agency: NA       HH Arranged: NA          Prior Living Arrangements/Services Living arrangements for the past 2 months: Single Family Home Lives with:: Self Patient language and need for interpreter reviewed:: Yes Do you feel safe going back to the place where you live?: Yes      Need for Family Participation in Patient Care: No (Comment) Care giver support system in place?: Yes (comment) (two sisters next door) Current home services: DME (he has rollator, scale, w/chair and BSC at home) Criminal Activity/Legal Involvement Pertinent to Current  Situation/Hospitalization: No - Comment as needed  Activities of Daily Living Home Assistive Devices/Equipment: Environmental consultant (specify type),Cane (specify quad or straight) ADL Screening (condition at time of admission) Patient's cognitive ability adequate to safely complete daily activities?: Yes Is the patient deaf or have difficulty hearing?: No Does the patient have difficulty seeing, even when wearing glasses/contacts?: No Does the patient have difficulty concentrating, remembering, or making decisions?: Yes Patient able to express need for assistance with ADLs?: Yes Does the patient have difficulty dressing or bathing?: No Independently performs ADLs?: Yes (appropriate for developmental age) Does the patient have difficulty walking or climbing stairs?: Yes Weakness of Legs: Both Weakness of Arms/Hands: None  Permission Sought/Granted                  Emotional Assessment Appearance:: Appears stated age Attitude/Demeanor/Rapport: Engaged Affect (typically observed): Appropriate Orientation: : Oriented to Self,Oriented to Place,Oriented to  Time,Oriented to Situation Alcohol / Substance Use: Not Applicable Psych Involvement: No (comment)  Admission diagnosis:  Hypokalemia [E87.6] CHF (congestive heart failure) ([place]) [I50.9] Peripheral edema [R60.9] Cellulitis of right lower leg [L03.115] Acute on chronic combined systolic and diastolic CHF (congestive heart failure) ([place]) [I50.43] Patient Active Problem List   Diagnosis Date Noted  . CHF (congestive heart failure) ([place]) 02/15/2020  .  Prolonged QT interval 02/15/2020  . Dementia ([place]) 02/15/2020  . Cellulitis of right leg 02/15/2020  . Goals of care, counseling/discussion   . Palliative care by specialist   . Persistent atrial fibrillation ([place])   . Coronary artery disease   . Acute systolic heart failure ([place]) 08/31/2019  . Acute on chronic systolic CHF (congestive heart failure) ([place]) 08/31/2019  . AMS (altered mental  status) 08/31/2019  . Acute respiratory failure with hypoxia ([place]) 08/31/2019  . Lactic acidosis 08/31/2019  . History of intracerebral hemorrhage without residual deficit 08/31/2019  . Protein-calorie malnutrition, severe 08/08/2019  . Intracerebral hemorrhage ([place]) 08/07/2019  . Acute CHF (congestive heart failure) ([place]) 08/06/2019  . AKI (acute kidney injury) ([place]) 08/06/2019  . Elevated troponin 08/06/2019  . Transaminitis 08/06/2019  . Closed rib fracture 08/06/2019  . History of CVA (cerebrovascular accident) 08/06/2019  . PAF (paroxysmal atrial fibrillation) ([place]) 10/20/2018  . NSTEMI (non-ST elevated myocardial infarction) ([place])   . Acute CVA (cerebrovascular accident) ([place]) 02/14/2018  . TIA (transient ischemic attack) 02/13/2018  . Hepatitis C reactive 02/06/2011  . Hyponatremia 02/06/2011  . Postoperative anemia due to acute blood loss 02/05/2011  . Hypokalemia 02/05/2011  . Osteoarthritis of hip 01/27/2011  . Hypertension 01/27/2011  . Sleep apnea 01/27/2011  . Stroke/cerebrovascular accident ([place]) 01/27/2011  . Cancer of prostate ([place]) 01/27/2011   PCP:  Seward Carol, MD Pharmacy:   [id number] - [place], [place] - [address] [place] [address] Phone: [phone] Fax: [phone]  [facility] - [place], [place] - [address] [place] [address] Phone: [phone] Fax: [phone]  [facility] [place], [place] [address] [place] [address] Phone: [phone] Fax: [phone]  [facility] [phone] [place]lyn Hay, [place] [address] Phone: [phone] Fax: [phone]     Social Determinants of Health (SDOH) Interventions    Readmission Risk Interventions Readmission Risk Prevention Plan 09/04/2019  Transportation  Screening Complete  PCP or Specialist Appt within 3-5 Days Complete  HRI or Home Care Consult Complete  Social Work Consult for [place] Planning/Counseling Complete  Palliative Care Screening Not Applicable  Medication Review Press photographer) Complete  Some recent data might be hidden

## 2020-02-17 DIAGNOSIS — I5043 Acute on chronic combined systolic (congestive) and diastolic (congestive) heart failure: Secondary | ICD-10-CM | POA: Diagnosis not present

## 2020-02-17 LAB — CBC
HCT: 36.3 % — ABNORMAL LOW (ref 39.0–52.0)
Hemoglobin: 12.5 g/dL — ABNORMAL LOW (ref 13.0–17.0)
MCH: 30.3 pg (ref 26.0–34.0)
MCHC: 34.4 g/dL (ref 30.0–36.0)
MCV: 87.9 fL (ref 80.0–100.0)
Platelets: 195 10*3/uL (ref 150–400)
RBC: 4.13 MIL/uL — ABNORMAL LOW (ref 4.22–5.81)
RDW: 14.8 % (ref 11.5–15.5)
WBC: 7.8 10*3/uL (ref 4.0–10.5)
nRBC: 0 % (ref 0.0–0.2)

## 2020-02-17 LAB — BASIC METABOLIC PANEL
Anion gap: 12 (ref 5–15)
BUN: 26 mg/dL — ABNORMAL HIGH (ref 8–23)
CO2: 23 mmol/L (ref 22–32)
Calcium: 9 mg/dL (ref 8.9–10.3)
Chloride: 105 mmol/L (ref 98–111)
Creatinine, Ser: 1.36 mg/dL — ABNORMAL HIGH (ref 0.61–1.24)
GFR, Estimated: 51 mL/min — ABNORMAL LOW (ref >60)
Glucose, Bld: 100 mg/dL — ABNORMAL HIGH (ref 70–99)
Potassium: 3.8 mmol/L (ref 3.5–5.1)
Sodium: 140 mmol/L (ref 135–145)

## 2020-02-17 MED ORDER — POTASSIUM CHLORIDE CRYS ER 20 MEQ PO TBCR
20.0000 meq | EXTENDED_RELEASE_TABLET | Freq: Once | ORAL | Status: AC
  Administered 2020-02-17: 20 meq via ORAL
  Filled 2020-02-17: qty 1

## 2020-02-17 MED ORDER — FUROSEMIDE 10 MG/ML IJ SOLN
20.0000 mg | Freq: Once | INTRAMUSCULAR | Status: AC
  Administered 2020-02-17: 20 mg via INTRAVENOUS
  Filled 2020-02-17: qty 2

## 2020-02-17 NOTE — Progress Notes (Signed)
RN in to reassess patient and noticed that BP cuff and IV on the floor. Bed sheets covered in blood and urine. Patient confused only alert to self at this time. Linen changed and patient cleaned. RN attempted to restart IV but unsuccessful. IV team consult placed.   RN will continue to assess.

## 2020-02-17 NOTE — Progress Notes (Signed)
PROGRESS NOTE    Brian Brown  HQI:[id number] DOB: April 20, 1932 DOA: 02/15/2020 PCP: Seward Carol, MD   Brief Narrative: 87 year old with past medical history significant for CVA with residual right-sided weakness, hypertension, CAD status post non-STEMI, systolic heart failure ejection fraction 20%, paroxysmal A. fib, history of ICH presents with complaining of leg swelling and weakness over the last 3 to 4 weeks.  Patient reports that he lives alone and give himself his medications.  Sometimes he forgets to take medication at the scheduled time and therefore misses doses.  He is caught someone live nearby and check on him.  He had gotten weaker to the point in which he was unable to get up and move around today.  Evaluation in the ED: chest x-ray showed cardiomegaly with central pulmonary vascular congestion, Doppler negative for DVT, BNP 2983, creatinine 1.6.     Assessment & Plan:   Principal Problem:   CHF (congestive heart failure) (HCC) Active Problems:   Hypokalemia   PAF (paroxysmal atrial fibrillation) (HCC)   History of CVA (cerebrovascular accident)   Coronary artery disease   Prolonged QT interval   Dementia (HCC)   Cellulitis of right leg  1-Acute on chronic combined systolic heart failure exacerbation: -Patient presented with shortness of breath, lower extremity edema, weakness.  Chest x-ray showed pulmonary vascular congestion, BNP elevated at 2983. -Patient forgets sometimes to take his medications. -Continue with IV Lasix.  -FU ECHO: EF 20 % mitral and tricuspid valve moderate regurgitation.  -Negative 1.9 L.  -Weight: 193---181 -He had episode of SOB while I was in the room, his oxygen was 85 on RA. He was placed on 3 L, which improved his dyspnea. I have order extra dose of lasix as well.  Plan to continue with IV lasix.   2-Cellulitis of right leg: -Doppler negative for DVT. -On IV Rocephin, continue. Day 2 -less redness on exam.   3-AKI on CKD stage  IIIb: Creatinine baseline 1.3 Presented  with a creatinine 1.6 Agree with holding Entresto. Renal function improved.   4-Paroxysmal A. Fib: -Loop recorder interrogation: -Continue Eliquis.   5-CAD; Statins and beta-blocker  6-Hypokalemia: Will provide supplement.   History of CVA/ ICH Continue with statins.   Dementia: Patient admits he has some issues with his memory. Patient lives alone, has got son that check on him. Patient will benefit from skilled rehab TSH 2.4, B 12 378  Tobacco abuse: Patient reported quitting several weeks ago Inguinal lymph node. Needs follow up.     Estimated body mass index is 26.76 kg/m as calculated from the following:   Height as of this encounter: 5\' 9"  (1.753 m).   Weight as of this encounter: 82.2 kg.   DVT prophylaxis: Eliquis Code Status: Full code Family Communication: Daughter who was at bedside.  Disposition Plan:  Status is: Inpatient  Remains inpatient appropriate because:IV treatments appropriate due to intensity of illness or inability to take PO   Dispo: The patient is from: Home              Anticipated d/c is to: SNF              Anticipated d/c date is: 2 days              Patient currently is not medically stable to d/c.   Difficult to place patient No        Consultants:   none  Procedures:   Doppler; negative  Antimicrobials:    Subjective:  He is breathing much better than on admission.  He appears SOB when he talks at time/  He had an episode of labor breathing, on checking his oxygen was low at 84 improved on 3 L.    Objective: Vitals:   02/16/20 2155 02/17/20 0000 02/17/20 0413 02/17/20 0904  BP: 124/88 (!) 119/93 (!) 115/94 108/75  Pulse:   86 83  Resp: 20  20 19   Temp: 97.7 F (36.5 C) 97.6 F (36.4 C) 98.5 F (36.9 C) 98.4 F (36.9 C)  TempSrc: Oral Oral Oral Oral  SpO2: 95% 100% 100% 100%  Weight:   82.2 kg   Height:        Intake/Output Summary (Last 24 hours) at  02/17/2020 1636 Last data filed at 02/17/2020 1500 Gross per 24 hour  Intake 840 ml  Output 1025 ml  Net -185 ml   Filed Weights   02/15/20 1148 02/17/20 0413  Weight: 87.6 kg 82.2 kg    Examination:  General exam: alert Respiratory system: BL crackles. Mild tachypnea.  Cardiovascular system: S 1, S 2 RRR Gastrointestinal system: BS present, soft, nt Central nervous system: alert and oriented.  Extremities: , less LE edema and redness    Data Reviewed: I have personally reviewed following labs and imaging studies  CBC: Recent Labs  Lab 02/15/20 1202 02/17/20 0415  WBC 5.3 7.8  NEUTROABS 3.6  --   HGB 13.1 12.5*  HCT 39.5 36.3*  MCV 90.8 87.9  PLT 220 742   Basic Metabolic Panel: Recent Labs  Lab 02/15/20 1202 02/16/20 0319 02/17/20 0415  NA 144 143 140  K 3.3* 3.4* 3.8  CL 107 104 105  CO2 27 29 23   GLUCOSE 96 99 100*  BUN 25* 24* 26*  CREATININE 1.64* 1.57* 1.36*  CALCIUM 8.7* 8.9 9.0  MG 2.0  --   --    GFR: Estimated Creatinine Clearance: 39.7 mL/min (A) (by C-G formula based on SCr of 1.36 mg/dL (H)). Liver Function Tests: Recent Labs  Lab 02/15/20 1202  AST 24  ALT 23  ALKPHOS 69  BILITOT 1.4*  PROT 6.5  ALBUMIN 3.0*   No results for input(s): LIPASE, AMYLASE in the last 168 hours. No results for input(s): AMMONIA in the last 168 hours. Coagulation Profile: No results for input(s): INR, PROTIME in the last 168 hours. Cardiac Enzymes: No results for input(s): CKTOTAL, CKMB, CKMBINDEX, TROPONINI in the last 168 hours. BNP (last 3 results) No results for input(s): PROBNP in the last 8760 hours. HbA1C: No results for input(s): HGBA1C in the last 72 hours. CBG: No results for input(s): GLUCAP in the last 168 hours. Lipid Profile: No results for input(s): CHOL, HDL, LDLCALC, TRIG, CHOLHDL, LDLDIRECT in the last 72 hours. Thyroid Function Tests: Recent Labs    02/16/20 0319  TSH 2.437   Anemia Panel: Recent Labs    02/16/20 0319   VITAMINB12 378   Sepsis Labs: No results for input(s): PROCALCITON, LATICACIDVEN in the last 168 hours.  Recent Results (from the past 240 hour(s))  SARS CORONAVIRUS 2 (TAT 6-24 HRS) Nasopharyngeal Nasopharyngeal Swab     Status: None   Collection Time: 02/15/20  2:37 PM   Specimen: Nasopharyngeal Swab  Result Value Ref Range Status   SARS Coronavirus 2 NEGATIVE NEGATIVE Final    Comment: (NOTE) SARS-CoV-2 target nucleic acids are NOT DETECTED.  The SARS-CoV-2 RNA is generally detectable in upper and lower respiratory specimens during the acute phase of infection. Negative results do not  preclude SARS-CoV-2 infection, do not rule out co-infections with other pathogens, and should not be used as the sole basis for treatment or other patient management decisions. Negative results must be combined with clinical observations, patient history, and epidemiological information. The expected result is Negative.  Fact Sheet for Patients: SugarRoll.be  Fact Sheet for Healthcare Providers: https://www.woods-mathews.com/  This test is not yet approved or cleared by the [place] FDA and  has been authorized for detection and/or diagnosis of SARS-CoV-2 by FDA under an Emergency Use Authorization (EUA). This EUA will remain  in effect (meaning this test can be used) for the duration of the COVID-19 declaration under Se ction 564(b)(1) of the Act, 21 U.S.C. section 360bbb-3(b)(1), unless the authorization is terminated or revoked sooner.  Performed at [facility], [place] [address] [place], [place] [address]          Radiology Studies: ECHOCARDIOGRAM COMPLETE  Result Date: 02/16/2020    ECHOCARDIOGRAM REPORT   Patient Name:   Brian Brown Date of Exam: 02/16/2020 Medical Rec #:  [id number]        Height:       69.0 in Accession #:    [id number]       Weight:       193.2 lb Date of Birth:  05-Aug-1932        BSA:          2.036 m  Patient Age:    50 years         BP:           128/100 mmHg Patient Gender: M                HR:           85 bpm. Exam Location:  Inpatient Procedure: 2D Echo Indications:    stroke  History:        Patient has prior history of Echocardiogram examinations, most                 recent 09/05/2019. CAD, Arrythmias:Atrial Fibrillation; Risk                 Factors:Hypertension.  Sonographer:    Johny Chess Referring Phys: [id number] RONDELL A SMITH IMPRESSIONS  1. LV apex well-visualized without contrast, no mural thrombus identified. Left ventricular ejection fraction, by estimation, is <20%. The left ventricle has severely decreased function. The left ventricle demonstrates global hypokinesis. There is mild left ventricular hypertrophy. Left ventricular diastolic function could not be evaluated. Elevated left ventricular end-diastolic pressure.  2. Right ventricular systolic function is moderately reduced. The right ventricular size is mildly enlarged. There is moderately elevated pulmonary artery systolic pressure. The estimated right ventricular systolic pressure is 67.3 mmHg.  3. Left atrial size was moderately dilated.  4. Right atrial size was severely dilated.  5. Moderate pleural effusion in the left lateral region.  6. The mitral valve is abnormal. Moderate mitral valve regurgitation.  7. Tricuspid valve regurgitation is moderate.  8. The aortic valve is tricuspid. Aortic valve regurgitation is not visualized. Mild aortic valve sclerosis is present, with no evidence of aortic valve stenosis.  9. The pulmonic valve was abnormal. 10. Mildly dilated pulmonary artery. 11. The inferior vena cava is dilated in size with <50% respiratory variability, suggesting right atrial pressure of 15 mmHg. Comparison(s): Changes from prior study are noted. 09/05/2019: LVEF 20%, global hypokinesis, moderate RV systolic dysfunction, RVSP 43.5 mmHg. FINDINGS  Left Ventricle: LV apex well-visualized without  contrast, no mural  thrombus identified. Left ventricular ejection fraction, by estimation, is <20%. The left ventricle has severely decreased function. The left ventricle demonstrates global hypokinesis. The left ventricular internal cavity size was normal in size. There is mild left ventricular hypertrophy. Left ventricular diastolic function could not be evaluated due to atrial fibrillation. Left ventricular diastolic function could not be evaluated. Elevated left ventricular end-diastolic pressure. Right Ventricle: The right ventricular size is mildly enlarged. No increase in right ventricular wall thickness. Right ventricular systolic function is moderately reduced. There is moderately elevated pulmonary artery systolic pressure. The tricuspid regurgitant velocity is 2.90 m/s, and with an assumed right atrial pressure of 15 mmHg, the estimated right ventricular systolic pressure is 85.2 mmHg. Left Atrium: Left atrial size was moderately dilated. Right Atrium: Right atrial size was severely dilated. Pericardium: There is no evidence of pericardial effusion. Mitral Valve: The mitral valve is abnormal. There is moderate thickening of the mitral valve leaflet(s). Moderate mitral valve regurgitation. Tricuspid Valve: The tricuspid valve is grossly normal. Tricuspid valve regurgitation is moderate. Aortic Valve: The aortic valve is tricuspid. Aortic valve regurgitation is not visualized. Mild aortic valve sclerosis is present, with no evidence of aortic valve stenosis. Pulmonic Valve: The pulmonic valve was abnormal. Pulmonic valve regurgitation is mild to moderate. Aorta: The aortic root and ascending aorta are structurally normal, with no evidence of dilitation. Pulmonary Artery: The pulmonary artery is mildly dilated. Venous: The inferior vena cava is dilated in size with less than 50% respiratory variability, suggesting right atrial pressure of 15 mmHg. IAS/Shunts: No atrial level shunt detected by color flow Doppler. Additional  Comments: There is a moderate pleural effusion in the left lateral region.  LEFT VENTRICLE PLAX 2D LVIDd:         4.60 cm  Diastology LVIDs:         4.20 cm  LV e' medial:  4.81 cm/s LV PW:         1.10 cm  LV e' lateral: 4.95 cm/s LV IVS:        1.10 cm LVOT diam:     2.00 cm LVOT Area:     3.14 cm  RIGHT VENTRICLE            IVC RV S prime:     5.77 cm/s  IVC diam: 2.70 cm TAPSE (M-mode): 0.9 cm LEFT ATRIUM             Index       RIGHT ATRIUM           Index LA diam:        4.80 cm 2.36 cm/m  RA Area:     29.20 cm LA Vol (A2C):   76.9 ml 37.77 ml/m RA Volume:   109.00 ml 53.54 ml/m LA Vol (A4C):   76.9 ml 37.77 ml/m LA Biplane Vol: 79.7 ml 39.15 ml/m   AORTA Ao Root diam: 3.20 cm Ao Asc diam:  3.60 cm MR Peak grad:    65.9 mmHg   TRICUSPID VALVE MR Mean grad:    41.0 mmHg   TR Peak grad:   33.6 mmHg MR Vmax:         406.00 cm/s TR Vmax:        290.00 cm/s MR Vmean:        297.0 cm/s MR PISA:         0.57 cm    SHUNTS MR PISA Eff ROA: 6 mm       Systemic  Diam: 2.00 cm MR PISA Radius:  0.30 cm Lyman Bishop MD Electronically signed by Lyman Bishop MD Signature Date/Time: 02/16/2020/11:35:43 AM    Final         Scheduled Meds: . apixaban  2.5 mg Oral BID  . atorvastatin  40 mg Oral QPM  . famotidine  20 mg Oral QHS  . furosemide  40 mg Intravenous BID  . latanoprost  1 drop Both Eyes QHS  . metoprolol succinate  25 mg Oral BID WC  . multivitamin with minerals  1 tablet Oral Daily  . saccharomyces boulardii  250 mg Oral BID  . sodium chloride flush  3 mL Intravenous Q12H  . vitamin B-12  100 mcg Oral Daily   Continuous Infusions: . sodium chloride    . cefTRIAXone (ROCEPHIN)  IV 2 g (02/16/20 1716)     LOS: 2 days    Time spent: 35 minutes.     Elmarie Shiley, MD [facility]   If 7PM-7AM, please contact night-coverage [email or web address]  02/17/2020, 4:36 PM

## 2020-02-17 NOTE — Evaluation (Signed)
Occupational Therapy Evaluation Patient Details Name: Brian Brown MRN: [id number] DOB: 1932/03/03 Today's Date: 02/17/2020    History of Present Illness 87 year old with past medical history significant for CVA with residual right-sided weakness, hypertension, CAD status post non-STEMI, systolic heart failure ejection fraction 20%, paroxysmal A. fib, history of ICH presents with complaining of leg swelling and weakness over the last 3 to 4 weeks. Chest x-ray showed cardiomegaly with central pulmonary vascular congestion. Admitted with acute on chronic combined systolic heart failure exacerbation.   Clinical Impression   Patient admitted with the above diagnosis.  PTA he was living alone, drove locally, and has intermittent assist from family.  Daughter was present and stated they helped with medication management and bills, and he had help with meals.  Dyspnea and decreased activity tolerance are the barriers impacting independence.  He is needing up to [facility] for basic mobility and up to [place] for lower body ADL with increased time and effort noted.  Patient self reports 3/4 DOE.  The daughter is planning for SNF closer to where she lives in [place], but OT will follow in the acute setting to maximize functional status.     Follow Up Recommendations  SNF    Equipment Recommendations  None recommended by OT    Recommendations for Other Services       Precautions / Restrictions Precautions Precautions: Fall Restrictions Weight Bearing Restrictions: No      Mobility Bed Mobility Overal bed mobility: Needs Assistance Bed Mobility: Sit to Supine     Supine to sit: Min guard          Transfers Overall transfer level: Needs assistance Equipment used: Rolling walker (2 wheeled) Transfers: Sit to/from Stand Sit to Stand: Min guard         General transfer comment: Able to walk around the foot of the bed to enter the bed on the oppposite side.    Balance            Standing balance support: Bilateral upper extremity supported Standing balance-Leahy Scale: Poor Standing balance comment: reliant on external support                           ADL either performed or assessed with clinical judgement   ADL Overall ADL's : Needs assistance/impaired Eating/Feeding: Independent;Sitting           Lower Body Bathing: Minimal assistance;Sit to/from stand   Upper Body Dressing : Min guard;Standing   Lower Body Dressing: Minimal assistance;Sit to/from stand               Functional mobility during ADLs: Min guard;Rolling walker       Vision Patient Visual Report: No change from baseline       Perception     Praxis      Pertinent Vitals/Pain Faces Pain Scale: Hurts little more Pain Location: RLE Pain Descriptors / Indicators: Tender;Sore;Grimacing;Guarding Pain Intervention(s): Monitored during session     Hand Dominance Right   Extremity/Trunk Assessment Upper Extremity Assessment Upper Extremity Assessment: Overall WFL for tasks assessed   Lower Extremity Assessment Lower Extremity Assessment: Defer to PT evaluation   Cervical / Trunk Assessment Cervical / Trunk Assessment: Kyphotic   Communication Communication Communication: No difficulties   Cognition Arousal/Alertness: Awake/alert Behavior During Therapy: WFL for tasks assessed/performed Overall Cognitive Status: History of cognitive impairments - at baseline  General Comments: History of dementia. Pt oriented to place and month, not year or situation. Very pleasant.  Daughter present.   General Comments   O2 99% on 3.5 L via [place]    Exercises     Shoulder Instructions      Home Living Family/patient expects to be discharged to:: Private residence Living Arrangements: Alone Available Help at Discharge: Family;Neighbor;Available PRN/intermittently Type of Home: House Home Access: Stairs to  enter [employer] of Steps: 3 Entrance Stairs-Rails: Right;Left Home Layout: One level     Bathroom Shower/Tub: Teacher, early years/pre: Standard     Home Equipment: Environmental consultant - 4 wheels;Kasandra Knudsen - single point   Additional Comments: Lives alone with his dog. Daughters live out of state. Grandson lives locally.      Prior Functioning/Environment Level of Independence: Needs assistance  Gait / Transfers Assistance Needed: Per chart: Uses walker, reports 1 fall in past month ADL's / Homemaking Assistance Needed: Per chart: Washes up at sink, requires assist for IADL's including medication management, meals provided by [employer].            OT Problem List: Decreased activity tolerance;Impaired balance (sitting and/or standing);Decreased knowledge of use of DME or AE;Decreased safety awareness;Increased edema;Pain      OT Treatment/Interventions: Self-care/ADL training;Energy conservation;DME and/or AE instruction;Balance training;Therapeutic activities    OT Goals(Current goals can be found in the care plan section) Acute Rehab OT Goals Patient Stated Goal: patient want to breath better and move around more OT Goal Formulation: With patient Time For Goal Achievement: 03/02/20 Potential to Achieve Goals: Good ADL Goals Pt Will Perform Grooming: with modified independence;sitting;standing Pt Will Perform Lower Body Bathing: with modified independence;sit to/from stand Pt Will Perform Lower Body Dressing: with modified independence;sit to/from stand Pt Will Transfer to Toilet: with modified independence;ambulating;regular height toilet Pt Will Perform Toileting - Clothing Manipulation and hygiene: with modified independence;sit to/from stand  OT Frequency: Min 2X/week   Barriers to D/C:    none noted       Co-evaluation              AM-PAC OT "6 Clicks" Daily Activity     Outcome Measure Help from another person eating meals?: None Help from  another person taking care of personal grooming?: A Little Help from another person toileting, which includes using toliet, bedpan, or urinal?: A Little Help from another person bathing (including washing, rinsing, drying)?: A Little Help from another person to put on and taking off regular upper body clothing?: A Little Help from another person to put on and taking off regular lower body clothing?: A Little 6 Click Score: 19   End of Session Equipment Utilized During Treatment: Rolling walker;Oxygen Nurse Communication: Mobility status  Activity Tolerance: Patient limited by fatigue Patient left: in bed;with call bell/phone within reach;with family/visitor present;with nursing/sitter in room  OT Visit Diagnosis: Unsteadiness on feet (R26.81);Muscle weakness (generalized) (M62.81)                Time: 1348-1410 OT Time Calculation (min): 22 min Charges:  OT General Charges $OT Visit: 1 Visit OT Evaluation $OT Eval Moderate Complexity: 1 Mod  02/17/2020  Rich, OTR/L  Acute Rehabilitation Services  Office:  [phone]   Metta Clines 02/17/2020, 2:47 PM

## 2020-02-17 NOTE — Evaluation (Signed)
Physical Therapy Evaluation Patient Details Name: Brian Brown MRN: [id number] DOB: 1932-12-24 Today's Date: 02/17/2020   History of Present Illness  87 year old with past medical history significant for CVA with residual right-sided weakness, hypertension, CAD status post non-STEMI, systolic heart failure ejection fraction 20%, paroxysmal A. fib, history of ICH presents with complaining of leg swelling and weakness over the last 3 to 4 weeks. Chest x-ray showed cardiomegaly with central pulmonary vascular congestion. Admitted with acute on chronic combined systolic heart failure exacerbation.  Clinical Impression  Prior to admission, pt lives alone, uses a walker for mobility, and requires assist for IADL's from family members and neighbor. Pt presents with generalized weakness, balance deficits, RLE pain, and decreased endurance. Ambulating x 30 feet with a walker at a min assist level. Presents as a high fall risk in setting of history of falls, decreased gait speed and safety awareness. Recommending SNF upon discharge in light of deficits and decreased caregiver support.     Follow Up Recommendations SNF    Equipment Recommendations  None recommended by PT    Recommendations for Other Services       Precautions / Restrictions Precautions Precautions: Fall Restrictions Weight Bearing Restrictions: No      Mobility  Bed Mobility Overal bed mobility: Needs Assistance Bed Mobility: Supine to Sit     Supine to sit: Min assist     General bed mobility comments: MinA for trunk to upright position    Transfers Overall transfer level: Needs assistance Equipment used: Rolling walker (2 wheeled) Transfers: Sit to/from Stand Sit to Stand: Min guard         General transfer comment: Min guard to rise to stand from elevated bed height  Ambulation/Gait Ambulation/Gait assistance: Min assist Gait Distance (Feet): 30 Feet Assistive device: Rolling walker (2 wheeled) Gait  Pattern/deviations: Step-through pattern;Decreased stride length;Trunk flexed Gait velocity: decreased Gait velocity interpretation: <1.31 ft/sec, indicative of household ambulator General Gait Details: Cues for sequencing/direction, intermittent manual assist to steer walker around obstacles  Stairs            Wheelchair Mobility    Modified Rankin (Stroke Patients Only)       Balance Overall balance assessment: Needs assistance Sitting-balance support: Feet supported Sitting balance-Leahy Scale: Good     Standing balance support: Bilateral upper extremity supported Standing balance-Leahy Scale: Poor Standing balance comment: reliant on external support                             Pertinent Vitals/Pain Pain Assessment: Faces Faces Pain Scale: Hurts whole lot Pain Location: RLE Pain Descriptors / Indicators: Tender;Sore;Grimacing;Guarding Pain Intervention(s): Limited activity within patient's tolerance;Monitored during session;Other (comment) (RN aware)    Home Living Family/patient expects to be discharged to:: Private residence Living Arrangements: Alone Available Help at Discharge: Family;Neighbor;Available PRN/intermittently Type of Home: House Home Access: Stairs to enter Entrance Stairs-Rails: Psychiatric nurse of Steps: 3 Home Layout: One level Home Equipment: Walker - 4 wheels;Kasandra Knudsen - single point Additional Comments: Lives alone with his dog. Daughters live out of state. Grandson lives locally.    Prior Function Level of Independence: Needs assistance   Gait / Transfers Assistance Needed: Uses walker, reports 1 fall in past month  ADL's / Homemaking Assistance Needed: Washes up at sink, requires assist for IADL's including medication management, meals provided by [employer]        Hand Dominance   Dominant Hand: Right  Extremity/Trunk Assessment   Upper Extremity Assessment Upper Extremity Assessment: Defer  to OT evaluation    Lower Extremity Assessment Lower Extremity Assessment: Generalized weakness       Communication   Communication: No difficulties  Cognition Arousal/Alertness: Awake/alert Behavior During Therapy: WFL for tasks assessed/performed Overall Cognitive Status: History of cognitive impairments - at baseline                                 General Comments: History of dementia. Pt oriented to place and month, not year or situation. Very pleasant      General Comments      Exercises General Exercises - Lower Extremity Ankle Circles/Pumps: Right;10 reps;Seated   Assessment/Plan    PT Assessment Patient needs continued PT services  PT Problem List Decreased strength;Decreased activity tolerance;Decreased balance;Decreased mobility;Decreased cognition;Decreased safety awareness;Pain       PT Treatment Interventions DME instruction;Gait training;Stair training;Functional mobility training;Therapeutic activities;Therapeutic exercise;Balance training;Patient/family education    PT Goals (Current goals can be found in the Care Plan section)  Acute Rehab PT Goals Patient Stated Goal: agreeable to short term SNF PT Goal Formulation: With patient Time For Goal Achievement: 03/02/20 Potential to Achieve Goals: Good    Frequency Min 3X/week   Barriers to discharge Decreased caregiver support      Co-evaluation               AM-PAC PT "6 Clicks" Mobility  Outcome Measure Help needed turning from your back to your side while in a flat bed without using bedrails?: None Help needed moving from lying on your back to sitting on the side of a flat bed without using bedrails?: A Little Help needed moving to and from a bed to a chair (including a wheelchair)?: A Little Help needed standing up from a chair using your arms (e.g., wheelchair or bedside chair)?: A Little Help needed to walk in hospital room?: A Little Help needed climbing 3-5 steps with a  railing? : A Lot 6 Click Score: 18    End of Session Equipment Utilized During Treatment: Gait belt Activity Tolerance: Patient tolerated treatment well Patient left: in chair;with call bell/phone within reach;with chair alarm set;with nursing/sitter in room Nurse Communication: Mobility status PT Visit Diagnosis: Unsteadiness on feet (R26.81);History of falling (Z91.81);Muscle weakness (generalized) (M62.81);Difficulty in walking, not elsewhere classified (R26.2);Pain Pain - Right/Left: Right Pain - part of body: Leg    Time: 6433-2951 PT Time Calculation (min) (ACUTE ONLY): 24 min   Charges:   PT Evaluation $PT Eval Moderate Complexity: 1 Mod PT Treatments $Therapeutic Activity: 8-22 mins        Wyona Almas, PT, DPT Acute Rehabilitation Services Pager [phone] Office [phone]   Deno Etienne 02/17/2020, 9:02 AM

## 2020-02-18 DIAGNOSIS — I5043 Acute on chronic combined systolic (congestive) and diastolic (congestive) heart failure: Secondary | ICD-10-CM | POA: Diagnosis not present

## 2020-02-18 LAB — BASIC METABOLIC PANEL
Anion gap: 10 (ref 5–15)
BUN: 26 mg/dL — ABNORMAL HIGH (ref 8–23)
CO2: 27 mmol/L (ref 22–32)
Calcium: 8.7 mg/dL — ABNORMAL LOW (ref 8.9–10.3)
Chloride: 102 mmol/L (ref 98–111)
Creatinine, Ser: 1.49 mg/dL — ABNORMAL HIGH (ref 0.61–1.24)
GFR, Estimated: 46 mL/min — ABNORMAL LOW (ref >60)
Glucose, Bld: 98 mg/dL (ref 70–99)
Potassium: 3.5 mmol/L (ref 3.5–5.1)
Sodium: 139 mmol/L (ref 135–145)

## 2020-02-18 NOTE — NC FL2 (Signed)
[place] LEVEL OF CARE SCREENING TOOL     IDENTIFICATION  Patient Name: Brian Brown Birthdate: [birth year] Sex: male Admission Date (Current Location): 02/15/2020  [facility] and [place] Number:  Herbalist and Address:  [facility], [place] [address] [place], [place] [address]      Provider Number: [id number]  Attending Physician Name and Address:  Elmarie Shiley, MD  Relative Name and Phone Number:       Current Level of Care: Hospital Recommended Level of Care: [place] Prior Approval Number:    Date Approved/Denied:   PASRR Number: [id number]  Discharge Plan: SNF    Current Diagnoses: Patient Active Problem List   Diagnosis Date Noted  . CHF (congestive heart failure) ([place]) 02/15/2020  . Prolonged QT interval 02/15/2020  . Dementia ([place]) 02/15/2020  . Cellulitis of right leg 02/15/2020  . Goals of care, counseling/discussion   . Palliative care by specialist   . Persistent atrial fibrillation ([place])   . Coronary artery disease   . Acute systolic heart failure ([place]) 08/31/2019  . Acute on chronic systolic CHF (congestive heart failure) ([place]) 08/31/2019  . AMS (altered mental status) 08/31/2019  . Acute respiratory failure with hypoxia ([place]) 08/31/2019  . Lactic acidosis 08/31/2019  . History of intracerebral hemorrhage without residual deficit 08/31/2019  . Protein-calorie malnutrition, severe 08/08/2019  . Intracerebral hemorrhage ([place]) 08/07/2019  . Acute CHF (congestive heart failure) ([place]) 08/06/2019  . AKI (acute kidney injury) ([place]) 08/06/2019  . Elevated troponin 08/06/2019  . Transaminitis 08/06/2019  . Closed rib fracture 08/06/2019  . History of CVA (cerebrovascular accident) 08/06/2019  . PAF (paroxysmal atrial fibrillation) ([place]) 10/20/2018  . NSTEMI (non-ST elevated myocardial infarction) ([place])   . Acute CVA (cerebrovascular accident) ([place]) 02/14/2018  . TIA (transient ischemic  attack) 02/13/2018  . Hepatitis C reactive 02/06/2011  . Hyponatremia 02/06/2011  . Postoperative anemia due to acute blood loss 02/05/2011  . Hypokalemia 02/05/2011  . Osteoarthritis of hip 01/27/2011  . Hypertension 01/27/2011  . Sleep apnea 01/27/2011  . Stroke/cerebrovascular accident ([place]) 01/27/2011  . Cancer of prostate ([place]) 01/27/2011    Orientation RESPIRATION BLADDER Height & Weight     Self,Place  O2 External catheter,Incontinent Weight: 183 lb 13.8 oz (83.4 kg) Height:  5\' 9"  (175.3 cm)  BEHAVIORAL SYMPTOMS/MOOD NEUROLOGICAL BOWEL NUTRITION STATUS      Continent Diet (please see discharge summary)  AMBULATORY STATUS COMMUNICATION OF NEEDS Skin   Supervision Verbally Other (Comment) (wound/incision, non pressure wound Tibial Distal,Posterior RT)                       Personal Care Assistance Level of Assistance  Bathing,Feeding,Dressing Bathing Assistance: Limited assistance Feeding assistance: Independent Dressing Assistance: Limited assistance     Functional Limitations Info  Sight,Hearing,Speech Sight Info: Adequate Hearing Info: Adequate Speech Info: Adequate    SPECIAL CARE FACTORS FREQUENCY  PT (By licensed PT),OT (By licensed OT)     PT Frequency: 5x per week OT Frequency: 5x per week            Contractures Contractures Info: Not present    Additional Factors Info  Code Status,Allergies Code Status Info: FULL Allergies Info: NKA           Current Medications (02/18/2020):  This is the current hospital active medication list Current Facility-Administered Medications  Medication Dose Route Frequency Provider Last Rate Last Admin  . 0.9 %  sodium  chloride infusion  250 mL Intravenous PRN Fuller Plan A, MD      . acetaminophen (TYLENOL) tablet 650 mg  650 mg Oral Q4H PRN Fuller Plan A, MD   650 mg at 02/18/20 1120  . apixaban (ELIQUIS) tablet 2.5 mg  2.5 mg Oral BID Fuller Plan A, MD   2.5 mg at 02/18/20 0929  . atorvastatin  (LIPITOR) tablet 40 mg  40 mg Oral QPM Smith, Rondell A, MD   40 mg at 02/17/20 1820  . cefTRIAXone (ROCEPHIN) 2 g in sodium chloride 0.9 % 100 mL IVPB  2 g Intravenous Q24H Smith, Rondell A, MD 200 mL/hr at 02/17/20 1820 2 g at 02/17/20 1820  . famotidine (PEPCID) tablet 20 mg  20 mg Oral QHS Smith, Rondell A, MD   20 mg at 02/17/20 2133  . furosemide (LASIX) injection 40 mg  40 mg Intravenous BID Fuller Plan A, MD   40 mg at 02/18/20 0933  . ipratropium-albuterol (DUONEB) 0.5-2.5 (3) MG/3ML nebulizer solution 3 mL  3 mL Nebulization Q6H PRN Fuller Plan A, MD   3 mL at 02/16/20 1900  . latanoprost (XALATAN) 0.005 % ophthalmic solution 1 drop  1 drop Both Eyes QHS Tamala Julian, Rondell A, MD   1 drop at 02/17/20 2134  . metoprolol succinate (TOPROL-XL) 24 hr tablet 25 mg  25 mg Oral BID WC Smith, Rondell A, MD   25 mg at 02/18/20 0931  . multivitamin with minerals tablet 1 tablet  1 tablet Oral Daily Fuller Plan A, MD   1 tablet at 02/18/20 0928  . nitroGLYCERIN (NITROSTAT) SL tablet 0.4 mg  0.4 mg Sublingual Q5 min PRN Fuller Plan A, MD      . saccharomyces boulardii (FLORASTOR) capsule 250 mg  250 mg Oral BID Fuller Plan A, MD   250 mg at 02/18/20 0928  . sodium chloride flush (NS) 0.9 % injection 3 mL  3 mL Intravenous Q12H Smith, Rondell A, MD   3 mL at 02/18/20 0939  . sodium chloride flush (NS) 0.9 % injection 3 mL  3 mL Intravenous PRN Smith, Rondell A, MD      . vitamin B-12 (CYANOCOBALAMIN) tablet 100 mcg  100 mcg Oral Daily Regalado, Belkys A, MD   100 mcg at 02/18/20 3888     Discharge Medications: Please see discharge summary for a list of discharge medications.  Relevant Imaging Results:  Relevant Lab Results:   Additional Information SSN [id number]       [place] COVID-19 Vaccine 11/16/2019 , 04/04/2019 , 03/05/2019  Vinie Sill, LCSWA

## 2020-02-18 NOTE — Progress Notes (Signed)
PROGRESS NOTE    Brian Brown  PPJ:[id number] DOB: September 25, 1932 DOA: 02/15/2020 PCP: Seward Carol, MD   Brief Narrative: 87 year old with past medical history significant for CVA with residual right-sided weakness, hypertension, CAD status post non-STEMI, systolic heart failure ejection fraction 20%, paroxysmal A. fib, history of ICH presents with complaining of leg swelling and weakness over the last 3 to 4 weeks.  Patient reports that he lives alone and give himself his medications.  Sometimes he forgets to take medication at the scheduled time and therefore misses doses.  He is caught someone live nearby and check on him.  He had gotten weaker to the point in which he was unable to get up and move around today.  Evaluation in the ED: chest x-ray showed cardiomegaly with central pulmonary vascular congestion, Doppler negative for DVT, BNP 2983, creatinine 1.6.     Assessment & Plan:   Principal Problem:   CHF (congestive heart failure) (HCC) Active Problems:   Hypokalemia   PAF (paroxysmal atrial fibrillation) (HCC)   History of CVA (cerebrovascular accident)   Coronary artery disease   Prolonged QT interval   Dementia (HCC)   Cellulitis of right leg  1-Acute on chronic combined systolic heart failure exacerbation: -Patient presented with shortness of breath, lower extremity edema, weakness.  Chest x-ray showed pulmonary vascular congestion, BNP elevated at 2983. -Patient forgets sometimes to take his medications. -Continue with IV Lasix.  -FU ECHO: EF 20 % mitral and tricuspid valve moderate regurgitation.  -Negative 2.8 L -Weight: 193---181--183 -Plan to continue with IV lasix .  -on 3 L oxygen.  -report improvement of SOB, dyspnea on exertion.   2-Cellulitis of right leg: -Doppler negative for DVT. -On IV Rocephin, continue. Day 3 -less redness, keep leg elevated.   3-AKI on CKD stage IIIb: Creatinine baseline 1.3 Presented  with a creatinine 1.6 Agree with  holding Entresto. Cr up to 1.4. Continue to monitor.   4-Paroxysmal A. Fib: -Loop recorder interrogation -Continue Eliquis.   5-CAD; Statins and beta-blocker  6-Hypokalemia:  replete   History of CVA/ ICH Continue with statins.   Dementia: Patient admits he has some issues with his memory. Patient lives alone, has got son that check on him. Patient will benefit from skilled rehab TSH 2.4, B 12 378  Tobacco abuse: Patient reported quitting several weeks ago Inguinal lymph node. Needs follow up.     Estimated body mass index is 27.15 kg/m as calculated from the following:   Height as of this encounter: 5\' 9"  (1.753 m).   Weight as of this encounter: 83.4 kg.   DVT prophylaxis: Eliquis Code Status: Full code Family Communication: Daughter who was at bedside 2/13 Disposition Plan:  Status is: Inpatient  Remains inpatient appropriate because:IV treatments appropriate due to intensity of illness or inability to take PO   Dispo: The patient is from: Home              Anticipated d/c is to: SNF              Anticipated d/c date is: 2 days              Patient currently is not medically stable to d/c.   Difficult to place patient No        Consultants:   none  Procedures:   Doppler; negative  Antimicrobials:    Subjective: He is breathing better. Not breathing at baseline. Still with LE edema  Objective: Vitals:   02/17/20 0904 02/17/20  2019 02/18/20 0308 02/18/20 0716  BP: 108/75 101/77 112/83 120/90  Pulse: 83 83 78 86  Resp: 19 16 16 20   Temp: 98.4 F (36.9 C) 98 F (36.7 C) (!) 97.5 F (36.4 C)   TempSrc: Oral  Oral   SpO2: 100% 98% 96% 100%  Weight:   83.4 kg   Height:        Intake/Output Summary (Last 24 hours) at 02/18/2020 0908 Last data filed at 02/18/2020 0300 Gross per 24 hour  Intake --  Output 1000 ml  Net -1000 ml   Filed Weights   02/15/20 1148 02/17/20 0413 02/18/20 0308  Weight: 87.6 kg 82.2 kg 83.4 kg     Examination:  General exam: Alert Respiratory system: Tachypnea, B/L crackles.  Cardiovascular system: S 1, S 2 RRRR Gastrointestinal system: BS present, soft, nt Central nervous system: Alert Extremities:  Plus 2 edema,     Data Reviewed: I have personally reviewed following labs and imaging studies  CBC: Recent Labs  Lab 02/15/20 1202 02/17/20 0415  WBC 5.3 7.8  NEUTROABS 3.6  --   HGB 13.1 12.5*  HCT 39.5 36.3*  MCV 90.8 87.9  PLT 220 160   Basic Metabolic Panel: Recent Labs  Lab 02/15/20 1202 02/16/20 0319 02/17/20 0415 02/18/20 0358  NA 144 143 140 139  K 3.3* 3.4* 3.8 3.5  CL 107 104 105 102  CO2 27 29 23 27   GLUCOSE 96 99 100* 98  BUN 25* 24* 26* 26*  CREATININE 1.64* 1.57* 1.36* 1.49*  CALCIUM 8.7* 8.9 9.0 8.7*  MG 2.0  --   --   --    GFR: Estimated Creatinine Clearance: 36.2 mL/min (A) (by C-G formula based on SCr of 1.49 mg/dL (H)). Liver Function Tests: Recent Labs  Lab 02/15/20 1202  AST 24  ALT 23  ALKPHOS 69  BILITOT 1.4*  PROT 6.5  ALBUMIN 3.0*   No results for input(s): LIPASE, AMYLASE in the last 168 hours. No results for input(s): AMMONIA in the last 168 hours. Coagulation Profile: No results for input(s): INR, PROTIME in the last 168 hours. Cardiac Enzymes: No results for input(s): CKTOTAL, CKMB, CKMBINDEX, TROPONINI in the last 168 hours. BNP (last 3 results) No results for input(s): PROBNP in the last 8760 hours. HbA1C: No results for input(s): HGBA1C in the last 72 hours. CBG: No results for input(s): GLUCAP in the last 168 hours. Lipid Profile: No results for input(s): CHOL, HDL, LDLCALC, TRIG, CHOLHDL, LDLDIRECT in the last 72 hours. Thyroid Function Tests: Recent Labs    02/16/20 0319  TSH 2.437   Anemia Panel: Recent Labs    02/16/20 0319  VITAMINB12 378   Sepsis Labs: No results for input(s): PROCALCITON, LATICACIDVEN in the last 168 hours.  Recent Results (from the past 240 hour(s))  SARS  CORONAVIRUS 2 (TAT 6-24 HRS) Nasopharyngeal Nasopharyngeal Swab     Status: None   Collection Time: 02/15/20  2:37 PM   Specimen: Nasopharyngeal Swab  Result Value Ref Range Status   SARS Coronavirus 2 NEGATIVE NEGATIVE Final    Comment: (NOTE) SARS-CoV-2 target nucleic acids are NOT DETECTED.  The SARS-CoV-2 RNA is generally detectable in upper and lower respiratory specimens during the acute phase of infection. Negative results do not preclude SARS-CoV-2 infection, do not rule out co-infections with other pathogens, and should not be used as the sole basis for treatment or other patient management decisions. Negative results must be combined with clinical observations, patient history, and epidemiological information.  The expected result is Negative.  Fact Sheet for Patients: SugarRoll.be  Fact Sheet for Healthcare Providers: https://www.woods-mathews.com/  This test is not yet approved or cleared by the [place] FDA and  has been authorized for detection and/or diagnosis of SARS-CoV-2 by FDA under an Emergency Use Authorization (EUA). This EUA will remain  in effect (meaning this test can be used) for the duration of the COVID-19 declaration under Se ction 564(b)(1) of the Act, 21 U.S.C. section 360bbb-3(b)(1), unless the authorization is terminated or revoked sooner.  Performed at [facility], [place] [address], [place], [place] [address]          Radiology Studies: ECHOCARDIOGRAM COMPLETE  Result Date: 02/16/2020    ECHOCARDIOGRAM REPORT   Patient Name:   Brian Brown Date of Exam: 02/16/2020 Medical Rec #:  [id number]        Height:       69.0 in Accession #:    [id number]       Weight:       193.2 lb Date of Birth:  01/16/1932        BSA:          2.036 m Patient Age:    31 years         BP:           128/100 mmHg Patient Gender: M                HR:           85 bpm. Exam Location:  Inpatient Procedure: 2D Echo  Indications:    stroke  History:        Patient has prior history of Echocardiogram examinations, most                 recent 09/05/2019. CAD, Arrythmias:Atrial Fibrillation; Risk                 Factors:Hypertension.  Sonographer:    Johny Chess Referring Phys: [id number] RONDELL A SMITH IMPRESSIONS  1. LV apex well-visualized without contrast, no mural thrombus identified. Left ventricular ejection fraction, by estimation, is <20%. The left ventricle has severely decreased function. The left ventricle demonstrates global hypokinesis. There is mild left ventricular hypertrophy. Left ventricular diastolic function could not be evaluated. Elevated left ventricular end-diastolic pressure.  2. Right ventricular systolic function is moderately reduced. The right ventricular size is mildly enlarged. There is moderately elevated pulmonary artery systolic pressure. The estimated right ventricular systolic pressure is 40.9 mmHg.  3. Left atrial size was moderately dilated.  4. Right atrial size was severely dilated.  5. Moderate pleural effusion in the left lateral region.  6. The mitral valve is abnormal. Moderate mitral valve regurgitation.  7. Tricuspid valve regurgitation is moderate.  8. The aortic valve is tricuspid. Aortic valve regurgitation is not visualized. Mild aortic valve sclerosis is present, with no evidence of aortic valve stenosis.  9. The pulmonic valve was abnormal. 10. Mildly dilated pulmonary artery. 11. The inferior vena cava is dilated in size with <50% respiratory variability, suggesting right atrial pressure of 15 mmHg. Comparison(s): Changes from prior study are noted. 09/05/2019: LVEF 20%, global hypokinesis, moderate RV systolic dysfunction, RVSP 43.5 mmHg. FINDINGS  Left Ventricle: LV apex well-visualized without contrast, no mural thrombus identified. Left ventricular ejection fraction, by estimation, is <20%. The left ventricle has severely decreased function. The left ventricle demonstrates  global hypokinesis. The left ventricular internal cavity size was normal in size. There is mild  left ventricular hypertrophy. Left ventricular diastolic function could not be evaluated due to atrial fibrillation. Left ventricular diastolic function could not be evaluated. Elevated left ventricular end-diastolic pressure. Right Ventricle: The right ventricular size is mildly enlarged. No increase in right ventricular wall thickness. Right ventricular systolic function is moderately reduced. There is moderately elevated pulmonary artery systolic pressure. The tricuspid regurgitant velocity is 2.90 m/s, and with an assumed right atrial pressure of 15 mmHg, the estimated right ventricular systolic pressure is 68.1 mmHg. Left Atrium: Left atrial size was moderately dilated. Right Atrium: Right atrial size was severely dilated. Pericardium: There is no evidence of pericardial effusion. Mitral Valve: The mitral valve is abnormal. There is moderate thickening of the mitral valve leaflet(s). Moderate mitral valve regurgitation. Tricuspid Valve: The tricuspid valve is grossly normal. Tricuspid valve regurgitation is moderate. Aortic Valve: The aortic valve is tricuspid. Aortic valve regurgitation is not visualized. Mild aortic valve sclerosis is present, with no evidence of aortic valve stenosis. Pulmonic Valve: The pulmonic valve was abnormal. Pulmonic valve regurgitation is mild to moderate. Aorta: The aortic root and ascending aorta are structurally normal, with no evidence of dilitation. Pulmonary Artery: The pulmonary artery is mildly dilated. Venous: The inferior vena cava is dilated in size with less than 50% respiratory variability, suggesting right atrial pressure of 15 mmHg. IAS/Shunts: No atrial level shunt detected by color flow Doppler. Additional Comments: There is a moderate pleural effusion in the left lateral region.  LEFT VENTRICLE PLAX 2D LVIDd:         4.60 cm  Diastology LVIDs:         4.20 cm  LV e'  medial:  4.81 cm/s LV PW:         1.10 cm  LV e' lateral: 4.95 cm/s LV IVS:        1.10 cm LVOT diam:     2.00 cm LVOT Area:     3.14 cm  RIGHT VENTRICLE            IVC RV S prime:     5.77 cm/s  IVC diam: 2.70 cm TAPSE (M-mode): 0.9 cm LEFT ATRIUM             Index       RIGHT ATRIUM           Index LA diam:        4.80 cm 2.36 cm/m  RA Area:     29.20 cm LA Vol (A2C):   76.9 ml 37.77 ml/m RA Volume:   109.00 ml 53.54 ml/m LA Vol (A4C):   76.9 ml 37.77 ml/m LA Biplane Vol: 79.7 ml 39.15 ml/m   AORTA Ao Root diam: 3.20 cm Ao Asc diam:  3.60 cm MR Peak grad:    65.9 mmHg   TRICUSPID VALVE MR Mean grad:    41.0 mmHg   TR Peak grad:   33.6 mmHg MR Vmax:         406.00 cm/s TR Vmax:        290.00 cm/s MR Vmean:        297.0 cm/s MR PISA:         0.57 cm    SHUNTS MR PISA Eff ROA: 6 mm       Systemic Diam: 2.00 cm MR PISA Radius:  0.30 cm Lyman Bishop MD Electronically signed by Lyman Bishop MD Signature Date/Time: 02/16/2020/11:35:43 AM    Final         Scheduled Meds: . apixaban  2.5  mg Oral BID  . atorvastatin  40 mg Oral QPM  . famotidine  20 mg Oral QHS  . furosemide  40 mg Intravenous BID  . latanoprost  1 drop Both Eyes QHS  . metoprolol succinate  25 mg Oral BID WC  . multivitamin with minerals  1 tablet Oral Daily  . saccharomyces boulardii  250 mg Oral BID  . sodium chloride flush  3 mL Intravenous Q12H  . vitamin B-12  100 mcg Oral Daily   Continuous Infusions: . sodium chloride    . cefTRIAXone (ROCEPHIN)  IV 2 g (02/17/20 1820)     LOS: 3 days    Time spent: 35 minutes.     Elmarie Shiley, MD [facility]   If 7PM-7AM, please contact night-coverage [email or web address]  02/18/2020, 9:08 AM

## 2020-02-18 NOTE — TOC Progression Note (Signed)
Transition of Care [facility]) - Progression Note    Patient Details  Name: Brian Brown MRN: [id number] Date of Birth: 24-Aug-1932  Transition of Care [facility]) CM/SW [place], [place] Phone Number: 02/18/2020, 12:13 PM  Clinical Narrative:     CSW visit patient by bedside. CSW introduced self and explained role. CSW discussed therapy recommendation of short term rehab at [facility]. Patient states he is agreeable to SNF placement. Patient was beginning to fall sleep on and off. CSW was given permission to cal his daughter,Laura.   CSW spoke with Patient's daughter,Laura by phone. CSW explained role and reason for the call. She agrees with recommendation. CSW explained the SNF process. No SNF preference at this time.   CSW will provide bed offers once available  CSW will continue to follow and assist with discharge planning.  Thurmond Butts, MSW, LCSW Clinical Social Worker   Expected Discharge Plan: [place] Barriers to Discharge: Continued Medical Work up  Expected Discharge Plan and Services Expected Discharge Plan: [place]   Discharge Planning Services: CM Consult,NA   Living arrangements for the past 2 months: Single Family Home                   DME Agency: NA       HH Arranged: NA           Social Determinants of Health (SDOH) Interventions    Readmission Risk Interventions Readmission Risk Prevention Plan 09/04/2019  Transportation Screening Complete  PCP or Specialist Appt within 3-5 Days Complete  HRI or [place] Complete  Social Work Consult for [place] Planning/Counseling Complete  Palliative Care Screening Not Applicable  Medication Review Press photographer) Complete  Some recent data might be hidden

## 2020-02-18 NOTE — Plan of Care (Signed)
  Problem: Cardiac: Goal: Ability to achieve and maintain adequate cardiopulmonary perfusion will improve Outcome: Progressing   

## 2020-02-19 DIAGNOSIS — I5043 Acute on chronic combined systolic (congestive) and diastolic (congestive) heart failure: Secondary | ICD-10-CM | POA: Diagnosis not present

## 2020-02-19 LAB — BASIC METABOLIC PANEL
Anion gap: 9 (ref 5–15)
BUN: 28 mg/dL — ABNORMAL HIGH (ref 8–23)
CO2: 27 mmol/L (ref 22–32)
Calcium: 8.6 mg/dL — ABNORMAL LOW (ref 8.9–10.3)
Chloride: 103 mmol/L (ref 98–111)
Creatinine, Ser: 1.59 mg/dL — ABNORMAL HIGH (ref 0.61–1.24)
GFR, Estimated: 42 mL/min — ABNORMAL LOW (ref >60)
Glucose, Bld: 92 mg/dL (ref 70–99)
Potassium: 3.4 mmol/L — ABNORMAL LOW (ref 3.5–5.1)
Sodium: 139 mmol/L (ref 135–145)

## 2020-02-19 LAB — BRAIN NATRIURETIC PEPTIDE: B Natriuretic Peptide: 1925.5 pg/mL — ABNORMAL HIGH (ref 0.0–100.0)

## 2020-02-19 LAB — SARS CORONAVIRUS 2 (TAT 6-24 HRS): SARS Coronavirus 2: NEGATIVE

## 2020-02-19 MED ORDER — POTASSIUM CHLORIDE CRYS ER 20 MEQ PO TBCR
20.0000 meq | EXTENDED_RELEASE_TABLET | Freq: Once | ORAL | Status: AC
  Administered 2020-02-19: 20 meq via ORAL
  Filled 2020-02-19: qty 1

## 2020-02-19 MED ORDER — FUROSEMIDE 40 MG PO TABS
40.0000 mg | ORAL_TABLET | Freq: Two times a day (BID) | ORAL | Status: DC
Start: 2020-02-19 — End: 2020-02-20
  Administered 2020-02-19 – 2020-02-20 (×3): 40 mg via ORAL
  Filled 2020-02-19 (×3): qty 1

## 2020-02-19 NOTE — Progress Notes (Signed)
Heart Failure Stewardship Pharmacist Progress Note   PCP: Seward Carol, MD PCP-Cardiologist: Larae Grooms, MD    HPI:  87 yo M with PMH of CVA, HTN, CAD s/p NSTEMI, HFrEF, afib, and dementia. He presented to the ED on 02/15/20 with edema and weakness over the last 3-4 weeks. He noted some issues remembering to take his medications on time. An ECHO was done on 02/16/20 and LVEF was severely reduced to <20% (was 20% in August 2021 and 45-50% in December 2020).  Current HF Medications: Furosemide 40 mg PO BID Metoprolol XL 25 mg BID  Prior to admission HF Medications: Furosemide 40 mg BID Metoprolol XL 25 mg BID Entresto 24/26 mg BID  Pertinent Lab Values: . Serum creatinine 1.59, BUN 28, Potassium 3.4, Sodium 139, BNP 1925.5  Vital Signs: . Weight: 181 lbs (admission weight: 193 lbs) . Blood pressure: 110/90s  . Heart rate: 80s   Medication Assistance / Insurance Benefits Check: Does the patient have prescription insurance?  Yes Type of insurance plan: [employer] Medicare  Does the patient qualify for medication assistance through manufacturers or grants?   Pending . Eligible grants and/or patient assistance programs: pending . Medication assistance applications in progress: none  . Medication assistance applications approved: none Approved medication assistance renewals will be completed by: [place]:  Prior to admission outpatient pharmacy: [facility] Is the patient willing to use [place] at discharge? Yes Is the patient willing to transition their outpatient pharmacy to utilize a [facility] outpatient pharmacy?   Pending    Assessment: 1. Acute on chronic systolic CHF (EF <71%), likely due to ICM. NYHA class III symptoms. - IV lasix transitioned to PO today - Continue metoprolol XL 25 mg BID - Entresto was held with elevated SCr on admission. Baseline SCr ~1.3-1.4. SCr up to 1.59 today. May be able to resume tomorrow after switching off IV  lasix - Consider starting spironolactone and SGLT2i prior to discharge   Plan: 1) Medication changes recommended at this time: - Continue current regimen; start Entresto in AM if SCr improved  2) Patient assistance application(s): - None pending  3)  Education  - To be completed prior to discharge  Kerby Nora, PharmD, BCPS Heart Failure Stewardship Pharmacist Phone [phone]

## 2020-02-19 NOTE — TOC Progression Note (Addendum)
Transition of Care [facility]) - Progression Note    Patient Details  Name: DARROL BRANDENBURG MRN: [id number] Date of Birth: Oct 18, 1932  Transition of Care [facility]) CM/SW [place], [place] Phone Number: 02/19/2020, 1:41 PM  Clinical Narrative:     CSW met with pt to discuss SNF choices. Pt has questions about disposition and reason he needs SNF. CSW explained PT recommendation and SNF services. Pt is agreeable at this time but has difficulty choosing a facility. Pt requests that CSW get a decision from his daughters in [place].   CSW calls daughter [name]. She explains that her and her sisters are actually in [place]. CSW provides bed offers verbally and also emails bed offers to [email or web address]. Daughter to review with her sisters and provid CSW with decision.   1335: Daughter [name] calls CSW and confirms choice for [place]. CSW will coordinate with [employer] for transition tomorrow. Covid requested and CSW to start auth.   1545: CSW called [employer] for auth. They are doing a benefits check. Pt's [employer] likely managed by [facility] directly so SNF would start auth. CSW notified SNF. They confirmed they will start auth.    Expected Discharge Plan: [place] Barriers to Discharge: Continued Medical Work up  Expected Discharge Plan and Services Expected Discharge Plan: [place]   Discharge Planning Services: CM Consult,NA   Living arrangements for the past 2 months: Single Family Home                   DME Agency: NA       HH Arranged: NA           Social Determinants of Health (SDOH) Interventions    Readmission Risk Interventions Readmission Risk Prevention Plan 09/04/2019  Transportation Screening Complete  PCP or Specialist Appt within 3-5 Days Complete  HRI or [place] Complete  Social Work Consult for [place] Planning/Counseling Complete  Palliative Care Screening Not Applicable  Medication Review  Press photographer) Complete  Some recent data might be hidden

## 2020-02-19 NOTE — Progress Notes (Signed)
PROGRESS NOTE    Brian Brown  DZH:[id number] DOB: 1932-08-04 DOA: 02/15/2020 PCP: Seward Carol, MD   Brief Narrative: 87 year old with past medical history significant for CVA with residual right-sided weakness, hypertension, CAD status post non-STEMI, systolic heart failure ejection fraction 20%, paroxysmal A. fib, history of ICH presents with complaining of leg swelling and weakness over the last 3 to 4 weeks.  Patient reports that he lives alone and give himself his medications.  Sometimes he forgets to take medication at the scheduled time and therefore misses doses.  He is caught someone live nearby and check on him.  He had gotten weaker to the point in which he was unable to get up and move around today.  Evaluation in the ED: chest x-ray showed cardiomegaly with central pulmonary vascular congestion, Doppler negative for DVT, BNP 2983, creatinine 1.6.     Assessment & Plan:   Principal Problem:   CHF (congestive heart failure) (HCC) Active Problems:   Hypokalemia   PAF (paroxysmal atrial fibrillation) (HCC)   History of CVA (cerebrovascular accident)   Coronary artery disease   Prolonged QT interval   Dementia (HCC)   Cellulitis of right leg  1-Acute on chronic combined systolic heart failure exacerbation: -Patient presented with shortness of breath, lower extremity edema, weakness.  Chest x-ray showed pulmonary vascular congestion, BNP elevated at 2983. -Patient forgets sometimes to take his medications. -Treated with IV lasix 40 mg IV BID. Transition to oral lasix today.  -FU ECHO: EF 20 % mitral and tricuspid valve moderate regurgitation.  -Negative 2.4 L -Weight: 193---181--183--181. -Plan to continue with IV lasix .  -Continue to be o 3 L. Cr up to 1.5, plan to transition to oral lasix.  -BNP down to 1900 from 2900 on admission.   2-Cellulitis of right leg: -Doppler negative for DVT. -On IV Rocephin, continue. Day 5/5 , last dose today. Antibiotics started  2/10. -less redness, keep leg elevated.   3-AKI on CKD stage IIIb: Creatinine baseline 1.3 Presented  with a creatinine 1.6 Agree with holding Entresto. Cr up to 1.5. lasix change to oral.   4-Paroxysmal A. Fib: -Loop recorder interrogation -Continue Eliquis.   5-CAD; Statins and beta-blocker  6-Hypokalemia: Replete orally.   History of CVA/ ICH Continue with statins.   Dementia: Patient admits he has some issues with his memory. Patient lives alone. Patient will benefit from skilled rehab TSH 2.4, B 12 378  Tobacco abuse: Patient reported quitting several weeks ago Inguinal lymph node. Needs follow up.     Estimated body mass index is 26.86 kg/m as calculated from the following:   Height as of this encounter: 5\' 9"  (1.753 m).   Weight as of this encounter: 82.5 kg.   DVT prophylaxis: Eliquis Code Status: Full code Family Communication: will call Daughter later.  Disposition Plan:  Status is: Inpatient  Remains inpatient appropriate because:IV treatments appropriate due to intensity of illness or inability to take PO   Dispo: The patient is from: Home              Anticipated d/c is to: SNF              Anticipated d/c date is: 2 days              Patient currently is not medically stable to d/c.   Difficult to place patient No        Consultants:   none  Procedures:   Doppler; negative  Antimicrobials:  Subjective: He is sitting in recliner. He has ace wrap LE. Less edema. He appears breathing better.  He is feeling better.   Objective: Vitals:   02/19/20 0301 02/19/20 0400 02/19/20 0744 02/19/20 1327  BP:  111/88 (!) 117/91 106/80  Pulse:  89 64 82  Resp:  15  18  Temp:  97.7 F (36.5 C) 97.7 F (36.5 C) (!) 97.3 F (36.3 C)  TempSrc:  Oral Oral Oral  SpO2:  100% 100% 100%  Weight: 82.5 kg     Height:        Intake/Output Summary (Last 24 hours) at 02/19/2020 1443 Last data filed at 02/19/2020 0000 Gross per 24 hour   Intake 400 ml  Output 400 ml  Net 0 ml   Filed Weights   02/17/20 0413 02/18/20 0308 02/19/20 0301  Weight: 82.2 kg 83.4 kg 82.5 kg    Examination:  General exam: Alert, NAD Respiratory system: CTA Cardiovascular system: S 1, S 2 RRR Gastrointestinal system: BS present, soft, nt Central nervous system: Alert Extremities:  Trace edema    Data Reviewed: I have personally reviewed following labs and imaging studies  CBC: Recent Labs  Lab 02/15/20 1202 02/17/20 0415  WBC 5.3 7.8  NEUTROABS 3.6  --   HGB 13.1 12.5*  HCT 39.5 36.3*  MCV 90.8 87.9  PLT 220 469   Basic Metabolic Panel: Recent Labs  Lab 02/15/20 1202 02/16/20 0319 02/17/20 0415 02/18/20 0358 02/19/20 0229  NA 144 143 140 139 139  K 3.3* 3.4* 3.8 3.5 3.4*  CL 107 104 105 102 103  CO2 27 29 23 27 27   GLUCOSE 96 99 100* 98 92  BUN 25* 24* 26* 26* 28*  CREATININE 1.64* 1.57* 1.36* 1.49* 1.59*  CALCIUM 8.7* 8.9 9.0 8.7* 8.6*  MG 2.0  --   --   --   --    GFR: Estimated Creatinine Clearance: 34 mL/min (A) (by C-G formula based on SCr of 1.59 mg/dL (H)). Liver Function Tests: Recent Labs  Lab 02/15/20 1202  AST 24  ALT 23  ALKPHOS 69  BILITOT 1.4*  PROT 6.5  ALBUMIN 3.0*   No results for input(s): LIPASE, AMYLASE in the last 168 hours. No results for input(s): AMMONIA in the last 168 hours. Coagulation Profile: No results for input(s): INR, PROTIME in the last 168 hours. Cardiac Enzymes: No results for input(s): CKTOTAL, CKMB, CKMBINDEX, TROPONINI in the last 168 hours. BNP (last 3 results) No results for input(s): PROBNP in the last 8760 hours. HbA1C: No results for input(s): HGBA1C in the last 72 hours. CBG: No results for input(s): GLUCAP in the last 168 hours. Lipid Profile: No results for input(s): CHOL, HDL, LDLCALC, TRIG, CHOLHDL, LDLDIRECT in the last 72 hours. Thyroid Function Tests: No results for input(s): TSH, T4TOTAL, FREET4, T3FREE, THYROIDAB in the last 72  hours. Anemia Panel: No results for input(s): VITAMINB12, FOLATE, FERRITIN, TIBC, IRON, RETICCTPCT in the last 72 hours. Sepsis Labs: No results for input(s): PROCALCITON, LATICACIDVEN in the last 168 hours.  Recent Results (from the past 240 hour(s))  SARS CORONAVIRUS 2 (TAT 6-24 HRS) Nasopharyngeal Nasopharyngeal Swab     Status: None   Collection Time: 02/15/20  2:37 PM   Specimen: Nasopharyngeal Swab  Result Value Ref Range Status   SARS Coronavirus 2 NEGATIVE NEGATIVE Final    Comment: (NOTE) SARS-CoV-2 target nucleic acids are NOT DETECTED.  The SARS-CoV-2 RNA is generally detectable in upper and lower respiratory specimens during the acute  phase of infection. Negative results do not preclude SARS-CoV-2 infection, do not rule out co-infections with other pathogens, and should not be used as the sole basis for treatment or other patient management decisions. Negative results must be combined with clinical observations, patient history, and epidemiological information. The expected result is Negative.  Fact Sheet for Patients: SugarRoll.be  Fact Sheet for Healthcare Providers: https://www.woods-mathews.com/  This test is not yet approved or cleared by the [place] FDA and  has been authorized for detection and/or diagnosis of SARS-CoV-2 by FDA under an Emergency Use Authorization (EUA). This EUA will remain  in effect (meaning this test can be used) for the duration of the COVID-19 declaration under Se ction 564(b)(1) of the Act, 21 U.S.C. section 360bbb-3(b)(1), unless the authorization is terminated or revoked sooner.  Performed at [facility], [place] [address] [place], [place] [address]          Radiology Studies: No results found.      Scheduled Meds: . apixaban  2.5 mg Oral BID  . atorvastatin  40 mg Oral QPM  . famotidine  20 mg Oral QHS  . furosemide  40 mg Oral BID  . latanoprost  1 drop Both  Eyes QHS  . metoprolol succinate  25 mg Oral BID WC  . multivitamin with minerals  1 tablet Oral Daily  . saccharomyces boulardii  250 mg Oral BID  . sodium chloride flush  3 mL Intravenous Q12H  . vitamin B-12  100 mcg Oral Daily   Continuous Infusions: . sodium chloride    . cefTRIAXone (ROCEPHIN)  IV 2 g (02/18/20 1754)     LOS: 4 days    Time spent: 35 minutes.     Elmarie Shiley, MD [facility]   If 7PM-7AM, please contact night-coverage [email or web address]  02/19/2020, 2:43 PM

## 2020-02-19 NOTE — Progress Notes (Signed)
Physical Therapy Treatment Patient Details Name: Brian Brown MRN: [id number] DOB: [birth year] Today's Date: 02/19/2020    History of Present Illness 87 year old with past medical history significant for CVA with residual right-sided weakness, hypertension, CAD status post non-STEMI, systolic heart failure ejection fraction 20%, paroxysmal A. fib, history of ICH presents with complaining of leg swelling and weakness over the last 3 to 4 weeks. Chest x-ray showed cardiomegaly with central pulmonary vascular congestion. Admitted with acute on chronic combined systolic heart failure exacerbation.    PT Comments    Patient received up in recliner, very pleasant. Agreeable to PT session. Patient requires min guard for sit to stand from recliner. Ambulated 125 feet on room air with RW. Difficult to get O2 sat reading due to poor pleth. Patient reports no sob, but is fatigued after this distance. Patient will continue to benefit from skilled PT while here to improve strength and functional independence.  If patient had 24 hour supervision at home he may be able to return home with HHPT. If not, a short stay at SNF would be appropriate.       Follow Up Recommendations  SNF;Home health PT;Supervision/Assistance - 24 hour     Equipment Recommendations  None recommended by PT    Recommendations for Other Services       Precautions / Restrictions Precautions Precautions: Fall Restrictions Weight Bearing Restrictions: No    Mobility  Bed Mobility               General bed mobility comments: patient received up in recliner. remained in recliner    Transfers Overall transfer level: Needs assistance Equipment used: Rolling walker (2 wheeled) Transfers: Sit to/from Stand Sit to Stand: Min guard            Ambulation/Gait Ambulation/Gait assistance: Min guard Gait Distance (Feet): 125 Feet Assistive device: Rolling walker (2 wheeled) Gait Pattern/deviations: Step-through  pattern;Trunk flexed;Decreased stride length Gait velocity: decreased   General Gait Details: Cues to avoid obstacles, otherwise steady with RW. Difficult to get O2 saturation reading, poor pleth. Ambulated on room air, reports no SOB.   Stairs             Wheelchair Mobility    Modified Rankin (Stroke Patients Only)       Balance Overall balance assessment: Needs assistance Sitting-balance support: Feet supported Sitting balance-Leahy Scale: Good     Standing balance support: Bilateral upper extremity supported;During functional activity Standing balance-Leahy Scale: Good Standing balance comment: reliant on external support                            Cognition Arousal/Alertness: Awake/alert Behavior During Therapy: WFL for tasks assessed/performed Overall Cognitive Status: History of cognitive impairments - at baseline                                 General Comments: History of dementia. Pt oriented to place and month, not year or situation. Very pleasant.      Exercises      General Comments        Pertinent Vitals/Pain      Home Living                      Prior Function            PT Goals (current goals can now be found in the care  plan section) Acute Rehab PT Goals Patient Stated Goal: patient want to breath better and move around more PT Goal Formulation: With patient Time For Goal Achievement: 03/02/20 Potential to Achieve Goals: Good Progress towards PT goals: Progressing toward goals    Frequency    Min 3X/week      PT Plan Current plan remains appropriate    Co-evaluation              AM-PAC PT "6 Clicks" Mobility   Outcome Measure  Help needed turning from your back to your side while in a flat bed without using bedrails?: None Help needed moving from lying on your back to sitting on the side of a flat bed without using bedrails?: A Little Help needed moving to and from a bed to a  chair (including a wheelchair)?: A Little Help needed standing up from a chair using your arms (e.g., wheelchair or bedside chair)?: A Little Help needed to walk in hospital room?: A Little Help needed climbing 3-5 steps with a railing? : A Little 6 Click Score: 19    End of Session Equipment Utilized During Treatment: Gait belt;Oxygen Activity Tolerance: Patient tolerated treatment well Patient left: in chair;with call bell/phone within reach Nurse Communication: Mobility status PT Visit Diagnosis: Muscle weakness (generalized) (M62.81)     Time: 9833-8250 PT Time Calculation (min) (ACUTE ONLY): 14 min  Charges:  $Gait Training: 8-22 mins                     Antonetta Clanton, PT, GCS 02/19/20,10:10 AM

## 2020-02-20 DIAGNOSIS — R0602 Shortness of breath: Secondary | ICD-10-CM | POA: Diagnosis not present

## 2020-02-20 DIAGNOSIS — M79661 Pain in right lower leg: Secondary | ICD-10-CM | POA: Diagnosis not present

## 2020-02-20 DIAGNOSIS — E785 Hyperlipidemia, unspecified: Secondary | ICD-10-CM | POA: Diagnosis present

## 2020-02-20 DIAGNOSIS — R609 Edema, unspecified: Secondary | ICD-10-CM | POA: Diagnosis not present

## 2020-02-20 DIAGNOSIS — Z789 Other specified health status: Secondary | ICD-10-CM | POA: Diagnosis not present

## 2020-02-20 DIAGNOSIS — M2550 Pain in unspecified joint: Secondary | ICD-10-CM | POA: Diagnosis not present

## 2020-02-20 DIAGNOSIS — I63432 Cerebral infarction due to embolism of left posterior cerebral artery: Secondary | ICD-10-CM | POA: Diagnosis not present

## 2020-02-20 DIAGNOSIS — M6281 Muscle weakness (generalized): Secondary | ICD-10-CM | POA: Diagnosis not present

## 2020-02-20 DIAGNOSIS — R0902 Hypoxemia: Secondary | ICD-10-CM | POA: Diagnosis not present

## 2020-02-20 DIAGNOSIS — I252 Old myocardial infarction: Secondary | ICD-10-CM | POA: Diagnosis not present

## 2020-02-20 DIAGNOSIS — I110 Hypertensive heart disease with heart failure: Secondary | ICD-10-CM | POA: Diagnosis not present

## 2020-02-20 DIAGNOSIS — Z7401 Bed confinement status: Secondary | ICD-10-CM | POA: Diagnosis not present

## 2020-02-20 DIAGNOSIS — N1832 Chronic kidney disease, stage 3b: Secondary | ICD-10-CM | POA: Diagnosis not present

## 2020-02-20 DIAGNOSIS — I493 Ventricular premature depolarization: Secondary | ICD-10-CM | POA: Diagnosis not present

## 2020-02-20 DIAGNOSIS — I255 Ischemic cardiomyopathy: Secondary | ICD-10-CM | POA: Diagnosis present

## 2020-02-20 DIAGNOSIS — J811 Chronic pulmonary edema: Secondary | ICD-10-CM | POA: Diagnosis not present

## 2020-02-20 DIAGNOSIS — Z96642 Presence of left artificial hip joint: Secondary | ICD-10-CM | POA: Diagnosis present

## 2020-02-20 DIAGNOSIS — E876 Hypokalemia: Secondary | ICD-10-CM | POA: Diagnosis not present

## 2020-02-20 DIAGNOSIS — I77810 Thoracic aortic ectasia: Secondary | ICD-10-CM | POA: Diagnosis present

## 2020-02-20 DIAGNOSIS — F0390 Unspecified dementia without behavioral disturbance: Secondary | ICD-10-CM | POA: Diagnosis not present

## 2020-02-20 DIAGNOSIS — I5043 Acute on chronic combined systolic (congestive) and diastolic (congestive) heart failure: Secondary | ICD-10-CM | POA: Diagnosis not present

## 2020-02-20 DIAGNOSIS — Z20822 Contact with and (suspected) exposure to covid-19: Secondary | ICD-10-CM | POA: Diagnosis not present

## 2020-02-20 DIAGNOSIS — I214 Non-ST elevation (NSTEMI) myocardial infarction: Secondary | ICD-10-CM | POA: Diagnosis not present

## 2020-02-20 DIAGNOSIS — I10 Essential (primary) hypertension: Secondary | ICD-10-CM | POA: Diagnosis not present

## 2020-02-20 DIAGNOSIS — I610 Nontraumatic intracerebral hemorrhage in hemisphere, subcortical: Secondary | ICD-10-CM | POA: Diagnosis not present

## 2020-02-20 DIAGNOSIS — J90 Pleural effusion, not elsewhere classified: Secondary | ICD-10-CM | POA: Diagnosis not present

## 2020-02-20 DIAGNOSIS — I5023 Acute on chronic systolic (congestive) heart failure: Secondary | ICD-10-CM | POA: Diagnosis not present

## 2020-02-20 DIAGNOSIS — R0689 Other abnormalities of breathing: Secondary | ICD-10-CM | POA: Diagnosis not present

## 2020-02-20 DIAGNOSIS — Z7901 Long term (current) use of anticoagulants: Secondary | ICD-10-CM | POA: Diagnosis not present

## 2020-02-20 DIAGNOSIS — I272 Pulmonary hypertension, unspecified: Secondary | ICD-10-CM | POA: Diagnosis present

## 2020-02-20 DIAGNOSIS — I130 Hypertensive heart and chronic kidney disease with heart failure and stage 1 through stage 4 chronic kidney disease, or unspecified chronic kidney disease: Secondary | ICD-10-CM | POA: Diagnosis not present

## 2020-02-20 DIAGNOSIS — M79671 Pain in right foot: Secondary | ICD-10-CM | POA: Diagnosis not present

## 2020-02-20 DIAGNOSIS — R404 Transient alteration of awareness: Secondary | ICD-10-CM | POA: Diagnosis not present

## 2020-02-20 DIAGNOSIS — R9431 Abnormal electrocardiogram [ECG] [EKG]: Secondary | ICD-10-CM | POA: Diagnosis not present

## 2020-02-20 DIAGNOSIS — Z9119 Patient's noncompliance with other medical treatment and regimen: Secondary | ICD-10-CM | POA: Diagnosis not present

## 2020-02-20 DIAGNOSIS — R531 Weakness: Secondary | ICD-10-CM | POA: Diagnosis not present

## 2020-02-20 DIAGNOSIS — I2510 Atherosclerotic heart disease of native coronary artery without angina pectoris: Secondary | ICD-10-CM | POA: Diagnosis not present

## 2020-02-20 DIAGNOSIS — I480 Paroxysmal atrial fibrillation: Secondary | ICD-10-CM | POA: Diagnosis not present

## 2020-02-20 DIAGNOSIS — I517 Cardiomegaly: Secondary | ICD-10-CM | POA: Diagnosis not present

## 2020-02-20 DIAGNOSIS — G4730 Sleep apnea, unspecified: Secondary | ICD-10-CM | POA: Diagnosis present

## 2020-02-20 DIAGNOSIS — L03115 Cellulitis of right lower limb: Secondary | ICD-10-CM | POA: Diagnosis not present

## 2020-02-20 DIAGNOSIS — I69251 Hemiplegia and hemiparesis following other nontraumatic intracranial hemorrhage affecting right dominant side: Secondary | ICD-10-CM | POA: Diagnosis not present

## 2020-02-20 DIAGNOSIS — R5381 Other malaise: Secondary | ICD-10-CM | POA: Diagnosis not present

## 2020-02-20 DIAGNOSIS — Z8546 Personal history of malignant neoplasm of prostate: Secondary | ICD-10-CM | POA: Diagnosis not present

## 2020-02-20 DIAGNOSIS — Z66 Do not resuscitate: Secondary | ICD-10-CM | POA: Diagnosis not present

## 2020-02-20 DIAGNOSIS — Z7189 Other specified counseling: Secondary | ICD-10-CM | POA: Diagnosis not present

## 2020-02-20 DIAGNOSIS — N179 Acute kidney failure, unspecified: Secondary | ICD-10-CM | POA: Diagnosis not present

## 2020-02-20 DIAGNOSIS — J9621 Acute and chronic respiratory failure with hypoxia: Secondary | ICD-10-CM | POA: Diagnosis not present

## 2020-02-20 DIAGNOSIS — I509 Heart failure, unspecified: Secondary | ICD-10-CM | POA: Diagnosis not present

## 2020-02-20 DIAGNOSIS — R600 Localized edema: Secondary | ICD-10-CM | POA: Diagnosis not present

## 2020-02-20 DIAGNOSIS — Z515 Encounter for palliative care: Secondary | ICD-10-CM | POA: Diagnosis not present

## 2020-02-20 LAB — BASIC METABOLIC PANEL
Anion gap: 10 (ref 5–15)
BUN: 30 mg/dL — ABNORMAL HIGH (ref 8–23)
CO2: 29 mmol/L (ref 22–32)
Calcium: 9.1 mg/dL (ref 8.9–10.3)
Chloride: 102 mmol/L (ref 98–111)
Creatinine, Ser: 1.46 mg/dL — ABNORMAL HIGH (ref 0.61–1.24)
GFR, Estimated: 47 mL/min — ABNORMAL LOW (ref >60)
Glucose, Bld: 95 mg/dL (ref 70–99)
Potassium: 3.6 mmol/L (ref 3.5–5.1)
Sodium: 141 mmol/L (ref 135–145)

## 2020-02-20 MED ORDER — APIXABAN 2.5 MG PO TABS: 2.5000 mg | ORAL_TABLET | Freq: Two times a day (BID) | ORAL | 2 refills | Status: AC

## 2020-02-20 MED ORDER — POTASSIUM CHLORIDE ER 10 MEQ PO TBCR
20.0000 meq | EXTENDED_RELEASE_TABLET | Freq: Every day | ORAL | 0 refills | Status: DC
Start: 2020-02-20 — End: 2020-03-02

## 2020-02-20 MED ORDER — CYANOCOBALAMIN 100 MCG PO TABS: 100.0000 ug | ORAL_TABLET | Freq: Every day | ORAL | 0 refills | Status: AC

## 2020-02-20 NOTE — Plan of Care (Signed)

## 2020-02-20 NOTE — TOC Transition Note (Signed)
Transition of Care [facility]) - CM/SW Discharge Note   Patient Details  Name: Brian Brown MRN: [id number] Date of Birth: [birth year]  [facility] CM/SW Contact:  Bethann Berkshire, [place] Phone Number: 02/20/2020, 11:14 AM   Clinical Narrative:    Patient will DC to: [facility] Anticipated DC date: 02/20/20 Family notified: Taytum, Wheller (Daughter)  [phone] (Mobile) Transport by: Corey Harold   Per MD patient ready for DC to . RN, patient, patient's family, and facility notified of DC. Discharge Summary and FL2 sent to facility. RN to call report prior to discharge [phone]). DC packet on chart. Ambulance transport requested for patient.   CSW will sign off for now as social work intervention is no longer needed. Please consult [place] again if new needs arise.    Final next level of care: Skilled Nursing Facility Barriers to Discharge: No Barriers Identified   Patient Goals and CMS Choice Patient states their goals for this hospitalization and ongoing recovery are:: get better CMS Medicare.gov Compare Post Acute Care list provided to:: Patient Choice offered to / list presented to : Patient  Discharge Placement              Patient chooses bed at: [facility] Patient to be transferred to facility by: ptar Name of family member notified: Abir, Craine (Daughter)   [phone] (Mobile) Patient and family notified of of transfer: 02/20/20  Discharge Plan and Services   Discharge Planning Services: CM Consult,NA              DME Agency: NA       HH Arranged: NA          Social Determinants of Health (SDOH) Interventions     Readmission Risk Interventions Readmission Risk Prevention Plan 09/04/2019  Transportation Screening Complete  PCP or Specialist Appt within 3-5 Days Complete  HRI or Home Care Consult Complete  Social Work Consult for [place] Planning/Counseling Complete  Palliative Care Screening Not Applicable   Medication Review Press photographer) Complete  Some recent data might be hidden

## 2020-02-20 NOTE — Progress Notes (Signed)
Report called and given to [place] at [facility]

## 2020-02-20 NOTE — Care Management Important Message (Signed)
Important Message  Patient Details  Name: Brian Brown MRN: [id number] Date of Birth: [birth year]   Medicare Important Message Given:  Yes     Shelda Altes 02/20/2020, 11:13 AM

## 2020-02-20 NOTE — Progress Notes (Addendum)
Heart Failure Stewardship Pharmacist Progress Note   PCP: Seward Carol, MD PCP-Cardiologist: Larae Grooms, MD    HPI:  87 yo M with PMH of CVA, HTN, CAD s/p NSTEMI, HFrEF, afib, and dementia. He presented to the ED on 02/15/20 with edema and weakness over the last 3-4 weeks. He noted some issues remembering to take his medications on time. An ECHO was done on 02/16/20 and LVEF was severely reduced to <20% (was 20% in August 2021 and 45-50% in December 2020).  Current HF Medications: Furosemide 40 mg PO BID Metoprolol XL 25 mg BID  Prior to admission HF Medications: Furosemide 40 mg BID Metoprolol XL 25 mg BID Entresto 24/26 mg BID  Pertinent Lab Values: . Serum creatinine 1.46, BUN 30, Potassium 3.6, Sodium 141, BNP 1925.5  Vital Signs: . Weight: 182 lbs (admission weight: 193 lbs) . Blood pressure: 110/90s  . Heart rate: 70-80s   Medication Assistance / Insurance Benefits Check: Does the patient have prescription insurance?  Yes Type of insurance plan: [employer] Medicare  Does the patient qualify for medication assistance through manufacturers or grants?   Pending . Eligible grants and/or patient assistance programs: pending . Medication assistance applications in progress: none  . Medication assistance applications approved: none Approved medication assistance renewals will be completed by: [place]:  Prior to admission outpatient pharmacy: [facility] Is the patient willing to use [facility] at discharge? Yes Is the patient willing to transition their outpatient pharmacy to utilize a [facility] outpatient pharmacy?   Pending    Assessment: 1. Acute on chronic systolic CHF (EF <74%), likely due to ICM. NYHA class III symptoms. - Continue furosemide 40 mg PO BID - Continue metoprolol XL 25 mg BID - Entresto was held with elevated SCr on admission. Baseline SCr ~1.3-1.4. SCr down to 1.46 today. Consider restarting today. - Consider starting  spironolactone and SGLT2i prior to discharge   Plan: 1) Medication changes recommended at this time: - Restart Entresto 24/26 mg BID  2) Patient assistance application(s): - None pending  3)  Education  - Patient has been educated on current HF medications and potential additions to HF medication regimen  - Patient verbalizes understanding that over the next few months, these medication doses may change and more medications may be added to optimize HF regimen - Patient has been educated on basic disease state pathophysiology and goals of therapy - Time spent 15 mins  Kerby Nora, PharmD, BCPS Heart Failure Stewardship Pharmacist Phone [phone]

## 2020-02-20 NOTE — Discharge Summary (Addendum)
Physician Discharge Summary  Brian Brown UXL:[id number] DOB: 1932-09-12 DOA: 02/15/2020  PCP: Seward Carol, MD  Admit date: 02/15/2020 Discharge date: 02/20/2020  Admitted From: Home  Disposition: SNF  Recommendations for Outpatient Follow-up:  1. Follow up with PCP in 1-2 weeks 2. Please obtain BMP/CBC in one week 3. Needs follow up with cardiologist for further discussion of goals of care for his Heart failure. Consider resuming Entresto if BP and renal function allows it.    Discharge Condition: Stable.  CODE STATUS: Full code Diet recommendation: Heart Healthy  Brief/Interim Summary: 87 year old with past medical history significant for CVA with residual right-sided weakness, hypertension, CAD status post non-STEMI, systolic heart failure ejection fraction 20%, paroxysmal A. fib, history of ICH presents with complaining of leg swelling and weakness over the last 3 to 4 weeks.  Patient reports that he lives alone and give himself his medications.  Sometimes he forgets to take medication at the scheduled time and therefore misses doses.  He is caught someone live nearby and check on him.  He had gotten weaker to the point in which he was unable to get up and move around today.  Evaluation in the ED: chest x-ray showed cardiomegaly with central pulmonary vascular congestion, Doppler negative for DVT, BNP 2983, creatinine 1.6.   1-Acute on chronic combined systolic heart failure exacerbation: -Patient presented with shortness of breath, lower extremity edema, weakness.  Chest x-ray showed pulmonary vascular congestion, BNP elevated at 2983. -Patient forgets sometimes to take his medications. -Treated with IV lasix 40 mg IV BID. Transition to oral lasix 2/14 -FU ECHO: EF 20 % mitral and tricuspid valve moderate regurgitation.  -Negative 2 L.  -Weight: 193---181--183--181.182 -Plan to continue with IV lasix .  -Continue to be o 3 L,  -BNP down to 1900 from 2900 on admission.   -Plan to discharge on lasix 40 mg BID> BP soft, plan to hold entresto at discharge.  -Needs to follow up with cardiology to consider start entresto again if renal function and BP allows it.   2-Cellulitis of right leg: -Doppler negative for DVT. -On IV Rocephin, continue. Day 5/5 , last dose today. Antibiotics started 2/10. -resolved.   3-AKI on CKD stage IIIb: Creatinine baseline 1.3 Presented  with a creatinine 1.6 Agree with holding Entresto. Cr down to 1.4    4-Paroxysmal A. Fib: -Loop recorder interrogation -Continue Eliquis.  -cr stable at 1.4.   5-CAD; Statins and beta-blocker  6-Hypokalemia: Discharge on low dose potasium supplement while on lasix. Monitor Potasium.   History of CVA/ ICH Continue with statins.   Dementia: Patient admits he has some issues with his memory. Patient lives alone. Patient will benefit from skilled rehab TSH 2.4, B 12 378  Tobacco abuse: Patient reported quitting several weeks ago Inguinal lymph node. Needs follow up.      Discharge Diagnoses:  Principal Problem:   CHF (congestive heart failure) (HCC) Active Problems:   Hypokalemia   PAF (paroxysmal atrial fibrillation) (HCC)   History of CVA (cerebrovascular accident)   Coronary artery disease   Prolonged QT interval   Dementia ([place])   Cellulitis of right leg    Discharge Instructions  Discharge Instructions    Diet - low sodium heart healthy   Complete by: As directed    Discharge wound care:   Complete by: As directed    See above   Increase activity slowly   Complete by: As directed      Allergies as of 02/20/2020  No Known Allergies     Medication List    STOP taking these medications   sacubitril-valsartan 24-26 MG Commonly known as: ENTRESTO     TAKE these medications   acetaminophen 325 MG tablet Commonly known as: TYLENOL Take 650 mg by mouth every 6 (six) hours as needed for mild pain or headache.   apixaban 2.5 MG Tabs  tablet Commonly known as: ELIQUIS Take 1 tablet (2.5 mg total) by mouth 2 (two) times daily. What changed:   medication strength  how much to take   atorvastatin 40 MG tablet Commonly known as: LIPITOR TAKE 1 TABLET EVERY DAY What changed: when to take this   cyanocobalamin 100 MCG tablet Take 1 tablet (100 mcg total) by mouth daily. Start taking on: February 21, 2020   famotidine 20 MG tablet Commonly known as: PEPCID Take 1 tablet (20 mg total) by mouth at bedtime.   furosemide 40 MG tablet Commonly known as: LASIX Take 1 tablet (40 mg total) by mouth 2 (two) times daily.   latanoprost 0.005 % ophthalmic solution Commonly known as: XALATAN Place 1 drop into both eyes at bedtime.   metoprolol succinate 25 MG 24 hr tablet Commonly known as: TOPROL-XL TAKE 1 TABLET (25 MG TOTAL) BY MOUTH 2 (TWO) TIMES DAILY WITH A MEAL.   multivitamin with minerals Tabs tablet Take 1 tablet by mouth daily.   nitroGLYCERIN 0.4 MG SL tablet Commonly known as: NITROSTAT Place 1 tablet (0.4 mg total) under the tongue every 5 (five) minutes x 3 doses as needed for chest pain. What changed:   when to take this  reasons to take this   OVER THE COUNTER MEDICATION Take 2 capsules by mouth at bedtime. Omega XL   potassium chloride 10 MEQ tablet Commonly known as: KLOR-CON Take 2 tablets (20 mEq total) by mouth daily.            Discharge Care Instructions  (From admission, onward)         Start     Ordered   02/20/20 0000  Discharge wound care:       Comments: See above   02/20/20 1054          Follow-up Information    Jettie Booze, MD Follow up in 1 week(s).   Specialties: Cardiology, Radiology, Interventional Cardiology Contact information: [address] [address] [phone]        Seward Carol, MD Follow up in 2 week(s).   Specialty: Internal Medicine Contact information: [address] [place]  [address] [phone]        Constance Haw, MD .   Specialty: Cardiology Contact information: [address] [place] [address] [place] [address] [phone]              No Known Allergies  Consultations: None  Procedures/Studies: DG Chest Port 1 View  Result Date: 02/15/2020 CLINICAL DATA:  Shortness of breath. EXAM: PORTABLE CHEST 1 VIEW COMPARISON:  Multiple priors, most recent September 05, 2019. FINDINGS: Enlarged cardiac silhouette with central pulmonary vascular congestion. Loop recorder projects over the left chest. Both lungs are clear. No visible pleural effusions or pneumothorax. No acute osseous abnormality. IMPRESSION: Cardiomegaly and central pulmonary vascular congestion without overt pulmonary edema. Electronically Signed   By: Margaretha Sheffield MD   On: 02/15/2020 12:52   ECHOCARDIOGRAM COMPLETE  Result Date: 02/16/2020    ECHOCARDIOGRAM REPORT   Patient Name:   Brian Brown  Date of Exam: 02/16/2020 Medical Rec #:  [id number]        Height:       69.0 in Accession #:    [id number]       Weight:       193.2 lb Date of Birth:  Jun 27, 1932        BSA:          2.036 m Patient Age:    42 years         BP:           128/100 mmHg Patient Gender: M                HR:           85 bpm. Exam Location:  Inpatient Procedure: 2D Echo Indications:    stroke  History:        Patient has prior history of Echocardiogram examinations, most                 recent 09/05/2019. CAD, Arrythmias:Atrial Fibrillation; Risk                 Factors:Hypertension.  Sonographer:    Johny Chess Referring Phys: [id number] RONDELL A SMITH IMPRESSIONS  1. LV apex well-visualized without contrast, no mural thrombus identified. Left ventricular ejection fraction, by estimation, is <20%. The left ventricle has severely decreased function. The left ventricle demonstrates global hypokinesis. There is mild left ventricular hypertrophy. Left ventricular diastolic function could not be evaluated. Elevated  left ventricular end-diastolic pressure.  2. Right ventricular systolic function is moderately reduced. The right ventricular size is mildly enlarged. There is moderately elevated pulmonary artery systolic pressure. The estimated right ventricular systolic pressure is 99.3 mmHg.  3. Left atrial size was moderately dilated.  4. Right atrial size was severely dilated.  5. Moderate pleural effusion in the left lateral region.  6. The mitral valve is abnormal. Moderate mitral valve regurgitation.  7. Tricuspid valve regurgitation is moderate.  8. The aortic valve is tricuspid. Aortic valve regurgitation is not visualized. Mild aortic valve sclerosis is present, with no evidence of aortic valve stenosis.  9. The pulmonic valve was abnormal. 10. Mildly dilated pulmonary artery. 11. The inferior vena cava is dilated in size with <50% respiratory variability, suggesting right atrial pressure of 15 mmHg. Comparison(s): Changes from prior study are noted. 09/05/2019: LVEF 20%, global hypokinesis, moderate RV systolic dysfunction, RVSP 43.5 mmHg. FINDINGS  Left Ventricle: LV apex well-visualized without contrast, no mural thrombus identified. Left ventricular ejection fraction, by estimation, is <20%. The left ventricle has severely decreased function. The left ventricle demonstrates global hypokinesis. The left ventricular internal cavity size was normal in size. There is mild left ventricular hypertrophy. Left ventricular diastolic function could not be evaluated due to atrial fibrillation. Left ventricular diastolic function could not be evaluated. Elevated left ventricular end-diastolic pressure. Right Ventricle: The right ventricular size is mildly enlarged. No increase in right ventricular wall thickness. Right ventricular systolic function is moderately reduced. There is moderately elevated pulmonary artery systolic pressure. The tricuspid regurgitant velocity is 2.90 m/s, and with an assumed right atrial pressure of 15  mmHg, the estimated right ventricular systolic pressure is 71.6 mmHg. Left Atrium: Left atrial size was moderately dilated. Right Atrium: Right atrial size was severely dilated. Pericardium: There is no evidence of pericardial effusion. Mitral Valve: The mitral valve is abnormal. There is moderate thickening of the mitral valve leaflet(s). Moderate mitral valve regurgitation. Tricuspid Valve: The tricuspid valve is  grossly normal. Tricuspid valve regurgitation is moderate. Aortic Valve: The aortic valve is tricuspid. Aortic valve regurgitation is not visualized. Mild aortic valve sclerosis is present, with no evidence of aortic valve stenosis. Pulmonic Valve: The pulmonic valve was abnormal. Pulmonic valve regurgitation is mild to moderate. Aorta: The aortic root and ascending aorta are structurally normal, with no evidence of dilitation. Pulmonary Artery: The pulmonary artery is mildly dilated. Venous: The inferior vena cava is dilated in size with less than 50% respiratory variability, suggesting right atrial pressure of 15 mmHg. IAS/Shunts: No atrial level shunt detected by color flow Doppler. Additional Comments: There is a moderate pleural effusion in the left lateral region.  LEFT VENTRICLE PLAX 2D LVIDd:         4.60 cm  Diastology LVIDs:         4.20 cm  LV e' medial:  4.81 cm/s LV PW:         1.10 cm  LV e' lateral: 4.95 cm/s LV IVS:        1.10 cm LVOT diam:     2.00 cm LVOT Area:     3.14 cm  RIGHT VENTRICLE            IVC RV S prime:     5.77 cm/s  IVC diam: 2.70 cm TAPSE (M-mode): 0.9 cm LEFT ATRIUM             Index       RIGHT ATRIUM           Index LA diam:        4.80 cm 2.36 cm/m  RA Area:     29.20 cm LA Vol (A2C):   76.9 ml 37.77 ml/m RA Volume:   109.00 ml 53.54 ml/m LA Vol (A4C):   76.9 ml 37.77 ml/m LA Biplane Vol: 79.7 ml 39.15 ml/m   AORTA Ao Root diam: 3.20 cm Ao Asc diam:  3.60 cm MR Peak grad:    65.9 mmHg   TRICUSPID VALVE MR Mean grad:    41.0 mmHg   TR Peak grad:   33.6 mmHg MR  Vmax:         406.00 cm/s TR Vmax:        290.00 cm/s MR Vmean:        297.0 cm/s MR PISA:         0.57 cm    SHUNTS MR PISA Eff ROA: 6 mm       Systemic Diam: 2.00 cm MR PISA Radius:  0.30 cm Lyman Bishop MD Electronically signed by Lyman Bishop MD Signature Date/Time: 02/16/2020/11:35:43 AM    Final    VAS [place] LOWER EXTREMITY VENOUS (DVT) (ONLY [facility] & [facility] 7a-7p)  Result Date: 02/15/2020  Lower Venous DVT Study Indications: Swelling and pain in lower extremities. RT>LT.  Limitations: Patient intolerant to compressions in calf. Comparison Study: No prior studies. Performing Technologist: Darlin Coco RDMS  Examination Guidelines: A complete evaluation includes B-mode imaging, spectral Doppler, color Doppler, and power Doppler as needed of all accessible portions of each vessel. Bilateral testing is considered an integral part of a complete examination. Limited examinations for reoccurring indications may be performed as noted. The reflux portion of the exam is performed with the patient in reverse Trendelenburg.  +---------+---------------+---------+-----------+----------+--------------+ RIGHT    CompressibilityPhasicitySpontaneityPropertiesThrombus Aging +---------+---------------+---------+-----------+----------+--------------+ CFV      Full           No       Yes                                 +---------+---------------+---------+-----------+----------+--------------+  SFJ      Full                                                        +---------+---------------+---------+-----------+----------+--------------+ FV Prox  Full                                                        +---------+---------------+---------+-----------+----------+--------------+ FV Mid   Full                                                        +---------+---------------+---------+-----------+----------+--------------+ FV DistalFull                                                         +---------+---------------+---------+-----------+----------+--------------+ PFV      Full                                                        +---------+---------------+---------+-----------+----------+--------------+ POP      Full           No       Yes                                 +---------+---------------+---------+-----------+----------+--------------+ PTV                     No       Yes                                 +---------+---------------+---------+-----------+----------+--------------+ PERO                    No       Yes                                 +---------+---------------+---------+-----------+----------+--------------+   +----+---------------+---------+-----------+----------+--------------+ LEFTCompressibilityPhasicitySpontaneityPropertiesThrombus Aging +----+---------------+---------+-----------+----------+--------------+ CFV Full           Yes      Yes                                 +----+---------------+---------+-----------+----------+--------------+     Summary: RIGHT: - There is no evidence of deep vein thrombosis in the lower extremity. However, portions of this examination were limited- see technologist comments above.  - Waveforms consistent with elevated right heart pressures. - Ultrasound characteristics of enlarged lymph nodes are noted in the groin.  LEFT: - No  evidence of common femoral vein obstruction. - Ultrasound characteristics of enlarged lymph nodes noted in the groin.  *See table(s) above for measurements and observations. Electronically signed by Servando Snare MD on 02/15/2020 at 4:20:21 PM.    Final      Subjective: He is sitting recliner, he is breathing better. His LE has less edema, ace wrap in place.    Discharge Exam: Vitals:   02/19/20 2003 02/20/20 0620  BP: 103/84 (!) 116/93  Pulse: (!) 46 76  Resp: 20 20  Temp: 97.9 F (36.6 C) 97.7 F (36.5 C)  SpO2: 100% 100%     General: Pt is alert, awake,  not in acute distress Cardiovascular: RRR, S1/S2 +, no rubs, no gallops Respiratory: CTA bilaterally, no wheezing, no rhonchi Abdominal: Soft, NT, ND, bowel sounds + Extremities: no edema, no cyanosis    The results of significant diagnostics from this hospitalization (including imaging, microbiology, ancillary and laboratory) are listed below for reference.     Microbiology: Recent Results (from the past 240 hour(s))  SARS CORONAVIRUS 2 (TAT 6-24 HRS) Nasopharyngeal Nasopharyngeal Swab     Status: None   Collection Time: 02/15/20  2:37 PM   Specimen: Nasopharyngeal Swab  Result Value Ref Range Status   SARS Coronavirus 2 NEGATIVE NEGATIVE Final    Comment: (NOTE) SARS-CoV-2 target nucleic acids are NOT DETECTED.  The SARS-CoV-2 RNA is generally detectable in upper and lower respiratory specimens during the acute phase of infection. Negative results do not preclude SARS-CoV-2 infection, do not rule out co-infections with other pathogens, and should not be used as the sole basis for treatment or other patient management decisions. Negative results must be combined with clinical observations, patient history, and epidemiological information. The expected result is Negative.  Fact Sheet for Patients: SugarRoll.be  Fact Sheet for Healthcare Providers: https://www.woods-mathews.com/  This test is not yet approved or cleared by the [place] FDA and  has been authorized for detection and/or diagnosis of SARS-CoV-2 by FDA under an Emergency Use Authorization (EUA). This EUA will remain  in effect (meaning this test can be used) for the duration of the COVID-19 declaration under Se ction 564(b)(1) of the Act, 21 U.S.C. section 360bbb-3(b)(1), unless the authorization is terminated or revoked sooner.  Performed at [facility], [place] [address] [place], [place] [address]   SARS CORONAVIRUS 2 (TAT 6-24 HRS) Nasopharyngeal  Nasopharyngeal Swab     Status: None   Collection Time: 02/19/20 12:01 PM   Specimen: Nasopharyngeal Swab  Result Value Ref Range Status   SARS Coronavirus 2 NEGATIVE NEGATIVE Final    Comment: (NOTE) SARS-CoV-2 target nucleic acids are NOT DETECTED.  The SARS-CoV-2 RNA is generally detectable in upper and lower respiratory specimens during the acute phase of infection. Negative results do not preclude SARS-CoV-2 infection, do not rule out co-infections with other pathogens, and should not be used as the sole basis for treatment or other patient management decisions. Negative results must be combined with clinical observations, patient history, and epidemiological information. The expected result is Negative.  Fact Sheet for Patients: SugarRoll.be  Fact Sheet for Healthcare Providers: https://www.woods-mathews.com/  This test is not yet approved or cleared by the [place] FDA and  has been authorized for detection and/or diagnosis of SARS-CoV-2 by FDA under an Emergency Use Authorization (EUA). This EUA will remain  in effect (meaning this test can be used) for the duration of the COVID-19 declaration under Se ction 564(b)(1) of the Act, 21 U.S.C. section  360bbb-3(b)(1), unless the authorization is terminated or revoked sooner.  Performed at [facility], [place] [address] [place], [place] [address]      Labs: BNP (last 3 results) Recent Labs    08/31/19 1833 02/15/20 1202 02/19/20 0855  BNP 1,344.2* 7,035.0* 0,938.1*   Basic Metabolic Panel: Recent Labs  Lab 02/15/20 1202 02/16/20 0319 02/17/20 0415 02/18/20 0358 02/19/20 0229 02/20/20 0645  NA 144 143 140 139 139 141  K 3.3* 3.4* 3.8 3.5 3.4* 3.6  CL 107 104 105 102 103 102  CO2 27 29 23 27 27 29   GLUCOSE 96 99 100* 98 92 95  BUN 25* 24* 26* 26* 28* 30*  CREATININE 1.64* 1.57* 1.36* 1.49* 1.59* 1.46*  CALCIUM 8.7* 8.9 9.0 8.7* 8.6* 9.1  MG 2.0  --   --    --   --   --    Liver Function Tests: Recent Labs  Lab 02/15/20 1202  AST 24  ALT 23  ALKPHOS 69  BILITOT 1.4*  PROT 6.5  ALBUMIN 3.0*   No results for input(s): LIPASE, AMYLASE in the last 168 hours. No results for input(s): AMMONIA in the last 168 hours. CBC: Recent Labs  Lab 02/15/20 1202 02/17/20 0415  WBC 5.3 7.8  NEUTROABS 3.6  --   HGB 13.1 12.5*  HCT 39.5 36.3*  MCV 90.8 87.9  PLT 220 195   Cardiac Enzymes: No results for input(s): CKTOTAL, CKMB, CKMBINDEX, TROPONINI in the last 168 hours. BNP: Invalid input(s): POCBNP CBG: No results for input(s): GLUCAP in the last 168 hours. D-Dimer No results for input(s): DDIMER in the last 72 hours. Hgb A1c No results for input(s): HGBA1C in the last 72 hours. Lipid Profile No results for input(s): CHOL, HDL, LDLCALC, TRIG, CHOLHDL, LDLDIRECT in the last 72 hours. Thyroid function studies No results for input(s): TSH, T4TOTAL, T3FREE, THYROIDAB in the last 72 hours.  Invalid input(s): FREET3 Anemia work up No results for input(s): VITAMINB12, FOLATE, FERRITIN, TIBC, IRON, RETICCTPCT in the last 72 hours. Urinalysis    Component Value Date/Time   COLORURINE AMBER (A) 08/31/2019 1322   APPEARANCEUR HAZY (A) 08/31/2019 1322   LABSPEC 1.025 08/31/2019 1322   PHURINE 5.0 08/31/2019 1322   GLUCOSEU NEGATIVE 08/31/2019 1322   HGBUR SMALL (A) 08/31/2019 1322   BILIRUBINUR NEGATIVE 08/31/2019 1322   KETONESUR NEGATIVE 08/31/2019 1322   PROTEINUR 100 (A) 08/31/2019 1322   UROBILINOGEN 0.2 01/27/2011 1233   NITRITE NEGATIVE 08/31/2019 1322   LEUKOCYTESUR NEGATIVE 08/31/2019 1322   Sepsis Labs Invalid input(s): PROCALCITONIN,  WBC,  LACTICIDVEN Microbiology Recent Results (from the past 240 hour(s))  SARS CORONAVIRUS 2 (TAT 6-24 HRS) Nasopharyngeal Nasopharyngeal Swab     Status: None   Collection Time: 02/15/20  2:37 PM   Specimen: Nasopharyngeal Swab  Result Value Ref Range Status   SARS Coronavirus 2 NEGATIVE  NEGATIVE Final    Comment: (NOTE) SARS-CoV-2 target nucleic acids are NOT DETECTED.  The SARS-CoV-2 RNA is generally detectable in upper and lower respiratory specimens during the acute phase of infection. Negative results do not preclude SARS-CoV-2 infection, do not rule out co-infections with other pathogens, and should not be used as the sole basis for treatment or other patient management decisions. Negative results must be combined with clinical observations, patient history, and epidemiological information. The expected result is Negative.  Fact Sheet for Patients: SugarRoll.be  Fact Sheet for Healthcare Providers: https://www.woods-mathews.com/  This test is not yet approved or cleared by the  [place] Architectural technologist and  has been authorized for detection and/or diagnosis of SARS-CoV-2 by FDA under an Print production planner (EUA). This EUA will remain  in effect (meaning this test can be used) for the duration of the COVID-19 declaration under Se ction 564(b)(1) of the Act, 21 U.S.C. section 360bbb-3(b)(1), unless the authorization is terminated or revoked sooner.  Performed at [facility], [place] [address], [place], [place] [address]   SARS CORONAVIRUS 2 (TAT 6-24 HRS) Nasopharyngeal Nasopharyngeal Swab     Status: None   Collection Time: 02/19/20 12:01 PM   Specimen: Nasopharyngeal Swab  Result Value Ref Range Status   SARS Coronavirus 2 NEGATIVE NEGATIVE Final    Comment: (NOTE) SARS-CoV-2 target nucleic acids are NOT DETECTED.  The SARS-CoV-2 RNA is generally detectable in upper and lower respiratory specimens during the acute phase of infection. Negative results do not preclude SARS-CoV-2 infection, do not rule out co-infections with other pathogens, and should not be used as the sole basis for treatment or other patient management decisions. Negative results must be combined with clinical observations, patient  history, and epidemiological information. The expected result is Negative.  Fact Sheet for Patients: SugarRoll.be  Fact Sheet for Healthcare Providers: https://www.woods-mathews.com/  This test is not yet approved or cleared by the [place] FDA and  has been authorized for detection and/or diagnosis of SARS-CoV-2 by FDA under an Emergency Use Authorization (EUA). This EUA will remain  in effect (meaning this test can be used) for the duration of the COVID-19 declaration under Se ction 564(b)(1) of the Act, 21 U.S.C. section 360bbb-3(b)(1), unless the authorization is terminated or revoked sooner.  Performed at [facility], [place] [address], [place], [place] [address]      Time coordinating discharge: 40 minutes  SIGNED:   Elmarie Shiley, MD  [facility]

## 2020-02-20 NOTE — Plan of Care (Signed)
°  Problem: Education: °Goal: Ability to demonstrate management of disease process will improve °Outcome: Progressing °Goal: Ability to verbalize understanding of medication therapies will improve °Outcome: Progressing °Goal: Individualized Educational Video(s) °Outcome: Progressing °  °

## 2020-02-21 DIAGNOSIS — M79661 Pain in right lower leg: Secondary | ICD-10-CM | POA: Diagnosis not present

## 2020-02-21 DIAGNOSIS — M79671 Pain in right foot: Secondary | ICD-10-CM | POA: Diagnosis not present

## 2020-02-21 DIAGNOSIS — R600 Localized edema: Secondary | ICD-10-CM | POA: Diagnosis not present

## 2020-02-21 DIAGNOSIS — F0390 Unspecified dementia without behavioral disturbance: Secondary | ICD-10-CM | POA: Diagnosis not present

## 2020-02-21 DIAGNOSIS — L03115 Cellulitis of right lower limb: Secondary | ICD-10-CM | POA: Diagnosis not present

## 2020-02-22 DIAGNOSIS — I480 Paroxysmal atrial fibrillation: Secondary | ICD-10-CM | POA: Diagnosis not present

## 2020-02-22 DIAGNOSIS — N179 Acute kidney failure, unspecified: Secondary | ICD-10-CM | POA: Diagnosis not present

## 2020-02-22 DIAGNOSIS — I2510 Atherosclerotic heart disease of native coronary artery without angina pectoris: Secondary | ICD-10-CM | POA: Diagnosis not present

## 2020-02-22 DIAGNOSIS — I5043 Acute on chronic combined systolic (congestive) and diastolic (congestive) heart failure: Secondary | ICD-10-CM | POA: Diagnosis not present

## 2020-02-24 ENCOUNTER — Emergency Department (HOSPITAL_COMMUNITY): Payer: Medicare HMO

## 2020-02-24 ENCOUNTER — Inpatient Hospital Stay (HOSPITAL_COMMUNITY)
Admission: EM | Admit: 2020-02-24 | Discharge: 2020-03-02 | DRG: 291 | Disposition: A | Discharge status: Hospice | Payer: Medicare HMO | Source: Skilled Nursing Facility | Attending: Internal Medicine | Admitting: Internal Medicine

## 2020-02-24 ENCOUNTER — Other Ambulatory Visit: Payer: Self-pay

## 2020-02-24 DIAGNOSIS — N179 Acute kidney failure, unspecified: Secondary | ICD-10-CM | POA: Diagnosis present

## 2020-02-24 DIAGNOSIS — I10 Essential (primary) hypertension: Secondary | ICD-10-CM

## 2020-02-24 DIAGNOSIS — Z7901 Long term (current) use of anticoagulants: Secondary | ICD-10-CM

## 2020-02-24 DIAGNOSIS — I63432 Cerebral infarction due to embolism of left posterior cerebral artery: Secondary | ICD-10-CM

## 2020-02-24 DIAGNOSIS — R0902 Hypoxemia: Secondary | ICD-10-CM

## 2020-02-24 DIAGNOSIS — I610 Nontraumatic intracerebral hemorrhage in hemisphere, subcortical: Secondary | ICD-10-CM

## 2020-02-24 DIAGNOSIS — I480 Paroxysmal atrial fibrillation: Secondary | ICD-10-CM | POA: Diagnosis present

## 2020-02-24 DIAGNOSIS — Z515 Encounter for palliative care: Secondary | ICD-10-CM

## 2020-02-24 DIAGNOSIS — Z66 Do not resuscitate: Secondary | ICD-10-CM | POA: Diagnosis not present

## 2020-02-24 DIAGNOSIS — Z96642 Presence of left artificial hip joint: Secondary | ICD-10-CM | POA: Diagnosis present

## 2020-02-24 DIAGNOSIS — G4730 Sleep apnea, unspecified: Secondary | ICD-10-CM | POA: Diagnosis present

## 2020-02-24 DIAGNOSIS — I252 Old myocardial infarction: Secondary | ICD-10-CM | POA: Diagnosis not present

## 2020-02-24 DIAGNOSIS — E876 Hypokalemia: Secondary | ICD-10-CM | POA: Diagnosis not present

## 2020-02-24 DIAGNOSIS — R609 Edema, unspecified: Secondary | ICD-10-CM | POA: Diagnosis not present

## 2020-02-24 DIAGNOSIS — F0390 Unspecified dementia without behavioral disturbance: Secondary | ICD-10-CM | POA: Diagnosis not present

## 2020-02-24 DIAGNOSIS — R531 Weakness: Secondary | ICD-10-CM | POA: Diagnosis not present

## 2020-02-24 DIAGNOSIS — J9621 Acute and chronic respiratory failure with hypoxia: Secondary | ICD-10-CM | POA: Diagnosis not present

## 2020-02-24 DIAGNOSIS — I2510 Atherosclerotic heart disease of native coronary artery without angina pectoris: Secondary | ICD-10-CM | POA: Diagnosis not present

## 2020-02-24 DIAGNOSIS — Z9119 Patient's noncompliance with other medical treatment and regimen: Secondary | ICD-10-CM | POA: Diagnosis not present

## 2020-02-24 DIAGNOSIS — I5023 Acute on chronic systolic (congestive) heart failure: Secondary | ICD-10-CM

## 2020-02-24 DIAGNOSIS — I69251 Hemiplegia and hemiparesis following other nontraumatic intracranial hemorrhage affecting right dominant side: Secondary | ICD-10-CM

## 2020-02-24 DIAGNOSIS — Z87891 Personal history of nicotine dependence: Secondary | ICD-10-CM

## 2020-02-24 DIAGNOSIS — I509 Heart failure, unspecified: Secondary | ICD-10-CM | POA: Diagnosis not present

## 2020-02-24 DIAGNOSIS — I517 Cardiomegaly: Secondary | ICD-10-CM | POA: Diagnosis not present

## 2020-02-24 DIAGNOSIS — I130 Hypertensive heart and chronic kidney disease with heart failure and stage 1 through stage 4 chronic kidney disease, or unspecified chronic kidney disease: Principal | ICD-10-CM | POA: Diagnosis present

## 2020-02-24 DIAGNOSIS — I272 Pulmonary hypertension, unspecified: Secondary | ICD-10-CM | POA: Diagnosis present

## 2020-02-24 DIAGNOSIS — I255 Ischemic cardiomyopathy: Secondary | ICD-10-CM | POA: Diagnosis present

## 2020-02-24 DIAGNOSIS — R404 Transient alteration of awareness: Secondary | ICD-10-CM | POA: Diagnosis not present

## 2020-02-24 DIAGNOSIS — Z8546 Personal history of malignant neoplasm of prostate: Secondary | ICD-10-CM | POA: Diagnosis not present

## 2020-02-24 DIAGNOSIS — I619 Nontraumatic intracerebral hemorrhage, unspecified: Secondary | ICD-10-CM | POA: Diagnosis present

## 2020-02-24 DIAGNOSIS — I77810 Thoracic aortic ectasia: Secondary | ICD-10-CM | POA: Diagnosis present

## 2020-02-24 DIAGNOSIS — E785 Hyperlipidemia, unspecified: Secondary | ICD-10-CM | POA: Diagnosis present

## 2020-02-24 DIAGNOSIS — Z7189 Other specified counseling: Secondary | ICD-10-CM | POA: Diagnosis not present

## 2020-02-24 DIAGNOSIS — J811 Chronic pulmonary edema: Secondary | ICD-10-CM | POA: Diagnosis not present

## 2020-02-24 DIAGNOSIS — I214 Non-ST elevation (NSTEMI) myocardial infarction: Secondary | ICD-10-CM

## 2020-02-24 DIAGNOSIS — J90 Pleural effusion, not elsewhere classified: Secondary | ICD-10-CM | POA: Diagnosis not present

## 2020-02-24 DIAGNOSIS — Z789 Other specified health status: Secondary | ICD-10-CM

## 2020-02-24 DIAGNOSIS — Z20822 Contact with and (suspected) exposure to covid-19: Secondary | ICD-10-CM | POA: Diagnosis not present

## 2020-02-24 DIAGNOSIS — I110 Hypertensive heart disease with heart failure: Secondary | ICD-10-CM | POA: Diagnosis not present

## 2020-02-24 DIAGNOSIS — I493 Ventricular premature depolarization: Secondary | ICD-10-CM | POA: Diagnosis not present

## 2020-02-24 DIAGNOSIS — R0602 Shortness of breath: Secondary | ICD-10-CM | POA: Diagnosis not present

## 2020-02-24 DIAGNOSIS — R0689 Other abnormalities of breathing: Secondary | ICD-10-CM | POA: Diagnosis not present

## 2020-02-24 DIAGNOSIS — N1832 Chronic kidney disease, stage 3b: Secondary | ICD-10-CM | POA: Diagnosis present

## 2020-02-24 DIAGNOSIS — I639 Cerebral infarction, unspecified: Secondary | ICD-10-CM | POA: Diagnosis present

## 2020-02-24 DIAGNOSIS — R5381 Other malaise: Secondary | ICD-10-CM | POA: Diagnosis not present

## 2020-02-24 DIAGNOSIS — R9431 Abnormal electrocardiogram [ECG] [EKG]: Secondary | ICD-10-CM | POA: Diagnosis not present

## 2020-02-24 DIAGNOSIS — Z79899 Other long term (current) drug therapy: Secondary | ICD-10-CM

## 2020-02-24 DIAGNOSIS — Z8249 Family history of ischemic heart disease and other diseases of the circulatory system: Secondary | ICD-10-CM

## 2020-02-24 LAB — I-STAT ARTERIAL BLOOD GAS, ED
Acid-Base Excess: 4 mmol/L — ABNORMAL HIGH (ref 0.0–2.0)
Bicarbonate: 26.4 mmol/L (ref 20.0–28.0)
Calcium, Ion: 1.21 mmol/L (ref 1.15–1.40)
HCT: 41 % (ref 39.0–52.0)
Hemoglobin: 13.9 g/dL (ref 13.0–17.0)
O2 Saturation: 100 %
Patient temperature: 98.6
Potassium: 4.1 mmol/L (ref 3.5–5.1)
Sodium: 140 mmol/L (ref 135–145)
TCO2: 27 mmol/L (ref 22–32)
pCO2 arterial: 32.9 mmHg (ref 32.0–48.0)
pH, Arterial: 7.513 — ABNORMAL HIGH (ref 7.350–7.450)
pO2, Arterial: 200 mmHg — ABNORMAL HIGH (ref 83.0–108.0)

## 2020-02-24 LAB — RESP PANEL BY RT-PCR (FLU A&B, COVID) ARPGX2
Influenza A by PCR: NEGATIVE
Influenza B by PCR: NEGATIVE
SARS Coronavirus 2 by RT PCR: NEGATIVE

## 2020-02-24 LAB — BASIC METABOLIC PANEL
Anion gap: 13 (ref 5–15)
BUN: 38 mg/dL — ABNORMAL HIGH (ref 8–23)
CO2: 25 mmol/L (ref 22–32)
Calcium: 9.6 mg/dL (ref 8.9–10.3)
Chloride: 103 mmol/L (ref 98–111)
Creatinine, Ser: 2.19 mg/dL — ABNORMAL HIGH (ref 0.61–1.24)
GFR, Estimated: 29 mL/min — ABNORMAL LOW (ref >60)
Glucose, Bld: 95 mg/dL (ref 70–99)
Potassium: 4.5 mmol/L (ref 3.5–5.1)
Sodium: 141 mmol/L (ref 135–145)

## 2020-02-24 LAB — CBC
HCT: 44.1 % (ref 39.0–52.0)
Hemoglobin: 14.3 g/dL (ref 13.0–17.0)
MCH: 29.5 pg (ref 26.0–34.0)
MCHC: 32.4 g/dL (ref 30.0–36.0)
MCV: 91.1 fL (ref 80.0–100.0)
Platelets: 200 10*3/uL (ref 150–400)
RBC: 4.84 MIL/uL (ref 4.22–5.81)
RDW: 14.9 % (ref 11.5–15.5)
WBC: 7.7 10*3/uL (ref 4.0–10.5)
nRBC: 0 % (ref 0.0–0.2)

## 2020-02-24 LAB — BRAIN NATRIURETIC PEPTIDE: B Natriuretic Peptide: 2617 pg/mL — ABNORMAL HIGH (ref 0.0–100.0)

## 2020-02-24 MED ORDER — HYDRALAZINE HCL 20 MG/ML IJ SOLN
5.0000 mg | Freq: Four times a day (QID) | INTRAMUSCULAR | Status: DC | PRN
  Filled 2020-02-24: qty 1

## 2020-02-24 MED ORDER — FUROSEMIDE 10 MG/ML IJ SOLN
40.0000 mg | Freq: Two times a day (BID) | INTRAMUSCULAR | Status: DC
  Administered 2020-02-24 – 2020-02-27 (×7): 40 mg via INTRAVENOUS
  Filled 2020-02-24 (×7): qty 4

## 2020-02-24 MED ORDER — VITAMIN B-12 100 MCG PO TABS
100.0000 ug | ORAL_TABLET | Freq: Every day | ORAL | Status: DC
  Administered 2020-02-24 – 2020-03-02 (×7): 100 ug via ORAL
  Filled 2020-02-24 (×8): qty 1

## 2020-02-24 MED ORDER — FAMOTIDINE 20 MG PO TABS
20.0000 mg | ORAL_TABLET | Freq: Every day | ORAL | Status: DC
  Administered 2020-02-24 – 2020-03-01 (×7): 20 mg via ORAL
  Filled 2020-02-24 (×9): qty 1

## 2020-02-24 MED ORDER — NITROGLYCERIN 0.4 MG SL SUBL: 0.4000 mg | SUBLINGUAL_TABLET | SUBLINGUAL | Status: DC | PRN

## 2020-02-24 MED ORDER — OLANZAPINE 5 MG PO TBDP
5.0000 mg | ORAL_TABLET | Freq: Two times a day (BID) | ORAL | Status: DC | PRN
  Administered 2020-02-25 – 2020-03-01 (×6): 5 mg via ORAL
  Filled 2020-02-24 (×9): qty 1

## 2020-02-24 MED ORDER — OLANZAPINE 2.5 MG PO TABS: 2.5000 mg | ORAL_TABLET | Freq: Two times a day (BID) | ORAL | Status: DC | PRN

## 2020-02-24 MED ORDER — METOPROLOL TARTRATE 25 MG PO TABS
25.0000 mg | ORAL_TABLET | Freq: Two times a day (BID) | ORAL | Status: AC
  Administered 2020-02-25 – 2020-02-27 (×6): 25 mg via ORAL
  Filled 2020-02-24 (×7): qty 1

## 2020-02-24 MED ORDER — ATORVASTATIN CALCIUM 40 MG PO TABS
40.0000 mg | ORAL_TABLET | Freq: Every day | ORAL | Status: DC
  Administered 2020-02-24 – 2020-03-02 (×7): 40 mg via ORAL
  Filled 2020-02-24 (×7): qty 1

## 2020-02-24 MED ORDER — SODIUM CHLORIDE 0.9% FLUSH
3.0000 mL | Freq: Two times a day (BID) | INTRAVENOUS | Status: DC
  Administered 2020-02-25 – 2020-03-02 (×12): 3 mL via INTRAVENOUS

## 2020-02-24 MED ORDER — FUROSEMIDE 10 MG/ML IJ SOLN
40.0000 mg | Freq: Once | INTRAMUSCULAR | Status: AC
  Administered 2020-02-24: 40 mg via INTRAVENOUS
  Filled 2020-02-24: qty 4

## 2020-02-24 MED ORDER — FISH OIL 1000 MG PO CAPS: 1000.0000 mg | ORAL_CAPSULE | Freq: Every day | ORAL | Status: DC

## 2020-02-24 MED ORDER — POTASSIUM CHLORIDE CRYS ER 10 MEQ PO TBCR
20.0000 meq | EXTENDED_RELEASE_TABLET | Freq: Every day | ORAL | Status: DC
  Administered 2020-02-24 – 2020-02-27 (×4): 20 meq via ORAL
  Filled 2020-02-24 (×7): qty 2

## 2020-02-24 MED ORDER — OMEGA-3-ACID ETHYL ESTERS 1 G PO CAPS
1.0000 g | ORAL_CAPSULE | Freq: Every day | ORAL | Status: DC
  Administered 2020-02-24 – 2020-03-02 (×7): 1 g via ORAL
  Filled 2020-02-24 (×9): qty 1

## 2020-02-24 MED ORDER — APIXABAN 2.5 MG PO TABS
2.5000 mg | ORAL_TABLET | Freq: Two times a day (BID) | ORAL | Status: DC
  Administered 2020-02-24 – 2020-03-02 (×14): 2.5 mg via ORAL
  Filled 2020-02-24 (×15): qty 1

## 2020-02-24 MED ORDER — LATANOPROST 0.005 % OP SOLN
1.0000 [drp] | Freq: Every day | OPHTHALMIC | Status: DC
  Administered 2020-02-24 – 2020-03-01 (×6): 1 [drp] via OPHTHALMIC
  Filled 2020-02-24: qty 2.5

## 2020-02-24 MED ORDER — ACETAMINOPHEN 325 MG PO TABS: 650.0000 mg | ORAL_TABLET | ORAL | Status: DC | PRN

## 2020-02-24 MED ORDER — SODIUM CHLORIDE 0.9% FLUSH
3.0000 mL | INTRAVENOUS | Status: DC | PRN
Start: 2020-02-24 — End: 2020-03-02

## 2020-02-24 NOTE — Progress Notes (Signed)
Placed patient on BiPAP per order, patient is not tolerating well and is trying to pull the mask off, I informed the patient to try to keep it on a little longer because your  providers seems to think it will help you with your breathing difficulty.

## 2020-02-24 NOTE — Consult Note (Signed)
Consultation Note Date: 02/24/2020   Patient Name: Brian Brown  DOB: 25-Apr-1932  MRN: [id number]  Age / Sex: 87 y.o., male  PCP: Brian Carol, MD Referring Physician: Guil[place] Shi, MD  Reason for Consultation: Establishing goals of care  HPI/Patient Profile: 87 y.o. male  with past medical history of CAD, NSTEMI, CHF with EF 20%, atrial fibrillation, CVA with residual right sided weakness, ICH, prostate cancer, CKD, and dementia presented to the ED on 02/24/20 from [facility] with complaints of leg swelling and shortness of breath. Patient was recently hospitalized from 2/10-2/15 for CHF exacerbation. He is admitted on 02/24/2020 with acute on chronic CHF exacerbation, acute on chronic respiratory failure with hypoxia, and acute on chronic CKD.  Of note, patient is already being followed by outpatient Palliative Care with [facility].     Clinical Assessment and Goals of Care: I have reviewed medical records including EPIC notes, labs, and imaging. Received report from RN - no acute concerns. RN reports patient has had episodes of increased confusion and agitation while on BiPap.  Went to visit patient at bedside - no family/visitors present. Patient was lying in bed awake, alert, oriented (to person, place, time, situation), and able to participate in conversation at the beginning of my visit - at the end of my visit he became disoriented and unable to make complex medical decisions. No signs or non-verbal gestures of pain or discomfort noted. No respiratory distress, increased work of breathing, or secretions noted. Patient denied pain and states his shortness of breath "comes and goes." He is on 5L O2 [place].   Met with patient  to discuss diagnosis, prognosis, GOC, EOL wishes, disposition, and options.  I introduced Palliative Medicine as specialized medical care for people living with serious  illness. It focuses on providing relief from the symptoms and stress of a serious illness. The goal is to improve quality of life for both the patient and the family.  We discussed a brief life review of the patient as well as functional and nutritional status. Patient states he is not married. He has 3 daughters, 2 step daughters, and 1 god-son. He was not able to tell me where he was prior to this hospitalization but does tell me he has a good appetite and is eating well. We discussed his chronic illness and disease trajectories.  We discussed advanced directives and code status. Brian Brown wants his daughter/Brian Brown to make medical decisions on his behalf if he is unable to - he is unsure if he has a HCPOA or Living Will. Encouraged patient to consider DNR/DNI status understanding evidenced based poor outcomes in similar hospitalized patient, as the cause of arrest is likely associated with advanced chronic/terminal illness rather than an easily reversible acute cardio-pulmonary event. I explained that DNR/DNI does not change the medical plan and it only comes into effect after a person has arrested (died).  It is a protective measure to keep [place] from harming the patient in their last moments of life. Patient was agreeable to DNR/DNI  with understanding that he would not receive CPR, defibrillation, ACLS medications, or intubation. Patient stated "I want to go peacefully and naturally."  After discussing code status - patient began to state we were on his porch at home as well as on [place]. Patient was no longer oriented or able to participate in complex discussions or medical decision making.  I called his daughter/Brian Brown to discuss diagnosis, prognosis, GOC, EOL wishes, disposition, and options. I introduced Palliative Medicine as specialized medical care for people living with serious illness. It focuses on providing relief from the symptoms and stress of a serious illness. The goal is to improve quality  of life for both the patient and the family.  We discussed the patients functional and nutritional status prior to hospitalization. She explained he was brought from [facility] - he was discharged from hospitalization last week to this facility for rehab. Prior to hospitalization one week ago, patient was living at home a lone. She states the patient was able to dress himself, bath himself, and ambulate with minimal assistance and was participating in rehab with hopes to regain his strength to return home. Brian Brown expressed concern that [facility] was not administering the patient's medications as ordered because of his quick decline - she and her family are wondering if this is the cause of his current exacerbation - validation provided that this would be concerning.  We discussed patient's current illness and what it means in the larger context of patient's on-going co-morbidities. Natural disease trajectory for CHF was discussed. We reviewed that CHF, CKD, and dementia are all unfortunately progressive, non-curable diseases and his CHF exacerbations could be from disease progression rather than missed medications. I attempted to elicit values and goals of care important to the patient. The difference between aggressive medical intervention and comfort care was considered in light of the patient's goals of care. The goal at this time is to continue current medical treatment with watchful waiting and time for outcomes. Brian Brown would like to get more information from Heart Failure team before making any final decisions. Therapeutic listening was provided as she expressed again her worry Brian Brown was not getting the appropriate medications at rehab facility.  Advance directives, concepts specific to code status, and rehospitalization were considered and discussed. Brian Brown does not have HCPOA paperwork - but I did explain to her that patient stated he would want her to make medical decisions on  his behalf if needed. I updated Brian Brown on conversation I had with patient regarding DNR/DNI. Encouraged family to consider DNR/DNI status understanding evidenced based poor outcomes in similar hospitalized patient, as the cause of arrest is likely associated with advanced chronic/terminal illness rather than an easily reversible acute cardio-pulmonary event. I explained that DNR/DNI does not change the medical plan and it only comes into effect after a person has arrested (died).  It is a protective measure to keep [place] from harming the patient in their last moments of life. We reviewed that if he were to survive a resuscitative event he would continue to be in poor health with likely poor quality of life. Brian Brown was not agreeable to DNR/DNI with understanding that he would receive CPR, defibrillation, ACLS medications, or intubation. Brian Brown states he has expressed in the past he would want everything done but not have long term life support. Brian Brown requests time to speak with patient and continue conversation with him before changing code status. We did discuss that as people's health changes over time and quality of  life declines, people often change their minds on what they are willing to go through based on the quality of life we would bring them back to - she expressed understanding and would like to continue full code for now.  Discussed with patient/family the importance of continued conversation with each other and the medical providers regarding overall plan of care and treatment options, ensuring decisions are within the context of the patient's values and GOCs.    Questions and concerns were addressed. The patient/family was encouraged to call with questions and/or concerns. PMT number was provided.   Primary Decision Maker: NEXT OF KIN daughter/Brian Brown Rezek     SUMMARY OF RECOMMENDATIONS:  Continue full scope medical treatment with watchful waiting and time for outcomes  Continue full code  status  Daughter would like to get Heart Failure team's input before making further decisions  Patient is already followed by outpatient Palliative Care with [facility] - notified [place] liaison of patient's admission  Ongoing PMT discussions pending clinical course  PMT will continue to follow and support holistically   Code Status/Advance Care Planning:  Full code  Palliative Prophylaxis:   Aspiration, Bowel Regimen, Delirium Protocol, Frequent Pain Assessment, Oral Care and Turn Reposition  Additional Recommendations (Limitations, Scope, Preferences):  Full Scope Treatment  Psycho-social/Spiritual:   Desire for further Chaplaincy support:no Created space and opportunity for patient and family to express thoughts and feelings regarding patient's current medical situation.   Emotional support and therapeutic listening provided.  Prognosis:   Unable to determine  Discharge Planning: To Be Determined      Primary Diagnoses: Present on Admission: . Acute on chronic systolic CHF (congestive heart failure) ([place]) . AKI (acute kidney injury) ([place]) . Coronary artery disease . Dementia ([place]) . Intracerebral hemorrhage ([place]) . Hypertension . NSTEMI (non-ST elevated myocardial infarction) ([place]) . PAF (paroxysmal atrial fibrillation) ([place]) . Stroke/cerebrovascular accident ([place]) . Sleep apnea   I have reviewed the medical record, interviewed the patient and family, and examined the patient. The following aspects are pertinent.  Past Medical History:  Diagnosis Date  . Arthritis    degenerative joint disease  . Bleeding stomach ulcer 25 yrs ago  . CAD (coronary artery disease)    a. 10/2018 NSTEMI/Cath: LM nl, LAD 60p/m, 30d, D1 90, LCX 50ost/p, OM2 90 thrombotic, RCA 30p, 20d-->med rx.  . CHF (congestive heart failure) ([place])   . CKD (chronic kidney disease), stage II   . Hepatitis 1981   does not know what kind;denies jaundice  . HFrEF (heart failure with reduced  ejection fraction) ([place])    a. 08/2019 Echo: EF 21%.  Marland Kitchen Hypertension   . Ischemic cardiomyopathy    a. 02/2018 Echo: EF 60-65%; b. 10/2018 Echo: EF 45-50%; c. 08/2019 Echo: EF 21%, mild LVH. RVSP 41.70mmHg. Mod BAE. Mild MR. Triv AI.  . Mild dilation of ascending aorta ([place])   . PAF (paroxysmal atrial fibrillation) ([place])    a. 10/2018 noted on Linq-->eliquis initiated (CHA2DS2VASc = 7) however d/c'd in 08/2019 2/2 cerebral bleed.  . Prostate cancer [facility]) 2002   prostate, s/p surgery  . Sleep apnea 2005   does not wear a Cpap  . Stroke [facility])    a. 2003 mild residual right leg wkns; b. 08/6765 embolic stroke, later found to have PAF.  Marland Kitchen Wears dentures    full top  . Wears glasses    Social History   Socioeconomic History  . Marital status: Widowed    Spouse name: Not on file  .  Number of children: Not on file  . Years of education: Not on file  . Highest education level: Not on file  Occupational History  . Not on file  Tobacco Use  . Smoking status: Former Smoker    Packs/day: 1.00    Years: 30.00    Pack years: 30.00    Types: Cigarettes    Quit date: 01/27/2008    Years since quitting: 12.0  . Smokeless tobacco: Never Used  Vaping Use  . Vaping Use: Never used  Substance and Sexual Activity  . Alcohol use: No  . Drug use: No  . Sexual activity: Not on file  Other Topics Concern  . Not on file  Social History Narrative   Lives in [place] by himself, with his bulldog.  He walks his dog daily.  3 daughters in [place].   Social Determinants of Health   Financial Resource Strain: Not on file  Food Insecurity: Not on file  Transportation Needs: Not on file  Physical Activity: Not on file  Stress: Not on file  Social Connections: Not on file   Family History  Problem Relation Age of Onset  . Heart disease Mother   . Heart disease Father    Scheduled Meds: . apixaban  2.5 mg Oral BID  . atorvastatin  40 mg Oral Daily  . famotidine  20 mg Oral QHS  . furosemide  40 mg  Intravenous Q12H  . latanoprost  1 drop Both Eyes QHS  . metoprolol tartrate  25 mg Oral BID  . omega-3 acid ethyl esters  1 g Oral Daily  . potassium chloride  20 mEq Oral Daily  . sodium chloride flush  3 mL Intravenous Q12H  . cyanocobalamin  100 mcg Oral Daily   Continuous Infusions: PRN Meds:.acetaminophen, hydrALAZINE, nitroGLYCERIN, OLANZapine zydis, sodium chloride flush Medications Prior to Admission:  Prior to Admission medications   Medication Sig Start Date End Date Taking? Authorizing Provider  acetaminophen (TYLENOL) 325 MG tablet Take 650 mg by mouth every 6 (six) hours as needed for mild pain or headache.   Yes [provider]  apixaban (ELIQUIS) 2.5 MG TABS tablet Take 1 tablet (2.5 mg total) by mouth 2 (two) times daily. 02/20/20  Yes Regalado, Belkys A, MD  atorvastatin (LIPITOR) 40 MG tablet TAKE 1 TABLET EVERY DAY Patient taking differently: Take 40 mg by mouth in the morning. 07/24/19  Yes Jettie Booze, MD  famotidine (PEPCID) 20 MG tablet Take 1 tablet (20 mg total) by mouth at bedtime. 08/11/19  Yes Little Ishikawa, MD  furosemide (LASIX) 40 MG tablet Take 1 tablet (40 mg total) by mouth 2 (two) times daily. 02/05/20  Yes Jettie Booze, MD  latanoprost (XALATAN) 0.005 % ophthalmic solution Place 1 drop into both eyes at bedtime.   Yes [provider]  metoprolol tartrate (LOPRESSOR) 25 MG tablet Take 25 mg by mouth 2 (two) times daily.   Yes [provider]  Multiple Vitamin (MULTIVITAMIN WITH MINERALS) TABS tablet Take 1 tablet by mouth daily. 08/11/19  Yes Little Ishikawa, MD  nitroGLYCERIN (NITROSTAT) 0.4 MG SL tablet Place 1 tablet (0.4 mg total) under the tongue every 5 (five) minutes x 3 doses as needed for chest pain. Patient taking differently: Place 0.4 mg under the tongue every 5 (five) minutes as needed for chest pain (max 3 doses). 11/22/18  Yes Jettie Booze, MD  Omega-3 Fatty Acids (FISH OIL) 1000 MG  CAPS Take 1,000 mg by mouth  daily.   Yes [provider]  potassium chloride (KLOR-CON) 10 MEQ tablet Take 2 tablets (20 mEq total) by mouth daily. 02/20/20  Yes Regalado, Belkys A, MD  sulfamethoxazole-trimethoprim (BACTRIM DS) 800-160 MG tablet Take 1 tablet by mouth 2 (two) times daily.   Yes [provider]  vitamin B-12 100 MCG tablet Take 1 tablet (100 mcg total) by mouth daily. Patient taking differently: Take 100 mcg by mouth in the morning. 02/21/20  Yes Regalado, Belkys A, MD  metoprolol succinate (TOPROL-XL) 25 MG 24 hr tablet TAKE 1 TABLET (25 MG TOTAL) BY MOUTH 2 (TWO) TIMES DAILY WITH A MEAL. Patient not taking: Reported on 02/24/2020 12/20/19   Jettie Booze, MD   No Known Allergies Review of Systems  Constitutional: Positive for fatigue. Negative for appetite change.  Respiratory: Positive for shortness of breath.   Neurological: Positive for weakness.  All other systems reviewed and are negative.   Physical Exam Vitals and nursing note reviewed.  Constitutional:      General: He is not in acute distress.    Appearance: He is ill-appearing.  Pulmonary:     Effort: No respiratory distress.  Skin:    General: Skin is warm and dry.  Neurological:     Mental Status: He is alert. He is disoriented and confused.     Motor: Weakness present.  Psychiatric:        Attention and Perception: Attention normal.        Behavior: Behavior is cooperative.        Cognition and Memory: Cognition is impaired. Memory is impaired.     Vital Signs: BP (!) 129/98   Pulse (!) 110   Temp 97.6 F (36.4 C) (Oral)   Resp 18   Ht $R'5\' 9"'hZ$  (1.753 m)   Wt 83 kg   SpO2 100%   BMI 27.02 kg/m      Pain Score: 0-No pain   SpO2: SpO2: 100 % O2 Device:SpO2: 100 % O2 Flow Rate: .O2 Flow Rate (L/min): 2 L/min  IO: Intake/output summary: No intake or output data in the 24 hours ending 02/24/20 1626  LBM:   Baseline Weight: Weight: 83 kg Most recent weight: Weight:  83 kg     Palliative Assessment/Data: PPS 50-60%     Time In: 1645 Time Out: 1805 Time Total: 80 minutes  Greater than 50%  of this time was spent counseling and coordinating care related to the above assessment and plan.  Signed by: Lin Landsman, NP   Please contact Palliative Medicine Team phone at [phone] for questions and concerns.  For individual provider: See Shea Evans

## 2020-02-24 NOTE — Progress Notes (Signed)
Patient currently on 6L nasal cannula, BIPAP not needed at this time. RN informed to call RT place BIPAP if needed.

## 2020-02-24 NOTE — Progress Notes (Signed)
Patient took himself off of the BiPAP and is hollering for pepsi

## 2020-02-24 NOTE — ED Provider Notes (Signed)
[name]ERGENCY DEPARTMENT Provider Note   CSN: [id number] Arrival date & time: 02/24/20  1115     History Chief Complaint  Patient presents with  . Leg Swelling  . Shortness of Breath    Brian Brown is a 87 y.o. male presenting for evaluation of shortness of breath and leg swelling.  Level 5 caveat due to dementia.  Per triage note, patient has had a SNF and EMS was called out due to shortness of breath.  There were reports of sats of 45% on room air.  Patient does not normally wear oxygen.  Patient states he is feeling short of breath.  Denies chest pain.  Reports cough.  Additional history obtained from chart review.  Patient was admitted 9 days ago for heart failure, at that time he was living at home by himself and he was discharged to a SNF.  Per chart review, history of CAD, CHF, CKD, hypertension, PAF no longer on blood thinners due to brain bleed, prostate cancer status post surgery, sleep apnea does not wear CPAP, stroke with residual right leg weakness, dementia.  HPI     Past Medical History:  Diagnosis Date  . Arthritis    degenerative joint disease  . Bleeding stomach ulcer 25 yrs ago  . CAD (coronary artery disease)    a. 10/2018 NSTEMI/Cath: LM nl, LAD 60p/m, 30d, D1 90, LCX 50ost/p, OM2 90 thrombotic, RCA 30p, 20d-->med rx.  . CHF (congestive heart failure) ([place])   . CKD (chronic kidney disease), stage II   . Hepatitis 1981   does not know what kind;denies jaundice  . HFrEF (heart failure with reduced ejection fraction) ([place])    a. 08/2019 Echo: EF 21%.  Marland Kitchen Hypertension   . Ischemic cardiomyopathy    a. 02/2018 Echo: EF 60-65%; b. 10/2018 Echo: EF 45-50%; c. 08/2019 Echo: EF 21%, mild LVH. RVSP 41.70mmHg. Mod BAE. Mild MR. Triv AI.  . Mild dilation of ascending aorta ([place])   . PAF (paroxysmal atrial fibrillation) ([place])    a. 10/2018 noted on Linq-->eliquis initiated (CHA2DS2VASc = 7) however d/c'd in 08/2019 2/2 cerebral bleed.   . Prostate cancer [facility]) 2002   prostate, s/p surgery  . Sleep apnea 2005   does not wear a Cpap  . Stroke [facility])    a. 2003 mild residual right leg wkns; b. 06/2261 embolic stroke, later found to have PAF.  Marland Kitchen Wears dentures    full top  . Wears glasses     Patient Active Problem List   Diagnosis Date Noted  . CHF (congestive heart failure) ([place]) 02/15/2020  . Prolonged QT interval 02/15/2020  . Dementia ([place]) 02/15/2020  . Cellulitis of right leg 02/15/2020  . Goals of care, counseling/discussion   . Palliative care by specialist   . Persistent atrial fibrillation ([place])   . Coronary artery disease   . Acute systolic heart failure ([place]) 08/31/2019  . Acute on chronic systolic CHF (congestive heart failure) ([place]) 08/31/2019  . AMS (altered mental status) 08/31/2019  . Acute respiratory failure with hypoxia ([place]) 08/31/2019  . Lactic acidosis 08/31/2019  . History of intracerebral hemorrhage without residual deficit 08/31/2019  . Protein-calorie malnutrition, severe 08/08/2019  . Intracerebral hemorrhage ([place]) 08/07/2019  . Acute CHF (congestive heart failure) ([place]) 08/06/2019  . AKI (acute kidney injury) ([place]) 08/06/2019  . Elevated troponin 08/06/2019  . Transaminitis 08/06/2019  . Closed rib fracture 08/06/2019  . History of CVA (cerebrovascular accident) 08/06/2019  . PAF (paroxysmal  atrial fibrillation) ([place]) 10/20/2018  . NSTEMI (non-ST elevated myocardial infarction) ([place])   . Acute CVA (cerebrovascular accident) ([place]) 02/14/2018  . TIA (transient ischemic attack) 02/13/2018  . Hepatitis C reactive 02/06/2011  . Hyponatremia 02/06/2011  . Postoperative anemia due to acute blood loss 02/05/2011  . Hypokalemia 02/05/2011  . Osteoarthritis of hip 01/27/2011  . Hypertension 01/27/2011  . Sleep apnea 01/27/2011  . Stroke/cerebrovascular accident ([place]) 01/27/2011  . Cancer of prostate ([place]) 01/27/2011    Past Surgical History:  Procedure Laterality Date  . BUBBLE  STUDY  09/16/2018   Procedure: BUBBLE STUDY;  Surgeon: Lelon Perla, MD;  Location: [facility];  Service: Cardiovascular;;  . CARDIAC CATHETERIZATION  10/19/2018  . CARPAL TUNNEL RELEASE Left 03/29/2013   Procedure: LEFT CARPAL TUNNEL RELEASE;  Surgeon: Wynonia Sours, MD;  Location: [facility];  Service: Orthopedics;  Laterality: Left;  ANESTHESIA: IV REGIONAL FAB  . CARPAL TUNNEL RELEASE Right 03/06/2014   Procedure: RIGHT CARPAL TUNNEL RELEASE;  Surgeon: Daryll Brod, MD;  Location: [facility];  Service: Orthopedics;  Laterality: Right;  . COLONOSCOPY    . HERNIA REPAIR  2009   rt inguinal hernia  . JOINT REPLACEMENT  12/2001   rt hip ;[facility] CATH AND CORONARY ANGIOGRAPHY N/A 10/19/2018   Procedure: LEFT HEART CATH AND CORONARY ANGIOGRAPHY;  Surgeon: Wellington Hampshire, MD;  Location: [facility] CV LAB;  Service: Cardiovascular;  Laterality: N/A;  . LOOP RECORDER INSERTION N/A 09/16/2018   Procedure: LOOP RECORDER INSERTION;  Surgeon: Constance Haw, MD;  Location: [facility];  Service: Cardiovascular;  Laterality: N/A;  . PROSTATECTOMY  2002  . SHOULDER ARTHROSCOPY W/ ROTATOR CUFF REPAIR  2014   lt-dr whitfield  . TEE WITHOUT CARDIOVERSION N/A 09/16/2018   Procedure: TRANSESOPHAGEAL ECHOCARDIOGRAM (TEE);  Surgeon: Lelon Perla, MD;  Location: [facility] ENDOSCOPY;  Service: Cardiovascular;  Laterality: N/A;  . TOTAL HIP ARTHROPLASTY  02/03/2011   Procedure: TOTAL HIP ARTHROPLASTY;  Surgeon: Garald Balding, MD;  Location: [facility];  Service: Orthopedics;  Laterality: Left;  . TRIGGER FINGER RELEASE Left 03/29/2013   Procedure: LEFT MIDDLE/SMALL FINGER RELEASE A-1 PULLEY AND LEFT RING FINGER;  Surgeon: Wynonia Sours, MD;  Location: [facility];  Service: Orthopedics;  Laterality: Left;  . TRIGGER FINGER RELEASE Right 03/06/2014   Procedure: RIGHT MIDDLE FINGER,RIGHT RINGER FINGER;  Surgeon: Daryll Brod, MD;  Location:  [facility];  Service: Orthopedics;  Laterality: Right;       Family History  Problem Relation Age of Onset  . Heart disease Mother   . Heart disease Father     Social History   Tobacco Use  . Smoking status: Former Smoker    Packs/day: 1.00    Years: 30.00    Pack years: 30.00    Types: Cigarettes    Quit date: 01/27/2008    Years since quitting: 12.0  . Smokeless tobacco: Never Used  Vaping Use  . Vaping Use: Never used  Substance Use Topics  . Alcohol use: No  . Drug use: No    Home Medications Prior to Admission medications   Medication Sig Start Date End Date Taking? Authorizing Provider  acetaminophen (TYLENOL) 325 MG tablet Take 650 mg by mouth every 6 (six) hours as needed for mild pain or headache.   Yes [provider]  apixaban (ELIQUIS) 2.5 MG TABS tablet Take 1 tablet (2.5 mg total) by mouth  2 (two) times daily. 02/20/20  Yes Regalado, Belkys A, MD  atorvastatin (LIPITOR) 40 MG tablet TAKE 1 TABLET EVERY DAY Patient taking differently: Take 40 mg by mouth in the morning. 07/24/19  Yes Jettie Booze, MD  famotidine (PEPCID) 20 MG tablet Take 1 tablet (20 mg total) by mouth at bedtime. 08/11/19  Yes Little Ishikawa, MD  furosemide (LASIX) 40 MG tablet Take 1 tablet (40 mg total) by mouth 2 (two) times daily. 02/05/20  Yes Jettie Booze, MD  latanoprost (XALATAN) 0.005 % ophthalmic solution Place 1 drop into both eyes at bedtime.   Yes [provider]  metoprolol tartrate (LOPRESSOR) 25 MG tablet Take 25 mg by mouth 2 (two) times daily.   Yes [provider]  Multiple Vitamin (MULTIVITAMIN WITH MINERALS) TABS tablet Take 1 tablet by mouth daily. 08/11/19  Yes Little Ishikawa, MD  nitroGLYCERIN (NITROSTAT) 0.4 MG SL tablet Place 1 tablet (0.4 mg total) under the tongue every 5 (five) minutes x 3 doses as needed for chest pain. Patient taking differently: Place 0.4 mg under the tongue every 5 (five) minutes as  needed for chest pain (max 3 doses). 11/22/18  Yes Jettie Booze, MD  Omega-3 Fatty Acids (FISH OIL) 1000 MG CAPS Take 1,000 mg by mouth daily.   Yes [provider]  potassium chloride (KLOR-CON) 10 MEQ tablet Take 2 tablets (20 mEq total) by mouth daily. 02/20/20  Yes Regalado, Belkys A, MD  sulfamethoxazole-trimethoprim (BACTRIM DS) 800-160 MG tablet Take 1 tablet by mouth 2 (two) times daily.   Yes [provider]  vitamin B-12 100 MCG tablet Take 1 tablet (100 mcg total) by mouth daily. Patient taking differently: Take 100 mcg by mouth in the morning. 02/21/20  Yes Regalado, Belkys A, MD  metoprolol succinate (TOPROL-XL) 25 MG 24 hr tablet TAKE 1 TABLET (25 MG TOTAL) BY MOUTH 2 (TWO) TIMES DAILY WITH A MEAL. Patient not taking: Reported on 02/24/2020 12/20/19   Jettie Booze, MD    Allergies    Patient has no known allergies.  Review of Systems   Review of Systems  Unable to perform ROS: Mental status change  Respiratory: Positive for shortness of breath.   Cardiovascular: Positive for leg swelling.    Physical Exam Updated Vital Signs BP (!) 129/98   Pulse (!) 110   Temp 97.6 F (36.4 C) (Oral)   Resp 18   Ht 5\' 9"  (1.753 m)   Wt 83 kg   SpO2 100%   BMI 27.02 kg/m   Physical Exam Vitals and nursing note reviewed.  Constitutional:      Appearance: He is well-developed and well-nourished.     Comments: Appears chronically ill  HENT:     Head: Normocephalic and atraumatic.  Eyes:     Extraocular Movements: EOM normal.     Conjunctiva/sclera: Conjunctivae normal.     Pupils: Pupils are equal, round, and reactive to light.  Cardiovascular:     Rate and Rhythm: Normal rate and regular rhythm.     Pulses: Normal pulses and intact distal pulses.  Pulmonary:     Effort: Tachypnea and accessory muscle usage present. No respiratory distress.     Breath sounds: Rhonchi present. No wheezing.     Comments: Tachypneic, rhonchi noted throughout  lungs.  On 2 L of oxygen, sats in the low 90s.  Increased work of breathing with self-PEEP Abdominal:     General: There is no distension.  Palpations: Abdomen is soft. There is no mass.     Tenderness: There is no abdominal tenderness. There is no guarding or rebound.  Musculoskeletal:        General: Normal range of motion.     Cervical back: Normal range of motion and neck supple.     Right lower leg: Edema present.     Left lower leg: Edema present.  Skin:    General: Skin is warm and dry.     Capillary Refill: Capillary refill takes less than 2 seconds.  Neurological:     Mental Status: He is alert. Mental status is at baseline.  Psychiatric:        Mood and Affect: Mood and affect normal.     ED Results / Procedures / Treatments   Labs (all labs ordered are listed, but only abnormal results are displayed) Labs Reviewed  BASIC METABOLIC PANEL - Abnormal; Notable for the following components:      Result Value   BUN 38 (*)    Creatinine, Ser 2.19 (*)    GFR, Estimated 29 (*)    All other components within normal limits  BRAIN NATRIURETIC PEPTIDE - Abnormal; Notable for the following components:   B Natriuretic Peptide 2,617.0 (*)    All other components within normal limits  I-STAT ARTERIAL BLOOD GAS, ED - Abnormal; Notable for the following components:   pH, Arterial 7.513 (*)    pO2, Arterial 200 (*)    Acid-Base Excess 4.0 (*)    All other components within normal limits  RESP PANEL BY RT-PCR (FLU A&B, COVID) ARPGX2  CBC    EKG EKG Interpretation  Date/Time:  Saturday February 24 2020 11:17:18 EST Ventricular Rate:  87 PR Interval:    QRS Duration: 72 QT Interval:  348 QTC Calculation: 418 R Axis:   -53 Text Interpretation: Atrial fibrillation Left axis deviation Low voltage QRS Nonspecific T wave abnormality Abnormal ECG Confirmed by Thamas Jaegers ([id number]) on 02/24/2020 11:22:15 AM   Radiology DG Chest 2 View  Result Date: 02/24/2020 CLINICAL DATA:   Shortness of breath. EXAM: CHEST - 2 VIEW COMPARISON:  02/15/2020. FINDINGS: 1148 hours. The cardio pericardial silhouette is enlarged. There is pulmonary vascular congestion without overt pulmonary edema. Increasing bibasilar collapse/consolidation with small bilateral pleural effusions. Bones are diffusely demineralized. IMPRESSION: Cardiomegaly with vascular congestion. Bibasilar atelectasis/infiltrate with small bilateral pleural effusions. Electronically Signed   By: Misty Stanley M.D.   On: 02/24/2020 12:09    Procedures Procedures   Medications Ordered in ED Medications  furosemide (LASIX) injection 40 mg (40 mg Intravenous Given 02/24/20 1253)    ED Course  I have reviewed the triage vital signs and the nursing notes.  Pertinent labs & imaging results that were available during my care of the patient were reviewed by me and considered in my medical decision making (see chart for details).  Clinical Course as of 02/24/20 1456  Sat Feb 24, 2020  1224 I was directly involved in this patients medical care.  [JH]    Clinical Course User Index [JH] [place], Greggory Brandy, MD   MDM Rules/Calculators/A&P                          Patient presenting for evaluation of shortness of breath with reported hypoxia.  On exam, patient appears chronically ill.  He does have increased work of breathing and is requiring oxygen when he is normally on room air.  While his  sats are currently stable on oxygen, I am concerned about his increased work of breathing.  He would likely benefit from BiPAP as symptoms are likely due to acute on chronic heart failure.  We will also give IV Lasix.  Labs and chest x-ray ordered.  Case discussed with attending, Dr. Almyra Free evaluated the patient.  Chest x-ray viewed interpreted by me, shows bilateral opacities consistent with edema.  Labs without leukocytosis, patient without fever, as such, doubt pneumonia.  Creatinine elevated from baseline, BNP elevated from discharge.  On  repeat evaluation, patient now hypoxic on 2 L via nasal cannula, will start on BiPAP.  Patient evaluated on BiPAP, lung sounds improved.  He is more alert.  However he is becoming agitated about the mask.  He has been on BiPAP for 30 minutes.  Will obtain ABG per hospitalist request and discontinue BiPAP and see how his respiratory status fares.  Discussed with Dr. Earnest Conroy from [facility] hospitalist service, pt to be admitted.   Final Clinical Impression(s) / ED Diagnoses Final diagnoses:  Acute on chronic heart failure, unspecified heart failure type [facility])  Hypoxia    Rx / DC Orders ED Discharge Orders    None       Franchot Heidelberg, PA-C 02/24/20 1456    Luna Fuse, MD 02/24/20 [phone]

## 2020-02-24 NOTE — ED Triage Notes (Signed)
EMS stated, pick him up for low O2 its 100% the facility stated, it was 45%

## 2020-02-24 NOTE — H&P (Addendum)
History and Physical    DOA: 02/24/2020  PCP: Seward Carol, MD  Patient coming from: SNF  Chief Complaint: Dyspneic and hypoxic at SNF  HPI: Brian Brown is a 87 y.o. male with history h/o CAD, NSTEMI, CHF with EF 20%, paroxysmal A. fib on Eliquis, CVA with residual right-sided weakness, history of ICH, HTN who was recently admitted to this [facility] from 2/10-2/15/2022 and discharged to SNF, presents with worsening dyspnea and noted to be hypoxic at the facility.  Patient has dementia with limited ability to provide history.  Per EDP, patient hypoxic with O2 sats 76% on room air on arrival here.  Patient initially placed on 2 L O2 and was titrated up to 5 L with which his O2 sat was in low 90s but still tachypneic requiring BiPAP therapy for about 30 minutes.  Patient was discharged on Lasix 40 mg twice daily and apparently has not missed any recent doses per EDP d/w staff.  Patient taken off BiPAP therapy few minutes before my visit, and entered the room, he is noted to be sitting upright and comfortable, not on any O2 (nasal cannula on bedside table) and saturating 86%.  Placed back on nasal cannula (with wall unit showing 5 L)-saturating 94%.  Patient states he has 6 children, was able to name his 3 daughters.  Disoriented to time, acknowledges that he lives in a facility now. ED work-up: Tachypneic and hypoxic on arrival as described above.  BNP elevated 1925->2617.  BMP shows worsening creatinine from 1.4->2.1.  Chest x-ray with pulmonary vascular congestion, small pleural effusions and underlying atelectasis.  EKG shows A. fib, no acute ST-T changes.     Review of Systems: As per HPI otherwise 10 point review of systems negative.    Past Medical History:  Diagnosis Date  . Arthritis    degenerative joint disease  . Bleeding stomach ulcer 25 yrs ago  . CAD (coronary artery disease)    a. 10/2018 NSTEMI/Cath: LM nl, LAD 60p/m, 30d, D1 90, LCX 50ost/p, OM2 90 thrombotic, RCA  30p, 20d-->med rx.  . CHF (congestive heart failure) ([place])   . CKD (chronic kidney disease), stage II   . Hepatitis 1981   does not know what kind;denies jaundice  . HFrEF (heart failure with reduced ejection fraction) ([place])    a. 08/2019 Echo: EF 21%.  Marland Kitchen Hypertension   . Ischemic cardiomyopathy    a. 02/2018 Echo: EF 60-65%; b. 10/2018 Echo: EF 45-50%; c. 08/2019 Echo: EF 21%, mild LVH. RVSP 41.45mmHg. Mod BAE. Mild MR. Triv AI.  . Mild dilation of ascending aorta ([place])   . PAF (paroxysmal atrial fibrillation) ([place])    a. 10/2018 noted on Linq-->eliquis initiated (CHA2DS2VASc = 7) however d/c'd in 08/2019 2/2 cerebral bleed.  . Prostate cancer [facility]) 2002   prostate, s/p surgery  . Sleep apnea 2005   does not wear a Cpap  . Stroke [facility])    a. 2003 mild residual right leg wkns; b. 08/2503 embolic stroke, later found to have PAF.  Marland Kitchen Wears dentures    full top  . Wears glasses     Past Surgical History:  Procedure Laterality Date  . BUBBLE STUDY  09/16/2018   Procedure: BUBBLE STUDY;  Surgeon: Lelon Perla, MD;  Location: [place];  Service: Cardiovascular;;  . CARDIAC CATHETERIZATION  10/19/2018  . CARPAL TUNNEL RELEASE Left 03/29/2013   Procedure: LEFT CARPAL TUNNEL RELEASE;  Surgeon: Wynonia Sours, MD;  Location: [place];  Service: Orthopedics;  Laterality: Left;  ANESTHESIA: IV REGIONAL FAB  . CARPAL TUNNEL RELEASE Right 03/06/2014   Procedure: RIGHT CARPAL TUNNEL RELEASE;  Surgeon: Daryll Brod, MD;  Location: [place];  Service: Orthopedics;  Laterality: Right;  . COLONOSCOPY    . HERNIA REPAIR  2009   rt inguinal hernia  . JOINT REPLACEMENT  12/2001   rt hip ;[place] CATH AND CORONARY ANGIOGRAPHY N/A 10/19/2018   Procedure: LEFT HEART CATH AND CORONARY ANGIOGRAPHY;  Surgeon: Wellington Hampshire, MD;  Location: [place] CV LAB;  Service: Cardiovascular;  Laterality: N/A;  . LOOP RECORDER INSERTION N/A 09/16/2018    Procedure: LOOP RECORDER INSERTION;  Surgeon: Constance Haw, MD;  Location: [place] CV LAB;  Service: Cardiovascular;  Laterality: N/A;  . PROSTATECTOMY  2002  . SHOULDER ARTHROSCOPY W/ ROTATOR CUFF REPAIR  2014   lt-dr whitfield  . TEE WITHOUT CARDIOVERSION N/A 09/16/2018   Procedure: TRANSESOPHAGEAL ECHOCARDIOGRAM (TEE);  Surgeon: Lelon Perla, MD;  Location: [facility] ENDOSCOPY;  Service: Cardiovascular;  Laterality: N/A;  . TOTAL HIP ARTHROPLASTY  02/03/2011   Procedure: TOTAL HIP ARTHROPLASTY;  Surgeon: Garald Balding, MD;  Location: [place];  Service: Orthopedics;  Laterality: Left;  . TRIGGER FINGER RELEASE Left 03/29/2013   Procedure: LEFT MIDDLE/SMALL FINGER RELEASE A-1 PULLEY AND LEFT RING FINGER;  Surgeon: Wynonia Sours, MD;  Location: [place];  Service: Orthopedics;  Laterality: Left;  . TRIGGER FINGER RELEASE Right 03/06/2014   Procedure: RIGHT MIDDLE FINGER,RIGHT RINGER FINGER;  Surgeon: Daryll Brod, MD;  Location: [place];  Service: Orthopedics;  Laterality: Right;    Social history:  reports that he quit smoking about 12 years ago. His smoking use included cigarettes. He has a 30.00 pack-year smoking history. He has never used smokeless tobacco. He reports that he does not drink alcohol and does not use drugs.   No Known Allergies  Family History  Problem Relation Age of Onset  . Heart disease Mother   . Heart disease Father       Prior to Admission medications   Medication Sig Start Date End Date Taking? Authorizing Provider  acetaminophen (TYLENOL) 325 MG tablet Take 650 mg by mouth every 6 (six) hours as needed for mild pain or headache.   Yes [provider]  apixaban (ELIQUIS) 2.5 MG TABS tablet Take 1 tablet (2.5 mg total) by mouth 2 (two) times daily. 02/20/20  Yes Regalado, Belkys A, MD  atorvastatin (LIPITOR) 40 MG tablet TAKE 1 TABLET EVERY DAY Patient taking differently: Take 40 mg by mouth in the morning.  07/24/19  Yes Jettie Booze, MD  famotidine (PEPCID) 20 MG tablet Take 1 tablet (20 mg total) by mouth at bedtime. 08/11/19  Yes Little Ishikawa, MD  furosemide (LASIX) 40 MG tablet Take 1 tablet (40 mg total) by mouth 2 (two) times daily. 02/05/20  Yes Jettie Booze, MD  latanoprost (XALATAN) 0.005 % ophthalmic solution Place 1 drop into both eyes at bedtime.   Yes [provider]  metoprolol tartrate (LOPRESSOR) 25 MG tablet Take 25 mg by mouth 2 (two) times daily.   Yes [provider]  Multiple Vitamin (MULTIVITAMIN WITH MINERALS) TABS tablet Take 1 tablet by mouth daily. 08/11/19  Yes Little Ishikawa, MD  nitroGLYCERIN (NITROSTAT) 0.4 MG SL tablet Place 1 tablet (0.4 mg total) under the tongue every 5 (five) minutes x 3 doses as needed for chest  pain. Patient taking differently: Place 0.4 mg under the tongue every 5 (five) minutes as needed for chest pain (max 3 doses). 11/22/18  Yes Jettie Booze, MD  Omega-3 Fatty Acids (FISH OIL) 1000 MG CAPS Take 1,000 mg by mouth daily.   Yes [provider]  potassium chloride (KLOR-CON) 10 MEQ tablet Take 2 tablets (20 mEq total) by mouth daily. 02/20/20  Yes Regalado, Belkys A, MD  sulfamethoxazole-trimethoprim (BACTRIM DS) 800-160 MG tablet Take 1 tablet by mouth 2 (two) times daily.   Yes [provider]  vitamin B-12 100 MCG tablet Take 1 tablet (100 mcg total) by mouth daily. Patient taking differently: Take 100 mcg by mouth in the morning. 02/21/20  Yes Regalado, Belkys A, MD  metoprolol succinate (TOPROL-XL) 25 MG 24 hr tablet TAKE 1 TABLET (25 MG TOTAL) BY MOUTH 2 (TWO) TIMES DAILY WITH A MEAL. Patient not taking: Reported on 02/24/2020 12/20/19   Jettie Booze, MD    Physical Exam: Vitals:   02/24/20 1402 02/24/20 1405 02/24/20 1413 02/24/20 1430  BP:  (!) 154/108  (!) 129/98  Pulse: 69 90  (!) 110  Resp: (!) 0 (!) 24  18  Temp:      TempSrc:      SpO2: (!) 80% 100%     Weight:   83 kg   Height:   5\' 9"  (1.753 m)     Constitutional: NAD, calm, comfortable Eyes: PERRL, lids and conjunctivae normal ENMT: Mucous membranes are moist,poor dentition.  Neck: normal, supple, no masses, no thyromegaly Respiratory: clear to auscultation bilaterally although decreased breath sounds at bases, no wheezing, no crackles.  Mild increased respiratory effort while talking full sentences.  Cardiovascular: Irregular rhythm, mild diastolic murmur at apex, no rubs / gallops.  1+ pitting bilateral lower extremity edema. 1+ pedal pulses. Abdomen: no tenderness, no masses palpated. No hepatosplenomegaly. Bowel sounds positive.  Musculoskeletal: no clubbing / cyanosis. No joint deformity upper and lower extremities. Good ROM, no contractures. Normal muscle tone.  Neurologic: Oriented x2, old right hemiparesis.  CN 2-12 grossly intact.   Psychiatric: Impaired judgment and insight. Alert and oriented x 2. Normal mood, unhappy about staying in the hospital, tangential/unintelligible talk at times.  SKIN/catheters: Superficial old wounds, chronic stasis dermatitis bilateral lower extremities Labs on Admission: I have personally reviewed following labs and imaging studies  CBC: Recent Labs  Lab 02/24/20 1121 02/24/20 1452  WBC 7.7  --   HGB 14.3 13.9  HCT 44.1 41.0  MCV 91.1  --   PLT 200  --    Basic Metabolic Panel: Recent Labs  Lab 02/18/20 0358 02/19/20 0229 02/20/20 0645 02/24/20 1121 02/24/20 1452  NA 139 139 141 141 140  K 3.5 3.4* 3.6 4.5 4.1  CL 102 103 102 103  --   CO2 27 27 29 25   --   GLUCOSE 98 92 95 95  --   BUN 26* 28* 30* 38*  --   CREATININE 1.49* 1.59* 1.46* 2.19*  --   CALCIUM 8.7* 8.6* 9.1 9.6  --    GFR: Estimated Creatinine Clearance: 24.7 mL/min (A) (by C-G formula based on SCr of 2.19 mg/dL (H)). Recent Labs  Lab 02/24/20 1121  WBC 7.7   Liver Function Tests: No results for input(s): AST, ALT, ALKPHOS, BILITOT, PROT, ALBUMIN in the  last 168 hours. No results for input(s): LIPASE, AMYLASE in the last 168 hours. No results for input(s): AMMONIA in the last 168 hours. Coagulation Profile: No  results for input(s): INR, PROTIME in the last 168 hours. Cardiac Enzymes: No results for input(s): CKTOTAL, CKMB, CKMBINDEX, TROPONINI in the last 168 hours. BNP (last 3 results) No results for input(s): PROBNP in the last 8760 hours. HbA1C: No results for input(s): HGBA1C in the last 72 hours. CBG: No results for input(s): GLUCAP in the last 168 hours. Lipid Profile: No results for input(s): CHOL, HDL, LDLCALC, TRIG, CHOLHDL, LDLDIRECT in the last 72 hours. Thyroid Function Tests: No results for input(s): TSH, T4TOTAL, FREET4, T3FREE, THYROIDAB in the last 72 hours. Anemia Panel: No results for input(s): VITAMINB12, FOLATE, FERRITIN, TIBC, IRON, RETICCTPCT in the last 72 hours. Urine analysis:    Component Value Date/Time   COLORURINE AMBER (A) 08/31/2019 1322   APPEARANCEUR HAZY (A) 08/31/2019 1322   LABSPEC 1.025 08/31/2019 1322   PHURINE 5.0 08/31/2019 1322   GLUCOSEU NEGATIVE 08/31/2019 1322   HGBUR SMALL (A) 08/31/2019 1322   BILIRUBINUR NEGATIVE 08/31/2019 1322   KETONESUR NEGATIVE 08/31/2019 1322   PROTEINUR 100 (A) 08/31/2019 1322   UROBILINOGEN 0.2 01/27/2011 1233   NITRITE NEGATIVE 08/31/2019 1322   LEUKOCYTESUR NEGATIVE 08/31/2019 1322    Radiological Exams on Admission: Personally reviewed  DG Chest 2 View  Result Date: 02/24/2020 CLINICAL DATA:  Shortness of breath. EXAM: CHEST - 2 VIEW COMPARISON:  02/15/2020. FINDINGS: 1148 hours. The cardio pericardial silhouette is enlarged. There is pulmonary vascular congestion without overt pulmonary edema. Increasing bibasilar collapse/consolidation with small bilateral pleural effusions. Bones are diffusely demineralized. IMPRESSION: Cardiomegaly with vascular congestion. Bibasilar atelectasis/infiltrate with small bilateral pleural effusions. Electronically  Signed   By: Misty Stanley M.D.   On: 02/24/2020 12:09    EKG: Independently reviewed. Low voltage, A. fib, LAD and QTC 418 ms     Assessment and Plan:   Principal Problem:   Acute on chronic systolic CHF (congestive heart failure) (HCC) Active Problems:   Hypertension   Sleep apnea   Stroke/cerebrovascular accident [facility])   NSTEMI (non-ST elevated myocardial infarction) ([place])   PAF (paroxysmal atrial fibrillation) ([place])   AKI (acute kidney injury) ([place])   Intracerebral hemorrhage ([place])   Coronary artery disease   Dementia ([place])    1.  Acute on chronic systolic CHF: Patient's BNP and most recent admission was elevated up to 2983 and it improved to 1900 with IV Lasix twice daily-transition to p.o. 40 mg twice daily prior to discharge.  His echo on 02/16/2020 showed EF 20%.  He was supposed to follow-up cardiology regarding starting Entresto if in 1 renal function considered stable.  Now with worsening CHF again and BNP up to 2600.  Will admit with IV diuresis, close I's and O's.   2.  Acute on chronic hypoxic respiratory failure: Per last discharge summary, it appears that patient was supposed to be on 3 L O2 but unclear if he was actually using nasal cannula as he arrived here without any.  Requiring up to 5 L of O2 today and s/p brief BiPAP therapy in the ED-improved now and ABG satisfactory.  Continue to monitor closely and titrate O2 as needed.  3.  Acute on chronic CKD stage IIIb: Patient's baseline creatinine around 1.3-1.4.  Likely worsened in the setting of volume overload/congested kidney.  He did show improvement with IV diuretics in last admission, will monitor closely in this admission as well and adjust diuretics accordingly.  Check bladder scan to rule out retention (d/w bedside nurse).  4.  Paroxysmal atrial fibrillation: Resume beta-blockers and Eliquis.  5.  Hypertension/CAD/non-STEMI: Resume home medications.  EKG at baseline.  No acute ST-T changes.  No complaints of  chest pain.  Monitor blood pressure with IV diuresis.  6.  History of CVA/ICH: Continue statins and chronic anticoagulation.  7.  Dementia with memory deficits: Patient was living alone prior to last admission and discharged to SNF as he was unable to manage medications at home due to memory issues.  He presents today from SNF.  Resume home medications.  Delirium precautions.  Palliative care consult for goals of care discussion given advanced age, dementia, recurrent hospitalizations and multiple comorbidities.  DVT prophylaxis: On anticoagulation  COVID screen: Negative  Code Status: Full code. Health care proxy would be his daughters per record.  Brother visited him in the ED earlier today, not at bedside currently. Discussed in detail with daughter [name] and encouraged to discuss with other family members regarding care goals given overall poor prognosis.  She is aware of palliative care consulted but wishes to continue full code for now.  Patient/Family Communication: Discussed with patient and all questions answered to satisfaction.  Consults called: Heart failure team (paged [phone]) Admission status :I certify that at the point of admission it is my clinical judgment that the patient will require inpatient hospital care spanning beyond 2 midnights from the point of admission due to high intensity of service and high frequency of surveillance required.Inpatient status is judged to be reasonable and necessary in order to provide the required intensity of service to ensure the patient's safety. The patient's presenting symptoms, physical exam findings, and initial radiographic and laboratory data in the context of their chronic comorbidities is felt to place them at high risk for further clinical deterioration. The following factors support the patient status of inpatient : Recurrent CHF exacerbation with respiratory failure requiring IV diuresis and O2 titration, close monitoring of renal  function.     Guilford Shi MD [facility] Pager in [place]  If 7PM-7AM, please contact night-coverage [email or web address]   02/24/2020, 4:03 PM

## 2020-02-25 ENCOUNTER — Encounter (HOSPITAL_COMMUNITY): Payer: Self-pay | Admitting: Internal Medicine

## 2020-02-25 DIAGNOSIS — R5381 Other malaise: Secondary | ICD-10-CM

## 2020-02-25 DIAGNOSIS — N179 Acute kidney failure, unspecified: Secondary | ICD-10-CM | POA: Diagnosis not present

## 2020-02-25 DIAGNOSIS — I5023 Acute on chronic systolic (congestive) heart failure: Secondary | ICD-10-CM | POA: Diagnosis not present

## 2020-02-25 DIAGNOSIS — F0390 Unspecified dementia without behavioral disturbance: Secondary | ICD-10-CM | POA: Diagnosis not present

## 2020-02-25 DIAGNOSIS — Z515 Encounter for palliative care: Secondary | ICD-10-CM | POA: Diagnosis not present

## 2020-02-25 LAB — BASIC METABOLIC PANEL
Anion gap: 14 (ref 5–15)
BUN: 41 mg/dL — ABNORMAL HIGH (ref 8–23)
CO2: 28 mmol/L (ref 22–32)
Calcium: 9.3 mg/dL (ref 8.9–10.3)
Chloride: 101 mmol/L (ref 98–111)
Creatinine, Ser: 2.18 mg/dL — ABNORMAL HIGH (ref 0.61–1.24)
GFR, Estimated: 29 mL/min — ABNORMAL LOW (ref >60)
Glucose, Bld: 86 mg/dL (ref 70–99)
Potassium: 3.6 mmol/L (ref 3.5–5.1)
Sodium: 143 mmol/L (ref 135–145)

## 2020-02-25 LAB — GLUCOSE, CAPILLARY: Glucose-Capillary: 99 mg/dL (ref 70–99)

## 2020-02-25 MED ORDER — CHLORHEXIDINE GLUCONATE CLOTH 2 % EX PADS
6.0000 | MEDICATED_PAD | Freq: Every day | CUTANEOUS | Status: DC
  Administered 2020-02-25 – 2020-02-29 (×4): 6 via TOPICAL

## 2020-02-25 NOTE — Progress Notes (Signed)
Daily Progress Note   Patient Name: MIVAAN CORBITT       Date: 02/25/2020 DOB: 1932/11/11  Age: 87 y.o. MRN#: [id number] Attending Physician: Karie Kirks, DO Primary Care Physician: Seward Carol, MD Admit Date: 02/24/2020  Reason for Consultation/Follow-up: Establishing goals of care  Subjective: Chart review performed. Received report from primary RN - no acute concerns. RN states patient remains confused and agitated at times. Patient refuses to eat or drink with staff - was accepting of small amount of liquid with daughter. RN reports his respiratory status has stabilized.  Went to visit patient at bedside - daughter/Laura was present. Patient was lying in bed awake, alert, oriented x2 (self and year), and unable to participate in complex medical decisions/conversation. His speech remains slurred/mumbled and hard to understand. He is wearing mitts and is on 2L O2 [place]. Patient is restless during visit - attempts to get out of bed multiple times. At one point patient was speaking about a deceased family member being present.  I spoke with [name] in person and patient's other two daughters were on the phone with [name] listening to conversation. [name] tells me that patient has not eaten much today - she feels it is due to dry mouth. I encouraged the use of mouth swabs to moisten mucosa if patient was not willing to drink or eat (brought mouth swabs to room for use). We again reviewed the natural and expected trajectory of dementia in context of what signs/symptoms patient has been having. We discussed that sometimes with acute events, such as CHF exacerbations or COVID infections, it can cause dementia to progress more quickly. [name] expressed understanding and agrees she has noticed a decline in the  patient but states he usually has a good appeite. I encouraged, when able, that family try and assist with feedings and sometimes patient's respond better to family. We again briefly discussed CHF as a progressive disease - family are still waiting for input from heart failure team as concern is he was not receiving his correct medications at rehab facility.   [name] and her sisters wanted to know what medications patient is currently receiving for his heart - reviewed information as available per progress notes and active orders in detail. Family inquire why patient is not currently taking Delene Loll - family state he was started on  this medication previously. I explained attending and/or heart failure team could speak to this as per notes it seemed he had not yet been started on it.   All questions and concerns addressed. Encouraged to call with questions and/or concerns. PMT number previously provided.   Length of Stay: 1  Current Medications: Scheduled Meds:  . apixaban  2.5 mg Oral BID  . atorvastatin  40 mg Oral Daily  . Chlorhexidine Gluconate Cloth  6 each Topical Daily  . famotidine  20 mg Oral QHS  . furosemide  40 mg Intravenous Q12H  . latanoprost  1 drop Both Eyes QHS  . metoprolol tartrate  25 mg Oral BID  . omega-3 acid ethyl esters  1 g Oral Daily  . potassium chloride  20 mEq Oral Daily  . sodium chloride flush  3 mL Intravenous Q12H  . cyanocobalamin  100 mcg Oral Daily    Continuous Infusions:   PRN Meds: acetaminophen, hydrALAZINE, nitroGLYCERIN, OLANZapine zydis, sodium chloride flush  Physical Exam Vitals and nursing note reviewed.  Constitutional:      General: He is not in acute distress.    Appearance: He is ill-appearing.  Pulmonary:     Effort: No respiratory distress.  Skin:    General: Skin is warm and dry.  Neurological:     Mental Status: He is alert. He is disoriented and confused.     Motor: Weakness present.  Psychiatric:        Attention and  Perception: Attention normal.        Behavior: Behavior is cooperative.        Cognition and Memory: Cognition is impaired. Memory is impaired.             Vital Signs: BP (!) 120/96 (BP Location: Right Arm)   Pulse 95   Temp 97.6 F (36.4 C) (Axillary)   Resp 18   Ht 5\' 9"  (1.753 m)   Wt 81.1 kg   SpO2 98%   BMI 26.40 kg/m  SpO2: SpO2: 98 % O2 Device: O2 Device: Nasal Cannula O2 Flow Rate: O2 Flow Rate (L/min): 4 L/min  Intake/output summary:   Intake/Output Summary (Last 24 hours) at 02/25/2020 1808 Last data filed at 02/25/2020 1729 Gross per 24 hour  Intake 531 ml  Output 16700 ml  Net -16169 ml   LBM: Last BM Date: 02/25/20 Baseline Weight: Weight: 83 kg Most recent weight: Weight: 81.1 kg       Palliative Assessment/Data: 50-60%      Patient Active Problem List   Diagnosis Date Noted  . CHF exacerbation ([place]) 02/24/2020  . CHF (congestive heart failure) ([place]) 02/15/2020  . Prolonged QT interval 02/15/2020  . Dementia ([place]) 02/15/2020  . Cellulitis of right leg 02/15/2020  . Goals of care, counseling/discussion   . Palliative care by specialist   . Persistent atrial fibrillation ([place])   . Coronary artery disease   . Acute systolic heart failure ([place]) 08/31/2019  . Acute on chronic systolic CHF (congestive heart failure) ([place]) 08/31/2019  . AMS (altered mental status) 08/31/2019  . Acute respiratory failure with hypoxia ([place]) 08/31/2019  . Lactic acidosis 08/31/2019  . History of intracerebral hemorrhage without residual deficit 08/31/2019  . Protein-calorie malnutrition, severe 08/08/2019  . Intracerebral hemorrhage ([place]) 08/07/2019  . Acute CHF (congestive heart failure) ([place]) 08/06/2019  . AKI (acute kidney injury) ([place]) 08/06/2019  . Elevated troponin 08/06/2019  . Transaminitis 08/06/2019  . Closed rib fracture 08/06/2019  . History of CVA (cerebrovascular  accident) 08/06/2019  . PAF (paroxysmal atrial fibrillation) ([place]) 10/20/2018  . NSTEMI  (non-ST elevated myocardial infarction) ([place])   . Acute CVA (cerebrovascular accident) ([place]) 02/14/2018  . TIA (transient ischemic attack) 02/13/2018  . Hepatitis C reactive 02/06/2011  . Hyponatremia 02/06/2011  . Postoperative anemia due to acute blood loss 02/05/2011  . Hypokalemia 02/05/2011  . Osteoarthritis of hip 01/27/2011  . Hypertension 01/27/2011  . Sleep apnea 01/27/2011  . Stroke/cerebrovascular accident ([place]) 01/27/2011  . Cancer of prostate [facility]) 01/27/2011    Palliative Care Assessment & Plan   Patient Profile: 87 y.o. male  with past medical history of CAD, NSTEMI, CHF with EF 20%, atrial fibrillation, CVA with residual right sided weakness, ICH, prostate cancer, CKD, and dementia presented to the ED on 02/24/20 from [facility] with complaints of leg swelling and shortness of breath. Patient was recently hospitalized from 2/10-2/15 for CHF exacerbation. He is admitted on 02/24/2020 with acute on chronic CHF exacerbation, acute on chronic respiratory failure with hypoxia, and acute on chronic CKD.  Of note, patient is already being followed by outpatient Palliative Care with [facility].   Assessment: Acute on chronic systolic CHF Acute on chronic hypoxic respiratory failure Acute on chronic CKD stage 3b Dementia  Recommendations/Plan:  Continue full scope medical treatment with watchful waiting and time for outcomes  Continue full code status as previously documented  Daughter/Laura is requesting call from attending/HF team to discuss medical updates/concern that patient is not taking Entresto  Daughter would like to get Heart Failure team's input before making further decisions  Ongoing PMT discussions pending clinical course  PMT will continue to follow and support holistically   Goals of Care and Additional Recommendations:  Limitations on Scope of Treatment: Full Scope Treatment  Code Status:    Code Status Orders  (From admission,  onward)         Start     Ordered   02/24/20 1455  Full code  Continuous        02/24/20 1457        Code Status History    Date Active Date Inactive Code Status Order ID Comments User Context   02/15/2020 1438 02/20/2020 [place] Full Code [id number]  Norval Morton, MD ED   08/31/2019 2240 09/07/2019 1313 Full Code [id number]  Orene Desanctis, DO ED   08/07/2019 2231 08/11/2019 1720 Full Code [id number]  Minda Ditto, NP ED   08/06/2019 2201 08/07/2019 2230 Full Code [id number]  Orene Desanctis, DO ED   10/19/2018 0544 10/20/2018 1814 Full Code [id number]  Meade Maw, MD ED   02/13/2018 1232 02/15/2018 2027 Full Code [id number]  Barb Merino, MD Inpatient   02/03/2011 1510 02/06/2011 1425 Full Code [id number]  Theda Sers, Margarette Asal., RN Inpatient   Advance Care Planning Activity       Prognosis:   Unable to determine; poor in the setting of advanced age, dementia, recurrent hospitalizations for CHF, and multiple comorbidities  Discharge Planning:  To Be Determined, likely back to SNF rehab with continued outpatient PC to follow  Care plan was discussed with primary RN, patient, patient's family  Thank you for allowing the Palliative Medicine Team to assist in the care of this patient.   Total Time 42 minutes Prolonged Time Billed  no       Greater than 50%  of this time was spent counseling and coordinating care related to the above assessment and plan.  Lin Landsman, NP  Please contact  Palliative Medicine Team phone at [phone] for questions and concerns.

## 2020-02-25 NOTE — Evaluation (Signed)
Clinical/Bedside Swallow Evaluation Patient Details  Name: Brian Brown MRN: [id number] Date of Birth: [birth year]  Today's Date: 02/25/2020 Time: SLP Start Time (ACUTE ONLY): 28 SLP Stop Time (ACUTE ONLY): 1034 SLP Time Calculation (min) (ACUTE ONLY): 16 min  Past Medical History:  Past Medical History:  Diagnosis Date  . Arthritis    degenerative joint disease  . Bleeding stomach ulcer 25 yrs ago  . CAD (coronary artery disease)    a. 10/2018 NSTEMI/Cath: LM nl, LAD 60p/m, 30d, D1 90, LCX 50ost/p, OM2 90 thrombotic, RCA 30p, 20d-->med rx.  . CHF (congestive heart failure) ([place])   . CKD (chronic kidney disease), stage II   . Hepatitis 1981   does not know what kind;denies jaundice  . HFrEF (heart failure with reduced ejection fraction) ([place])    a. 08/2019 Echo: EF 21%.  Marland Kitchen Hypertension   . Ischemic cardiomyopathy    a. 02/2018 Echo: EF 60-65%; b. 10/2018 Echo: EF 45-50%; c. 08/2019 Echo: EF 21%, mild LVH. RVSP 41.22mmHg. Mod BAE. Mild MR. Triv AI.  . Mild dilation of ascending aorta ([place])   . PAF (paroxysmal atrial fibrillation) ([place])    a. 10/2018 noted on Linq-->eliquis initiated (CHA2DS2VASc = 7) however d/c'd in 08/2019 2/2 cerebral bleed.  . Prostate cancer [facility]) 2002   prostate, s/p surgery  . Sleep apnea 2005   does not wear a Cpap  . Stroke [facility])    a. 2003 mild residual right leg wkns; b. 06/2227 embolic stroke, later found to have PAF.  Marland Kitchen Wears dentures    full top  . Wears glasses    Past Surgical History:  Past Surgical History:  Procedure Laterality Date  . BUBBLE STUDY  09/16/2018   Procedure: BUBBLE STUDY;  Surgeon: Lelon Perla, MD;  Location: [place];  Service: Cardiovascular;;  . CARDIAC CATHETERIZATION  10/19/2018  . CARPAL TUNNEL RELEASE Left 03/29/2013   Procedure: LEFT CARPAL TUNNEL RELEASE;  Surgeon: Wynonia Sours, MD;  Location: [place];  Service: Orthopedics;  Laterality: Left;  ANESTHESIA: IV REGIONAL FAB  . CARPAL TUNNEL  RELEASE Right 03/06/2014   Procedure: RIGHT CARPAL TUNNEL RELEASE;  Surgeon: Daryll Brod, MD;  Location: [place];  Service: Orthopedics;  Laterality: Right;  . COLONOSCOPY    . HERNIA REPAIR  2009   rt inguinal hernia  . JOINT REPLACEMENT  12/2001   rt hip ;[place] CATH AND CORONARY ANGIOGRAPHY N/A 10/19/2018   Procedure: LEFT HEART CATH AND CORONARY ANGIOGRAPHY;  Surgeon: Wellington Hampshire, MD;  Location: [facility] CV LAB;  Service: Cardiovascular;  Laterality: N/A;  . LOOP RECORDER INSERTION N/A 09/16/2018   Procedure: LOOP RECORDER INSERTION;  Surgeon: Constance Haw, MD;  Location: [place] CV LAB;  Service: Cardiovascular;  Laterality: N/A;  . PROSTATECTOMY  2002  . SHOULDER ARTHROSCOPY W/ ROTATOR CUFF REPAIR  2014   lt-dr whitfield  . TEE WITHOUT CARDIOVERSION N/A 09/16/2018   Procedure: TRANSESOPHAGEAL ECHOCARDIOGRAM (TEE);  Surgeon: Lelon Perla, MD;  Location: [facility] ENDOSCOPY;  Service: Cardiovascular;  Laterality: N/A;  . TOTAL HIP ARTHROPLASTY  02/03/2011   Procedure: TOTAL HIP ARTHROPLASTY;  Surgeon: Garald Balding, MD;  Location: [place];  Service: Orthopedics;  Laterality: Left;  . TRIGGER FINGER RELEASE Left 03/29/2013   Procedure: LEFT MIDDLE/SMALL FINGER RELEASE A-1 PULLEY AND LEFT RING FINGER;  Surgeon: Wynonia Sours, MD;  Location: [place];  Service: Orthopedics;  Laterality: Left;  .  TRIGGER FINGER RELEASE Right 03/06/2014   Procedure: RIGHT MIDDLE FINGER,RIGHT RINGER FINGER;  Surgeon: Daryll Brod, MD;  Location: [place];  Service: Orthopedics;  Laterality: Right;   HPI:  Pt is an 87 yo male presenting from SNF with SOB and hypoxia, admitted for CHF exacerbation. Pt had a recent hospitalization 2/10-2/15 for CHF exacerbation, prior to which he was living at home. PMH includes: CAD, NSTEMI, CHF with EF 20%, paroxysmal A. fib on Eliquis, CVA with residual right-sided weakness, history of ICH, HTN    Assessment / Plan / Recommendation Clinical Impression  Pt has intermittent coughing noted with thin liquids, that is primarily observed during moments of impulsivity in which he obtains larger volumes at a more rapid rate. When assist is oprovided by SLP to better regulate pacing, no further signs concerning for dysphagia are noted. Recommend maintaining regular solids and thin liquids but with focus on slower rate and use of general aspiration precautions. Given signs of potential dysphagia observed today, will f/u for ongoing assessment of function and safety. SLP Visit Diagnosis: Dysphagia, unspecified (R13.10)    Aspiration Risk  Mild aspiration risk    Diet Recommendation Regular;Thin liquid   Liquid Administration via: Cup;Straw Medication Administration: Whole meds with puree Supervision: Full supervision/cueing for compensatory strategies;Staff to assist with self feeding Compensations: Minimize environmental distractions;Slow rate;Small sips/bites Postural Changes: Seated upright at 90 degrees    Other  Recommendations Oral Care Recommendations: Oral care BID   Follow up Recommendations Skilled Nursing facility      Frequency and Duration min 2x/week  1 week       Prognosis Prognosis for Safe Diet Advancement: Good      Swallow Study   General HPI: Pt is an 87 yo male presenting from SNF with SOB and hypoxia, admitted for CHF exacerbation. Pt had a recent hospitalization 2/10-2/15 for CHF exacerbation, prior to which he was living at home. PMH includes: CAD, NSTEMI, CHF with EF 20%, paroxysmal A. fib on Eliquis, CVA with residual right-sided weakness, history of ICH, HTN Type of Study: Bedside Swallow Evaluation Previous Swallow Assessment: none in chart Diet Prior to this Study: Regular;Thin liquids Temperature Spikes Noted: No Respiratory Status: Nasal cannula History of Recent Intubation: No Behavior/Cognition: Alert;Cooperative;Pleasant mood;Confused;Requires  cueing Oral Cavity Assessment: Within Functional Limits Oral Care Completed by SLP: No Oral Cavity - Dentition: Adequate natural dentition Vision: Functional for self-feeding Self-Feeding Abilities: Needs assist Patient Positioning: Upright in bed Baseline Vocal Quality: Normal Volitional Cough: Weak Volitional Swallow: Able to elicit    Oral/Motor/Sensory Function Overall Oral Motor/Sensory Function: Within functional limits   Ice Chips Ice chips: Within functional limits Presentation: Spoon   Thin Liquid Thin Liquid: Impaired Presentation: Straw;Cup Pharyngeal  Phase Impairments: Cough - Immediate    Nectar Thick Nectar Thick Liquid: Not tested   Honey Thick Honey Thick Liquid: Not tested   Puree Puree: Within functional limits Presentation: Spoon   Solid     Solid: Within functional limits      Osie Bond., M.A. [place] Pager [phone] Office [phone]  02/25/2020,11:23 AM

## 2020-02-25 NOTE — Plan of Care (Signed)

## 2020-02-25 NOTE — Progress Notes (Signed)
[facility])  Brian Brown is our current palliative patient. We will follow [name] during hospital stay and will help with any discharge needs.   Please feel free to contact [facility] liaisons with any questions and concerns.  Clementeen Hoof, BSN, [employer] [phone]

## 2020-02-25 NOTE — Progress Notes (Signed)
PROGRESS NOTE  Brian Brown DVV:[id number] DOB: [birth year] DOA: 02/24/2020 PCP: Brian Carol, MD  Brief History   Brian Brown is a 87 y.o. male with history h/o CAD, NSTEMI, CHF with EF 20%, paroxysmal A. fib on Eliquis, CVA with residual right-sided weakness, history of ICH, HTN who was recently admitted to this [facility] from 2/10-2/15/2022 and discharged to SNF, presents with worsening dyspnea and noted to be hypoxic at the facility.  Patient has dementia with limited ability to provide history.  Per EDP, patient hypoxic with O2 sats 76% on room air on arrival here.  Patient initially placed on 2 L O2 and was titrated up to 5 L with which his O2 sat was in low 90s but still tachypneic requiring BiPAP therapy for about 30 minutes.  Patient was discharged on Lasix 40 mg twice daily and apparently has not missed any recent doses per EDP d/w staff.  Patient taken off BiPAP therapy few minutes before my visit, and entered the room, he is noted to be sitting upright and comfortable, not on any O2 (nasal cannula on bedside table) and saturating 86%.  Placed back on nasal cannula (with wall unit showing 5 L)-saturating 94%.  Patient states he has 6 children, was able to name his 3 daughters.  Disoriented to time, acknowledges that he lives in a facility now. ED work-up: Tachypneic and hypoxic on arrival as described above.  BNP elevated 1925->2617.  BMP shows worsening creatinine from 1.4->2.1.  Chest x-ray with pulmonary vascular congestion, small pleural effusions and underlying atelectasis.  EKG shows A. fib, no acute ST-T changes.    [facility] were consulted to admit the patient for further evaluation and treatment. The patient was admitted to a telemetry bed. Palliative care was consutled. The patient is being treated with diuresis.  Consultants  . Palliative care . Hospice . Heart failure team  Procedures  . None  Antibiotics   Anti-infectives (From admission, onward)    None    .  Subjective  The patient is lying in bed. No new complaints.  Objective   Vitals:  Vitals:   02/25/20 1044 02/25/20 1100  BP: 118/86 118/86  Pulse: 91 89  Resp:  18  Temp:  98.2 F (36.8 C)  SpO2:      Exam:  Constitutional:  . The patient is awake, alert, and oriented x 3. No acute distress. Respiratory:  Marland Kitchen Mildly increased work of breathing with conversation. . No wheezes, rales, or rhonchi . No tactile fremitus Cardiovascular:  . Irregular rate and rhythm . No murmurs, ectopy, or gallups. . No lateral PMI. No thrills. Abdomen:  . Abdomen is soft, non-tender, non-distended . No hernias, masses, or organomegaly . Normoactive bowel sounds.  Musculoskeletal:  . No cyanosis or clubbing . There is +1 pitting edem of lower extremity bilaterally Skin:  . No rashes, lesions, ulcers . palpation of skin: no induration or nodules Neurologic:  . CN 2-12 intact . Sensation all 4 extremities intact  I have personally reviewed the following:   Today's Data  . Vitals, BMP  Imaging  . CXR  Cardiology Data  . EKG - unchanged from previous  Scheduled Meds: . apixaban  2.5 mg Oral BID  . atorvastatin  40 mg Oral Daily  . Chlorhexidine Gluconate Cloth  6 each Topical Daily  . famotidine  20 mg Oral QHS  . furosemide  40 mg Intravenous Q12H  . latanoprost  1 drop Both Eyes QHS  . metoprolol  tartrate  25 mg Oral BID  . omega-3 acid ethyl esters  1 g Oral Daily  . potassium chloride  20 mEq Oral Daily  . sodium chloride flush  3 mL Intravenous Q12H  . cyanocobalamin  100 mcg Oral Daily   Continuous Infusions:  Principal Problem:   Acute on chronic systolic CHF (congestive heart failure) (HCC) Active Problems:   Hypertension   Sleep apnea   Stroke/cerebrovascular accident [facility])   NSTEMI (non-ST elevated myocardial infarction) ([place])   PAF (paroxysmal atrial fibrillation) ([place])   AKI (acute kidney injury) ([place])   Intracerebral hemorrhage ([place])    Coronary artery disease   Dementia ([place])   LOS: 1 day   A & P  Acute on chronic systolic CHF: Patient's BNP and most recent admission was elevated up to 2983 and it improved to 1900 with IV Lasix twice daily-transition to p.o. 40 mg twice daily prior to discharge.  His echo on 02/16/2020 showed EF 20%.  He was supposed to follow-up cardiology regarding starting Entresto if in 1 renal function considered stable.  Now with worsening CHF again and BNP up to 2600.  Will admit with IV diuresis, close I's and O's. Palliative care has been consulted and is in discussions regarding goals of care with the patient's daughter Brian Brown. Brian Brown will talk with the patient herself before making a decision on changing code status. Family is concerned that the patient's decompensation is the result of poor care at the facility to which he had been discharged earlier this month.  Acute on chronic hypoxic respiratory failure: Per last discharge summary, it appears that patient was supposed to be on 3 L O2 but unclear if he was actually using nasal cannula as he arrived here without any.  Requiring up to 5 L of O2 today and s/p brief BiPAP therapy in the ED-improved now and ABG satisfactory.  Continue to monitor closely and titrate O2 as needed.  Acute on chronic CKD stage IIIb: Patient's baseline creatinine around 1.3-1.4.  Likely worsened in the setting of volume overload/congested kidney.  He did show improvement with IV diuretics in last admission, will monitor closely in this admission as well and adjust diuretics accordingly.  Check bladder scan to rule out retention (d/w bedside nurse).  Paroxysmal atrial fibrillation: Resume beta-blockers and Eliquis.  Hypertension/CAD/non-STEMI: Resume home medications.  EKG at baseline.  No acute ST-T changes.  No complaints of chest pain.  Monitor blood pressure with IV diuresis.  History of CVA/ICH: Continue statins and chronic anticoagulation.  Dementia with memory  deficits: Patient was living alone prior to last admission and discharged to SNF as he was unable to manage medications at home due to memory issues.  He presents today from SNF.  Resume home medications.  Delirium precautions.  Palliative care consult for goals of care discussion given advanced age, dementia, recurrent hospitalizations and multiple comorbidities.  I have seen and examined this patient myself. I have spent 35 minutes in her evaluation and care.  DVT Prophylaxis: Eliquis CODE STATUS: Full Code Family Communication: None available Disposition:  Status is: Inpatient  Remains inpatient appropriate because:Inpatient level of care appropriate due to severity of illness  Dispo: The patient is from: SNF              Anticipated d/c is to: SNF              Anticipated d/c date is: 2 days  Patient currently is not medically stable to d/c.   Difficult to place patient No  Rafan Sanders, DO [facility] Direct contact: see [email or web address]  7PM-7AM contact night coverage as above 02/25/2020, 3:14 PM  LOS: 1 day

## 2020-02-25 NOTE — Progress Notes (Signed)
Pt is on Nasal cannula 4L and no resp distress noted. Pt said he does not wear anything at home also. RN aware. BIPAP is not needed at this time. RN aware to call if BIPAP needed. RT will continue to monitor.

## 2020-02-26 DIAGNOSIS — F0390 Unspecified dementia without behavioral disturbance: Secondary | ICD-10-CM | POA: Diagnosis not present

## 2020-02-26 DIAGNOSIS — Z515 Encounter for palliative care: Secondary | ICD-10-CM | POA: Diagnosis not present

## 2020-02-26 DIAGNOSIS — N179 Acute kidney failure, unspecified: Secondary | ICD-10-CM | POA: Diagnosis not present

## 2020-02-26 DIAGNOSIS — I5023 Acute on chronic systolic (congestive) heart failure: Secondary | ICD-10-CM | POA: Diagnosis not present

## 2020-02-26 LAB — BASIC METABOLIC PANEL
Anion gap: 16 — ABNORMAL HIGH (ref 5–15)
BUN: 36 mg/dL — ABNORMAL HIGH (ref 8–23)
CO2: 28 mmol/L (ref 22–32)
Calcium: 9.4 mg/dL (ref 8.9–10.3)
Chloride: 100 mmol/L (ref 98–111)
Creatinine, Ser: 2.12 mg/dL — ABNORMAL HIGH (ref 0.61–1.24)
GFR, Estimated: 30 mL/min — ABNORMAL LOW (ref >60)
Glucose, Bld: 90 mg/dL (ref 70–99)
Potassium: 3.4 mmol/L — ABNORMAL LOW (ref 3.5–5.1)
Sodium: 144 mmol/L (ref 135–145)

## 2020-02-26 MED ORDER — POTASSIUM CHLORIDE 10 MEQ/100ML IV SOLN
10.0000 meq | Freq: Once | INTRAVENOUS | Status: AC
  Administered 2020-02-26: 10 meq via INTRAVENOUS
  Filled 2020-02-26: qty 100

## 2020-02-26 NOTE — Plan of Care (Signed)

## 2020-02-26 NOTE — Progress Notes (Signed)
Heart Failure Navigator Progress Note  Assessed for Heart & Vascular TOC clinic readiness.  Unfortunately at this time the patient does not meet criteria due to confusion and palliative consult measures.   Navigator available for reassessment of patient.   Pricilla Holm, RN, BSN Heart Failure Nurse Navigator [phone]

## 2020-02-26 NOTE — Progress Notes (Signed)
Pharmacist Heart Failure Core Measure Documentation  Assessment: RUSTYN CONERY has an EF documented as <20% on 02/16/2020 by ECHO.  Rationale: Heart failure patients with left ventricular systolic dysfunction (LVSD) and an EF < 40% should be prescribed an angiotensin converting enzyme inhibitor (ACEI) or angiotensin receptor blocker (ARB) at discharge unless a contraindication is documented in the medical record.  This patient is not currently on an ACEI or ARB for HF.  This note is being placed in the record in order to provide documentation that a contraindication to the use of these agents is present for this encounter.  ACE Inhibitor or Angiotensin Receptor Blocker is contraindicated (specify all that apply)  []   ACEI allergy AND ARB allergy []   Angioedema []   Moderate or severe aortic stenosis []   Hyperkalemia []   Hypotension []   Renal artery stenosis [x]   Worsening renal function, preexisting renal disease or dysfunction  Was previously on entresto which was held for Scr as an outpatient- Scr still elevated so continue to hold at this time.   Antonietta Jewel, PharmD, [place] Clinical Pharmacist  Phone: [phone] 02/26/2020 2:38 PM  Please check AMION for all [place] phone numbers After 10:00 PM, call [place] [phone]'

## 2020-02-26 NOTE — Consult Note (Signed)
   [facility] Inpatient Consult   02/26/2020  TOMY KHIM 12-Sep-1932 [id number]  [facility] [ACO] Patient: [facility] Medicare   Patient screened for hospitalization with noted extreme high risk score for unplanned readmission less than 7 days noted  and  to assess for potential [place] Management service needs for post hospital transition.  Review of patient's medical record reveals patient is from a skilled nursing facility, [facility] Noted a Palliative Care consult in place and reviewed notes and note is a palliative care patient followed by [facility]. .  Plan:  Continue to follow progress and disposition to assess for post hospital care management needs.  Currently, anticipating no [facility] Management needs at this time.  For questions contact:   Natividad Brood, RN BSN [facility] Liaison  [phone] business mobile phone Toll free office [phone]  Fax number: [phone] [email or web address]

## 2020-02-26 NOTE — Progress Notes (Signed)
Patient had 5 runs of VTach followed by 4 runs per CCMD.  VS: BP 126/102, HR 103, 100% on 4L Nasal Cannula. EKG performed, placed in chart.   Night Shift NP, Glenford Peers made aware.

## 2020-02-26 NOTE — Progress Notes (Signed)
PROGRESS NOTE  Brian Brown SEG:[id number] DOB: 1932/10/05 DOA: 02/24/2020 PCP: Seward Carol, MD  Brief History   Brian Brown is a 87 y.o. male with history h/o CAD, NSTEMI, CHF with EF 20%, paroxysmal A. fib on Eliquis, CVA with residual right-sided weakness, history of ICH, HTN who was recently admitted to this [facility] from 2/10-2/15/2022 and discharged to SNF, presents with worsening dyspnea and noted to be hypoxic at the facility.  Patient has dementia with limited ability to provide history.  Per EDP, patient hypoxic with O2 sats 76% on room air on arrival here.  Patient initially placed on 2 L O2 and was titrated up to 5 L with which his O2 sat was in low 90s but still tachypneic requiring BiPAP therapy for about 30 minutes.  Patient was discharged on Lasix 40 mg twice daily and apparently has not missed any recent doses per EDP d/w staff.  Patient taken off BiPAP therapy few minutes before my visit, and entered the room, he is noted to be sitting upright and comfortable, not on any O2 (nasal cannula on bedside table) and saturating 86%.  Placed back on nasal cannula (with wall unit showing 5 L)-saturating 94%.  Patient states he has 6 children, was able to name his 3 daughters.  Disoriented to time, acknowledges that he lives in a facility now. ED work-up: Tachypneic and hypoxic on arrival as described above.  BNP elevated 1925->2617.  BMP shows worsening creatinine from 1.4->2.1.  Chest x-ray with pulmonary vascular congestion, small pleural effusions and underlying atelectasis.  EKG shows A. fib, no acute ST-T changes.    [facility] were consulted to admit the patient for further evaluation and treatment. The patient was admitted to a telemetry bed. Palliative care was consutled. The patient is being treated with diuresis.  Cardiology has been consulted. The "heart failure navigator" determined that the patient did not meet criteria, because palliative care had been  consulted on him. However, palliative care discussion with the family determined that they wanted the patient to have a full scope of therapy and to remain a full code. I have re-consulted cardiology today.  Consultants  . Palliative care . Hospice . Heart failure team  Procedures  . None  Antibiotics   Anti-infectives (From admission, onward)   None     Subjective  The patient is lying in bed. As I enter the room the patient is in respiratory distress with forced expiratory wheezes. SaO2 was in the upper 60's. The patient is a mouth breather. His Oxygen saturations improved when he was placed on a venti-mask. Daughter is at bedside. All questions answered to the best of my ability.  Objective   Vitals:  Vitals:   02/26/20 0747 02/26/20 1119  BP: (!) 121/107 114/89  Pulse: 94 77  Resp: 20 18  Temp: 97.7 F (36.5 C) 97.6 F (36.4 C)  SpO2: 98% 100%   Exam:  Constitutional:  . The patient is lethargic. Moderate acute distress from respiratory distress. Respiratory:  . Positive for increased work breathing. Marland Kitchen Positive for scattered rales.  . No wheezes or rhonchi . No tactile fremitus Cardiovascular:  . Regular rate and rhythm . No murmurs, ectopy, or gallups. . No lateral PMI. No thrills. Abdomen:  . Abdomen is soft, non-tender, non-distended . No hernias, masses, or organomegaly . Normoactive bowel sounds.  Musculoskeletal:  . No cyanosis, clubbing, or edema Skin:  . No rashes, lesions, ulcers . palpation of skin: no induration or nodules  Neurologic:  . CN 2-12 intact . Sensation all 4 extremities intact Psychiatric:  . Mental status o Mood, affect appropriate o Orientation to person, place, time  . judgment and insight appear intact  Today's Data  . Vitals, BMP  Imaging  . CXR  Cardiology Data  . EKG - unchanged from previous  Scheduled Meds: . apixaban  2.5 mg Oral BID  . atorvastatin  40 mg Oral Daily  . Chlorhexidine Gluconate Cloth  6  each Topical Daily  . famotidine  20 mg Oral QHS  . furosemide  40 mg Intravenous Q12H  . latanoprost  1 drop Both Eyes QHS  . metoprolol tartrate  25 mg Oral BID  . omega-3 acid ethyl esters  1 g Oral Daily  . potassium chloride  20 mEq Oral Daily  . sodium chloride flush  3 mL Intravenous Q12H  . cyanocobalamin  100 mcg Oral Daily   Principal Problem:   Acute on chronic systolic CHF (congestive heart failure) (HCC) Active Problems:   Hypertension   Sleep apnea   Stroke/cerebrovascular accident [facility])   NSTEMI (non-ST elevated myocardial infarction) ([place])   PAF (paroxysmal atrial fibrillation) ([place])   AKI (acute kidney injury) ([place])   Intracerebral hemorrhage ([place])   Coronary artery disease   Dementia ([place])   LOS: 2 days   A & P  Acute on chronic systolic CHF: Patient's BNP and most recent admission was elevated up to 2983 and it improved to 1900 with IV Lasix twice daily-transition to p.o. 40 mg twice daily prior to discharge.  His echo on 02/16/2020 showed EF 20%.  He was supposed to follow-up cardiology regarding starting Entresto if in 1 renal function considered stable.  Now with worsening CHF again and BNP up to 2600.  Will admit with IV diuresis, close I's and O's. Palliative care has been consulted and is in discussions regarding goals of care with the patient's daughter [name]. [name] will talk with the patient herself before making a decision on changing code status. Family is concerned that the patient's decompensation is the result of poor care at the facility to which he had been discharged earlier this month. Cardiology has been re-consulted today. The patient is currently negative 18,394 cc in terms of fluid balance.   Acute on chronic hypoxic respiratory failure: Per last discharge summary, it appears that patient was supposed to be on 3 L O2 at home, but it is unclear if he was actually using nasal cannula as he arrived here without any. Today the patient required 15L to  obtain an SaO2 of 100% with nasal cannula. The patient is a mouth breather. When he was changed to a venti-mask, his Oxygen requirements decreased to 4L. Continue diuresis.  Acute on chronic CKD stage IIIb: Patient's baseline creatinine around 1.3-1.4.  Likely worsened in the setting of volume overload/congested kidney.  He did show improvement with IV diuretics in last admission, will monitor closely in this admission as well and adjust diuretics accordingly.    Paroxysmal atrial fibrillation: Resume beta-blockers and Eliquis.  Hypertension/CAD/non-STEMI: Resume home medications.  EKG at baseline.  No acute ST-T changes.  No complaints of chest pain.  Monitor blood pressure with IV diuresis.  History of CVA/ICH: Continue statins and chronic anticoagulation.  Dementia with memory deficits: Patient was living alone prior to last admission and discharged to SNF as he was unable to manage medications at home due to memory issues.  He presents today from SNF.  Resume home medications.  Delirium precautions.  Palliative care consult for goals of care discussion given advanced age, dementia, recurrent hospitalizations and multiple comorbidities.  I have seen and examined this patient myself. I have spent 32 minutes in her evaluation and care.  DVT Prophylaxis: Eliquis CODE STATUS: Full Code Family Communication: Daughter is at bedside. All questions answered to the best of my ability. The daughter who is from [place] asks that she receive daily updates and that she and her siblings are given 24-48 hours notice prior to discharge. I have explained that this is difficulty given the current bed situation.  Disposition:  Status is: Inpatient  Remains inpatient appropriate because:Inpatient level of care appropriate due to severity of illness  Dispo: The patient is from: SNF              Anticipated d/c is to: SNF              Anticipated d/c date is: 2 days              Patient currently is not  medically stable to d/c.   Difficult to place patient No  Brian Hallstrom, DO [facility] Direct contact: see [email or web address]  7PM-7AM contact night coverage as above 02/26/2020, 4:06 PM  LOS: 1 day

## 2020-02-26 NOTE — Consult Note (Signed)
Cardiology Consultation:   Patient ID: Brian Brown; [id number]; June 17, 1932   Admit date: 02/24/2020 Date of Consult: 02/26/2020  Primary Care Provider: Seward Carol, MD Primary Cardiologist: Larae Grooms, MD  Patient Profile:   Brian Brown is a 87 y.o. male with a hx of CAD s/p NSTEMI 10/2018, CVA x 2 (last 02/2018), PAF, HTN, HLD, ICM/HFrEF (EF 21% 08/2019), CKD II, and R thalamic bleed (08/2019) prompting discontinuation of Eliquis and Plavix therapy, who is being seen today for the evaluation of acute on chronic systolic heart failure at the request of Dr. Benny Lennert.  History of Present Illness:   Brian Brown is an 87 year old male with a history as stated above who presented to [facility] 02/24/2020. He was noted to have recently been hospitalized from 2/10-2/15/2022 and discharged to an SNF facility however he began having worsening dyspnea and was noted to be hypoxic therefore he was sent to the ED for further evaluation. HPI obtained from chart review as he is unable to answer questions regarding his current state of health. On ED arrival, O2 saturations noted to be 76% on room air. He was tachypneic and required BiPAP ventilation for approximately 30 minutes. BNP noted to be elevated at 1925>> 2617 with CXR consistent with pulmonary vascular congestion and small pleural effusions with underlying atelectasis.  EKG showed atrial fibrillation with stable rates. He has been treated with IV Lasix 40mg  BID with some improvement.  Palliative care has been consulted regarding goals of care.  On my assessment, I was able to illicit a response however this was after sternal rub. He will open his eyes however he is unable to answer appropriate questions.   He has a complex past medical history.  He suffered a stroke in 2003 with subsequent right sided weakness and chronic right lower extremity swelling.  He had a second stroke 02/2018 with deficits including left hemianopsia.  Imaging revealed a  right PCA infarct consistent with embolic pattern.  LVEF at that time was 60 to 65%. He underwent [employer] Linq placement 09/2018 which subsequently showed paroxysmal atrial fibrillation.  He had an episode of chest pain 10/2018 prompting a hospitalization for which he ruled in for non-STEMI and underwent diagnostic catheterization revealing subtotal thrombotic occlusion of the distal OM 2 as well as a 90% stenosis in a small diagonal branch with otherwise nonobstructive disease.  Medical management was recommended and he was placed back on Plavix and Eliquis.  Echocardiogram unfortunately showed a reduced LVEF at 45 to 50%.  In 08/07/2019 he was readmitted for progressive fatigue after a fall at which time a repeat echocardiogram showed a new severely reduced LV dysfunction at 21%.  He was diuresed and subsequently developed mental status changes concerning for stroke.  Head CT was performed showing a right lateral ophthalmic intracranial hemorrhage.  Neurology was consulted at which time Plavix and Eliquis were discontinued although he was cleared to continue ASA therapy with plans to follow head CT from that time.    He was once again admitted 09/02/2019 for CHF at which time he was transitioned from losartan to [facility] with plans for limited echocardiogram.  There was discussion regarding Takotsubo/stress-induced cardiomyopathy.  Limited echo showed an estimated LVEF at 20% with global hypokinesis.  Plan at cardiology sign off 09/06/2019 was to continue to titrate medications.   He was seen in follow-up 09/08/2019 and was noted to have been placed back on Eliquis for PAF for stroke prevention.  Most recent follow-up with primary cardiologist  01/10/2020 he was noted to have progressive dementia with 24-hour care needed.  He was continued on Eliquis and Toprol for PAF.  He is also been followed by home health palliative care.  There was discussion on having him moved to [place] to be closer to his daughter however  patient deferred.  Family called the office 01/30/2020 with worsening LE edema.  This was reviewed by primary cardiologist to recommended to continue Lasix twice daily 3 times weekly however if swelling persisted plan was to increase to twice daily dosing of 4 times weekly.  Past Medical History:  Diagnosis Date  . Arthritis    degenerative joint disease  . Bleeding stomach ulcer 25 yrs ago  . CAD (coronary artery disease)    a. 10/2018 NSTEMI/Cath: LM nl, LAD 60p/m, 30d, D1 90, LCX 50ost/p, OM2 90 thrombotic, RCA 30p, 20d-->med rx.  . CHF (congestive heart failure) ([place])   . CKD (chronic kidney disease), stage II   . Hepatitis 1981   does not know what kind;denies jaundice  . HFrEF (heart failure with reduced ejection fraction) ([place])    a. 08/2019 Echo: EF 21%.  Marland Kitchen Hypertension   . Ischemic cardiomyopathy    a. 02/2018 Echo: EF 60-65%; b. 10/2018 Echo: EF 45-50%; c. 08/2019 Echo: EF 21%, mild LVH. RVSP 41.25mmHg. Mod BAE. Mild MR. Triv AI.  . Mild dilation of ascending aorta ([place])   . PAF (paroxysmal atrial fibrillation) ([place])    a. 10/2018 noted on Linq-->eliquis initiated (CHA2DS2VASc = 7) however d/c'd in 08/2019 2/2 cerebral bleed.  . Prostate cancer [facility]) 2002   prostate, s/p surgery  . Sleep apnea 2005   does not wear a Cpap  . Stroke [facility])    a. 2003 mild residual right leg wkns; b. 02/6946 embolic stroke, later found to have PAF.  Marland Kitchen Wears dentures    full top  . Wears glasses     Past Surgical History:  Procedure Laterality Date  . BUBBLE STUDY  09/16/2018   Procedure: BUBBLE STUDY;  Surgeon: Lelon Perla, MD;  Location: [place];  Service: Cardiovascular;;  . CARDIAC CATHETERIZATION  10/19/2018  . CARPAL TUNNEL RELEASE Left 03/29/2013   Procedure: LEFT CARPAL TUNNEL RELEASE;  Surgeon: Wynonia Sours, MD;  Location: [place];  Service: Orthopedics;  Laterality: Left;  ANESTHESIA: IV REGIONAL FAB  . CARPAL TUNNEL RELEASE Right 03/06/2014   Procedure:  RIGHT CARPAL TUNNEL RELEASE;  Surgeon: Daryll Brod, MD;  Location: [place];  Service: Orthopedics;  Laterality: Right;  . COLONOSCOPY    . HERNIA REPAIR  2009   rt inguinal hernia  . JOINT REPLACEMENT  12/2001   rt hip ;[place] CATH AND CORONARY ANGIOGRAPHY N/A 10/19/2018   Procedure: LEFT HEART CATH AND CORONARY ANGIOGRAPHY;  Surgeon: Wellington Hampshire, MD;  Location: [place] CV LAB;  Service: Cardiovascular;  Laterality: N/A;  . LOOP RECORDER INSERTION N/A 09/16/2018   Procedure: LOOP RECORDER INSERTION;  Surgeon: Constance Haw, MD;  Location: [place] CV LAB;  Service: Cardiovascular;  Laterality: N/A;  . PROSTATECTOMY  2002  . SHOULDER ARTHROSCOPY W/ ROTATOR CUFF REPAIR  2014   lt-dr whitfield  . TEE WITHOUT CARDIOVERSION N/A 09/16/2018   Procedure: TRANSESOPHAGEAL ECHOCARDIOGRAM (TEE);  Surgeon: Lelon Perla, MD;  Location: [facility] ENDOSCOPY;  Service: Cardiovascular;  Laterality: N/A;  . TOTAL HIP ARTHROPLASTY  02/03/2011   Procedure: TOTAL HIP ARTHROPLASTY;  Surgeon: Garald Balding, MD;  Location:  [place] OR;  Service: Orthopedics;  Laterality: Left;  . TRIGGER FINGER RELEASE Left 03/29/2013   Procedure: LEFT MIDDLE/SMALL FINGER RELEASE A-1 PULLEY AND LEFT RING FINGER;  Surgeon: Wynonia Sours, MD;  Location: [place];  Service: Orthopedics;  Laterality: Left;  . TRIGGER FINGER RELEASE Right 03/06/2014   Procedure: RIGHT MIDDLE FINGER,RIGHT RINGER FINGER;  Surgeon: Daryll Brod, MD;  Location: [place];  Service: Orthopedics;  Laterality: Right;     Prior to Admission medications   Medication Sig Start Date End Date Taking? Authorizing Provider  acetaminophen (TYLENOL) 325 MG tablet Take 650 mg by mouth every 6 (six) hours as needed for mild pain or headache.   Yes [provider]  apixaban (ELIQUIS) 2.5 MG TABS tablet Take 1 tablet (2.5 mg total) by mouth 2 (two) times daily. 02/20/20  Yes Regalado,  Belkys A, MD  atorvastatin (LIPITOR) 40 MG tablet TAKE 1 TABLET EVERY DAY Patient taking differently: Take 40 mg by mouth in the morning. 07/24/19  Yes Jettie Booze, MD  famotidine (PEPCID) 20 MG tablet Take 1 tablet (20 mg total) by mouth at bedtime. 08/11/19  Yes Little Ishikawa, MD  furosemide (LASIX) 40 MG tablet Take 1 tablet (40 mg total) by mouth 2 (two) times daily. 02/05/20  Yes Jettie Booze, MD  latanoprost (XALATAN) 0.005 % ophthalmic solution Place 1 drop into both eyes at bedtime.   Yes [provider]  metoprolol tartrate (LOPRESSOR) 25 MG tablet Take 25 mg by mouth 2 (two) times daily.   Yes [provider]  Multiple Vitamin (MULTIVITAMIN WITH MINERALS) TABS tablet Take 1 tablet by mouth daily. 08/11/19  Yes Little Ishikawa, MD  nitroGLYCERIN (NITROSTAT) 0.4 MG SL tablet Place 1 tablet (0.4 mg total) under the tongue every 5 (five) minutes x 3 doses as needed for chest pain. Patient taking differently: Place 0.4 mg under the tongue every 5 (five) minutes as needed for chest pain (max 3 doses). 11/22/18  Yes Jettie Booze, MD  Omega-3 Fatty Acids (FISH OIL) 1000 MG CAPS Take 1,000 mg by mouth daily.   Yes [provider]  potassium chloride (KLOR-CON) 10 MEQ tablet Take 2 tablets (20 mEq total) by mouth daily. 02/20/20  Yes Regalado, Belkys A, MD  sulfamethoxazole-trimethoprim (BACTRIM DS) 800-160 MG tablet Take 1 tablet by mouth 2 (two) times daily.   Yes [provider]  vitamin B-12 100 MCG tablet Take 1 tablet (100 mcg total) by mouth daily. Patient taking differently: Take 100 mcg by mouth in the morning. 02/21/20  Yes Regalado, Belkys A, MD  metoprolol succinate (TOPROL-XL) 25 MG 24 hr tablet TAKE 1 TABLET (25 MG TOTAL) BY MOUTH 2 (TWO) TIMES DAILY WITH A MEAL. Patient not taking: Reported on 02/24/2020 12/20/19   Jettie Booze, MD    Inpatient Medications: Scheduled Meds: . apixaban  2.5 mg Oral BID  .  atorvastatin  40 mg Oral Daily  . Chlorhexidine Gluconate Cloth  6 each Topical Daily  . famotidine  20 mg Oral QHS  . furosemide  40 mg Intravenous Q12H  . latanoprost  1 drop Both Eyes QHS  . metoprolol tartrate  25 mg Oral BID  . omega-3 acid ethyl esters  1 g Oral Daily  . potassium chloride  20 mEq Oral Daily  . sodium chloride flush  3 mL Intravenous Q12H  . cyanocobalamin  100 mcg Oral Daily   Continuous Infusions:  PRN Meds: acetaminophen, hydrALAZINE, nitroGLYCERIN,  OLANZapine zydis, sodium chloride flush  Allergies:   No Known Allergies  Social History:   Social History   Socioeconomic History  . Marital status: Widowed    Spouse name: Not on file  . Number of children: Not on file  . Years of education: Not on file  . Highest education level: Not on file  Occupational History  . Not on file  Tobacco Use  . Smoking status: Former Smoker    Packs/day: 1.00    Years: 30.00    Pack years: 30.00    Types: Cigarettes    Quit date: 01/27/2008    Years since quitting: 12.0  . Smokeless tobacco: Never Used  Vaping Use  . Vaping Use: Never used  Substance and Sexual Activity  . Alcohol use: No  . Drug use: No  . Sexual activity: Not on file  Other Topics Concern  . Not on file  Social History Narrative   Lives in [place] by himself, with his bulldog.  He walks his dog daily.  3 daughters in [place].   Social Determinants of Health   Financial Resource Strain: Not on file  Food Insecurity: Not on file  Transportation Needs: Not on file  Physical Activity: Not on file  Stress: Not on file  Social Connections: Not on file  Intimate Partner Violence: Not on file    Family History:   Family History  Problem Relation Age of Onset  . Heart disease Mother   . Heart disease Father    Family Status:  Family Status  Relation Name Status  . Mother  Deceased  . Father  Deceased  . MGM  Deceased  . MGF  Deceased  . PGM  Deceased  . PGF  Deceased    ROS:   Please see the history of present illness.  All other ROS reviewed and negative.     Physical Exam/Data:   Vitals:   02/26/20 0348 02/26/20 0355 02/26/20 0747 02/26/20 1119  BP: (!) 130/93  (!) 121/107 114/89  Pulse: 93  94 77  Resp: 20  20 18   Temp: 98.3 F (36.8 C)  97.7 F (36.5 C) 97.6 F (36.4 C)  TempSrc: Oral  Oral Oral  SpO2: 99%  98% 100%  Weight:  76.9 kg    Height:        Intake/Output Summary (Last 24 hours) at 02/26/2020 1547 Last data filed at 02/26/2020 1024 Gross per 24 hour  Intake 148.37 ml  Output 4425 ml  Net -4276.63 ml   Filed Weights   02/24/20 1413 02/25/20 0440 02/26/20 0355  Weight: 83 kg 81.1 kg 76.9 kg   Body mass index is 25.03 kg/m.   General: Elderly, NAD Lungs:Clear to ausculation bilaterally. Breathing is unlabored. Cardiovascular: Irregularly irregular. No murmur Abdomen: Soft, non-tender, non-distended. No obvious abdominal masses. Extremities: 1-2+ pedal edema. Radial pulses 2+ bilaterally Neuro: Alert and oriented. No focal deficits. No facial asymmetry. MAE spontaneously. Psych: Responds to questions appropriately with normal affect.     EKG:  The EKG was personally reviewed and demonstrates: 02/24/20 AF with rate control and no acute changes  Telemetry:  Telemetry was personally reviewed and demonstrates: 2.21.22 AF with rates in the 70-90's   Relevant CV Studies:  Echocardiogram 02/16/20:  1. LV apex well-visualized without contrast, no mural thrombus  identified. Left ventricular ejection fraction, by estimation, is <20%.  The left ventricle has severely decreased function. The left ventricle  demonstrates global hypokinesis. There is mild  left ventricular  hypertrophy. Left ventricular diastolic function could  not be evaluated. Elevated left ventricular end-diastolic pressure.  2. Right ventricular systolic function is moderately reduced. The right  ventricular size is mildly enlarged. There is moderately elevated   pulmonary artery systolic pressure. The estimated right ventricular  systolic pressure is 21.1 mmHg.  3. Left atrial size was moderately dilated.  4. Right atrial size was severely dilated.  5. Moderate pleural effusion in the left lateral region.  6. The mitral valve is abnormal. Moderate mitral valve regurgitation.  7. Tricuspid valve regurgitation is moderate.  8. The aortic valve is tricuspid. Aortic valve regurgitation is not  visualized. Mild aortic valve sclerosis is present, with no evidence of  aortic valve stenosis.  9. The pulmonic valve was abnormal.  10. Mildly dilated pulmonary artery.  11. The inferior vena cava is dilated in size with <50% respiratory  variability, suggesting right atrial pressure of 15 mmHg.   Echocardiogram 09/04/20:  1. Left ventricular ejection fraction, by estimation, is 20%. The left  ventricle has severely decreased function. The left ventricle demonstrates  global hypokinesis. There is mild left ventricular hypertrophy. Left  ventricular diastolic parameters are  indeterminate.  2. Right ventricular systolic function is moderately reduced. The right  ventricular size is normal. There is mildly elevated pulmonary artery  systolic pressure. The estimated right ventricular systolic pressure is  94.1 mmHg.  3. The mitral valve is normal in structure. Mild mitral valve  regurgitation. No evidence of mitral stenosis.  4. The aortic valve is normal in structure. Aortic valve regurgitation is  not visualized. Mild aortic valve sclerosis is present, with no evidence  of aortic valve stenosis.  5. The inferior vena cava is dilated in size with <50% respiratory  variability, suggesting right atrial pressure of 15 mmHg.  6. There is a significant left pleural effusion.  7. The patient is in atrial fibrillation.   Laboratory Data:  Chemistry Recent Labs  Lab 02/24/20 1121 02/24/20 1452 02/25/20 0455 02/26/20 0219  NA 141 140 143  144  K 4.5 4.1 3.6 3.4*  CL 103  --  101 100  CO2 25  --  28 28  GLUCOSE 95  --  86 90  BUN 38*  --  41* 36*  CREATININE 2.19*  --  2.18* 2.12*  CALCIUM 9.6  --  9.3 9.4  GFRNONAA 29*  --  29* 30*  ANIONGAP 13  --  14 16*    Total Protein  Date Value Ref Range Status  02/15/2020 6.5 6.5 - 8.1 g/dL Final   Albumin  Date Value Ref Range Status  02/15/2020 3.0 (L) 3.5 - 5.0 g/dL Final   AST  Date Value Ref Range Status  02/15/2020 24 15 - 41 U/L Final   ALT  Date Value Ref Range Status  02/15/2020 23 0 - 44 U/L Final   Alkaline Phosphatase  Date Value Ref Range Status  02/15/2020 69 38 - 126 U/L Final   Total Bilirubin  Date Value Ref Range Status  02/15/2020 1.4 (H) 0.3 - 1.2 mg/dL Final   Hematology Recent Labs  Lab 02/24/20 1121 02/24/20 1452  WBC 7.7  --   RBC 4.84  --   HGB 14.3 13.9  HCT 44.1 41.0  MCV 91.1  --   MCH 29.5  --   MCHC 32.4  --   RDW 14.9  --   PLT 200  --    Cardiac EnzymesNo results for input(s): TROPONINI in the last  168 hours. No results for input(s): TROPIPOC in the last 168 hours.  BNP Recent Labs  Lab 02/24/20 1121  BNP 2,617.0*    DDimer No results for input(s): DDIMER in the last 168 hours. TSH:  Lab Results  Component Value Date   TSH 2.437 02/16/2020   Lipids: Lab Results  Component Value Date   CHOL 120 08/09/2019   HDL 41 08/09/2019   LDLCALC 70 08/09/2019   TRIG 44 08/09/2019   CHOLHDL 2.9 08/09/2019   HgbA1c: Lab Results  Component Value Date   HGBA1C 5.9 (H) 08/09/2019    Radiology/Studies:  DG Chest 2 View  Result Date: 02/24/2020 CLINICAL DATA:  Shortness of breath. EXAM: CHEST - 2 VIEW COMPARISON:  02/15/2020. FINDINGS: 1148 hours. The cardio pericardial silhouette is enlarged. There is pulmonary vascular congestion without overt pulmonary edema. Increasing bibasilar collapse/consolidation with small bilateral pleural effusions. Bones are diffusely demineralized. IMPRESSION: Cardiomegaly with vascular  congestion. Bibasilar atelectasis/infiltrate with small bilateral pleural effusions. Electronically Signed   By: Misty Stanley M.D.   On: 02/24/2020 12:09   Assessment and Plan:   1.  Acute on chronic systolic CHF/acute hypoxic respiratory failure: -Pt has a know hx of systolic HF/cardiomyopathy with a most recent LVEF at <20% per echo 02/16/20 (known). He presented from SNF after recent hospitalization for CHF with worsening LE edema and hypoxic respiratory failure. He was briefly placed on Bipap ventilation for an SpO2 of 76% on presentation. He has been treated with IV Lasix with good response.  -Weight, 169lb today with an admission weight at 182lb on 02/24/20 -I&O, net negative 19.1L -Continue current Lasix dosing and follow response  -No ACEI/ARB/ARNI due to renal disease -Continue beta blocker   2.  Acute on chronic hypoxic respiratory failure: -Per most recent hospital discharge summary, patient was planned to be on 2 L nasal cannula however unclear if patient was utilizing supplemental O2.   -Found to be hypoxic at the SNF facility and transported to the ED for further evaluation.   -Required brief BiPAP ventilation due to persistent hypoxia on presentation.   -Currently with stable saturations on 4L face mask  -Management per IM   3.  Paroxysmal atrial fibrillation: -Has a known hx of PAF>>with Linq placement  -Continue beta-blocker and anticoagulated with Eliquis   4.  CAD with history of NSTEMI: -S/p NSTEMI 10/2018 with severe diagonal and obtuse marginal (thrombotic) disease and otherwise nonobstructive disease.  -Unable to assess anginal symptoms  No acute EKG changes -Continue beta-blocker and statin therapy.    5. Essential hypertension:  -Blood pressure soft on beta-blocker and ARB therapy.  6. Hyperlipidemia:  -LDL of 70, 08/09/19  7. Stage III/IV chronic kidney disease:  -Creatinine above baseline at 2.12 today  -Baseline appears to be in the 1.3-1.5 range  8.   Hypokalemia:  -K+, 3.4 -Replaced per IM   9. Dementia: -Progressive>>unable to assess current health status per patient report   For questions or updates, please contact [facility] Please consult [email or web address] for contact info under Cardiology/STEMI.   SignedKathyrn Drown NP-C [facility] Pager: [phone] 02/26/2020 3:47 PM

## 2020-02-27 DIAGNOSIS — N179 Acute kidney failure, unspecified: Secondary | ICD-10-CM | POA: Diagnosis not present

## 2020-02-27 DIAGNOSIS — F0390 Unspecified dementia without behavioral disturbance: Secondary | ICD-10-CM | POA: Diagnosis not present

## 2020-02-27 DIAGNOSIS — Z515 Encounter for palliative care: Secondary | ICD-10-CM | POA: Diagnosis not present

## 2020-02-27 DIAGNOSIS — I5023 Acute on chronic systolic (congestive) heart failure: Secondary | ICD-10-CM | POA: Diagnosis not present

## 2020-02-27 DIAGNOSIS — I214 Non-ST elevation (NSTEMI) myocardial infarction: Secondary | ICD-10-CM | POA: Diagnosis not present

## 2020-02-27 LAB — BASIC METABOLIC PANEL
Anion gap: 9 (ref 5–15)
BUN: 34 mg/dL — ABNORMAL HIGH (ref 8–23)
CO2: 35 mmol/L — ABNORMAL HIGH (ref 22–32)
Calcium: 9.2 mg/dL (ref 8.9–10.3)
Chloride: 101 mmol/L (ref 98–111)
Creatinine, Ser: 1.84 mg/dL — ABNORMAL HIGH (ref 0.61–1.24)
GFR, Estimated: 35 mL/min — ABNORMAL LOW (ref >60)
Glucose, Bld: 104 mg/dL — ABNORMAL HIGH (ref 70–99)
Potassium: 3.5 mmol/L (ref 3.5–5.1)
Sodium: 145 mmol/L (ref 135–145)

## 2020-02-27 LAB — MAGNESIUM: Magnesium: 2 mg/dL (ref 1.7–2.4)

## 2020-02-27 MED ORDER — TAMSULOSIN HCL 0.4 MG PO CAPS
0.4000 mg | ORAL_CAPSULE | Freq: Every day | ORAL | Status: DC
  Administered 2020-02-27 – 2020-03-02 (×5): 0.4 mg via ORAL
  Filled 2020-02-27 (×5): qty 1

## 2020-02-27 MED ORDER — METOPROLOL SUCCINATE ER 50 MG PO TB24
50.0000 mg | ORAL_TABLET | Freq: Every day | ORAL | Status: DC
  Administered 2020-02-28 – 2020-03-02 (×4): 50 mg via ORAL
  Filled 2020-02-27 (×4): qty 1

## 2020-02-27 NOTE — Plan of Care (Signed)

## 2020-02-27 NOTE — Progress Notes (Signed)
[facility] ([facility]) Community Based Palliative Care       This patient is enrolled in our palliative care services in the community.  [facility] will continue to follow for any discharge planning needs and to coordinate continuation of palliative care.       Thank you for the opportunity to participate in this patient's care.     Chrislyn King, BSN, RN [facility] Hospital Liaison   [phone] (24 h on call)  

## 2020-02-27 NOTE — Progress Notes (Signed)
Daily Progress Note   Patient Name: Brian Brown       Date: 02/27/2020 DOB: Feb 08, 1932  Age: 87 y.o. MRN#: [id number] Attending Physician: Aline August, MD Primary Care Physician: Seward Carol, MD Admit Date: 02/24/2020  Reason for Follow-up: continued GOC discussion  Subjective: 16:45--Patient is not able to answer questions appropriately. He has required bilateral soft mittens for some mild agitation.   18:18--I had a lengthy discussion with daughters Ples Specter, and Helene Kelp by phone. Provided an update on recommendations from the heart failure team that he is diuresing well and plan of care is to continue diuresis.  Spent time providing education about heart failure, emphasizing that it is a chronic and progressive disease that gets worse over time. Discussed that his LVEF is less than 20%, meaning his heart failure is advanced (end-stage). Discussed that he had a significant exacerbation this hospitalization with acute hypoxic respiratory failure. Provided education that exacerbations will continue to be recurrent and will likely occur more frequently, referencing that he was just hospitalized earlier this month 2/10--2/15. Reviewed that he has chronic kidney disease as well, and that the diuretic medication used to treat his heart failure unfortunately can worsen kidney function.   Briefly discussed code status. Encouraged family to consider DNR/DNI status understanding evidenced based poor outcomes in similar hospitalized patients, as the cause of the arrest is likely associated with chronic/terminal disease rather than a reversible acute cardio-pulmonary event. Daughters share that patient had previously expressed that he would want to be resuscitated and they are not yet ready to  change code status.   I explained that DNR/DNI does not change the medical plan and it only comes into effect after a person has arrested (died).  It is a protective measure to keep [place] from harming the patient in their last moments of life. Patient/family were not agreeable to DNR/DNI with understanding that full code includes CPR, defibrillation, ACLS medications, and intubation. They understand  prognosis would be poor in the event of a resuscitation event, but are clear in desire to continue full code status.   Daughters express concern over patient's mental status, particularly that an acute change seemed to occur on Saturday. They are especially concerned he may have had another stroke. I provided reassurance that this was unlikely, as he did not have other symptoms  associated with stroke. Discussed other causes for his mental status, including dementia and/or acute delirium. Provided education on the trajectory of dementia, emphasizing that it is progressive disease that unfortunately gets worse over time. Also discussed that patients with dementia often have worsening mental status during an acute illness, especially when they are hospitalized and away from a familiar environment.   Daughters also express concern about patient going to rehab. They share that the experience with [facility] rehab was not positive and they would definitely not want him to return there. They ask what purpose it will serve for him to go back to rehab. I stated that it depends on the overall goal of care, but many patients with advanced disease processes do not do well in rehab. Daughters share that the ultimate goal would be to have patient return home with them (they live together).  I then let them know that patient would be eligible to receive hospice services in the home. Discussed the many benefits of hospice including symptom management, providing DME, and improved quality of life. Daughters share that they cared  for their mother at end of life utilizing hospice services at home. One daughter states "no one can care for our father like we can". Discussed that it is a gift for a patient to be able to be at home at end of life.    Length of Stay: 3  Current Medications: Scheduled Meds:  . apixaban  2.5 mg Oral BID  . atorvastatin  40 mg Oral Daily  . Chlorhexidine Gluconate Cloth  6 each Topical Daily  . famotidine  20 mg Oral QHS  . furosemide  40 mg Intravenous Q12H  . latanoprost  1 drop Both Eyes QHS  . [START ON 02/28/2020] metoprolol succinate  50 mg Oral Daily  . metoprolol tartrate  25 mg Oral BID  . omega-3 acid ethyl esters  1 g Oral Daily  . potassium chloride  20 mEq Oral Daily  . sodium chloride flush  3 mL Intravenous Q12H  . tamsulosin  0.4 mg Oral Daily  . cyanocobalamin  100 mcg Oral Daily     PRN Meds: acetaminophen, hydrALAZINE, nitroGLYCERIN, OLANZapine zydis, sodium chloride flush  Physical Exam Constitutional:      General: He is not in acute distress.    Appearance: He is ill-appearing.  Cardiovascular:     Rate and Rhythm: Normal rate. Rhythm irregularly irregular.  Pulmonary:     Effort: Pulmonary effort is normal.  Neurological:     Mental Status: He is alert. He is confused.  Psychiatric:        Cognition and Memory: Cognition is impaired.             Vital Signs: BP 118/84 (BP Location: Right Arm)   Pulse 77   Temp 97.9 F (36.6 C) (Oral)   Resp 15   Ht 5\' 9"  (1.753 m)   Wt 72.5 kg   SpO2 100%   BMI 23.61 kg/m  SpO2: SpO2: 100 % O2 Device: O2 Device: Nasal Cannula O2 Flow Rate: O2 Flow Rate (L/min): 2 L/min  Intake/output summary:   Intake/Output Summary (Last 24 hours) at 02/27/2020 1903 Last data filed at 02/27/2020 1829 Gross per 24 hour  Intake 240 ml  Output 3060 ml  Net -2820 ml   LBM: Last BM Date: 02/25/20 Baseline Weight: Weight: 83 kg Most recent weight: Weight: 72.5 kg       Palliative Assessment/Data: PPS  40%  Palliative Care Assessment & Plan   HPI/Patient Profile: 87 y.o. male  with past medical history of CAD, NSTEMI, CHF with EF 20%, atrial fibrillation, CVA with residual right sided weakness, ICH, prostate cancer, CKD, and dementia presented to the ED on 02/24/20 from [facility] with complaints of leg swelling and shortness of breath. Patient was recently hospitalized from 2/10-2/15 for CHF exacerbation. He is admitted on 02/24/2020 with acute on chronic CHF exacerbation, acute on chronic respiratory failure with hypoxia, and acute on chronic CKD.  Assessment: - acute on chronic systolic heart failure (EF < 20%) - acute on chronic hypoxic respiratory failure - acute on chronic kidney disease stage IIIb - paroxysmal A-fib - CAD with history of NSTEMI - dementia  Recommendations/Plan:  Full code full scope treatment  Continue current medical care  Family is considering home hospice  Will need ongoing discussion regarding code status  Conference call with daughter tomorrow at 1:30pm  Goals of Care and Additional Recommendations:  Limitations on Scope of Treatment: Full Scope Treatment  Code Status:  Full  Prognosis:   < 6 months  Discharge Planning:  To Be Determined  Care plan was discussed with Dr. Starla Link  Thank you for allowing the Palliative Medicine Team to assist in the care of this patient.   Total Time 66 minutes Prolonged Time Billed  yes       Greater than 50%  of this time was spent counseling and coordinating care related to the above assessment and plan.  Lavena Bullion, NP  Please contact Palliative Medicine Team phone at [phone] for questions and concerns.

## 2020-02-27 NOTE — Progress Notes (Signed)
Patient ID: Brian Brown, male   DOB: 1932/06/16, 87 y.o.   MRN: [id number]  PROGRESS NOTE    Brian Brown  QPR:[id number] DOB: [birth year] DOA: 02/24/2020 PCP: Seward Carol, MD   Brief Narrative:  87 year old male with history of CAD, non-STEMI, chronic systolic CHF with EF of 99%, paroxysmal A. fib on Eliquis, unspecified CVA with residual right-sided weakness, ICH, hypertension, recent admission from 02/15/20-02/20/20 for CHF exacerbation and subsequently discharged to SNF presented with worsening dyspnea. On presentation, he was hypoxic requiring supplemental oxygen. He was started on intravenous Lasix. Chest x-ray showed pulmonary vascular congestion. Cardiology and palliative care were consulted.  Assessment & Plan:   Acute on chronic systolic heart failure -Echo on 2020-02-16 showed EF of 20%. -Cardiology following and managing diuretics. Currently on Lasix 40 mg IVevery 12 hours continue metoprolol. Strict input and output. Daily weights. Fluid restriction. Negative balance of 22,255.6 cc since admission  Acute on chronic hypoxic respiratory failure -Patient was supposed to be discharged on 3 L oxygen. He presented very hypoxic and required 15 L oxygen at some point -Respirate status improving. Currently on 2 L oxygen. Wean off as able  Acute on chronic kidney disease stage IIIb -Baseline creatinine of 1.3-1.4 -Creatinine improving; 1.84 today. Monitor  Paroxysmal A. Fib -Currently rate controlled. Continue Eliquis and metoprolol  CAD with history of non-STEMI -Stable. Continue beta-blocker and statin; cardiology following  Hypertension -Blood pressure stable. Continue beta-blocker  Hyperlipidemia -Continue statin  Hypokalemia -Continue replacement. Monitor  Dementia with memory deficits -Fall precautions. -Palliative care following for goals of care discussion. Currently still receiving a full code.  History of unspecified CVA/ICH -Continue  statin/anticoagulation  DVT prophylaxis: Eliquis Code Status: Full Family Communication: None Disposition Plan: Status is: Inpatient  Remains inpatient appropriate because:Inpatient level of care appropriate due to severity of illness   Dispo: The patient is from: SNF              Anticipated d/c is to: SNF              Anticipated d/c date is: 3 days              Patient currently is not medically stable to d/c.   Difficult to place patient No  Consultants: Cardiology/palliative care  Procedures: None  Antimicrobials: None   Subjective: Patient seen and examined at bedside. Awake, hardly participates in any conversation. No overnight fever, vomiting or worsening shortness of breath reported  Objective: Vitals:   02/27/20 0316 02/27/20 0825 02/27/20 1007 02/27/20 1044  BP: (!) 121/8 (!) 125/98 103/79 103/79  Pulse: 78 87 84   Resp: 16   15  Temp: 97.9 F (36.6 C) 97.7 F (36.5 C)  97.7 F (36.5 C)  TempSrc: Oral Oral  Oral  SpO2: 100%   100%  Weight: 72.5 kg     Height:        Intake/Output Summary (Last 24 hours) at 02/27/2020 1321 Last data filed at 02/27/2020 0644 Gross per 24 hour  Intake 240 ml  Output 3350 ml  Net -3110 ml   Filed Weights   02/25/20 0440 02/26/20 0355 02/27/20 0316  Weight: 81.1 kg 76.9 kg 72.5 kg    Examination:  General exam: Appears calm and comfortable. Looks chronically ill. Currently on room air. Respiratory system: Bilateral decreased breath sounds at bases with scattered crackles Cardiovascular system: S1 & S2 heard, Rate controlled Gastrointestinal system: Abdomen is nondistended, soft and nontender. Normal bowel sounds heard. Extremities: No cyanosis,  clubbing; lower extremity edema present  Central nervous system: Awake, hardly participates in any conversation. Confused; no focal neurological deficits. Moving extremities Skin: No rashes, lesions or ulcers Psychiatry: could not be assessed because of mental  status    Data Reviewed: I have personally reviewed following labs and imaging studies  CBC: Recent Labs  Lab 02/24/20 1121 02/24/20 1452  WBC 7.7  --   HGB 14.3 13.9  HCT 44.1 41.0  MCV 91.1  --   PLT 200  --    Basic Metabolic Panel: Recent Labs  Lab 02/24/20 1121 02/24/20 1452 02/25/20 0455 02/26/20 0219 02/27/20 0229  NA 141 140 143 144 145  K 4.5 4.1 3.6 3.4* 3.5  CL 103  --  101 100 101  CO2 25  --  28 28 35*  GLUCOSE 95  --  86 90 104*  BUN 38*  --  41* 36* 34*  CREATININE 2.19*  --  2.18* 2.12* 1.84*  CALCIUM 9.6  --  9.3 9.4 9.2  MG  --   --   --   --  2.0   GFR: Estimated Creatinine Clearance: 29.4 mL/min (A) (by C-G formula based on SCr of 1.84 mg/dL (H)). Liver Function Tests: No results for input(s): AST, ALT, ALKPHOS, BILITOT, PROT, ALBUMIN in the last 168 hours. No results for input(s): LIPASE, AMYLASE in the last 168 hours. No results for input(s): AMMONIA in the last 168 hours. Coagulation Profile: No results for input(s): INR, PROTIME in the last 168 hours. Cardiac Enzymes: No results for input(s): CKTOTAL, CKMB, CKMBINDEX, TROPONINI in the last 168 hours. BNP (last 3 results) No results for input(s): PROBNP in the last 8760 hours. HbA1C: No results for input(s): HGBA1C in the last 72 hours. CBG: Recent Labs  Lab 02/25/20 0749  GLUCAP 99   Lipid Profile: No results for input(s): CHOL, HDL, LDLCALC, TRIG, CHOLHDL, LDLDIRECT in the last 72 hours. Thyroid Function Tests: No results for input(s): TSH, T4TOTAL, FREET4, T3FREE, THYROIDAB in the last 72 hours. Anemia Panel: No results for input(s): VITAMINB12, FOLATE, FERRITIN, TIBC, IRON, RETICCTPCT in the last 72 hours. Sepsis Labs: No results for input(s): PROCALCITON, LATICACIDVEN in the last 168 hours.  Recent Results (from the past 240 hour(s))  SARS CORONAVIRUS 2 (TAT 6-24 HRS) Nasopharyngeal Nasopharyngeal Swab     Status: None   Collection Time: 02/19/20 12:01 PM   Specimen:  Nasopharyngeal Swab  Result Value Ref Range Status   SARS Coronavirus 2 NEGATIVE NEGATIVE Final    Comment: (NOTE) SARS-CoV-2 target nucleic acids are NOT DETECTED.  The SARS-CoV-2 RNA is generally detectable in upper and lower respiratory specimens during the acute phase of infection. Negative results do not preclude SARS-CoV-2 infection, do not rule out co-infections with other pathogens, and should not be used as the sole basis for treatment or other patient management decisions. Negative results must be combined with clinical observations, patient history, and epidemiological information. The expected result is Negative.  Fact Sheet for Patients: SugarRoll.be  Fact Sheet for Healthcare Providers: https://www.woods-mathews.com/  This test is not yet approved or cleared by the [place] FDA and  has been authorized for detection and/or diagnosis of SARS-CoV-2 by FDA under an Emergency Use Authorization (EUA). This EUA will remain  in effect (meaning this test can be used) for the duration of the COVID-19 declaration under Se ction 564(b)(1) of the Act, 21 U.S.C. section 360bbb-3(b)(1), unless the authorization is terminated or revoked sooner.  Performed at [facility],  [address] [place], [place] [address]   Resp Panel by RT-PCR (Flu A&B, Covid) Nasopharyngeal Swab     Status: None   Collection Time: 02/24/20 12:53 PM   Specimen: Nasopharyngeal Swab; Nasopharyngeal(NP) swabs in vial transport medium  Result Value Ref Range Status   SARS Coronavirus 2 by RT PCR NEGATIVE NEGATIVE Final    Comment: (NOTE) SARS-CoV-2 target nucleic acids are NOT DETECTED.  The SARS-CoV-2 RNA is generally detectable in upper respiratory specimens during the acute phase of infection. The lowest concentration of SARS-CoV-2 viral copies this assay can detect is 138 copies/mL. A negative result does not preclude SARS-Cov-2 infection and  should not be used as the sole basis for treatment or other patient management decisions. A negative result may occur with  improper specimen collection/handling, submission of specimen other than nasopharyngeal swab, presence of viral mutation(s) within the areas targeted by this assay, and inadequate number of viral copies(<138 copies/mL). A negative result must be combined with clinical observations, patient history, and epidemiological information. The expected result is Negative.  Fact Sheet for Patients:  EntrepreneurPulse.com.au  Fact Sheet for Healthcare Providers:  IncredibleEmployment.be  This test is no t yet approved or cleared by the [place] FDA and  has been authorized for detection and/or diagnosis of SARS-CoV-2 by FDA under an Emergency Use Authorization (EUA). This EUA will remain  in effect (meaning this test can be used) for the duration of the COVID-19 declaration under Section 564(b)(1) of the Act, 21 U.S.C.section 360bbb-3(b)(1), unless the authorization is terminated  or revoked sooner.       Influenza A by PCR NEGATIVE NEGATIVE Final   Influenza B by PCR NEGATIVE NEGATIVE Final    Comment: (NOTE) The Xpert Xpress SARS-CoV-2/FLU/RSV plus assay is intended as an aid in the diagnosis of influenza from Nasopharyngeal swab specimens and should not be used as a sole basis for treatment. Nasal washings and aspirates are unacceptable for Xpert Xpress SARS-CoV-2/FLU/RSV testing.  Fact Sheet for Patients: EntrepreneurPulse.com.au  Fact Sheet for Healthcare Providers: IncredibleEmployment.be  This test is not yet approved or cleared by the [place] FDA and has been authorized for detection and/or diagnosis of SARS-CoV-2 by FDA under an Emergency Use Authorization (EUA). This EUA will remain in effect (meaning this test can be used) for the duration of the COVID-19 declaration  under Section 564(b)(1) of the Act, 21 U.S.C. section 360bbb-3(b)(1), unless the authorization is terminated or revoked.  Performed at [facility], [place] [address], [place], [place] [address]          Radiology Studies: No results found.      Scheduled Meds: . apixaban  2.5 mg Oral BID  . atorvastatin  40 mg Oral Daily  . Chlorhexidine Gluconate Cloth  6 each Topical Daily  . famotidine  20 mg Oral QHS  . furosemide  40 mg Intravenous Q12H  . latanoprost  1 drop Both Eyes QHS  . metoprolol tartrate  25 mg Oral BID  . omega-3 acid ethyl esters  1 g Oral Daily  . potassium chloride  20 mEq Oral Daily  . sodium chloride flush  3 mL Intravenous Q12H  . cyanocobalamin  100 mcg Oral Daily   Continuous Infusions:        Aline August, MD [facility] 02/27/2020, 1:21 PM

## 2020-02-27 NOTE — Progress Notes (Signed)
Progress Note  Patient Name: Brian Brown Date of Encounter: 02/27/2020  Primary Cardiologist: Larae Grooms, MD  Subjective   More alert today, answering questions appropriately.   Inpatient Medications    Scheduled Meds:  apixaban  2.5 mg Oral BID   atorvastatin  40 mg Oral Daily   Chlorhexidine Gluconate Cloth  6 each Topical Daily   famotidine  20 mg Oral QHS   furosemide  40 mg Intravenous Q12H   latanoprost  1 drop Both Eyes QHS   metoprolol tartrate  25 mg Oral BID   omega-3 acid ethyl esters  1 g Oral Daily   potassium chloride  20 mEq Oral Daily   sodium chloride flush  3 mL Intravenous Q12H   cyanocobalamin  100 mcg Oral Daily   Continuous Infusions:  PRN Meds: acetaminophen, hydrALAZINE, nitroGLYCERIN, OLANZapine zydis, sodium chloride flush   Vital Signs    Vitals:   02/26/20 2031 02/26/20 2208 02/27/20 0033 02/27/20 0316  BP: 124/87  112/76 (!) 121/8  Pulse:  91  78  Resp: 12  20 16   Temp: 97.8 F (36.6 C)  97.9 F (36.6 C) 97.9 F (36.6 C)  TempSrc: Oral  Oral Oral  SpO2: 100%  99% 100%  Weight:    72.5 kg  Height:        Intake/Output Summary (Last 24 hours) at 02/27/2020 0743 Last data filed at 02/27/2020 0644 Gross per 24 hour  Intake 388.37 ml  Output 4250 ml  Net -3861.63 ml   Filed Weights   02/25/20 0440 02/26/20 0355 02/27/20 0316  Weight: 81.1 kg 76.9 kg 72.5 kg    Physical Exam   General: Elderly, NAD Neck: Negative for carotid bruits. No JVD Lungs:Clear to ausculation bilaterally. No wheezes, rales, or rhonchi. Breathing is unlabored. Cardiovascular: Irregularly irregular. No murmurs, rubs, gallops, or LV heave appreciated. Abdomen: Soft, non-tender, non-distended. No obvious abdominal masses. Extremities: 1-2+ BLE edema.Radial  pulses 2+ bilaterally Neuro: Alert and oriented to person and place. Psych: Responds to questions appropriately with normal affect.    Labs    Chemistry Recent Labs   Lab 02/25/20 0455 02/26/20 0219 02/27/20 0229  NA 143 144 145  K 3.6 3.4* 3.5  CL 101 100 101  CO2 28 28 35*  GLUCOSE 86 90 104*  BUN 41* 36* 34*  CREATININE 2.18* 2.12* 1.84*  CALCIUM 9.3 9.4 9.2  GFRNONAA 29* 30* 35*  ANIONGAP 14 16* 9     Hematology Recent Labs  Lab 02/24/20 1121 02/24/20 1452  WBC 7.7  --   RBC 4.84  --   HGB 14.3 13.9  HCT 44.1 41.0  MCV 91.1  --   MCH 29.5  --   MCHC 32.4  --   RDW 14.9  --   PLT 200  --     Cardiac EnzymesNo results for input(s): TROPONINI in the last 168 hours. No results for input(s): TROPIPOC in the last 168 hours.   BNP Recent Labs  Lab 02/24/20 1121  BNP 2,617.0*     DDimer No results for input(s): DDIMER in the last 168 hours.   Radiology    No results found.  Telemetry    02/27/20 AF with rates in the 70's- Personally Reviewed  ECG    No new tracing as of 02/27/20- Personally Reviewed  Cardiac Studies   Echocardiogram 02/16/20:  1. LV apex well-visualized without contrast, no mural thrombus  identified. Left ventricular ejection fraction, by estimation, is <20%.  The left ventricle has severely decreased function. The left ventricle  demonstrates global hypokinesis. There is mild  left ventricular hypertrophy. Left ventricular diastolic function could  not be evaluated. Elevated left ventricular end-diastolic pressure.  2. Right ventricular systolic function is moderately reduced. The right  ventricular size is mildly enlarged. There is moderately elevated  pulmonary artery systolic pressure. The estimated right ventricular  systolic pressure is 10.9 mmHg.  3. Left atrial size was moderately dilated.  4. Right atrial size was severely dilated.  5. Moderate pleural effusion in the left lateral region.  6. The mitral valve is abnormal. Moderate mitral valve regurgitation.  7. Tricuspid valve regurgitation is moderate.  8. The aortic valve is tricuspid. Aortic valve regurgitation is not   visualized. Mild aortic valve sclerosis is present, with no evidence of  aortic valve stenosis.  9. The pulmonic valve was abnormal.  10. Mildly dilated pulmonary artery.  11. The inferior vena cava is dilated in size with <50% respiratory  variability, suggesting right atrial pressure of 15 mmHg.   Echocardiogram 09/04/20:  1. Left ventricular ejection fraction, by estimation, is 20%. The left  ventricle has severely decreased function. The left ventricle demonstrates  global hypokinesis. There is mild left ventricular hypertrophy. Left  ventricular diastolic parameters are  indeterminate.  2. Right ventricular systolic function is moderately reduced. The right  ventricular size is normal. There is mildly elevated pulmonary artery  systolic pressure. The estimated right ventricular systolic pressure is  32.3 mmHg.  3. The mitral valve is normal in structure. Mild mitral valve  regurgitation. No evidence of mitral stenosis.  4. The aortic valve is normal in structure. Aortic valve regurgitation is  not visualized. Mild aortic valve sclerosis is present, with no evidence  of aortic valve stenosis.  5. The inferior vena cava is dilated in size with <50% respiratory  variability, suggesting right atrial pressure of 15 mmHg.  6. There is a significant left pleural effusion.  7. The patient is in atrial fibrillation.   Patient Profile     87 y.o. 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 Benny Lennert.  Assessment & Plan    1.  Acute on chronic systolic CHF/acute hypoxic respiratory failure: -Pt has a know hx of systolic HF/cardiomyopathy with a most recent LVEF at <20% per echo 02/16/20 (known).  -Presented from SNF  after recent hospitalization for CHF with worsening LE edema and hypoxic respiratory failure. He was briefly placed on Bipap ventilation for an SpO2 of 76% on presentation. He has been treated with IV Lasix with good response.  -Weight, 159.9lb today with an admission weight at 182lb on 02/24/20 -I&O, net negative 22.2L -Continue current Lasix dosing and follow response>>>creatinine improved from yesterday  -No ACEI/ARB/ARNI due to renal disease -Continue beta blocker   2.  Acute on chronic hypoxic respiratory failure: -Per most recent hospital discharge summary, patient was planned to be on 2 L nasal cannula however unclear if patient was utilizing supplemental O2.   -Found to be hypoxic at the SNF facility and transported to the ED for further evaluation.   -Required brief BiPAP ventilation due to persistent hypoxia on presentation.   -Currently with stable saturations on 2L [place]   -Management per  IM   3.  Paroxysmal atrial fibrillation: -Has a known hx of PAF>>with Linq placement  -Continue beta-blocker and anticoagulated with Eliquis   4.  CAD with history of NSTEMI: -S/p NSTEMI 10/2018 with severe diagonal and obtuse marginal (thrombotic) disease and otherwise nonobstructive disease.  -Unable to assess anginal symptoms  No acute EKG changes -Continue beta-blocker and statin therapy.   5. Essential hypertension:  -Stable, 125/98>>121/81>>112/76 -On beta-blocker and ARB therapy.  6. Hyperlipidemia:  -LDL of 70, 08/09/19  7. Stage III/IV chronic kidney disease:  -Creatinine above baseline at 1.84  Today, down from 2.12 yesterday  -Baseline appears to be in the 1.3-1.5 range  8. Hypokalemia:  -K+, 3.5 -per IM   9. Dementia: -Progressive>>unable to assess current health status per patient report   Signed, Kathyrn Drown NP-C [facility] Pager: [phone] 02/27/2020, 7:43 AM     For questions or updates, please contact   Please consult [email or web address] for contact  info under Cardiology/STEMI.

## 2020-02-28 DIAGNOSIS — I480 Paroxysmal atrial fibrillation: Secondary | ICD-10-CM | POA: Diagnosis not present

## 2020-02-28 DIAGNOSIS — F0390 Unspecified dementia without behavioral disturbance: Secondary | ICD-10-CM | POA: Diagnosis not present

## 2020-02-28 DIAGNOSIS — I5023 Acute on chronic systolic (congestive) heart failure: Secondary | ICD-10-CM | POA: Diagnosis not present

## 2020-02-28 DIAGNOSIS — Z515 Encounter for palliative care: Secondary | ICD-10-CM | POA: Diagnosis not present

## 2020-02-28 DIAGNOSIS — N179 Acute kidney failure, unspecified: Secondary | ICD-10-CM | POA: Diagnosis not present

## 2020-02-28 LAB — MAGNESIUM: Magnesium: 1.9 mg/dL (ref 1.7–2.4)

## 2020-02-28 LAB — CBC WITH DIFFERENTIAL/PLATELET
Abs Immature Granulocytes: 0.03 10*3/uL (ref 0.00–0.07)
Basophils Absolute: 0.1 10*3/uL (ref 0.0–0.1)
Basophils Relative: 1 %
Eosinophils Absolute: 0.3 10*3/uL (ref 0.0–0.5)
Eosinophils Relative: 4 %
HCT: 40.2 % (ref 39.0–52.0)
Hemoglobin: 13.1 g/dL (ref 13.0–17.0)
Immature Granulocytes: 1 %
Lymphocytes Relative: 16 %
Lymphs Abs: 1.1 10*3/uL (ref 0.7–4.0)
MCH: 29.6 pg (ref 26.0–34.0)
MCHC: 32.6 g/dL (ref 30.0–36.0)
MCV: 90.7 fL (ref 80.0–100.0)
Monocytes Absolute: 0.5 10*3/uL (ref 0.1–1.0)
Monocytes Relative: 8 %
Neutro Abs: 4.5 10*3/uL (ref 1.7–7.7)
Neutrophils Relative %: 70 %
Platelets: 185 10*3/uL (ref 150–400)
RBC: 4.43 MIL/uL (ref 4.22–5.81)
RDW: 14.7 % (ref 11.5–15.5)
WBC: 6.5 10*3/uL (ref 4.0–10.5)
nRBC: 0 % (ref 0.0–0.2)

## 2020-02-28 LAB — BASIC METABOLIC PANEL
Anion gap: 12 (ref 5–15)
BUN: 33 mg/dL — ABNORMAL HIGH (ref 8–23)
CO2: 32 mmol/L (ref 22–32)
Calcium: 9 mg/dL (ref 8.9–10.3)
Chloride: 99 mmol/L (ref 98–111)
Creatinine, Ser: 1.7 mg/dL — ABNORMAL HIGH (ref 0.61–1.24)
GFR, Estimated: 39 mL/min — ABNORMAL LOW (ref >60)
Glucose, Bld: 99 mg/dL (ref 70–99)
Potassium: 3.2 mmol/L — ABNORMAL LOW (ref 3.5–5.1)
Sodium: 143 mmol/L (ref 135–145)

## 2020-02-28 MED ORDER — FUROSEMIDE 10 MG/ML IJ SOLN
40.0000 mg | Freq: Two times a day (BID) | INTRAMUSCULAR | Status: DC
  Administered 2020-02-28 – 2020-02-29 (×3): 40 mg via INTRAVENOUS
  Filled 2020-02-28 (×3): qty 4

## 2020-02-28 MED ORDER — POTASSIUM CHLORIDE CRYS ER 20 MEQ PO TBCR
40.0000 meq | EXTENDED_RELEASE_TABLET | Freq: Once | ORAL | Status: AC
  Administered 2020-02-28: 40 meq via ORAL
  Filled 2020-02-28: qty 2

## 2020-02-28 MED ORDER — POTASSIUM CHLORIDE CRYS ER 20 MEQ PO TBCR
40.0000 meq | EXTENDED_RELEASE_TABLET | Freq: Every day | ORAL | Status: DC
  Administered 2020-02-28 – 2020-03-02 (×4): 40 meq via ORAL
  Filled 2020-02-28 (×4): qty 2

## 2020-02-28 MED ORDER — MAGNESIUM SULFATE IN D5W 1-5 GM/100ML-% IV SOLN
1.0000 g | Freq: Once | INTRAVENOUS | Status: AC
  Administered 2020-02-28: 1 g via INTRAVENOUS
  Filled 2020-02-28: qty 100

## 2020-02-28 MED ORDER — POTASSIUM CHLORIDE CRYS ER 10 MEQ PO TBCR
40.0000 meq | EXTENDED_RELEASE_TABLET | Freq: Once | ORAL | Status: AC
  Administered 2020-02-28: 40 meq via ORAL
  Filled 2020-02-28: qty 4

## 2020-02-28 NOTE — Progress Notes (Addendum)
Patient ID: CORDAI RODRIGUE, male   DOB: 1932/08/09, 87 y.o.   MRN: [id number]  PROGRESS NOTE    SEAN MALINOWSKI  BSJ:[id number] DOB: 07-24-1932 DOA: 02/24/2020 PCP: Seward Carol, MD   Brief Narrative:  87 year old male with history of CAD, non-STEMI, chronic systolic CHF with EF of 76%, paroxysmal A. fib on Eliquis, unspecified CVA with residual right-sided weakness, ICH, hypertension, recent admission from 02/15/20-02/20/20 for CHF exacerbation and subsequently discharged to SNF presented with worsening dyspnea. On presentation, he was hypoxic requiring supplemental oxygen. He was started on intravenous Lasix. Chest x-ray showed pulmonary vascular congestion. Cardiology and palliative care were consulted.  Assessment & Plan:   Acute on chronic systolic heart failure -Echo on 2020-02-16 showed EF of 20%. -Cardiology following and managing diuretics. Currently on Lasix 40 mg IVevery 12 hours continue metoprolol. Strict input and output. Daily weights. Fluid restriction. Negative balance of 23,641.1 cc since admission  Acute on chronic hypoxic respiratory failure -Patient was supposed to be discharged on 3 L oxygen. He presented very hypoxic and required 15 L oxygen at some point -Respirate status has much improved.  Currently requiring 2 L oxygen by nasal cannula intermittently.  Wean off as able  Acute on chronic kidney disease stage IIIb -Baseline creatinine of 1.3-1.4 -Creatinine improving; 1.70 today. Monitor  Hypokalemia -Replace.  Repeat a.m. labs  Paroxysmal A. Fib -Currently rate controlled. Continue Eliquis and metoprolol  CAD with history of non-STEMI -Stable. Continue beta-blocker and statin; cardiology following  Hypertension -Blood pressure stable. Continue beta-blocker  Hyperlipidemia -Continue statin  Dementia with memory deficits -Fall precautions. -Palliative care following for goals of care discussion. Currently still receiving a full code.  History of  unspecified CVA/ICH -Continue statin/anticoagulation  DVT prophylaxis: Eliquis Code Status: Full Family Communication: spoke to daughter/Laura on 02/28/2020 on phone Disposition Plan: Status is: Inpatient  Remains inpatient appropriate because:Inpatient level of care appropriate due to severity of illness   Dispo: The patient is from: SNF              Anticipated d/c is to: SNF              Anticipated d/c date is: 2 days              Patient currently is not medically stable to d/c.   Difficult to place patient No  Consultants: Cardiology/palliative care  Procedures: None  Antimicrobials: None   Subjective: Patient seen and examined at bedside.  No worsening shortness of breath, fever, vomiting or chest pain reported.  Awake but pleasantly confused.  Objective: Vitals:   02/27/20 1925 02/27/20 2340 02/28/20 0244 02/28/20 0450  BP: 104/84 102/82 117/89 125/86  Pulse: 89 87  93  Resp: 14 20 14 15   Temp: 97.7 F (36.5 C) 97.7 F (36.5 C)  97.7 F (36.5 C)  TempSrc: Oral Oral  Oral  SpO2: 100% 98% 99% 100%  Weight:    69.9 kg  Height:        Intake/Output Summary (Last 24 hours) at 02/28/2020 0741 Last data filed at 02/28/2020 5465 Gross per 24 hour  Intake 274.52 ml  Output 1660 ml  Net -1385.48 ml   Filed Weights   02/26/20 0355 02/27/20 0316 02/28/20 0450  Weight: 76.9 kg 72.5 kg 69.9 kg    Examination:  General exam: No acute distress.  Looks chronically ill. Currently on 2 L oxygen via nasal cannula intermittently Respiratory system: Decreased breath sounds at bases bilaterally with some crackles Cardiovascular system: Rate  controlled, S1-S2 heard  gastrointestinal system: Abdomen is nondistended, soft and nontender.  Bowel sounds heard  extremities: Bilateral lower extremity edema present; no clubbing Central nervous system: Awake but confused; no focal neurological deficits.  Moves extremities  skin: No obvious lesions/petechiae  psychiatry: Cannot  assess because of mental status    Data Reviewed: I have personally reviewed following labs and imaging studies  CBC: Recent Labs  Lab 02/24/20 1121 02/24/20 1452 02/28/20 0300  WBC 7.7  --  6.5  NEUTROABS  --   --  4.5  HGB 14.3 13.9 13.1  HCT 44.1 41.0 40.2  MCV 91.1  --  90.7  PLT 200  --  793   Basic Metabolic Panel: Recent Labs  Lab 02/24/20 1121 02/24/20 1452 02/25/20 0455 02/26/20 0219 02/27/20 0229 02/28/20 0300  NA 141 140 143 144 145 143  K 4.5 4.1 3.6 3.4* 3.5 3.2*  CL 103  --  101 100 101 99  CO2 25  --  28 28 35* 32  GLUCOSE 95  --  86 90 104* 99  BUN 38*  --  41* 36* 34* 33*  CREATININE 2.19*  --  2.18* 2.12* 1.84* 1.70*  CALCIUM 9.6  --  9.3 9.4 9.2 9.0  MG  --   --   --   --  2.0 1.9   GFR: Estimated Creatinine Clearance: 31.4 mL/min (A) (by C-G formula based on SCr of 1.7 mg/dL (H)). Liver Function Tests: No results for input(s): AST, ALT, ALKPHOS, BILITOT, PROT, ALBUMIN in the last 168 hours. No results for input(s): LIPASE, AMYLASE in the last 168 hours. No results for input(s): AMMONIA in the last 168 hours. Coagulation Profile: No results for input(s): INR, PROTIME in the last 168 hours. Cardiac Enzymes: No results for input(s): CKTOTAL, CKMB, CKMBINDEX, TROPONINI in the last 168 hours. BNP (last 3 results) No results for input(s): PROBNP in the last 8760 hours. HbA1C: No results for input(s): HGBA1C in the last 72 hours. CBG: Recent Labs  Lab 02/25/20 0749  GLUCAP 99   Lipid Profile: No results for input(s): CHOL, HDL, LDLCALC, TRIG, CHOLHDL, LDLDIRECT in the last 72 hours. Thyroid Function Tests: No results for input(s): TSH, T4TOTAL, FREET4, T3FREE, THYROIDAB in the last 72 hours. Anemia Panel: No results for input(s): VITAMINB12, FOLATE, FERRITIN, TIBC, IRON, RETICCTPCT in the last 72 hours. Sepsis Labs: No results for input(s): PROCALCITON, LATICACIDVEN in the last 168 hours.  Recent Results (from the past 240 hour(s))   SARS CORONAVIRUS 2 (TAT 6-24 HRS) Nasopharyngeal Nasopharyngeal Swab     Status: None   Collection Time: 02/19/20 12:01 PM   Specimen: Nasopharyngeal Swab  Result Value Ref Range Status   SARS Coronavirus 2 NEGATIVE NEGATIVE Final    Comment: (NOTE) SARS-CoV-2 target nucleic acids are NOT DETECTED.  The SARS-CoV-2 RNA is generally detectable in upper and lower respiratory specimens during the acute phase of infection. Negative results do not preclude SARS-CoV-2 infection, do not rule out co-infections with other pathogens, and should not be used as the sole basis for treatment or other patient management decisions. Negative results must be combined with clinical observations, patient history, and epidemiological information. The expected result is Negative.  Fact Sheet for Patients: SugarRoll.be  Fact Sheet for Healthcare Providers: https://www.woods-mathews.com/  This test is not yet approved or cleared by the [place] FDA and  has been authorized for detection and/or diagnosis of SARS-CoV-2 by FDA under an Emergency Use Authorization (EUA). This EUA will remain  in effect (meaning this test can be used) for the duration of the COVID-19 declaration under Se ction 564(b)(1) of the Act, 21 U.S.C. section 360bbb-3(b)(1), unless the authorization is terminated or revoked sooner.  Performed at [facility], [place] [address], [place], [place] [address]   Resp Panel by RT-PCR (Flu A&B, Covid) Nasopharyngeal Swab     Status: None   Collection Time: 02/24/20 12:53 PM   Specimen: Nasopharyngeal Swab; Nasopharyngeal(NP) swabs in vial transport medium  Result Value Ref Range Status   SARS Coronavirus 2 by RT PCR NEGATIVE NEGATIVE Final    Comment: (NOTE) SARS-CoV-2 target nucleic acids are NOT DETECTED.  The SARS-CoV-2 RNA is generally detectable in upper respiratory specimens during the acute phase of infection. The  lowest concentration of SARS-CoV-2 viral copies this assay can detect is 138 copies/mL. A negative result does not preclude SARS-Cov-2 infection and should not be used as the sole basis for treatment or other patient management decisions. A negative result may occur with  improper specimen collection/handling, submission of specimen other than nasopharyngeal swab, presence of viral mutation(s) within the areas targeted by this assay, and inadequate number of viral copies(<138 copies/mL). A negative result must be combined with clinical observations, patient history, and epidemiological information. The expected result is Negative.  Fact Sheet for Patients:  EntrepreneurPulse.com.au  Fact Sheet for Healthcare Providers:  IncredibleEmployment.be  This test is no t yet approved or cleared by the [place] FDA and  has been authorized for detection and/or diagnosis of SARS-CoV-2 by FDA under an Emergency Use Authorization (EUA). This EUA will remain  in effect (meaning this test can be used) for the duration of the COVID-19 declaration under Section 564(b)(1) of the Act, 21 U.S.C.section 360bbb-3(b)(1), unless the authorization is terminated  or revoked sooner.       Influenza A by PCR NEGATIVE NEGATIVE Final   Influenza B by PCR NEGATIVE NEGATIVE Final    Comment: (NOTE) The Xpert Xpress SARS-CoV-2/FLU/RSV plus assay is intended as an aid in the diagnosis of influenza from Nasopharyngeal swab specimens and should not be used as a sole basis for treatment. Nasal washings and aspirates are unacceptable for Xpert Xpress SARS-CoV-2/FLU/RSV testing.  Fact Sheet for Patients: EntrepreneurPulse.com.au  Fact Sheet for Healthcare Providers: IncredibleEmployment.be  This test is not yet approved or cleared by the [place] FDA and has been authorized for detection and/or diagnosis of SARS-CoV-2 by FDA under  an Emergency Use Authorization (EUA). This EUA will remain in effect (meaning this test can be used) for the duration of the COVID-19 declaration under Section 564(b)(1) of the Act, 21 U.S.C. section 360bbb-3(b)(1), unless the authorization is terminated or revoked.  Performed at [facility], [place] [address], [place], [place] [address]          Radiology Studies: No results found.      Scheduled Meds: . apixaban  2.5 mg Oral BID  . atorvastatin  40 mg Oral Daily  . Chlorhexidine Gluconate Cloth  6 each Topical Daily  . famotidine  20 mg Oral QHS  . furosemide  40 mg Intravenous Q12H  . latanoprost  1 drop Both Eyes QHS  . metoprolol succinate  50 mg Oral Daily  . metoprolol tartrate  25 mg Oral BID  . omega-3 acid ethyl esters  1 g Oral Daily  . potassium chloride  20 mEq Oral Daily  . potassium chloride  40 mEq Oral Once  . sodium chloride flush  3 mL  Intravenous Q12H  . tamsulosin  0.4 mg Oral Daily  . cyanocobalamin  100 mcg Oral Daily   Continuous Infusions:        Aline August, MD [facility] 02/28/2020, 7:41 AM

## 2020-02-28 NOTE — Progress Notes (Signed)
Progress Note  Patient Name: Brian Brown Date of Encounter: 02/28/2020  Primary Cardiologist: Larae Grooms, MD  Subjective   Open eyes to loud voice. No complaints.   Inpatient Medications    Scheduled Meds:  apixaban  2.5 mg Oral BID   atorvastatin  40 mg Oral Daily   Chlorhexidine Gluconate Cloth  6 each Topical Daily   famotidine  20 mg Oral QHS   furosemide  40 mg Intravenous Q12H   latanoprost  1 drop Both Eyes QHS   metoprolol succinate  50 mg Oral Daily   metoprolol tartrate  25 mg Oral BID   omega-3 acid ethyl esters  1 g Oral Daily   potassium chloride  20 mEq Oral Daily   sodium chloride flush  3 mL Intravenous Q12H   tamsulosin  0.4 mg Oral Daily   cyanocobalamin  100 mcg Oral Daily   Continuous Infusions:  magnesium sulfate bolus IVPB 1 g (02/28/20 0608)   PRN Meds: acetaminophen, hydrALAZINE, nitroGLYCERIN, OLANZapine zydis, sodium chloride flush   Vital Signs    Vitals:   02/27/20 1925 02/27/20 2340 02/28/20 0244 02/28/20 0450  BP: 104/84 102/82 117/89 125/86  Pulse: 89 87  93  Resp: 14 20 14 15   Temp: 97.7 F (36.5 C) 97.7 F (36.5 C)  97.7 F (36.5 C)  TempSrc: Oral Oral  Oral  SpO2: 100% 98% 99% 100%  Weight:    69.9 kg  Height:        Intake/Output Summary (Last 24 hours) at 02/28/2020 0631 Last data filed at 02/28/2020 6761 Gross per 24 hour  Intake 274.52 ml  Output 2710 ml  Net -2435.48 ml   Filed Weights   02/26/20 0355 02/27/20 0316 02/28/20 0450  Weight: 76.9 kg 72.5 kg 69.9 kg    Physical Exam   General: Elderly, NAD Neck: Negative for carotid bruits. No JVD Lungs:Clear to ausculation bilaterally. No wheezes, rales, or rhonchi. Breathing is unlabored. Cardiovascular: RRR with S1 S2. No murmurs Abdomen: Soft, non-tender, non-distended. No obvious abdominal masses. Extremities: No edema. Radial pulses 2+ bilaterally Neuro: Alert to loud voice. No focal deficits. No facial asymmetry. MAE  spontaneously.  Labs    Chemistry Recent Labs  Lab 02/26/20 0219 02/27/20 0229 02/28/20 0300  NA 144 145 143  K 3.4* 3.5 3.2*  CL 100 101 99  CO2 28 35* 32  GLUCOSE 90 104* 99  BUN 36* 34* 33*  CREATININE 2.12* 1.84* 1.70*  CALCIUM 9.4 9.2 9.0  GFRNONAA 30* 35* 39*  ANIONGAP 16* 9 12     Hematology Recent Labs  Lab 02/24/20 1121 02/24/20 1452 02/28/20 0300  WBC 7.7  --  6.5  RBC 4.84  --  4.43  HGB 14.3 13.9 13.1  HCT 44.1 41.0 40.2  MCV 91.1  --  90.7  MCH 29.5  --  29.6  MCHC 32.4  --  32.6  RDW 14.9  --  14.7  PLT 200  --  185    Cardiac EnzymesNo results for input(s): TROPONINI in the last 168 hours. No results for input(s): TROPIPOC in the last 168 hours.   BNP Recent Labs  Lab 02/24/20 1121  BNP 2,617.0*     DDimer No results for input(s): DDIMER in the last 168 hours.   Radiology    No results found.  Telemetry    02/28/20 AF with rate control - Personally Reviewed  ECG    No new tracing as of 02/28/20- Personally Reviewed  Cardiac Studies   Echocardiogram 02/16/20:  1. LV apex well-visualized without contrast, no mural thrombus  identified. Left ventricular ejection fraction, by estimation, is <20%.  The left ventricle has severely decreased function. The left ventricle  demonstrates global hypokinesis. There is mild  left ventricular hypertrophy. Left ventricular diastolic function could  not be evaluated. Elevated left ventricular end-diastolic pressure.  2. Right ventricular systolic function is moderately reduced. The right  ventricular size is mildly enlarged. There is moderately elevated  pulmonary artery systolic pressure. The estimated right ventricular  systolic pressure is 01.7 mmHg.  3. Left atrial size was moderately dilated.  4. Right atrial size was severely dilated.  5. Moderate pleural effusion in the left lateral region.  6. The mitral valve is abnormal. Moderate mitral valve regurgitation.  7. Tricuspid  valve regurgitation is moderate.  8. The aortic valve is tricuspid. Aortic valve regurgitation is not  visualized. Mild aortic valve sclerosis is present, with no evidence of  aortic valve stenosis.  9. The pulmonic valve was abnormal.  10. Mildly dilated pulmonary artery.  11. The inferior vena cava is dilated in size with <50% respiratory  variability, suggesting right atrial pressure of 15 mmHg.   Echocardiogram 09/04/20:  1. Left ventricular ejection fraction, by estimation, is 20%. The left  ventricle has severely decreased function. The left ventricle demonstrates  global hypokinesis. There is mild left ventricular hypertrophy. Left  ventricular diastolic parameters are  indeterminate.  2. Right ventricular systolic function is moderately reduced. The right  ventricular size is normal. There is mildly elevated pulmonary artery  systolic pressure. The estimated right ventricular systolic pressure is  51.0 mmHg.  3. The mitral valve is normal in structure. Mild mitral valve  regurgitation. No evidence of mitral stenosis.  4. The aortic valve is normal in structure. Aortic valve regurgitation is  not visualized. Mild aortic valve sclerosis is present, with no evidence  of aortic valve stenosis.  5. The inferior vena cava is dilated in size with <50% respiratory  variability, suggesting right atrial pressure of 15 mmHg.  6. There is a significant left pleural effusion.  7. The patient is in atrial fibrillation.  Patient Profile     87 y.o. 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 Swayze.  Assessment & Plan    1.Acute on chronic systolic CHF/acute hypoxic respiratory failure: -Pt has a know hx of  systolic HF/cardiomyopathy with a most recent LVEF at <20% per echo 02/16/20 (known).  -Presented from SNF after recent hospitalization for CHF with worsening LE edema and hypoxic respiratory failure. He was briefly placed on Bipap ventilation for an SpO2 of 76% on presentation. He has been treated with IV Lasix with good response.  -Weight, 154.1lb today with an admission weight at 182lb on 02/24/20 -I&O, net negative 23.6L -Continue current Lasix dosing and follow response>>>creatinine improved from yesterday>>>1.84>>1.70 today  -No ACEI/ARB/ARNI due to renal disease -Continue beta blocker  2. Acute on chronic hypoxic respiratory failure: -Per most recent hospital discharge summary, patient was planned to be on 2 L nasal cannula however unclear if patient was utilizing supplemental O2. -Found to be hypoxic at the SNF facility and transported to the ED for further evaluation. -Required brief BiPAP ventilation due to persistent  hypoxia on presentation. -Currently with stable saturations on 2L [place]   -Management per IM  3. Paroxysmal atrial fibrillation: -Has a known hx of PAF>>with Linqplacement>>rate controlled  -Continuebeta-blocker and anticoagulated with Eliquis   4. CAD with history of NSTEMI: -S/p NSTEMI10/2020 with severe diagonal and obtuse marginal (thrombotic) disease and otherwise nonobstructive disease. -Unable to assess anginal symptoms  -No acute EKG changes -Continue beta-blocker and statin therapy.   5. Essential hypertension: -Stable, 125/86>>117/89>>102/82 -On beta-blocker and ARB therapy  6. Hyperlipidemia: -LDL of 70, 08/09/19  7. Stage III/IVchronic kidney disease: -Creatinine above baseline at 1.70   -Baseline appears to be in the 1.3-1.5 range  8. Hypokalemia: -K+, 3.2 today>>will supplement with 28meq now   9. Dementia: -Progressive>>unable to assess current health status per patient report   Signed, Kathyrn Drown  NP-C [facility] Pager: [phone] 02/28/2020, 6:31 AM     For questions or updates, please contact   Please consult [email or web address] for contact info under Cardiology/STEMI.

## 2020-02-29 DIAGNOSIS — I5023 Acute on chronic systolic (congestive) heart failure: Secondary | ICD-10-CM | POA: Diagnosis not present

## 2020-02-29 DIAGNOSIS — I480 Paroxysmal atrial fibrillation: Secondary | ICD-10-CM | POA: Diagnosis not present

## 2020-02-29 DIAGNOSIS — N179 Acute kidney failure, unspecified: Secondary | ICD-10-CM | POA: Diagnosis not present

## 2020-02-29 DIAGNOSIS — F0390 Unspecified dementia without behavioral disturbance: Secondary | ICD-10-CM | POA: Diagnosis not present

## 2020-02-29 DIAGNOSIS — I2510 Atherosclerotic heart disease of native coronary artery without angina pectoris: Secondary | ICD-10-CM | POA: Diagnosis not present

## 2020-02-29 DIAGNOSIS — Z515 Encounter for palliative care: Secondary | ICD-10-CM | POA: Diagnosis not present

## 2020-02-29 LAB — BASIC METABOLIC PANEL
Anion gap: 13 (ref 5–15)
BUN: 28 mg/dL — ABNORMAL HIGH (ref 8–23)
CO2: 28 mmol/L (ref 22–32)
Calcium: 9 mg/dL (ref 8.9–10.3)
Chloride: 104 mmol/L (ref 98–111)
Creatinine, Ser: 1.66 mg/dL — ABNORMAL HIGH (ref 0.61–1.24)
GFR, Estimated: 40 mL/min — ABNORMAL LOW (ref >60)
Glucose, Bld: 99 mg/dL (ref 70–99)
Potassium: 3.8 mmol/L (ref 3.5–5.1)
Sodium: 145 mmol/L (ref 135–145)

## 2020-02-29 LAB — MAGNESIUM: Magnesium: 2.1 mg/dL (ref 1.7–2.4)

## 2020-02-29 MED ORDER — FUROSEMIDE 80 MG PO TABS
80.0000 mg | ORAL_TABLET | Freq: Two times a day (BID) | ORAL | Status: DC
  Administered 2020-02-29 – 2020-03-01 (×2): 80 mg via ORAL
  Filled 2020-02-29 (×2): qty 1

## 2020-02-29 NOTE — Progress Notes (Signed)
Daily Progress Note   Patient Name: Brian Brown       Date: 02/29/2020 DOB: 01-18-1932  Age: 87 y.o. MRN#: [id number] Attending Physician: Aline August, MD Primary Care Physician: Seward Carol, MD Admit Date: 02/24/2020  Reason for Follow-up: continued goal of care discussion, disposition  Subjective: 10:00--I facilitated a conference call with daughters Merrie Roof, and Lattie Haw. They have made the decision to bring their father to their home in [place] under hospice care. They wish to use [facility], which is the same agency they used for their mother. We discussed the process of making the referral to hospice as well as DME needs. Since plan is to transport him to [place] by private vehicle, we also discussed we would need to make sure patient would be able to transfer from wheelchair to car. Finally, discussed discharge date of either Friday or Saturday, but that Saturday seemed more realistic.   We again discussed code status. Encouraged family to consider DNR/DNI status understanding evidenced based poor outcomes in similar hospitalized patients, as the cause of the arrest is likely associated with chronic/terminal disease rather than a reversible acute cardio-pulmonary event.  I explained that DNR/DNI does not change the medical plan and it only comes into effect after a person has arrested (died).  It is a protective measure to keep [place] from harming the patient in their last moments of life. Emphasized that for patients going into hospice care, full code status is not consistent with the goal of comfort. Family states that they think they had DNR in place for their mother, but request time to talk among themselves first.   16:24--Spoke with [name] again by phone. Let her know that  patient had walked 89' with PT earlier today. Confirmed address of pharmacy in [place] they want patient's scripts sent to. Discussed issue of wheelchair; they need to have this prior to transporting him back to [place]. Was notified by [facility] that there was prior documentation of a wheelchair at patient's house. [name] confirms there is some type of mobility chair, but they would prefer a traditional wheelchair. I later passed this information along to case manager Hassan Rowan.  I also asked again about code status; [name] states they haven't had a chance to discuss because they are all at work, but will do so tonight.  Length of Stay: 5  Current Medications: Scheduled Meds:  . apixaban  2.5 mg Oral BID  . atorvastatin  40 mg Oral Daily  . Chlorhexidine Gluconate Cloth  6 each Topical Daily  . famotidine  20 mg Oral QHS  . furosemide  80 mg Oral BID  . latanoprost  1 drop Both Eyes QHS  . metoprolol succinate  50 mg Oral Daily  . omega-3 acid ethyl esters  1 g Oral Daily  . potassium chloride  40 mEq Oral Daily  . sodium chloride flush  3 mL Intravenous Q12H  . tamsulosin  0.4 mg Oral Daily  . cyanocobalamin  100 mcg Oral Daily      PRN Meds: acetaminophen, hydrALAZINE, nitroGLYCERIN, OLANZapine zydis, sodium chloride flush           Vital Signs: BP 102/84 (BP Location: Right Arm)   Pulse 99   Temp 98 F (36.7 C) (Oral)   Resp 15   Ht 5\' 9"  (1.753 m)   Wt 69.9 kg   SpO2 95%   BMI 22.76 kg/m  SpO2: SpO2: 95 % O2 Device: O2 Device: Room Air O2 Flow Rate: O2 Flow Rate (L/min): 2 L/min  Intake/output summary:   Intake/Output Summary (Last 24 hours) at 02/29/2020 1540 Last data filed at 02/29/2020 1100 Gross per 24 hour  Intake --  Output 2500 ml  Net -2500 ml   LBM: Last BM Date: 02/27/20 Baseline Weight: Weight: 83 kg Most recent weight: Weight: 69.9 kg       Palliative Assessment/Data: PPS 40%      Palliative Care Assessment & Plan   HPI/Patient Profile:87  y.o.malewith past medical history of CAD, NSTEMI, CHF with EF 20%, atrial fibrillation, CVA with residual right sided weakness, ICH, prostate cancer, CKD, and dementia presented to the ED on 02/24/20 from [facility] with complaints of leg swelling and shortness of breath. Patient was recently hospitalized from 2/10-2/15 for CHF exacerbation. He isadmitted on 2/19/2022with acute on chronic CHF exacerbation, acute on chronic respiratory failure with hypoxia, and acute on chronic CKD.  Assessment: - acute on chronic systolic heart failure (EF < 20%) - acute on chronic hypoxic respiratory failure - acute on chronic kidney disease stage IIIb - paroxysmal A-fib - CAD with history of NSTEMI - dementia  Recommendations/Plan:  Full code with ongoing discussions; DNR/DNI status has been strongly recommended  Continue current medical care  Plan for patient to discharge to daughters' home in [place]  Referral to [facility] in [place] order placed and spoke with case manager and LCSW  DME needs include bedside commode, wheelchair, shower chair  Patient will transport by private vehicle  Need PT consult to ensure patient can safely transfer to/from wheelchair and car  Transition IV lasix to oral  Likely discharge Saturday  PMT will continue to follow   Prognosis:   < 6 months  Discharge Planning:  Home with Hospice  Care plan was discussed with Dr. Starla Link, case management, LCSW  Thank you for allowing the Palliative Medicine Team to assist in the care of this patient.   Total Time 67 minutes Prolonged Time Billed  yes      Greater than 50%  of this time was spent counseling and coordinating care related to the above assessment and plan.  Lavena Bullion, NP  Please contact Palliative Medicine Team phone at [phone] for questions and concerns.

## 2020-02-29 NOTE — Progress Notes (Signed)
LATE ENTRY FROM 02/28/20  Speech Language Pathology Treatment: Dysphagia  Patient Details Name: Brian Brown MRN: [id number] DOB: 1932/02/16  Date: 02/28/2020     Assessment / Plan / Recommendation Clinical Impression  Pt is doing well managing/tolerating a regular diet with thin liquids.  Respiratory status has improved; pt is requiring less oxygen (on room air at time of f/u but has needed 2L via [place]).Breath sounds have been consistent since pt has been on current diet.  Today, he was pleasantly confused but participatory - consumed thin liquids from a straw and regular solids with adequate attention and mastication, brisk swallow response, and no overt s/s of aspiration. Given stability with swallowing and improved respiratory status, no further SLP f/u is needed. Our service will sign off. Continue current diet with assistance with tray set-up and feeding as needed due to confusion.   HPI HPI: Pt is an 87 yo male presenting from SNF with SOB and hypoxia, admitted for CHF exacerbation. Pt had a recent hospitalization 2/10-2/15 for CHF exacerbation, prior to which he was living at home. PMH includes: CAD, NSTEMI, CHF with EF 20%, paroxysmal A. fib on Eliquis, CVA with residual right-sided weakness, history of ICH, HTN      SLP Plan  All goals met       Recommendations  Diet recommendations: Regular;Thin liquid Liquids provided via: Cup;Straw Medication Administration: Whole meds with puree Supervision: Staff to assist with self feeding Compensations: Minimize environmental distractions;Slow rate;Small sips/bites Postural Changes and/or Swallow Maneuvers: Seated upright 90 degrees                Oral Care Recommendations: Oral care BID Follow up Recommendations: Skilled Nursing facility SLP Visit Diagnosis: Dysphagia, unspecified (R13.10) Plan: All goals met       GO                Brian Brown 02/29/2020, 8:18 AM  Estill Bamberg L. Tivis Ringer, [place] Office number [phone] Pager [phone]

## 2020-02-29 NOTE — TOC Initial Note (Signed)
Transition of Care [facility]) - Initial/Assessment Note    Patient Details  Name: Brian Brown MRN: [id number] Date of Birth: 07/14/1932  [facility] CM/SW Contact:    Bethena Roys, RN Phone Number: 02/29/2020, 4:16 PM  Clinical Narrative:  Risk for readmission assessment completed. Case Manager received a consult from the Palliative NP to set the patient up with [facility] in [place]. Case Manager was able to reach out to Daughter [name] at [phone] to discuss the plan of care. Daughter is agreeable to [facility] and they are in need of wheelchair, bedside commode, and shower chair. From the last hospitalization, it seems like the patient already has a rollator, scale, wheelchair, and bedside commode. Palliative NP will discuss with daughter regarding durable medical equipment needs and pharmacy of choice. Referral sent to [facility] and Linna Hoff is reviewing the referral to see if the patient is eligible for hospice services. Daughter [name] states that the patient will transition to her home in [place] and the family will provider transport via private vehicle. Case Manager will continue to follow for additional transition of care needs.                 Expected Discharge Plan: Home w Hospice Care Barriers to Discharge: Continued Medical Work up   Patient Goals and CMS Choice Patient states their goals for this hospitalization and ongoing recovery are:: to go to [place] with his daughter with hospice services. CMS Medicare.gov Compare Post Acute Care list provided to:: Patient Represenative (must comment) Choice offered to / list presented to : Adult Children (Daughter wanted to use [facility] in [place].)  Expected Discharge Plan and Services Expected Discharge Plan: [place] In-house Referral: Hospice / Palliative Care Discharge Planning Services: CM Consult Post Acute Care Choice: Hospice Living arrangements  for the past 2 months: Single Family Home                   DME Agency: NA       HH Arranged: RN [place] Agency: Other - See comment ([place]) Date HH Agency Contacted: 02/29/20 Time [place]: 1400 Representative spoke with at [place]: Iona Beard- information faxed and agency will review  Prior Living Arrangements/Services Living arrangements for the past 2 months: [place] with:: Self (Family is moving the patient to [place] for Hospice Services.) Patient language and need for interpreter reviewed:: Yes Do you feel safe going back to the place where you live?: Yes      Need for Family Participation in Patient Care: Yes (Comment) Care giver support system in place?: Yes (comment) Current home services: DME (From previous visit - CM ordered WC, BSC, Scale and Rollator.) Criminal Activity/Legal Involvement Pertinent to Current Situation/Hospitalization: No - Comment as needed  Activities of Daily Living Home Assistive Devices/Equipment: Walker (specify type) ADL Screening (condition at time of admission) Patient's cognitive ability adequate to safely complete daily activities?: Yes Is the patient deaf or have difficulty hearing?: No Does the patient have difficulty seeing, even when wearing glasses/contacts?: No Does the patient have difficulty concentrating, remembering, or making decisions?: Yes Patient able to express need for assistance with ADLs?: Yes Does the patient have difficulty dressing or bathing?: No Independently performs ADLs?: Yes (appropriate for developmental age) Does the patient have difficulty walking or climbing stairs?: Yes Weakness of Legs: Both Weakness of Arms/Hands: None  Permission Sought/Granted Permission sought to share information with : Family Supports,Case Manager,Facility  Arts administrator granted to share info w AGENCY: [facility] in [place]- awaiting to see if they can  accept the patient        Emotional Assessment Appearance:: Appears stated age       Alcohol / Substance Use: Not Applicable Psych Involvement: No (comment)  Admission diagnosis:  Hypoxia [R09.02] CHF exacerbation ([place]) [I50.9] Acute on chronic heart failure, unspecified heart failure type [facility]) [I50.9] Patient Active Problem List   Diagnosis Date Noted  . CHF exacerbation ([place]) 02/24/2020  . CHF (congestive heart failure) ([place]) 02/15/2020  . Prolonged QT interval 02/15/2020  . Dementia ([place]) 02/15/2020  . Cellulitis of right leg 02/15/2020  . Goals of care, counseling/discussion   . Palliative care by specialist   . Persistent atrial fibrillation ([place])   . Coronary artery disease   . Acute systolic heart failure ([place]) 08/31/2019  . Acute on chronic systolic CHF (congestive heart failure) ([place]) 08/31/2019  . AMS (altered mental status) 08/31/2019  . Acute respiratory failure with hypoxia ([place]) 08/31/2019  . Lactic acidosis 08/31/2019  . History of intracerebral hemorrhage without residual deficit 08/31/2019  . Protein-calorie malnutrition, severe 08/08/2019  . Intracerebral hemorrhage ([place]) 08/07/2019  . Acute CHF (congestive heart failure) ([place]) 08/06/2019  . AKI (acute kidney injury) ([place]) 08/06/2019  . Elevated troponin 08/06/2019  . Transaminitis 08/06/2019  . Closed rib fracture 08/06/2019  . History of CVA (cerebrovascular accident) 08/06/2019  . PAF (paroxysmal atrial fibrillation) ([place]) 10/20/2018  . NSTEMI (non-ST elevated myocardial infarction) ([place])   . Acute CVA (cerebrovascular accident) ([place]) 02/14/2018  . TIA (transient ischemic attack) 02/13/2018  . Hepatitis C reactive 02/06/2011  . Hyponatremia 02/06/2011  . Postoperative anemia due to acute blood loss 02/05/2011  . Hypokalemia 02/05/2011  . Osteoarthritis of hip 01/27/2011  . Hypertension 01/27/2011  . Sleep apnea 01/27/2011  . Stroke/cerebrovascular accident ([place]) 01/27/2011  . Cancer of  prostate ([place]) 01/27/2011   PCP:  Seward Carol, MD Pharmacy:   [id number] [place], [place] - [address] [place] [address] Phone: [phone] Fax: [phone]  [facility] - [place], [place] - [address] [place] [address] Phone: [phone] Fax: [phone]  [facility] [place], [place] [address] [place] [address] Phone: [phone] Fax: [phone]  [facility] [phone] [place] Sherlyn Hay, MD - [place] [address] Phone: [phone] Fax: [phone]  Readmission Risk Interventions Readmission Risk Prevention Plan 02/29/2020 09/04/2019  Transportation Screening Complete Complete  PCP or Specialist Appt within 3-5 Days - Complete  HRI or [place] - Complete  Social Work Consult for [place] Planning/Counseling - Complete  Palliative Care Screening - Not Applicable  Medication Review Press photographer) Complete Complete  PCP or Specialist appointment within 3-5 days of discharge Complete -  [place] or Home Care Consult Complete -  SW Recovery Care/Counseling Consult Complete -  Palliative Care Screening Complete -  [place] Not Applicable -  Some recent data might be hidden

## 2020-02-29 NOTE — Evaluation (Signed)
Physical Therapy Evaluation Patient Details Name: Brian Brown MRN: [id number] DOB: 1932/01/26 Today's Date: 02/29/2020   History of Present Illness  87 year old admitted 2/19 from SNF with SOB, hypoxia and CHF exacerbation. Pt with admission 2/10-2/15 for CHF and prior to that was living at home. PMhx; CVA with residual right-sided weakness, hypertension, CAD, NSTEMI, CHF with EF 20%, AFib, ICH  Clinical Impression  Pt very pleasant with mumbled speech at times. Pt stating we are in [place] but oriented to self and time. Pt requires cues and assist for all mobility with significant decline in mobility from last admission. Pt able to walk 64' with RW and significant assist due to poor balance and cognition. Pt with above and below deficits who will benefit from acute therapy to maximize mobility and safety to decrease burden of care.   SpO2 91-100% on RA HR 86-99    Follow Up Recommendations SNF;Supervision/Assistance - 24 hour    Equipment Recommendations  None recommended by PT    Recommendations for Other Services       Precautions / Restrictions Precautions Precautions: Fall      Mobility  Bed Mobility Overal bed mobility: Needs Assistance Bed Mobility: Supine to Sit     Supine to sit: Min guard;HOB elevated     General bed mobility comments: HOb 40 degrees with use of rail, cues for sequence with assist to move legs, fully elevate trunk and scoot to EOB    Transfers Overall transfer level: Needs assistance   Transfers: Sit to/from Stand Sit to Stand: Min assist         General transfer comment: cues for hand placement, sequence and safety with assist to rise and to control descent  Ambulation/Gait Ambulation/Gait assistance: Mod assist Gait Distance (Feet): 60 Feet Assistive device: Rolling walker (2 wheeled) Gait Pattern/deviations: Step-through pattern;Decreased stride length;Trunk flexed   Gait velocity interpretation: <1.8 ft/sec, indicate of  risk for recurrent falls General Gait Details: pt with wide BOS, maintained flexed trunk and pushing RW too anterior maintaining self posteriorly and without UE support significant posterior bias. Mod assist to direct RW and for balance  Stairs            Wheelchair Mobility    Modified Rankin (Stroke Patients Only)       Balance Overall balance assessment: Needs assistance Sitting-balance support: No upper extremity supported;Feet supported Sitting balance-Leahy Scale: Fair Sitting balance - Comments: guarding for sitting EOB   Standing balance support: Bilateral upper extremity supported;During functional activity Standing balance-Leahy Scale: Poor Standing balance comment: bil UE support with external support for balance                             Pertinent Vitals/Pain Pain Assessment: No/denies pain    Home Living Family/patient expects to be discharged to:: Skilled nursing facility   Available Help at Discharge: Family;Neighbor;Available PRN/intermittently Type of Home: House Home Access: Stairs to enter     Home Layout: One level Home Equipment: Environmental consultant - 4 wheels;Kasandra Knudsen - single point Additional Comments: Lives alone with his dog. Daughters live out of state. Grandson lives locally.    Prior Function Level of Independence: Needs assistance   Gait / Transfers Assistance Needed: assist for transfers and declining gait           Hand Dominance        Extremity/Trunk Assessment   Upper Extremity Assessment Upper Extremity Assessment: Generalized weakness    Lower Extremity  Assessment Lower Extremity Assessment: Generalized weakness    Cervical / Trunk Assessment Cervical / Trunk Assessment: Kyphotic  Communication   Communication: Expressive difficulties  Cognition Arousal/Alertness: Awake/alert Behavior During Therapy: WFL for tasks assessed/performed Overall Cognitive Status: Impaired/Different from baseline Area of Impairment:  Orientation;Memory;Following commands;Safety/judgement;Awareness;Problem solving                 Orientation Level: Disoriented to;Time;Situation;Place   Memory: Decreased short-term memory Following Commands: Follows one step commands inconsistently;Follows one step commands with increased time Safety/Judgement: Decreased awareness of deficits;Decreased awareness of safety Awareness: Intellectual Problem Solving: Slow processing;Requires verbal cues;Requires tactile cues General Comments: pt oriented to self, month and year not place or situation. pt very pleasant and following commands with increased time and max multimodal cues      General Comments      Exercises     Assessment/Plan    PT Assessment Patient needs continued PT services  PT Problem List Decreased strength;Decreased activity tolerance;Decreased balance;Decreased mobility;Decreased cognition;Decreased safety awareness;Pain;Decreased coordination;Decreased knowledge of use of DME       PT Treatment Interventions DME instruction;Gait training;Functional mobility training;Therapeutic activities;Therapeutic exercise;Balance training;Patient/family education;Cognitive remediation    PT Goals (Current goals can be found in the Care Plan section)  Acute Rehab PT Goals Patient Stated Goal: be able to move PT Goal Formulation: With patient Time For Goal Achievement: 03/14/20 Potential to Achieve Goals: Fair    Frequency Min 2X/week   Barriers to discharge Decreased caregiver support      Co-evaluation               AM-PAC PT "6 Clicks" Mobility  Outcome Measure Help needed turning from your back to your side while in a flat bed without using bedrails?: A Little Help needed moving from lying on your back to sitting on the side of a flat bed without using bedrails?: A Little Help needed moving to and from a bed to a chair (including a wheelchair)?: A Little Help needed standing up from a chair using  your arms (e.g., wheelchair or bedside chair)?: A Little Help needed to walk in hospital room?: A Lot Help needed climbing 3-5 steps with a railing? : Total 6 Click Score: 15    End of Session Equipment Utilized During Treatment: Gait belt Activity Tolerance: Patient tolerated treatment well Patient left: in chair;with call bell/phone within reach;with chair alarm set;with nursing/sitter in room;Other (comment) Chief Operating Officer) Nurse Communication: Mobility status;Precautions PT Visit Diagnosis: Other abnormalities of gait and mobility (R26.89);Difficulty in walking, not elsewhere classified (R26.2);Muscle weakness (generalized) (M62.81)    Time: 2633-3545 PT Time Calculation (min) (ACUTE ONLY): 25 min   Charges:   PT Evaluation $PT Eval Moderate Complexity: 1 Mod PT Treatments $Gait Training: 8-22 mins        Maija P, PT Acute Rehabilitation Services Pager: [phone] Office: [place] 02/29/2020, 1:39 PM

## 2020-02-29 NOTE — Progress Notes (Signed)
Patient ID: MANNIX KROEKER, male   DOB: 1932-10-18, 87 y.o.   MRN: [id number]  PROGRESS NOTE    EGOR FULLILOVE  UXN:[id number] DOB: 03-12-1932 DOA: 02/24/2020 PCP: Seward Carol, MD   Brief Narrative:  87 year old male with history of CAD, non-STEMI, chronic systolic CHF with EF of 25%, paroxysmal A. fib on Eliquis, unspecified CVA with residual right-sided weakness, ICH, hypertension, recent admission from 02/15/20-02/20/20 for CHF exacerbation and subsequently discharged to SNF presented with worsening dyspnea. On presentation, he was hypoxic requiring supplemental oxygen. He was started on intravenous Lasix. Chest x-ray showed pulmonary vascular congestion. Cardiology and palliative care were consulted.  Assessment & Plan:   Acute on chronic systolic heart failure -Echo on 2020-02-16 showed EF of 20%. -Cardiology following and managing diuretics. Currently on Lasix 40 mg IVevery 12 hours continue metoprolol. Strict input and output. Daily weights. Fluid restriction. Negative balance of 26,541.1 cc since admission  Acute on chronic hypoxic respiratory failure -Patient was supposed to be discharged on 3 L oxygen. He presented very hypoxic and required 15 L oxygen at some point -Respiratory status has much improved.  Currently on room air.  Acute on chronic kidney disease stage IIIb -Baseline creatinine of 1.3-1.4 -Creatinine improving; 1.66 today. Monitor  Hypokalemia -Improved  Paroxysmal A. Fib -Currently rate controlled. Continue Eliquis and metoprolol  CAD with history of non-STEMI -Stable. Continue beta-blocker and statin; cardiology following  Hypertension -Blood pressure stable. Continue beta-blocker  Hyperlipidemia -Continue statin  Dementia with memory deficits -Fall precautions. -Palliative care following for goals of care discussion. Currently still full code. -Discussed goals of care with daughter [name] on phone on 02/28/2020 and recommended DNR and consideration  of home hospice.  History of unspecified CVA/ICH -Continue statin/anticoagulation  DVT prophylaxis: Eliquis Code Status: Full Family Communication: spoke to daughter/Laura on 02/28/2020 on phone Disposition Plan: Status is: Inpatient  Remains inpatient appropriate because:Inpatient level of care appropriate due to severity of illness   Dispo: The patient is from: SNF              Anticipated d/c is to: SNF versus home with hospice depending on family decision              Anticipated d/c date is: 1 day              Patient currently is not medically stable to d/c.   Difficult to place patient No  Consultants: Cardiology/palliative care  Procedures: None  Antimicrobials: None   Subjective: Patient seen and examined at bedside.  Poor historian.  Awake but confused and does not participate in conversation much.  No overnight fever vomiting or agitation reported.  Objective: Vitals:   02/28/20 1128 02/28/20 1729 02/28/20 2015 02/29/20 0040  BP: 124/90 (!) 127/98 100/85 102/84  Pulse: 86 90 92 87  Resp: 14 11 20 15   Temp: (!) 97.5 F (36.4 C) 97.7 F (36.5 C) 98.2 F (36.8 C) 98 F (36.7 C)  TempSrc: Oral Oral Oral Oral  SpO2: 100% 100% 100%   Weight:      Height:        Intake/Output Summary (Last 24 hours) at 02/29/2020 0802 Last data filed at 02/29/2020 0700 Gross per 24 hour  Intake -  Output 2900 ml  Net -2900 ml   Filed Weights   02/26/20 0355 02/27/20 0316 02/28/20 0450  Weight: 76.9 kg 72.5 kg 69.9 kg    Examination:  General exam: No distress.  Looks chronically ill.  Currently on room air.  Awake but confused and does not participate in conversation much. Respiratory system: Bilateral decreased air sounds at bases with some scattered crackles Cardiovascular system: S1-S2 heard, rate controlled  gastrointestinal system: Abdomen is nondistended, soft and nontender.  Normal bowel sounds heard  extremities: No cyanosis.  Lower extremity edema present  bilaterally   Data Reviewed: I have personally reviewed following labs and imaging studies  CBC: Recent Labs  Lab 02/24/20 1121 02/24/20 1452 02/28/20 0300  WBC 7.7  --  6.5  NEUTROABS  --   --  4.5  HGB 14.3 13.9 13.1  HCT 44.1 41.0 40.2  MCV 91.1  --  90.7  PLT 200  --  323   Basic Metabolic Panel: Recent Labs  Lab 02/25/20 0455 02/26/20 0219 02/27/20 0229 02/28/20 0300 02/29/20 0246  NA 143 144 145 143 145  K 3.6 3.4* 3.5 3.2* 3.8  CL 101 100 101 99 104  CO2 28 28 35* 32 28  GLUCOSE 86 90 104* 99 99  BUN 41* 36* 34* 33* 28*  CREATININE 2.18* 2.12* 1.84* 1.70* 1.66*  CALCIUM 9.3 9.4 9.2 9.0 9.0  MG  --   --  2.0 1.9 2.1   GFR: Estimated Creatinine Clearance: 32.2 mL/min (A) (by C-G formula based on SCr of 1.66 mg/dL (H)). Liver Function Tests: No results for input(s): AST, ALT, ALKPHOS, BILITOT, PROT, ALBUMIN in the last 168 hours. No results for input(s): LIPASE, AMYLASE in the last 168 hours. No results for input(s): AMMONIA in the last 168 hours. Coagulation Profile: No results for input(s): INR, PROTIME in the last 168 hours. Cardiac Enzymes: No results for input(s): CKTOTAL, CKMB, CKMBINDEX, TROPONINI in the last 168 hours. BNP (last 3 results) No results for input(s): PROBNP in the last 8760 hours. HbA1C: No results for input(s): HGBA1C in the last 72 hours. CBG: Recent Labs  Lab 02/25/20 0749  GLUCAP 99   Lipid Profile: No results for input(s): CHOL, HDL, LDLCALC, TRIG, CHOLHDL, LDLDIRECT in the last 72 hours. Thyroid Function Tests: No results for input(s): TSH, T4TOTAL, FREET4, T3FREE, THYROIDAB in the last 72 hours. Anemia Panel: No results for input(s): VITAMINB12, FOLATE, FERRITIN, TIBC, IRON, RETICCTPCT in the last 72 hours. Sepsis Labs: No results for input(s): PROCALCITON, LATICACIDVEN in the last 168 hours.  Recent Results (from the past 240 hour(s))  SARS CORONAVIRUS 2 (TAT 6-24 HRS) Nasopharyngeal Nasopharyngeal Swab     Status:  None   Collection Time: 02/19/20 12:01 PM   Specimen: Nasopharyngeal Swab  Result Value Ref Range Status   SARS Coronavirus 2 NEGATIVE NEGATIVE Final    Comment: (NOTE) SARS-CoV-2 target nucleic acids are NOT DETECTED.  The SARS-CoV-2 RNA is generally detectable in upper and lower respiratory specimens during the acute phase of infection. Negative results do not preclude SARS-CoV-2 infection, do not rule out co-infections with other pathogens, and should not be used as the sole basis for treatment or other patient management decisions. Negative results must be combined with clinical observations, patient history, and epidemiological information. The expected result is Negative.  Fact Sheet for Patients: SugarRoll.be  Fact Sheet for Healthcare Providers: https://www.woods-mathews.com/  This test is not yet approved or cleared by the [place] FDA and  has been authorized for detection and/or diagnosis of SARS-CoV-2 by FDA under an Emergency Use Authorization (EUA). This EUA will remain  in effect (meaning this test can be used) for the duration of the COVID-19 declaration under Se ction 564(b)(1) of the Act, 21 U.S.C. section 360bbb-3(b)(1), unless  the authorization is terminated or revoked sooner.  Performed at [facility], [place] [address], [place], [place] [address]   Resp Panel by RT-PCR (Flu A&B, Covid) Nasopharyngeal Swab     Status: None   Collection Time: 02/24/20 12:53 PM   Specimen: Nasopharyngeal Swab; Nasopharyngeal(NP) swabs in vial transport medium  Result Value Ref Range Status   SARS Coronavirus 2 by RT PCR NEGATIVE NEGATIVE Final    Comment: (NOTE) SARS-CoV-2 target nucleic acids are NOT DETECTED.  The SARS-CoV-2 RNA is generally detectable in upper respiratory specimens during the acute phase of infection. The lowest concentration of SARS-CoV-2 viral copies this assay can detect is 138 copies/mL. A negative  result does not preclude SARS-Cov-2 infection and should not be used as the sole basis for treatment or other patient management decisions. A negative result may occur with  improper specimen collection/handling, submission of specimen other than nasopharyngeal swab, presence of viral mutation(s) within the areas targeted by this assay, and inadequate number of viral copies(<138 copies/mL). A negative result must be combined with clinical observations, patient history, and epidemiological information. The expected result is Negative.  Fact Sheet for Patients:  EntrepreneurPulse.com.au  Fact Sheet for Healthcare Providers:  IncredibleEmployment.be  This test is no t yet approved or cleared by the [place] FDA and  has been authorized for detection and/or diagnosis of SARS-CoV-2 by FDA under an Emergency Use Authorization (EUA). This EUA will remain  in effect (meaning this test can be used) for the duration of the COVID-19 declaration under Section 564(b)(1) of the Act, 21 U.S.C.section 360bbb-3(b)(1), unless the authorization is terminated  or revoked sooner.       Influenza A by PCR NEGATIVE NEGATIVE Final   Influenza B by PCR NEGATIVE NEGATIVE Final    Comment: (NOTE) The Xpert Xpress SARS-CoV-2/FLU/RSV plus assay is intended as an aid in the diagnosis of influenza from Nasopharyngeal swab specimens and should not be used as a sole basis for treatment. Nasal washings and aspirates are unacceptable for Xpert Xpress SARS-CoV-2/FLU/RSV testing.  Fact Sheet for Patients: EntrepreneurPulse.com.au  Fact Sheet for Healthcare Providers: IncredibleEmployment.be  This test is not yet approved or cleared by the [place] FDA and has been authorized for detection and/or diagnosis of SARS-CoV-2 by FDA under an Emergency Use Authorization (EUA). This EUA will remain in effect (meaning this test can be used)  for the duration of the COVID-19 declaration under Section 564(b)(1) of the Act, 21 U.S.C. section 360bbb-3(b)(1), unless the authorization is terminated or revoked.  Performed at [facility], [place] [address] [place], [place] [address]          Radiology Studies: No results found.      Scheduled Meds: . apixaban  2.5 mg Oral BID  . atorvastatin  40 mg Oral Daily  . Chlorhexidine Gluconate Cloth  6 each Topical Daily  . famotidine  20 mg Oral QHS  . furosemide  40 mg Intravenous Q12H  . latanoprost  1 drop Both Eyes QHS  . metoprolol succinate  50 mg Oral Daily  . omega-3 acid ethyl esters  1 g Oral Daily  . potassium chloride  40 mEq Oral Daily  . sodium chloride flush  3 mL Intravenous Q12H  . tamsulosin  0.4 mg Oral Daily  . cyanocobalamin  100 mcg Oral Daily   Continuous Infusions:        Aline August, MD [facility] 02/29/2020, 8:02 AM

## 2020-02-29 NOTE — Progress Notes (Addendum)
Patient ID: Brian Brown, male   DOB: 08-16-1932, 87 y.o.   MRN: [id number]      Palliative Medicine Inpatient Follow Up Note    Family Discussion: 02/28/2019   Providers: Elie Confer NP   Participants:     I facilitated a conference call with the patient's daughters to further discuss the topic of home hospice. They express concern about patient's decreased mobility and how that will affect their ability to care for him at home. They report that when they cared for their mother at home, she was much more mobile at least in the beginning. I provided education that  mobility will continue to be an issue for Brian Brown due fatigue and weakness secondary to advanced heart failure. Discussed the option to obtain DME such as a walker and bedside commode to assist with mobility.   Daughters again expressed concern about patient's altered mental status. I discussed with them that on careful chart review and discuss with nursing, it seems his mental status waxes and wanes. Explained that sometimes he is more clear and communicative, and sometimes he is more confused. Provided education and counseling that this is very common with dementia. Discussed that he continued to be mildly agitated at times, which is also common with dementia. Daughters inquire whether he is still requiring soft mittens, and I let them know that yes he is due to continued pulling at his IV and other equipment. Discussed that patients can also experience acute delirium while in the hospital, and this especially a concern for patient with dementia due to being in an unfamiliar environment. Daughters express this is all the more reason to get him back home.   Additional discussion was had regarding whether daughters would bring patient to live with them at their house in [place] versus coming to [facility] and living with him in his house. It would be easier for them to bring him to [place]. They had a very positive experience with  the hospice agency that cared for their mother. Discussed the issue of transport. They would want to transport him by private vehicle, which I stated should be fine as long as he was stable to endure the ride and was able to transfer from wheelchair to car. They express concern that he may be more comfortable in his house. Discussed that logistically this would be an easier option as it would involve utilizing a local hospice agency.   We did discuss code status. Encouraged daughter again to consider DNR/DNI status understanding evidenced based poor outcomes in similar hospitalized patients, as the cause of the arrest is likely associated with chronic/terminal disease rather than a reversible acute cardio-pulmonary event. They seem to indicate that DNR is appropriate however they are not yet ready to make that decision.  Daughters inquire when he will discharged. I let them know he needed to be medically stable and that anticipated discharge date was in 2 days. Discussed that IV lasix would need to be transitioned to oral. They inquire whether they needed to make a decision about hospice prior to discharge. I encouraged them it would be better to have a care plan in place so that they had the necessary resources to care for him either at their home in [place] or his home in [place].  Daughters request time to process the information and discuss amongst themselves. We plan for another conference call tomorrow at 10:00 am.     Thank you for allowing the Palliative Medicine Team to assist in  the care of this patient.   Greater than 50%  of this time was spent counseling and coordinating care related to the above assessment and plan.  Total time: 62 minutes   Lavena Bullion, NP Palliative Medicine   Please contact Palliative Medicine Team phone at [phone] for questions and concerns.  For individual provider, see AMION.

## 2020-02-29 NOTE — Progress Notes (Signed)
Progress Note  Patient Name: Brian Brown Date of Encounter: 02/29/2020  Primary Cardiologist: Larae Grooms, MD  Subjective   Sleepy, answers appropriately. Denies SOB or CP  Inpatient Medications    Scheduled Meds: . apixaban  2.5 mg Oral BID  . atorvastatin  40 mg Oral Daily  . Chlorhexidine Gluconate Cloth  6 each Topical Daily  . famotidine  20 mg Oral QHS  . furosemide  40 mg Intravenous Q12H  . latanoprost  1 drop Both Eyes QHS  . metoprolol succinate  50 mg Oral Daily  . omega-3 acid ethyl esters  1 g Oral Daily  . potassium chloride  40 mEq Oral Daily  . sodium chloride flush  3 mL Intravenous Q12H  . tamsulosin  0.4 mg Oral Daily  . cyanocobalamin  100 mcg Oral Daily   Continuous Infusions:  PRN Meds: acetaminophen, hydrALAZINE, nitroGLYCERIN, OLANZapine zydis, sodium chloride flush   Vital Signs    Vitals:   02/28/20 1128 02/28/20 1729 02/28/20 2015 02/29/20 0040  BP: 124/90 (!) 127/98 100/85 102/84  Pulse: 86 90 92 87  Resp: 14 11 20 15   Temp: (!) 97.5 F (36.4 C) 97.7 F (36.5 C) 98.2 F (36.8 C) 98 F (36.7 C)  TempSrc: Oral Oral Oral Oral  SpO2: 100% 100% 100%   Weight:      Height:        Intake/Output Summary (Last 24 hours) at 02/29/2020 0746 Last data filed at 02/29/2020 0700 Gross per 24 hour  Intake -  Output 2900 ml  Net -2900 ml   Filed Weights   02/26/20 0355 02/27/20 0316 02/28/20 0450  Weight: 76.9 kg 72.5 kg 69.9 kg    Physical Exam   GEN:  frail, elderly male, No acute distress.   Neck: JVD 10 cm at 30 degrees Cardiac: Irreg R&R, no murmur, no rubs, or gallops.  Respiratory: diminished to auscultation bilaterally with rales in the bases. GI: Soft, nontender, non-distended  MS: No edema; No deformity. 4/4 Extremity pulses 2+ Neuro:  Nonfocal  Psych: Normal affect    Labs    Chemistry Recent Labs  Lab 02/27/20 0229 02/28/20 0300 02/29/20 0246  NA 145 143 145  K 3.5 3.2* 3.8  CL 101 99 104  CO2  35* 32 28  GLUCOSE 104* 99 99  BUN 34* 33* 28*  CREATININE 1.84* 1.70* 1.66*  CALCIUM 9.2 9.0 9.0  GFRNONAA 35* 39* 40*  ANIONGAP 9 12 13      Hematology Recent Labs  Lab 02/24/20 1121 02/24/20 1452 02/28/20 0300  WBC 7.7  --  6.5  RBC 4.84  --  4.43  HGB 14.3 13.9 13.1  HCT 44.1 41.0 40.2  MCV 91.1  --  90.7  MCH 29.5  --  29.6  MCHC 32.4  --  32.6  RDW 14.9  --  14.7  PLT 200  --  185    Cardiac EnzymesNo results for input(s): TROPONINI in the last 168 hours. No results for input(s): TROPIPOC in the last 168 hours.   BNP Recent Labs  Lab 02/24/20 1121  BNP 2,617.0*     DDimer No results for input(s): DDIMER in the last 168 hours.   Radiology    No results found.  Telemetry    Afib, PVCs, rate ok Personally Reviewed  ECG    No new tracing as of 02/29/20- Personally Reviewed  Cardiac Studies   Echocardiogram 02/16/20:  1. LV apex well-visualized without contrast, no mural  thrombus  identified. Left ventricular ejection fraction, by estimation, is <20%.  The left ventricle has severely decreased function. The left ventricle  demonstrates global hypokinesis. There is mild  left ventricular hypertrophy. Left ventricular diastolic function could  not be evaluated. Elevated left ventricular end-diastolic pressure.  2. Right ventricular systolic function is moderately reduced. The right  ventricular size is mildly enlarged. There is moderately elevated  pulmonary artery systolic pressure. The estimated right ventricular  systolic pressure is 62.3 mmHg.  3. Left atrial size was moderately dilated.  4. Right atrial size was severely dilated.  5. Moderate pleural effusion in the left lateral region.  6. The mitral valve is abnormal. Moderate mitral valve regurgitation.  7. Tricuspid valve regurgitation is moderate.  8. The aortic valve is tricuspid. Aortic valve regurgitation is not  visualized. Mild aortic valve sclerosis is present, with no evidence  of  aortic valve stenosis.  9. The pulmonic valve was abnormal.  10. Mildly dilated pulmonary artery.  11. The inferior vena cava is dilated in size with <50% respiratory  variability, suggesting right atrial pressure of 15 mmHg.   Echocardiogram 09/04/20:  1. Left ventricular ejection fraction, by estimation, is 20%. The left  ventricle has severely decreased function. The left ventricle demonstrates  global hypokinesis. There is mild left ventricular hypertrophy. Left  ventricular diastolic parameters are  indeterminate.  2. Right ventricular systolic function is moderately reduced. The right  ventricular size is normal. There is mildly elevated pulmonary artery  systolic pressure. The estimated right ventricular systolic pressure is  76.2 mmHg.  3. The mitral valve is normal in structure. Mild mitral valve  regurgitation. No evidence of mitral stenosis.  4. The aortic valve is normal in structure. Aortic valve regurgitation is  not visualized. Mild aortic valve sclerosis is present, with no evidence  of aortic valve stenosis.  5. The inferior vena cava is dilated in size with <50% respiratory  variability, suggesting right atrial pressure of 15 mmHg.  6. There is a significant left pleural effusion.  7. The patient is in atrial fibrillation.   Patient Profile     87 y.o. 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 Benny Lennert.  Assessment & Plan    1.  Acute on chronic systolic CHF/acute hypoxic respiratory failure: - EF < 20% by recent echo - bounce back from SNF for worsening CHF - Initially on BiPAP - diuresed and down 28 lbs, I/O net -26.5 L - on BB, no ACE, etc w/ decreased renal function - follow daily  BMET, suspect close to euvolemic  2.  Acute on chronic hypoxic respiratory failure: - no longer hypoxic, off BiPAP, now off O2 - per IM  3.  Paroxysmal atrial fibrillation: - s/p LINQ placement - HR controlled on tele - on BB and Eliquis   4.  CAD with history of NSTEMI: - NSTEMI 2020 w/ med rx for small D1 and dOM2 dz - no c/o CP - No ASA w/ Eliquis - continue BB, statin  5. Essential hypertension:  - SBP 100s on current therapy  6. Stage III/IV chronic kidney disease:  - Cr 2.18 on admit, trending down w/ diuresis - follow  7. Hypokalemia:  - supplemented previously by IM  and improved - ok today  8. Dementia: - per IM   Rosaria Ferries, PA-C 02/29/2020 7:46 AM     For questions or updates, please contact   Please consult [email or web address] for contact info under Cardiology/STEMI.

## 2020-03-01 DIAGNOSIS — I5023 Acute on chronic systolic (congestive) heart failure: Secondary | ICD-10-CM | POA: Diagnosis not present

## 2020-03-01 DIAGNOSIS — N179 Acute kidney failure, unspecified: Secondary | ICD-10-CM | POA: Diagnosis not present

## 2020-03-01 DIAGNOSIS — F0390 Unspecified dementia without behavioral disturbance: Secondary | ICD-10-CM | POA: Diagnosis not present

## 2020-03-01 DIAGNOSIS — I480 Paroxysmal atrial fibrillation: Secondary | ICD-10-CM | POA: Diagnosis not present

## 2020-03-01 DIAGNOSIS — I509 Heart failure, unspecified: Secondary | ICD-10-CM

## 2020-03-01 LAB — MAGNESIUM: Magnesium: 2.2 mg/dL (ref 1.7–2.4)

## 2020-03-01 LAB — BASIC METABOLIC PANEL
Anion gap: 11 (ref 5–15)
BUN: 33 mg/dL — ABNORMAL HIGH (ref 8–23)
CO2: 31 mmol/L (ref 22–32)
Calcium: 8.8 mg/dL — ABNORMAL LOW (ref 8.9–10.3)
Chloride: 102 mmol/L (ref 98–111)
Creatinine, Ser: 1.77 mg/dL — ABNORMAL HIGH (ref 0.61–1.24)
GFR, Estimated: 37 mL/min — ABNORMAL LOW (ref >60)
Glucose, Bld: 83 mg/dL (ref 70–99)
Potassium: 3.5 mmol/L (ref 3.5–5.1)
Sodium: 144 mmol/L (ref 135–145)

## 2020-03-01 MED ORDER — FUROSEMIDE 80 MG PO TABS
80.0000 mg | ORAL_TABLET | Freq: Every day | ORAL | Status: DC
  Administered 2020-03-02: 80 mg via ORAL
  Filled 2020-03-01: qty 1

## 2020-03-01 NOTE — Progress Notes (Signed)
Patient ID: Brian Brown, male   DOB: May 15, 1932, 87 y.o.   MRN: [id number]  PROGRESS NOTE    Brian Brown  ZDG:[id number] DOB: 19-Apr-1932 DOA: 02/24/2020 PCP: Seward Carol, MD   Brief Narrative:  87 year old male with history of CAD, non-STEMI, chronic systolic CHF with EF of 95%, paroxysmal A. fib on Eliquis, unspecified CVA with residual right-sided weakness, ICH, hypertension, recent admission from 02/15/20-02/20/20 for CHF exacerbation and subsequently discharged to SNF presented with worsening dyspnea. On presentation, he was hypoxic requiring supplemental oxygen. He was started on intravenous Lasix. Chest x-ray showed pulmonary vascular congestion. Cardiology and palliative care were consulted.  Assessment & Plan:   Acute on chronic systolic heart failure -Echo on 2020-02-16 showed EF of 20%. -Cardiology following and managing diuretics.  IV Lasix has been switched to oral Lasix on 02/29/2020 by cardiology.  Continue metoprolol. Strict input and output. Daily weights. Fluid restriction. Negative balance of 27,334.1 cc since admission  Acute on chronic hypoxic respiratory failure -Patient was supposed to be discharged on 3 L oxygen. He presented very hypoxic and required 15 L oxygen at some point -Respiratory status has much improved.  Currently on room air.  Acute on chronic kidney disease stage IIIb -Baseline creatinine of 1.3-1.4 -Creatinine improving; 1.77 today. Monitor  Hypokalemia -Improved  Paroxysmal A. Fib -Currently rate controlled. Continue Eliquis and metoprolol  CAD with history of non-STEMI -Stable. Continue beta-blocker and statin; cardiology following  Hypertension -Blood pressure stable. Continue beta-blocker  Hyperlipidemia -Continue statin  Dementia with memory deficits -Fall precautions. -Palliative care following for goals of care discussion. Currently still full code. -Discussed goals of care with daughter [name] on phone on 02/28/2020 and  recommended DNR and consideration of home hospice. -Family is interested in taking the patient home in [place] with home hospice.  Care management following.  History of unspecified CVA/ICH -Continue statin/anticoagulation  DVT prophylaxis: Eliquis Code Status: Full Family Communication: spoke to daughter/Laura on 02/28/2020 on phone Disposition Plan: Status is: Inpatient  Remains inpatient appropriate because:Inpatient level of care appropriate due to severity of illness   Dispo: The patient is from: SNF              Anticipated d/c is to: home with hospice pending arrangement              Anticipated d/c date is: 1 day              Patient currently is not medically stable to d/c.   Difficult to place patient No  Consultants: Cardiology/palliative care  Procedures: None  Antimicrobials: None   Subjective: Patient seen and examined at bedside.  Poor historian.  Awake but confused.  No overnight worsening shortness breath, fever or vomiting reported. Objective: Vitals:   02/29/20 1332 02/29/20 2019 03/01/20 0035 03/01/20 0537  BP:  104/81 107/89 109/85  Pulse: 99 84 90 88  Resp:  20 20 14   Temp:  98.2 F (36.8 C) 98 F (36.7 C) 97.7 F (36.5 C)  TempSrc:  Oral Oral Oral  SpO2: 95% 100% 98% 95%  Weight:    66.8 kg  Height:        Intake/Output Summary (Last 24 hours) at 03/01/2020 0757 Last data filed at 03/01/2020 0630 Gross per 24 hour  Intake 957 ml  Output 1750 ml  Net -793 ml   Filed Weights   02/27/20 0316 02/28/20 0450 03/01/20 0537  Weight: 72.5 kg 69.9 kg 66.8 kg    Examination:  General exam: No  acute distress.  Looks chronically ill.  On room air currently.  Awake but still confused and does not participate in conversation much. Respiratory system: Decreased breath sounds at bases bilaterally with scattered crackles Cardiovascular system: rate controlled, S1-S2 heard gastrointestinal system: Abdomen is nondistended, soft and nontender.  Bowel  sounds are heard  extremities: Trace lower extremity edema present; no clubbing   Data Reviewed: I have personally reviewed following labs and imaging studies  CBC: Recent Labs  Lab 02/24/20 1121 02/24/20 1452 02/28/20 0300  WBC 7.7  --  6.5  NEUTROABS  --   --  4.5  HGB 14.3 13.9 13.1  HCT 44.1 41.0 40.2  MCV 91.1  --  90.7  PLT 200  --  948   Basic Metabolic Panel: Recent Labs  Lab 02/26/20 0219 02/27/20 0229 02/28/20 0300 02/29/20 0246 03/01/20 0139  NA 144 145 143 145 144  K 3.4* 3.5 3.2* 3.8 3.5  CL 100 101 99 104 102  CO2 28 35* 32 28 31  GLUCOSE 90 104* 99 99 83  BUN 36* 34* 33* 28* 33*  CREATININE 2.12* 1.84* 1.70* 1.66* 1.77*  CALCIUM 9.4 9.2 9.0 9.0 8.8*  MG  --  2.0 1.9 2.1 2.2   GFR: Estimated Creatinine Clearance: 28.8 mL/min (A) (by C-G formula based on SCr of 1.77 mg/dL (H)). Liver Function Tests: No results for input(s): AST, ALT, ALKPHOS, BILITOT, PROT, ALBUMIN in the last 168 hours. No results for input(s): LIPASE, AMYLASE in the last 168 hours. No results for input(s): AMMONIA in the last 168 hours. Coagulation Profile: No results for input(s): INR, PROTIME in the last 168 hours. Cardiac Enzymes: No results for input(s): CKTOTAL, CKMB, CKMBINDEX, TROPONINI in the last 168 hours. BNP (last 3 results) No results for input(s): PROBNP in the last 8760 hours. HbA1C: No results for input(s): HGBA1C in the last 72 hours. CBG: Recent Labs  Lab 02/25/20 0749  GLUCAP 99   Lipid Profile: No results for input(s): CHOL, HDL, LDLCALC, TRIG, CHOLHDL, LDLDIRECT in the last 72 hours. Thyroid Function Tests: No results for input(s): TSH, T4TOTAL, FREET4, T3FREE, THYROIDAB in the last 72 hours. Anemia Panel: No results for input(s): VITAMINB12, FOLATE, FERRITIN, TIBC, IRON, RETICCTPCT in the last 72 hours. Sepsis Labs: No results for input(s): PROCALCITON, LATICACIDVEN in the last 168 hours.  Recent Results (from the past 240 hour(s))  Resp Panel by  RT-PCR (Flu A&B, Covid) Nasopharyngeal Swab     Status: None   Collection Time: 02/24/20 12:53 PM   Specimen: Nasopharyngeal Swab; Nasopharyngeal(NP) swabs in vial transport medium  Result Value Ref Range Status   SARS Coronavirus 2 by RT PCR NEGATIVE NEGATIVE Final    Comment: (NOTE) SARS-CoV-2 target nucleic acids are NOT DETECTED.  The SARS-CoV-2 RNA is generally detectable in upper respiratory specimens during the acute phase of infection. The lowest concentration of SARS-CoV-2 viral copies this assay can detect is 138 copies/mL. A negative result does not preclude SARS-Cov-2 infection and should not be used as the sole basis for treatment or other patient management decisions. A negative result may occur with  improper specimen collection/handling, submission of specimen other than nasopharyngeal swab, presence of viral mutation(s) within the areas targeted by this assay, and inadequate number of viral copies(<138 copies/mL). A negative result must be combined with clinical observations, patient history, and epidemiological information. The expected result is Negative.  Fact Sheet for Patients:  EntrepreneurPulse.com.au  Fact Sheet for Healthcare Providers:  IncredibleEmployment.be  This test is no  t yet approved or cleared by the [place] and  has been authorized for detection and/or diagnosis of SARS-CoV-2 by FDA under an Emergency Use Authorization (EUA). This EUA will remain  in effect (meaning this test can be used) for the duration of the COVID-19 declaration under Section 564(b)(1) of the Act, 21 U.S.C.section 360bbb-3(b)(1), unless the authorization is terminated  or revoked sooner.       Influenza A by PCR NEGATIVE NEGATIVE Final   Influenza B by PCR NEGATIVE NEGATIVE Final    Comment: (NOTE) The Xpert Xpress SARS-CoV-2/FLU/RSV plus assay is intended as an aid in the diagnosis of influenza from Nasopharyngeal swab  specimens and should not be used as a sole basis for treatment. Nasal washings and aspirates are unacceptable for Xpert Xpress SARS-CoV-2/FLU/RSV testing.  Fact Sheet for Patients: EntrepreneurPulse.com.au  Fact Sheet for Healthcare Providers: IncredibleEmployment.be  This test is not yet approved or cleared by the [place] FDA and has been authorized for detection and/or diagnosis of SARS-CoV-2 by FDA under an Emergency Use Authorization (EUA). This EUA will remain in effect (meaning this test can be used) for the duration of the COVID-19 declaration under Section 564(b)(1) of the Act, 21 U.S.C. section 360bbb-3(b)(1), unless the authorization is terminated or revoked.  Performed at [facility], [place] [address] [place], [place] [address]          Radiology Studies: No results found.      Scheduled Meds: . apixaban  2.5 mg Oral BID  . atorvastatin  40 mg Oral Daily  . Chlorhexidine Gluconate Cloth  6 each Topical Daily  . famotidine  20 mg Oral QHS  . furosemide  80 mg Oral BID  . latanoprost  1 drop Both Eyes QHS  . metoprolol succinate  50 mg Oral Daily  . omega-3 acid ethyl esters  1 g Oral Daily  . potassium chloride  40 mEq Oral Daily  . sodium chloride flush  3 mL Intravenous Q12H  . tamsulosin  0.4 mg Oral Daily  . cyanocobalamin  100 mcg Oral Daily   Continuous Infusions:        Aline August, MD [facility] 03/01/2020, 7:57 AM

## 2020-03-01 NOTE — Progress Notes (Signed)
Progress Note  Patient Name: Brian Brown Date of Encounter: 03/01/2020  Primary Cardiologist: Larae Grooms, MD  Subjective   Looks good, up eating breakfast. No complaints.   Inpatient Medications    Scheduled Meds: . apixaban  2.5 mg Oral BID  . atorvastatin  40 mg Oral Daily  . Chlorhexidine Gluconate Cloth  6 each Topical Daily  . famotidine  20 mg Oral QHS  . furosemide  80 mg Oral BID  . latanoprost  1 drop Both Eyes QHS  . metoprolol succinate  50 mg Oral Daily  . omega-3 acid ethyl esters  1 g Oral Daily  . potassium chloride  40 mEq Oral Daily  . sodium chloride flush  3 mL Intravenous Q12H  . tamsulosin  0.4 mg Oral Daily  . cyanocobalamin  100 mcg Oral Daily   Continuous Infusions:  PRN Meds: acetaminophen, hydrALAZINE, nitroGLYCERIN, OLANZapine zydis, sodium chloride flush   Vital Signs    Vitals:   02/29/20 1332 02/29/20 2019 03/01/20 0035 03/01/20 0537  BP:  104/81 107/89 109/85  Pulse: 99 84 90 88  Resp:  20 20 14   Temp:  98.2 F (36.8 C) 98 F (36.7 C) 97.7 F (36.5 C)  TempSrc:  Oral Oral Oral  SpO2: 95% 100% 98% 95%  Weight:    66.8 kg  Height:        Intake/Output Summary (Last 24 hours) at 03/01/2020 0739 Last data filed at 03/01/2020 0630 Gross per 24 hour  Intake 957 ml  Output 1750 ml  Net -793 ml   Filed Weights   02/27/20 0316 02/28/20 0450 03/01/20 0537  Weight: 72.5 kg 69.9 kg 66.8 kg    Physical Exam   General: Elderly, NAD Neck: Negative for carotid bruits. No JVD Lungs:Clear to ausculation bilaterally. No wheezes, rales, or rhonchi. Breathing is unlabored. Cardiovascular: Irregularly irregular with S1 S2. No murmurs Abdomen: Soft, non-tender, non-distended. No obvious abdominal masses. Extremities: No edema. Radial pulses 2+ bilaterally Neuro: Alert and oriented to self and situation. No focal deficits. No facial asymmetry. MAE spontaneously. Psych: Responds to questions appropriately with normal affect.     Labs    Chemistry Recent Labs  Lab 02/28/20 0300 02/29/20 0246 03/01/20 0139  NA 143 145 144  K 3.2* 3.8 3.5  CL 99 104 102  CO2 32 28 31  GLUCOSE 99 99 83  BUN 33* 28* 33*  CREATININE 1.70* 1.66* 1.77*  CALCIUM 9.0 9.0 8.8*  GFRNONAA 39* 40* 37*  ANIONGAP 12 13 11      Hematology Recent Labs  Lab 02/24/20 1121 02/24/20 1452 02/28/20 0300  WBC 7.7  --  6.5  RBC 4.84  --  4.43  HGB 14.3 13.9 13.1  HCT 44.1 41.0 40.2  MCV 91.1  --  90.7  MCH 29.5  --  29.6  MCHC 32.4  --  32.6  RDW 14.9  --  14.7  PLT 200  --  185    Cardiac EnzymesNo results for input(s): TROPONINI in the last 168 hours. No results for input(s): TROPIPOC in the last 168 hours.   BNP Recent Labs  Lab 02/24/20 1121  BNP 2,617.0*     DDimer No results for input(s): DDIMER in the last 168 hours.   Radiology    No results found.  Telemetry    03/01/20 AF with rates in the 70's  - Personally Reviewed  ECG    No new tracing as of 03/01/20- Personally Reviewed  Cardiac  Studies   Echocardiogram 02/16/20:  1. LV apex well-visualized without contrast, no mural thrombus  identified. Left ventricular ejection fraction, by estimation, is <20%.  The left ventricle has severely decreased function. The left ventricle  demonstrates global hypokinesis. There is mild  left ventricular hypertrophy. Left ventricular diastolic function could  not be evaluated. Elevated left ventricular end-diastolic pressure.  2. Right ventricular systolic function is moderately reduced. The right  ventricular size is mildly enlarged. There is moderately elevated  pulmonary artery systolic pressure. The estimated right ventricular  systolic pressure is 17.5 mmHg.  3. Left atrial size was moderately dilated.  4. Right atrial size was severely dilated.  5. Moderate pleural effusion in the left lateral region.  6. The mitral valve is abnormal. Moderate mitral valve regurgitation.  7. Tricuspid valve  regurgitation is moderate.  8. The aortic valve is tricuspid. Aortic valve regurgitation is not  visualized. Mild aortic valve sclerosis is present, with no evidence of  aortic valve stenosis.  9. The pulmonic valve was abnormal.  10. Mildly dilated pulmonary artery.  11. The inferior vena cava is dilated in size with <50% respiratory  variability, suggesting right atrial pressure of 15 mmHg.   Echocardiogram 09/04/20:  1. Left ventricular ejection fraction, by estimation, is 20%. The left  ventricle has severely decreased function. The left ventricle demonstrates  global hypokinesis. There is mild left ventricular hypertrophy. Left  ventricular diastolic parameters are  indeterminate.  2. Right ventricular systolic function is moderately reduced. The right  ventricular size is normal. There is mildly elevated pulmonary artery  systolic pressure. The estimated right ventricular systolic pressure is  10.2 mmHg.  3. The mitral valve is normal in structure. Mild mitral valve  regurgitation. No evidence of mitral stenosis.  4. The aortic valve is normal in structure. Aortic valve regurgitation is  not visualized. Mild aortic valve sclerosis is present, with no evidence  of aortic valve stenosis.  5. The inferior vena cava is dilated in size with <50% respiratory  variability, suggesting right atrial pressure of 15 mmHg.  6. There is a significant left pleural effusion.  7. The patient is in atrial fibrillation.  Patient Profile     87 y.o. male a hx ofCAD s/p NSTEMI 10/2018, CVA x 2 (last 02/2018), PAF, HTN, HLD, ICM/HFrEF (EF 21% 08/2019), CKD II, and R thalamic bleed (08/2019) prompting discontinuationof Eliquis and Plavix therapy, who is being seen today for the evaluation ofacute on chronic systolic heart failureat the request ofDr. Swayze.  Assessment & Plan    1.Acute on chronic systolic CHF/acute hypoxic respiratory failure: -Pt has a know hx of systolic  HF/cardiomyopathy with a most recent LVEF at <20% per echo 02/16/20 (known). -Presented from SNF after recent hospitalization for CHF with worsening LE edema and hypoxic respiratory failure. He was briefly placed on Bipap ventilation for an SpO2 of 76% on presentation. He has been treated with IV Lasix with good response.  -HENIDP,824MP today with an admission weight at 182lb on 02/24/20 -I&O, net negative27.3L -Creatinine, 1.77 up from 1.66 yesterday  -Transitioned to PO Lasix yesterday>>tolerating well>>continue  -Plan was to attempt to add ARB today however BPs appear to be rather low and renal function slightly worse >>BP 109/85>>107/89>>104/81 -Continue beta blocker  2. Acute on chronic hypoxic respiratory failure: -Per most recent hospital discharge summary, patient was planned to be on 2 L nasal cannula however unclear if patient was utilizing supplemental O2. -Found to be hypoxic at the SNF facility and transported  to the ED for further evaluation. -Required brief BiPAP ventilation due to persistent hypoxia on presentation. -Currently with stable saturations on2L [place] -Management per IM  3. Paroxysmal atrial fibrillation: -Has a known hx of PAF>>with Linqplacement>>rate controlled  -Continuebeta-blocker and anticoagulated with Eliquis   4. CAD with history of NSTEMI: -s/p NSTEMI10/2020 with severe diagonal and obtuse marginal (thrombotic) disease and otherwise nonobstructive disease. -Unable to assess anginal symptoms  -No acute EKG changes -Continue beta-blocker and statin therapy.   5. Essential hypertension: -Stable, 109/85>>107/89>>104/81  -Onbeta-blocker and ARB therapy  6. Hyperlipidemia: -LDL of 70, 08/09/19  7. Stage III/IVchronic kidney disease: -Creatinine above baseline at 1.77 today  -Baseline appears to be in the 1.3-1.5 range  8. Hypokalemia: -K+, 3.5 today    9. Dementia: -Progressive>>unable to assess current health  status per patient report   Signed, Kathyrn Drown NP-C [facility] Pager: [phone] 03/01/2020, 7:39 AM     For questions or updates, please contact   Please consult [email or web address] for contact info under Cardiology/STEMI.

## 2020-03-01 NOTE — Care Management Important Message (Signed)
Important Message  Patient Details  Name: KHYLON DAVIES MRN: [id number] Date of Birth: 12-16-1932   Medicare Important Message Given:  Yes     Shelda Altes 03/01/2020, 1:11 PM

## 2020-03-01 NOTE — Progress Notes (Signed)
Daily Progress Note   Patient Name: Brian Brown       Date: 03/01/2020 DOB: [birth year]  Age: 87 y.o. MRN#: [id number] Attending Physician: Aline August, MD Primary Care Physician: Seward Carol, MD Admit Date: 02/24/2020  Reason for Consultation/Follow-up: Establishing goals of care  Subjective:  Chart review performed. Received updates from LCSW - patient was originally not accepted at [facility] in [place]; however, after further review patient was later accepted. LCSW and Dr. Starla Link are planning for discharge tomorrow 2/25.  Called patient's daughter/Laura for continued code status discussion. Brian Brown inquired about discharge planning - informed her the plan at this time is for discharge tomorrow. Brian Brown is agreeable for PMT to call back tomorrow AM for final decision on code status.   All questions and concerns addressed. Encouraged to call with questions and/or concerns. PMT number previously provided.   Length of Stay: 6  Current Medications: Scheduled Meds:  . apixaban  2.5 mg Oral BID  . atorvastatin  40 mg Oral Daily  . Chlorhexidine Gluconate Cloth  6 each Topical Daily  . famotidine  20 mg Oral QHS  . [START ON 03/02/2020] furosemide  80 mg Oral Daily  . latanoprost  1 drop Both Eyes QHS  . metoprolol succinate  50 mg Oral Daily  . omega-3 acid ethyl  esters  1 g Oral Daily  . potassium chloride  40 mEq Oral Daily  . sodium chloride flush  3 mL Intravenous Q12H  . tamsulosin  0.4 mg Oral Daily  . cyanocobalamin  100 mcg Oral Daily    Continuous Infusions:   PRN Meds: acetaminophen, hydrALAZINE, nitroGLYCERIN, OLANZapine zydis, sodium chloride flush            Vital Signs: BP 123/90 (BP Location: Right Arm)   Pulse 88   Temp (!) 97.5 F (36.4 C) (Oral)   Resp 20   Ht 5\' 9"  (1.753 m)  Wt 66.8 kg   SpO2 95%   BMI 21.75 kg/m  SpO2: SpO2: 95 % O2 Device: O2 Device: Room Air O2 Flow Rate: O2 Flow Rate (L/min): 2 L/min  Intake/output summary:   Intake/Output Summary (Last 24 hours) at 03/01/2020 1413 Last data filed at 03/01/2020 0900 Gross per 24 hour  Intake 837 ml  Output 1050 ml  Net -213 ml   LBM: Last BM Date: 02/27/20 Baseline Weight: Weight: 83 kg Most recent weight: Weight: 66.8 kg       Palliative Assessment/Data: PPS 40%      Patient Active Problem List   Diagnosis Date Noted  . CHF exacerbation ([place]) 02/24/2020  . CHF (congestive heart failure) ([place]) 02/15/2020  . Prolonged QT interval 02/15/2020  . Dementia ([place]) 02/15/2020  . Cellulitis of right leg 02/15/2020  . Goals of care, counseling/discussion   . Palliative care by specialist   . Persistent atrial fibrillation ([place])   . Coronary artery disease   . Acute systolic heart failure ([place]) 08/31/2019  . Acute on chronic systolic CHF (congestive heart failure) ([place]) 08/31/2019  . AMS (altered mental status) 08/31/2019  . Acute respiratory failure with hypoxia ([place]) 08/31/2019  . Lactic acidosis 08/31/2019  . History of intracerebral hemorrhage without residual deficit 08/31/2019  . Protein-calorie malnutrition, severe 08/08/2019  . Intracerebral hemorrhage ([place]) 08/07/2019  . Acute CHF (congestive heart failure) ([place]) 08/06/2019  . AKI (acute kidney injury) ([place]) 08/06/2019  . Elevated troponin 08/06/2019  . Transaminitis 08/06/2019  .  Closed rib fracture 08/06/2019  . History of CVA (cerebrovascular accident) 08/06/2019  . PAF (paroxysmal atrial fibrillation) ([place]) 10/20/2018  . NSTEMI (non-ST elevated myocardial infarction) ([place])   . Acute CVA (cerebrovascular accident) ([place]) 02/14/2018  . TIA (transient ischemic attack) 02/13/2018  . Hepatitis C reactive 02/06/2011  . Hyponatremia 02/06/2011  . Postoperative anemia due to acute blood loss 02/05/2011  . Hypokalemia 02/05/2011  . Osteoarthritis of hip 01/27/2011  . Hypertension 01/27/2011  . Sleep apnea 01/27/2011  . Stroke/cerebrovascular accident ([place]) 01/27/2011  . Cancer of prostate [facility]) 01/27/2011    Palliative Care Assessment & Plan   Patient Profile: 87 y.o.malewith past medical history of CAD, NSTEMI, CHF with EF 20%, atrial fibrillation, CVA with residual right sided weakness, ICH, prostate cancer, CKD, and dementia presented to the ED on 02/24/20 from [facility] with complaints of leg swelling and shortness of breath. Patient was recently hospitalized from 2/10-2/15 for CHF exacerbation. He isadmitted on 2/19/2022with acute on chronic CHF exacerbation, acute on chronic respiratory failure with hypoxia, and acute on chronic CKD.  Assessment: Acute on chronic systolic HF  Acute on chronic hypoxic respiratory failure Acute on chronic CKD stage 3b Dementia Paroxysmal atrial fibrillation  Recommendations/Plan:  Continue current medical treatment  Continue full code status - family continuing discussions tonight, DNR/DNI recommended  PMT will reach out to daughter/Laura tomorrow morning 2/26 for final decision on code status  Patient has been accepted at [facility] in [place] - patient will transport via private vehicle. Family agreeable for discharge tomorrow 2/26  PMT will continue to follow and support holistically  Goals of Care and Additional Recommendations:  Limitations on Scope of Treatment: Full Scope Treatment  Code  Status:    Code Status Orders  (From admission, onward)         Start     Ordered   02/24/20 1455  Full code  Continuous        02/24/20 1457  Code Status History    Date Active Date Inactive Code Status Order ID Comments User Context   02/15/2020 1438 02/20/2020 1835 Full Code [id number]  Norval Morton, MD ED   08/31/2019 2240 09/07/2019 1313 Full Code [id number]  Orene Desanctis, DO ED   08/07/2019 2231 08/11/2019 1720 Full Code [id number]  Minda Ditto, NP ED   08/06/2019 2201 08/07/2019 2230 Full Code [id number]  Orene Desanctis, DO ED   10/19/2018 0544 10/20/2018 1814 Full Code [id number]  Meade Maw, MD ED   02/13/2018 1232 02/15/2018 2027 Full Code [id number]  Barb Merino, MD Inpatient   02/03/2011 1510 02/06/2011 1425 Full Code [id number]  Theda Sers, Margarette Asal., RN Inpatient   Advance Care Planning Activity       Prognosis:   < 6 months  Discharge Planning:  Home with Hospice  Care plan was discussed with [facility], patient's daughter, primary RN, Dr. Starla Link  Thank you for allowing the Palliative Medicine Team to assist in the care of this patient.   Total Time 15 minutes Prolonged Time Billed  no       Greater than 50%  of this time was spent counseling and coordinating care related to the above assessment and plan.  Lin Landsman, NP  Please contact Palliative Medicine Team phone at [phone] for questions and concerns.

## 2020-03-01 NOTE — TOC Progression Note (Signed)
Transition of Care [facility]) - Progression Note    Patient Details  Name: Brian Brown MRN: [id number] Date of Birth: 1932-08-30  Transition of Care [facility]) CM/SW Contact  Graves-Bigelow, Ocie Cornfield, RN Phone Number: 03/01/2020, 2:41 PM  Clinical Narrative:  Case Manager received a call from [facility] Liaison stating that the patient was not approved for hospice services. Additional paperwork was needed. Case Manager was able to submit the needed information and submitted. Received call back from [facility] Liaison at [phone] and stated with the additional information the patient has been approved for [facility] and Lilyan Punt would reach out to daughter [name]. Durable medical equipment ordered via Hospice Wheelchair, shower chair, and bedside commode. Daughter to bring the wheelchair to the hospital to transport patient. MD aware that the plan for discharge will be on Saturday, family will arrive around noon for transport. Transitions of care team please fax discharge summary to [phone] and if you have additional questions please call Mona at number listed above. Palliative NP to discuss with patient about code status.   Expected Discharge Plan: Home w Hospice Care Barriers to Discharge: Continued Medical Work up  Expected Discharge Plan and Services Expected Discharge Plan: [place] In-house Referral: Hospice / Palliative Care Discharge Planning Services: CM Consult Post Acute Care Choice: Hospice Living arrangements for the past 2 months: Single Family Home                   DME Agency: NA       HH Arranged: RN [place] Agency: Other - See comment ([place]) Date HH Agency Contacted: 02/29/20 Time HH Agency Contacted: 1400 Representative spoke with at [place]: Iona Beard- information faxed and agency will review   Readmission Risk Interventions Readmission Risk Prevention Plan 02/29/2020 09/04/2019  Transportation  Screening Complete Complete  PCP or Specialist Appt within 3-5 Days - Complete  HRI or [place] - Complete  Social Work Consult for [place] Planning/Counseling - Complete  Palliative Care Screening - Not Applicable  Medication Review Press photographer) Complete Complete  PCP or Specialist appointment within 3-5 days of discharge Complete -  [place] or Home Care Consult Complete -  SW Recovery Care/Counseling Consult Complete -  Palliative Care Screening Complete -  [place] Not Applicable -  Some recent data might be hidden

## 2020-03-02 DIAGNOSIS — I5023 Acute on chronic systolic (congestive) heart failure: Secondary | ICD-10-CM | POA: Diagnosis not present

## 2020-03-02 DIAGNOSIS — I480 Paroxysmal atrial fibrillation: Secondary | ICD-10-CM | POA: Diagnosis not present

## 2020-03-02 DIAGNOSIS — N179 Acute kidney failure, unspecified: Secondary | ICD-10-CM | POA: Diagnosis not present

## 2020-03-02 DIAGNOSIS — F0390 Unspecified dementia without behavioral disturbance: Secondary | ICD-10-CM | POA: Diagnosis not present

## 2020-03-02 DIAGNOSIS — I214 Non-ST elevation (NSTEMI) myocardial infarction: Secondary | ICD-10-CM | POA: Diagnosis not present

## 2020-03-02 LAB — BASIC METABOLIC PANEL
Anion gap: 9 (ref 5–15)
BUN: 36 mg/dL — ABNORMAL HIGH (ref 8–23)
CO2: 26 mmol/L (ref 22–32)
Calcium: 8.6 mg/dL — ABNORMAL LOW (ref 8.9–10.3)
Chloride: 103 mmol/L (ref 98–111)
Creatinine, Ser: 1.68 mg/dL — ABNORMAL HIGH (ref 0.61–1.24)
GFR, Estimated: 40 mL/min — ABNORMAL LOW (ref >60)
Glucose, Bld: 96 mg/dL (ref 70–99)
Potassium: 3.7 mmol/L (ref 3.5–5.1)
Sodium: 138 mmol/L (ref 135–145)

## 2020-03-02 LAB — MAGNESIUM: Magnesium: 1.9 mg/dL (ref 1.7–2.4)

## 2020-03-02 MED ORDER — OLANZAPINE 5 MG PO TBDP: 5.0000 mg | ORAL_TABLET | Freq: Two times a day (BID) | ORAL | 0 refills | Status: DC | PRN

## 2020-03-02 MED ORDER — LORAZEPAM 0.5 MG PO TABS: 0.5000 mg | ORAL_TABLET | ORAL | 0 refills | Status: AC | PRN

## 2020-03-02 MED ORDER — TAMSULOSIN HCL 0.4 MG PO CAPS: 0.4000 mg | ORAL_CAPSULE | Freq: Every day | ORAL | 0 refills | Status: DC

## 2020-03-02 MED ORDER — FUROSEMIDE 80 MG PO TABS: 80.0000 mg | ORAL_TABLET | Freq: Every day | ORAL | 0 refills | Status: AC

## 2020-03-02 MED ORDER — METOPROLOL SUCCINATE ER 25 MG PO TB24: 50.0000 mg | ORAL_TABLET | Freq: Every day | ORAL | 0 refills | Status: AC

## 2020-03-02 MED ORDER — POTASSIUM CHLORIDE CRYS ER 20 MEQ PO TBCR: 40.0000 meq | EXTENDED_RELEASE_TABLET | Freq: Every day | ORAL | 0 refills | Status: AC

## 2020-03-02 MED ORDER — FUROSEMIDE 80 MG PO TABS: 80.0000 mg | ORAL_TABLET | Freq: Every day | ORAL | 0 refills | Status: DC

## 2020-03-02 MED ORDER — POTASSIUM CHLORIDE CRYS ER 20 MEQ PO TBCR: 40.0000 meq | EXTENDED_RELEASE_TABLET | Freq: Every day | ORAL | 0 refills | Status: DC

## 2020-03-02 MED ORDER — TAMSULOSIN HCL 0.4 MG PO CAPS: 0.4000 mg | ORAL_CAPSULE | Freq: Every day | ORAL | 0 refills | Status: AC

## 2020-03-02 MED ORDER — OLANZAPINE 5 MG PO TBDP: 5.0000 mg | ORAL_TABLET | Freq: Two times a day (BID) | ORAL | 0 refills | Status: AC | PRN

## 2020-03-02 NOTE — TOC Progression Note (Signed)
Transition of Care [facility]) - Progression Note    Patient Details  Name: Brian Brown MRN: [id number] Date of Birth: 06-09-1932  Transition of Care [facility]) CM/SW Contact  Ella Bodo, RN Phone Number: 03/02/2020, 11:05 AM  Clinical Narrative:  Patient for discharge home with daughter to [place] today, with home Hospice follow up.  Notified [facility]) of patient discharge today, and faxed discharge summary to Hospice agency. (fax [phone]). Family plans to have decision about code status when they arrive today; please notify PMT nurse practitioner if patient needs out of facility DNR form.       Expected Discharge Plan: Home w Hospice Care Barriers to Discharge: Continued Medical Work up  Expected Discharge Plan and Services Expected Discharge Plan: [place] In-house Referral: Hospice / Palliative Care Discharge Planning Services: CM Consult Post Acute Care Choice: Hospice Living arrangements for the past 2 months: Single Family Home Expected Discharge Date: 03/02/20                 DME Agency: NA       HH Arranged: RN [place] Agency: Other - See comment ([place]) Date HH Agency Contacted: 02/29/20 Time HH Agency Contacted: 1400 Representative spoke with at [place]: Iona Beard- information faxed and agency will review   Social Determinants of Health (SDOH) Interventions    Readmission Risk Interventions Readmission Risk Prevention Plan 02/29/2020 09/04/2019  Transportation Screening Complete Complete  PCP or Specialist Appt within 3-5 Days - Complete  HRI or [place] - Complete  Social Work Consult for [place] Planning/Counseling - Complete  Palliative Care Screening - Not Applicable  Medication Review Press photographer) Complete Complete  PCP or Specialist appointment within 3-5 days of discharge Complete -  [place] or Home Care Consult Complete -  SW Recovery Care/Counseling Consult Complete -  Palliative Care  Screening Complete -  [place] Not Applicable -  Some recent data might be hidden   Reinaldo Raddle, RN, BSN  Trauma/Neuro ICU Case Manager [phone]

## 2020-03-02 NOTE — Progress Notes (Signed)
Progress Note  Patient Name: Brian Brown Date of Encounter: 03/02/2020  [facility] Cardiologist: Larae Grooms, MD   Subjective   No CP or dyspnea  Inpatient Medications    Scheduled Meds: . apixaban  2.5 mg Oral BID  . atorvastatin  40 mg Oral Daily  . famotidine  20 mg Oral QHS  . furosemide  80 mg Oral Daily  . latanoprost  1 drop Both Eyes QHS  . metoprolol succinate  50 mg Oral Daily  . omega-3 acid ethyl esters  1 g Oral Daily  . potassium chloride  40 mEq Oral Daily  . sodium chloride flush  3 mL Intravenous Q12H  . tamsulosin  0.4 mg Oral Daily  . cyanocobalamin  100 mcg Oral Daily   Continuous Infusions:  PRN Meds: acetaminophen, hydrALAZINE, nitroGLYCERIN, OLANZapine zydis, sodium chloride flush   Vital Signs    Vitals:   03/01/20 1929 03/02/20 0011 03/02/20 0604 03/02/20 0700  BP: 105/79 95/71 110/89 108/79  Pulse: 83 80 71 74  Resp: 16 20 18 16   Temp: 97.6 F (36.4 C) 97.9 F (36.6 C) (!) 97.5 F (36.4 C) (!) 97.2 F (36.2 C)  TempSrc: Oral Oral Oral Axillary  SpO2: 98%     Weight:   67.7 kg   Height:        Intake/Output Summary (Last 24 hours) at 03/02/2020 0842 Last data filed at 03/02/2020 0500 Gross per 24 hour  Intake 240 ml  Output 700 ml  Net -460 ml   Last 3 Weights 03/02/2020 03/01/2020 02/28/2020  Weight (lbs) 149 lb 4.8 oz 147 lb 4.8 oz 154 lb 1.6 oz  Weight (kg) 67.722 kg 66.815 kg 69.899 kg      Telemetry    Atrial fibrillation with PVCs, rate controlled - Personally Reviewed  Physical Exam   GEN: No acute distress.   Neck: No JVD Cardiac: irregular Respiratory: Clear to auscultation bilaterally. GI: Soft, nontender, non-distended  MS: No edema Neuro:  Nonfocal  Psych: Normal affect   Labs     Chemistry Recent Labs  Lab 02/29/20 0246 03/01/20 0139 03/02/20 0231  NA 145 144 138  K 3.8 3.5 3.7  CL 104 102 103  CO2 28 31 26   GLUCOSE 99 83 96  BUN 28* 33* 36*  CREATININE 1.66* 1.77* 1.68*   CALCIUM 9.0 8.8* 8.6*  GFRNONAA 40* 37* 40*  ANIONGAP 13 11 9      Hematology Recent Labs  Lab 02/24/20 1121 02/24/20 1452 02/28/20 0300  WBC 7.7  --  6.5  RBC 4.84  --  4.43  HGB 14.3 13.9 13.1  HCT 44.1 41.0 40.2  MCV 91.1  --  90.7  MCH 29.5  --  29.6  MCHC 32.4  --  32.6  RDW 14.9  --  14.7  PLT 200  --  185    BNP Recent Labs  Lab 02/24/20 1121  BNP 2,617.0*     Patient Profile     87 y.o. male a hx ofCAD s/p NSTEMI 10/2018, CVA x 2 (last 02/2018), PAF, HTN, HLD, ICM/HFrEF (EF 21% 08/2019), CKD II, and R thalamic bleed (08/2019) prompting discontinuationof Eliquis and Plavix therapy, who is being seen for the evaluation ofacute on chronic systolic heart failure.  Echocardiogram shows ejection fraction less than 20%, mild left ventricular hypertrophy, moderate RV dysfunction, moderate pulmonary hypertension, biatrial enlargement, moderate mitral vegetation, moderate tricuspid regurgitation.  Assessment & Plan    1 acute on chronic systolic congestive heart  failure-patient is -27 L since admission.  He appears to be euvolemic on examination today.  Continue Lasix at present dose.  He can take an additional 80 mg daily for worsening dyspnea or weight gain of 2 to 3 pounds.  We will check potassium and renal function 1 week following discharge.  2 cardiomyopathy-continue Toprol.  He is not on an ARB due to worsening renal function and borderline blood pressure.  This can be considered as an outpatient.  3 atrial fibrillation-continue beta-blocker for rate control.  Continue apixaban.  4 coronary artery disease-continue statin.  5 chronic stage III kidney disease-as above check potassium and renal function 1 week after discharge.  Cardiology will sign off.  Please call with questions.  Continue present medications at discharge.  For questions or updates, please contact [place] Please consult [email or web address] for contact info under        Signed, Kirk Ruths, MD  03/02/2020, 8:42 AM

## 2020-03-02 NOTE — Progress Notes (Addendum)
Daily Progress Note   Patient Name: Brian Brown       Date: 03/02/2020 DOB: January 27, 1932  Age: 87 y.o. MRN#: [id number] Attending Physician: Aline August, MD Primary Care Physician: Seward Carol, MD Admit Date: 02/24/2020  Reason for Consultation/Follow-up: Disposition and Establishing goals of care  Subjective: Chart review performed. Received updates from primary RN, TOC, and Dr. Starla Link - plan for patient discharge today, daughters arriving at 12p.  Called daughter/Laura - she and her two sisters are all planning to arrive at hospital at 12p and requested they could all be present for discharge instructions - explained I will notify RN of their request, which is reasonable. [name] states family and patient will remain in [facility] and will start traveling to [place] tomorrow, she asks about where patient's medications will be sent as they would like to pick up medications this afternoon. I explained they had originally arranged for medications to go to [place] - I updated in system [facility] preferred [facility] [place]. [name] inquired about medications to help keep the patient calm during travel - she states he had received ativan, which helped keep him calm - stated I would place orders for ativan at discharge to assist with traveling. [name] and her sisters are still discussing code status - encouraged and explained why DNR/DNI is important and I would like to have DNR form completed before he leaves if that is their wish. [name] states they will have decision for DNR once they arrive at noon.  All questions and concerns addressed. Encouraged to call with questions and/or concerns. PMT card previously provided.  Asked primary RN to notify me if family wishes for DNR form completion - I will  complete before patient is discharged.   Length of Stay: 7  Current Medications: Scheduled Meds:  . apixaban  2.5 mg Oral BID  . atorvastatin  40 mg Oral Daily  . famotidine  20 mg Oral QHS  . furosemide  80 mg Oral Daily  . latanoprost  1 drop Both Eyes QHS  . metoprolol succinate  50 mg Oral Daily  . omega-3 acid ethyl esters  1 g Oral Daily  . potassium chloride  40 mEq Oral Daily  . sodium chloride flush  3 mL Intravenous Q12H  . tamsulosin  0.4 mg Oral Daily  .  cyanocobalamin  100 mcg Oral Daily    Continuous Infusions:   PRN Meds: acetaminophen, hydrALAZINE, nitroGLYCERIN, OLANZapine zydis, sodium chloride flush          Vital Signs: BP 108/79 (BP Location: Right Arm)   Pulse 74   Temp (!) 97.2 F (36.2 C) (Axillary)   Resp 16   Ht 5\' 9"  (1.753 m)   Wt 67.7 kg   SpO2 98%   BMI 22.05 kg/m  SpO2: SpO2: 98 % O2 Device: O2 Device: Room Air O2 Flow Rate: O2 Flow Rate (L/min): 2 L/min  Intake/output summary:   Intake/Output Summary (Last 24 hours) at 03/02/2020 1029 Last data filed at 03/02/2020 0700 Gross per 24 hour  Intake 400 ml  Output 700 ml  Net -300 ml   LBM: Last BM Date:  (patient unable to recall) Baseline Weight: Weight: 83 kg Most recent weight: Weight: 67.7 kg       Palliative Assessment/Data: 40%      Patient Active Problem List   Diagnosis Date Noted  . CHF exacerbation ([place]) 02/24/2020  . CHF (congestive heart failure) ([place]) 02/15/2020  . Prolonged QT interval 02/15/2020  . Dementia ([place]) 02/15/2020  . Cellulitis of right leg 02/15/2020  . Goals of care, counseling/discussion   . Palliative care by specialist   . Persistent atrial fibrillation ([place])   . Coronary artery disease   . Acute systolic heart failure ([place]) 08/31/2019  . Acute on chronic systolic CHF (congestive heart failure) ([place]) 08/31/2019  . AMS (altered mental status) 08/31/2019  . Acute respiratory failure with hypoxia ([place]) 08/31/2019  . Lactic acidosis  08/31/2019  . History of intracerebral hemorrhage without residual deficit 08/31/2019  . Protein-calorie malnutrition, severe 08/08/2019  . Intracerebral hemorrhage ([place]) 08/07/2019  . Acute CHF (congestive heart failure) ([place]) 08/06/2019  . AKI (acute kidney injury) ([place]) 08/06/2019  . Elevated troponin 08/06/2019  . Transaminitis 08/06/2019  . Closed rib fracture 08/06/2019  . History of CVA (cerebrovascular accident) 08/06/2019  . PAF (paroxysmal atrial fibrillation) ([place]) 10/20/2018  . NSTEMI (non-ST elevated myocardial infarction) ([place])   . Acute CVA (cerebrovascular accident) ([place]) 02/14/2018  . TIA (transient ischemic attack) 02/13/2018  . Hepatitis C reactive 02/06/2011  . Hyponatremia 02/06/2011  . Postoperative anemia due to acute blood loss 02/05/2011  . Hypokalemia 02/05/2011  . Osteoarthritis of hip 01/27/2011  . Hypertension 01/27/2011  . Sleep apnea 01/27/2011  . Stroke/cerebrovascular accident ([place]) 01/27/2011  . Cancer of prostate [facility]) 01/27/2011    Palliative Care Assessment & Plan   Patient Profile: 87 y.o.malewith past medical history of CAD, NSTEMI, CHF with EF 20%, atrial fibrillation, CVA with residual right sided weakness, ICH, prostate cancer, CKD, and dementia presented to the ED on 02/24/20 from [facility] with complaints of leg swelling and shortness of breath. Patient was recently hospitalized from 2/10-2/15 for CHF exacerbation. He isadmitted on 2/19/2022with acute on chronic CHF exacerbation, acute on chronic respiratory failure with hypoxia, and acute on chronic CKD.  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 Went to patient's bedside - 3 daughters present. Answered questions around discharge medications and pharmacy scrips will be sent to. I asked if daughters had discussed code status - they state they have not. Strongly encouraged DNR form completion before patient discharges and begins travel to [place]. Family asked to see what DNR form looked like - provided form for their review. I explained again that DNR/DNI only comes into effect after a person has arrested (died).  It is a protective measure to keep [place] from harming the patient in their last moments of life and aligns with hospice philosophy. Daughters attempted to gain insight from patient for his wishes; however, he is unable to answer complex medical decisions - his answers were not appropriate to the questions being asked. Daughters asked me to step out of the room so they could discuss. I stepped into the hall to provide space for family to talk. When they asked me to re-enter room, daughters are not agreeable for DNR at this time. All questions and concerns addressed. Family are ready for patient discharge - family state they will start travel to [place] tomorrow.   Goals of Care and Additional Recommendations:  Limitations on Scope of Treatment: Full Scope Treatment  Code Status:    Code Status Orders  (From admission, onward)         Start     Ordered   02/24/20 1455  Full code  Continuous        02/24/20 1457        Code Status History    Date Active Date Inactive Code Status Order ID Comments User Context   02/15/2020 1438 02/20/2020 [place] Full Code [id number]  Norval Morton, MD ED   08/31/2019 2240 09/07/2019 1313 Full Code [id number]  Orene Desanctis, DO ED   08/07/2019 2231 08/11/2019 1720 Full Code [id number]  Minda Ditto, NP ED   08/06/2019 2201  08/07/2019 2230 Full Code [id number]  Orene Desanctis, DO ED   10/19/2018 0544 10/20/2018 1814 Full Code [id number]  Meade Maw, MD ED   02/13/2018 1232 02/15/2018 2027 Full Code [id number]  Barb Merino, MD Inpatient   02/03/2011 1510 02/06/2011 1425 Full Code [id number]  Theda Sers, Margarette Asal., RN Inpatient   Advance Care Planning Activity       Prognosis:   < 6 months  Discharge Planning:  Home with Hospice  Care plan was discussed with primary RN, Dr. Starla Link, [facility], patient's daughter  Thank you for allowing the Palliative Medicine Team to assist in the care of this patient.   Total Time 50 minutes Prolonged Time Billed  no       Greater than 50%  of this time was spent counseling and coordinating care related to the above assessment and plan.  Lin Landsman, NP  Please contact Palliative Medicine Team phone at [phone] for questions and concerns.

## 2020-03-02 NOTE — Discharge Summary (Addendum)
Physician Discharge Summary  Brian Brown LGX:[id number] DOB: 1932-01-14 DOA: 02/24/2020  PCP: Seward Carol, MD  Admit date: 02/24/2020 Discharge date: 03/02/2020  Admitted From: Home Disposition: Home  Recommendations for Outpatient Follow-up:  1. Follow up with PCP in 1 week with repeat CBC/BMP 2. Outpatient follow-up with home hospice at earliest convenience.  Recommend total comfort measures if condition does not improve.  Also recommend changing CODE STATUS to DNR. 3. Follow up in ED if symptoms worsen or new appear   Home Health: Home hospice  equipment/Devices: None  Discharge Condition: Guarded to poor CODE STATUS: Full Diet recommendation: Heart healthy/fluid restriction of up to 1200 cc a day  Brief/Interim Summary: 87 year old male with history of CAD, non-STEMI, chronic systolic CHF with EF of 81%, paroxysmal A. fib on Eliquis, unspecified CVA with residual right-sided weakness, ICH, hypertension, recent admission from 02/15/20-02/20/20 for CHF exacerbation and subsequently discharged to SNF presented with worsening dyspnea. On presentation, he was hypoxic requiring supplemental oxygen. He was started on intravenous Lasix. Chest x-ray showed pulmonary vascular congestion. Cardiology and palliative care were consulted.  During the hospitalization, patient has diuresed well and is currently on room air.  Palliative care has had multiple discussions with family members regarding overall poor prognosis.  Family has agreed for discharge home with hospice but patient's CODE STATUS is still full code.  Recommend changing CODE STATUS to DNR.  If condition were to worsen, recommend total comfort measures.  Cardiology is recommending Lasix 80 mg daily with outpatient follow-up and has signed off.  Family is planning to take patient to [place] with home hospice.  He will be discharged today.  Discharge Diagnoses:   Acute on chronic systolic heart failure -Echo on 2020-02-16 showed EF  of 20%. -Cardiology following and managing diuretics.  IV Lasix has been switched to oral Lasix on 02/29/2020 by cardiology.  Continue metoprolol.  Continue compliance with diet and fluid restriction upon discharge.  Negative balance of 27,394.1 cc since admission -Cardiology has cleared the patient for discharge on Lasix 80 mg daily.  Continue metoprolol succinate on discharge -Discharge patient today with outpatient follow-up with PCP and cardiology if needed  Acute on chronic hypoxic respiratory failure -Patient was supposed to be discharged on 3 L oxygen. He presented very hypoxic and required 15 L oxygen at some point -Respiratory status has much improved.  Currently on room air.  Acute on chronic kidney disease stage IIIb -Baseline creatinine of 1.3-1.4 -Creatinine improving; 1.68 today.  Outpatient follow-up if family still interested in following up renal function  Hypokalemia -Improved.  Continue replacement  Paroxysmal A. Fib -Currently rate controlled. Continue Eliquis and metoprolol  CAD with history of non-STEMI -Stable. Continue beta-blocker and statin  Hypertension -Blood pressure stable. Continue beta-blocker and Lasix  Hyperlipidemia -Continue statin  Dementia with memory deficits -Palliative care has had multiple discussions with family members regarding overall poor prognosis.  Family has agreed for discharge home with hospice but patient's CODE STATUS is still full code.  Recommend changing CODE STATUS to DNR.  If condition were to worsen, recommend total comfort measures.  - Family is planning to take patient to [place] with home hospice.  History of unspecified CVA/ICH -Continue statin/anticoagulation  Discharge Instructions  Discharge Instructions    Diet - low sodium heart healthy   Complete by: As directed    Fluid restriction of up to 1200 cc a day   Increase activity slowly   Complete by: As directed      Allergies  as of 03/02/2020   No  Known Allergies     Medication List    STOP taking these medications   metoprolol tartrate 25 MG tablet Commonly known as: LOPRESSOR   potassium chloride 10 MEQ tablet Commonly known as: KLOR-CON Replaced by: potassium chloride SA 20 MEQ tablet   sulfamethoxazole-trimethoprim 800-160 MG tablet Commonly known as: BACTRIM DS     TAKE these medications   acetaminophen 325 MG tablet Commonly known as: TYLENOL Take 650 mg by mouth every 6 (six) hours as needed for mild pain or headache.   apixaban 2.5 MG Tabs tablet Commonly known as: ELIQUIS Take 1 tablet (2.5 mg total) by mouth 2 (two) times daily.   atorvastatin 40 MG tablet Commonly known as: LIPITOR TAKE 1 TABLET EVERY DAY What changed: when to take this   cyanocobalamin 100 MCG tablet Take 1 tablet (100 mcg total) by mouth daily. What changed: when to take this   famotidine 20 MG tablet Commonly known as: PEPCID Take 1 tablet (20 mg total) by mouth at bedtime.   Fish Oil 1000 MG Caps Take 1,000 mg by mouth daily.   furosemide 80 MG tablet Commonly known as: LASIX Take 1 tablet (80 mg total) by mouth daily. What changed:   medication strength  how much to take  when to take this   latanoprost 0.005 % ophthalmic solution Commonly known as: XALATAN Place 1 drop into both eyes at bedtime.   LORazepam 0.5 MG tablet Commonly known as: ATIVAN Take 1 tablet (0.5 mg total) by mouth every 4 (four) hours as needed for anxiety or sleep.   metoprolol succinate 25 MG 24 hr tablet Commonly known as: TOPROL-XL Take 2 tablets (50 mg total) by mouth daily. What changed:   how much to take  when to take this   multivitamin with minerals Tabs tablet Take 1 tablet by mouth daily.   nitroGLYCERIN 0.4 MG SL tablet Commonly known as: NITROSTAT Place 1 tablet (0.4 mg total) under the tongue every 5 (five) minutes x 3 doses as needed for chest pain. What changed:   when to take this  reasons to take this    OLANZapine zydis 5 MG disintegrating tablet Commonly known as: ZYPREXA Take 1 tablet (5 mg total) by mouth 2 (two) times daily as needed (agitation).   potassium chloride SA 20 MEQ tablet Commonly known as: KLOR-CON Take 2 tablets (40 mEq total) by mouth daily. Replaces: potassium chloride 10 MEQ tablet   tamsulosin 0.4 MG Caps capsule Commonly known as: FLOMAX Take 1 capsule (0.4 mg total) by mouth daily.        Follow-up [facility].   Why: Home Hospice Registered Nurse to call with visit times Contact information: [address] [place], [place] [address] tel:[phone]              No Known Allergies  Consultations:  Cardiology/palliative care   Procedures/Studies: DG Chest 2 View  Result Date: 02/24/2020 CLINICAL DATA:  Shortness of breath. EXAM: CHEST - 2 VIEW COMPARISON:  02/15/2020. FINDINGS: 1148 hours. The cardio pericardial silhouette is enlarged. There is pulmonary vascular congestion without overt pulmonary edema. Increasing bibasilar collapse/consolidation with small bilateral pleural effusions. Bones are diffusely demineralized. IMPRESSION: Cardiomegaly with vascular congestion. Bibasilar atelectasis/infiltrate with small bilateral pleural effusions. Electronically Signed   By: Misty Stanley M.D.   On: 02/24/2020 12:09   DG Chest Port 1 View  Result Date: 02/15/2020 CLINICAL DATA:  Shortness of breath. EXAM: PORTABLE CHEST 1 VIEW COMPARISON:  Multiple priors, most recent September 05, 2019. FINDINGS: Enlarged cardiac silhouette with central pulmonary vascular congestion. Loop recorder projects over the left chest. Both lungs are clear. No visible pleural effusions or pneumothorax. No acute osseous abnormality. IMPRESSION: Cardiomegaly and central pulmonary vascular congestion without overt pulmonary edema. Electronically Signed   By: Margaretha Sheffield MD   On: 02/15/2020 12:52   ECHOCARDIOGRAM  COMPLETE  Result Date: 02/16/2020    ECHOCARDIOGRAM REPORT   Patient Name:   Brian Brown Date of Exam: 02/16/2020 Medical Rec #:  [id number]        Height:       69.0 in Accession #:    [id number]       Weight:       193.2 lb Date of Birth:  1932-07-22        BSA:          2.036 m Patient Age:    87 years         BP:           128/100 mmHg Patient Gender: M                HR:           85 bpm. Exam Location:  Inpatient Procedure: 2D Echo Indications:    stroke  History:        Patient has prior history of Echocardiogram examinations, most                 recent 09/05/2019. CAD, Arrythmias:Atrial Fibrillation; Risk                 Factors:Hypertension.  Sonographer:    Johny Chess Referring Phys: [id number] RONDELL A SMITH IMPRESSIONS  1. LV apex well-visualized without contrast, no mural thrombus identified. Left ventricular ejection fraction, by estimation, is <20%. The left ventricle has severely decreased function. The left ventricle demonstrates global hypokinesis. There is mild left ventricular hypertrophy. Left ventricular diastolic function could not be evaluated. Elevated left ventricular end-diastolic pressure.  2. Right ventricular systolic function is moderately reduced. The right ventricular size is mildly enlarged. There is moderately elevated pulmonary artery systolic pressure. The estimated right ventricular systolic pressure is 06.3 mmHg.  3. Left atrial size was moderately dilated.  4. Right atrial size was severely dilated.  5. Moderate pleural effusion in the left lateral region.  6. The mitral valve is abnormal. Moderate mitral valve regurgitation.  7. Tricuspid valve regurgitation is moderate.  8. The aortic valve is tricuspid. Aortic valve regurgitation is not visualized. Mild aortic valve sclerosis is present, with no evidence of aortic valve stenosis.  9. The pulmonic valve was abnormal. 10. Mildly dilated pulmonary artery. 11. The inferior vena cava is dilated in size with <50%  respiratory variability, suggesting right atrial pressure of 15 mmHg. Comparison(s): Changes from prior study are noted. 09/05/2019: LVEF 20%, global hypokinesis, moderate RV systolic dysfunction, RVSP 43.5 mmHg. FINDINGS  Left Ventricle: LV apex well-visualized without contrast, no mural thrombus identified. Left ventricular ejection fraction, by estimation, is <20%. The left ventricle has severely decreased function. The left ventricle demonstrates global hypokinesis. The left ventricular internal cavity size was normal in size. There is mild left ventricular hypertrophy. Left ventricular diastolic function could not be evaluated due to atrial fibrillation. Left ventricular diastolic function could not be evaluated. Elevated left ventricular end-diastolic pressure. Right Ventricle: The right ventricular size is mildly enlarged. No increase in right  ventricular wall thickness. Right ventricular systolic function is moderately reduced. There is moderately elevated pulmonary artery systolic pressure. The tricuspid regurgitant velocity is 2.90 m/s, and with an assumed right atrial pressure of 15 mmHg, the estimated right ventricular systolic pressure is 50.2 mmHg. Left Atrium: Left atrial size was moderately dilated. Right Atrium: Right atrial size was severely dilated. Pericardium: There is no evidence of pericardial effusion. Mitral Valve: The mitral valve is abnormal. There is moderate thickening of the mitral valve leaflet(s). Moderate mitral valve regurgitation. Tricuspid Valve: The tricuspid valve is grossly normal. Tricuspid valve regurgitation is moderate. Aortic Valve: The aortic valve is tricuspid. Aortic valve regurgitation is not visualized. Mild aortic valve sclerosis is present, with no evidence of aortic valve stenosis. Pulmonic Valve: The pulmonic valve was abnormal. Pulmonic valve regurgitation is mild to moderate. Aorta: The aortic root and ascending aorta are structurally normal, with no evidence of  dilitation. Pulmonary Artery: The pulmonary artery is mildly dilated. Venous: The inferior vena cava is dilated in size with less than 50% respiratory variability, suggesting right atrial pressure of 15 mmHg. IAS/Shunts: No atrial level shunt detected by color flow Doppler. Additional Comments: There is a moderate pleural effusion in the left lateral region.  LEFT VENTRICLE PLAX 2D LVIDd:         4.60 cm  Diastology LVIDs:         4.20 cm  LV e' medial:  4.81 cm/s LV PW:         1.10 cm  LV e' lateral: 4.95 cm/s LV IVS:        1.10 cm LVOT diam:     2.00 cm LVOT Area:     3.14 cm  RIGHT VENTRICLE            IVC RV S prime:     5.77 cm/s  IVC diam: 2.70 cm TAPSE (M-mode): 0.9 cm LEFT ATRIUM             Index       RIGHT ATRIUM           Index LA diam:        4.80 cm 2.36 cm/m  RA Area:     29.20 cm LA Vol (A2C):   76.9 ml 37.77 ml/m RA Volume:   109.00 ml 53.54 ml/m LA Vol (A4C):   76.9 ml 37.77 ml/m LA Biplane Vol: 79.7 ml 39.15 ml/m   AORTA Ao Root diam: 3.20 cm Ao Asc diam:  3.60 cm MR Peak grad:    65.9 mmHg   TRICUSPID VALVE MR Mean grad:    41.0 mmHg   TR Peak grad:   33.6 mmHg MR Vmax:         406.00 cm/s TR Vmax:        290.00 cm/s MR Vmean:        297.0 cm/s MR PISA:         0.57 cm    SHUNTS MR PISA Eff ROA: 6 mm       Systemic Diam: 2.00 cm MR PISA Radius:  0.30 cm Lyman Bishop MD Electronically signed by Lyman Bishop MD Signature Date/Time: 02/16/2020/11:35:43 AM    Final    VAS [place] LOWER EXTREMITY VENOUS (DVT) (ONLY [facility] & [facility] 7a-7p)  Result Date: 02/15/2020  Lower Venous DVT Study Indications: Swelling and pain in lower extremities. RT>LT.  Limitations: Patient intolerant to compressions in calf. Comparison Study: No prior studies. Performing Technologist: Darlin Coco RDMS  Examination Guidelines: A complete evaluation  includes B-mode imaging, spectral Doppler, color Doppler, and power Doppler as needed of all accessible portions of each vessel. Bilateral testing is considered an integral  part of a complete examination. Limited examinations for reoccurring indications may be performed as noted. The reflux portion of the exam is performed with the patient in reverse Trendelenburg.  +---------+---------------+---------+-----------+----------+--------------+ RIGHT    CompressibilityPhasicitySpontaneityPropertiesThrombus Aging +---------+---------------+---------+-----------+----------+--------------+ CFV      Full           No       Yes                                 +---------+---------------+---------+-----------+----------+--------------+ SFJ      Full                                                        +---------+---------------+---------+-----------+----------+--------------+ FV Prox  Full                                                        +---------+---------------+---------+-----------+----------+--------------+ FV Mid   Full                                                        +---------+---------------+---------+-----------+----------+--------------+ FV DistalFull                                                        +---------+---------------+---------+-----------+----------+--------------+ PFV      Full                                                        +---------+---------------+---------+-----------+----------+--------------+ POP      Full           No       Yes                                 +---------+---------------+---------+-----------+----------+--------------+ PTV                     No       Yes                                 +---------+---------------+---------+-----------+----------+--------------+ PERO                    No       Yes                                 +---------+---------------+---------+-----------+----------+--------------+   +----+---------------+---------+-----------+----------+--------------+  LEFTCompressibilityPhasicitySpontaneityPropertiesThrombus Aging  +----+---------------+---------+-----------+----------+--------------+ CFV Full           Yes      Yes                                 +----+---------------+---------+-----------+----------+--------------+     Summary: RIGHT: - There is no evidence of deep vein thrombosis in the lower extremity. However, portions of this examination were limited- see technologist comments above.  - Waveforms consistent with elevated right heart pressures. - Ultrasound characteristics of enlarged lymph nodes are noted in the groin.  LEFT: - No evidence of common femoral vein obstruction. - Ultrasound characteristics of enlarged lymph nodes noted in the groin.  *See table(s) above for measurements and observations. Electronically signed by Servando Snare MD on 02/15/2020 at 4:20:21 PM.    Final        Subjective: Patient seen and examined at bedside.  Poor historian.  Denies current chest pain or worsening shortness of breath.  Slightly confused.  Discharge Exam: Vitals:   03/02/20 0604 03/02/20 0700  BP: 110/89 108/79  Pulse: 71 74  Resp: 18 16  Temp: (!) 97.5 F (36.4 C) (!) 97.2 F (36.2 C)  SpO2:      General: Pt is elderly male sitting on chair.  Awake but confused.  Currently on room air.  Poor historian.   Cardiovascular: rate controlled, S1/S2 + Respiratory: bilateral decreased breath sounds at bases with some scattered crackles Abdominal: Soft, NT, ND, bowel sounds + Extremities: Trace lower extremity edema; no cyanosis    The results of significant diagnostics from this hospitalization (including imaging, microbiology, ancillary and laboratory) are listed below for reference.     Microbiology: Recent Results (from the past 240 hour(s))  Resp Panel by RT-PCR (Flu A&B, Covid) Nasopharyngeal Swab     Status: None   Collection Time: 02/24/20 12:53 PM   Specimen: Nasopharyngeal Swab; Nasopharyngeal(NP) swabs in vial transport medium  Result Value Ref Range Status   SARS Coronavirus 2 by  RT PCR NEGATIVE NEGATIVE Final    Comment: (NOTE) SARS-CoV-2 target nucleic acids are NOT DETECTED.  The SARS-CoV-2 RNA is generally detectable in upper respiratory specimens during the acute phase of infection. The lowest concentration of SARS-CoV-2 viral copies this assay can detect is 138 copies/mL. A negative result does not preclude SARS-Cov-2 infection and should not be used as the sole basis for treatment or other patient management decisions. A negative result may occur with  improper specimen collection/handling, submission of specimen other than nasopharyngeal swab, presence of viral mutation(s) within the areas targeted by this assay, and inadequate number of viral copies(<138 copies/mL). A negative result must be combined with clinical observations, patient history, and epidemiological information. The expected result is Negative.  Fact Sheet for Patients:  EntrepreneurPulse.com.au  Fact Sheet for Healthcare Providers:  IncredibleEmployment.be  This test is no t yet approved or cleared by the [place] FDA and  has been authorized for detection and/or diagnosis of SARS-CoV-2 by FDA under an Emergency Use Authorization (EUA). This EUA will remain  in effect (meaning this test can be used) for the duration of the COVID-19 declaration under Section 564(b)(1) of the Act, 21 U.S.C.section 360bbb-3(b)(1), unless the authorization is terminated  or revoked sooner.       Influenza A by PCR NEGATIVE NEGATIVE Final   Influenza B by PCR NEGATIVE NEGATIVE Final    Comment: (NOTE) The Xpert Xpress SARS-CoV-2/FLU/RSV plus  assay is intended as an aid in the diagnosis of influenza from Nasopharyngeal swab specimens and should not be used as a sole basis for treatment. Nasal washings and aspirates are unacceptable for Xpert Xpress SARS-CoV-2/FLU/RSV testing.  Fact Sheet for Patients: EntrepreneurPulse.com.au  Fact Sheet  for Healthcare Providers: IncredibleEmployment.be  This test is not yet approved or cleared by the [place] FDA and has been authorized for detection and/or diagnosis of SARS-CoV-2 by FDA under an Emergency Use Authorization (EUA). This EUA will remain in effect (meaning this test can be used) for the duration of the COVID-19 declaration under Section 564(b)(1) of the Act, 21 U.S.C. section 360bbb-3(b)(1), unless the authorization is terminated or revoked.  Performed at [facility], [place] [address], [place], [place] [address]      Labs: BNP (last 3 results) Recent Labs    02/15/20 1202 02/19/20 0855 02/24/20 1121  BNP 2,983.2* 1,925.5* 3,086.5*   Basic Metabolic Panel: Recent Labs  Lab 02/27/20 0229 02/28/20 0300 02/29/20 0246 03/01/20 0139 03/02/20 0231  NA 145 143 145 144 138  K 3.5 3.2* 3.8 3.5 3.7  CL 101 99 104 102 103  CO2 35* 32 28 31 26   GLUCOSE 104* 99 99 83 96  BUN 34* 33* 28* 33* 36*  CREATININE 1.84* 1.70* 1.66* 1.77* 1.68*  CALCIUM 9.2 9.0 9.0 8.8* 8.6*  MG 2.0 1.9 2.1 2.2 1.9   Liver Function Tests: No results for input(s): AST, ALT, ALKPHOS, BILITOT, PROT, ALBUMIN in the last 168 hours. No results for input(s): LIPASE, AMYLASE in the last 168 hours. No results for input(s): AMMONIA in the last 168 hours. CBC: Recent Labs  Lab 02/24/20 1121 02/24/20 1452 02/28/20 0300  WBC 7.7  --  6.5  NEUTROABS  --   --  4.5  HGB 14.3 13.9 13.1  HCT 44.1 41.0 40.2  MCV 91.1  --  90.7  PLT 200  --  185   Cardiac Enzymes: No results for input(s): CKTOTAL, CKMB, CKMBINDEX, TROPONINI in the last 168 hours. BNP: Invalid input(s): POCBNP CBG: Recent Labs  Lab 02/25/20 0749  GLUCAP 99   D-Dimer No results for input(s): DDIMER in the last 72 hours. Hgb A1c No results for input(s): HGBA1C in the last 72 hours. Lipid Profile No results for input(s): CHOL, HDL, LDLCALC, TRIG, CHOLHDL, LDLDIRECT in the last 72 hours. Thyroid  function studies No results for input(s): TSH, T4TOTAL, T3FREE, THYROIDAB in the last 72 hours.  Invalid input(s): FREET3 Anemia work up No results for input(s): VITAMINB12, FOLATE, FERRITIN, TIBC, IRON, RETICCTPCT in the last 72 hours. Urinalysis    Component Value Date/Time   COLORURINE AMBER (A) 08/31/2019 1322   APPEARANCEUR HAZY (A) 08/31/2019 1322   LABSPEC 1.025 08/31/2019 1322   PHURINE 5.0 08/31/2019 1322   GLUCOSEU NEGATIVE 08/31/2019 1322   HGBUR SMALL (A) 08/31/2019 1322   BILIRUBINUR NEGATIVE 08/31/2019 1322   KETONESUR NEGATIVE 08/31/2019 1322   PROTEINUR 100 (A) 08/31/2019 1322   UROBILINOGEN 0.2 01/27/2011 1233   NITRITE NEGATIVE 08/31/2019 1322   LEUKOCYTESUR NEGATIVE 08/31/2019 1322   Sepsis Labs Invalid input(s): PROCALCITONIN,  WBC,  LACTICIDVEN Microbiology Recent Results (from the past 240 hour(s))  Resp Panel by RT-PCR (Flu A&B, Covid) Nasopharyngeal Swab     Status: None   Collection Time: 02/24/20 12:53 PM   Specimen: Nasopharyngeal Swab; Nasopharyngeal(NP) swabs in vial transport medium  Result Value Ref Range Status   SARS Coronavirus 2 by RT PCR NEGATIVE NEGATIVE Final  Comment: (NOTE) SARS-CoV-2 target nucleic acids are NOT DETECTED.  The SARS-CoV-2 RNA is generally detectable in upper respiratory specimens during the acute phase of infection. The lowest concentration of SARS-CoV-2 viral copies this assay can detect is 138 copies/mL. A negative result does not preclude SARS-Cov-2 infection and should not be used as the sole basis for treatment or other patient management decisions. A negative result may occur with  improper specimen collection/handling, submission of specimen other than nasopharyngeal swab, presence of viral mutation(s) within the areas targeted by this assay, and inadequate number of viral copies(<138 copies/mL). A negative result must be combined with clinical observations, patient history, and epidemiological information.  The expected result is Negative.  Fact Sheet for Patients:  EntrepreneurPulse.com.au  Fact Sheet for Healthcare Providers:  IncredibleEmployment.be  This test is no t yet approved or cleared by the [place] FDA and  has been authorized for detection and/or diagnosis of SARS-CoV-2 by FDA under an Emergency Use Authorization (EUA). This EUA will remain  in effect (meaning this test can be used) for the duration of the COVID-19 declaration under Section 564(b)(1) of the Act, 21 U.S.C.section 360bbb-3(b)(1), unless the authorization is terminated  or revoked sooner.       Influenza A by PCR NEGATIVE NEGATIVE Final   Influenza B by PCR NEGATIVE NEGATIVE Final    Comment: (NOTE) The Xpert Xpress SARS-CoV-2/FLU/RSV plus assay is intended as an aid in the diagnosis of influenza from Nasopharyngeal swab specimens and should not be used as a sole basis for treatment. Nasal washings and aspirates are unacceptable for Xpert Xpress SARS-CoV-2/FLU/RSV testing.  Fact Sheet for Patients: EntrepreneurPulse.com.au  Fact Sheet for Healthcare Providers: IncredibleEmployment.be  This test is not yet approved or cleared by the [place] FDA and has been authorized for detection and/or diagnosis of SARS-CoV-2 by FDA under an Emergency Use Authorization (EUA). This EUA will remain in effect (meaning this test can be used) for the duration of the COVID-19 declaration under Section 564(b)(1) of the Act, 21 U.S.C. section 360bbb-3(b)(1), unless the authorization is terminated or revoked.  Performed at [facility], [place] [address], [place], [place] [address]      Time coordinating discharge: 35 minutes  SIGNED:   Aline August, MD  [facility] 03/02/2020, 10:29 AM

## 2020-03-27 DIAGNOSIS — I639 Cerebral infarction, unspecified: Secondary | ICD-10-CM | POA: Diagnosis not present

## 2020-03-27 DIAGNOSIS — I509 Heart failure, unspecified: Secondary | ICD-10-CM | POA: Diagnosis not present

## 2020-03-27 DIAGNOSIS — E785 Hyperlipidemia, unspecified: Secondary | ICD-10-CM | POA: Diagnosis not present

## 2020-03-27 DIAGNOSIS — E7800 Pure hypercholesterolemia, unspecified: Secondary | ICD-10-CM | POA: Diagnosis not present

## 2020-03-27 DIAGNOSIS — M1990 Unspecified osteoarthritis, unspecified site: Secondary | ICD-10-CM | POA: Diagnosis not present

## 2020-03-27 DIAGNOSIS — Z8546 Personal history of malignant neoplasm of prostate: Secondary | ICD-10-CM | POA: Diagnosis not present

## 2020-03-27 DIAGNOSIS — I480 Paroxysmal atrial fibrillation: Secondary | ICD-10-CM | POA: Diagnosis not present

## 2020-03-27 DIAGNOSIS — I214 Non-ST elevation (NSTEMI) myocardial infarction: Secondary | ICD-10-CM | POA: Diagnosis not present

## 2020-03-27 DIAGNOSIS — I10 Essential (primary) hypertension: Secondary | ICD-10-CM | POA: Diagnosis not present

## 2020-04-22 ENCOUNTER — Encounter

## 2020-05-10 ENCOUNTER — Other Ambulatory Visit (HOSPITAL_COMMUNITY): Payer: Self-pay

## 2020-05-27 ENCOUNTER — Encounter

## 2020-05-27 DIAGNOSIS — I480 Paroxysmal atrial fibrillation: Secondary | ICD-10-CM | POA: Diagnosis not present

## 2020-05-27 DIAGNOSIS — I214 Non-ST elevation (NSTEMI) myocardial infarction: Secondary | ICD-10-CM | POA: Diagnosis not present

## 2020-05-27 DIAGNOSIS — M1990 Unspecified osteoarthritis, unspecified site: Secondary | ICD-10-CM | POA: Diagnosis not present

## 2020-05-27 DIAGNOSIS — E7800 Pure hypercholesterolemia, unspecified: Secondary | ICD-10-CM | POA: Diagnosis not present

## 2020-05-27 DIAGNOSIS — E785 Hyperlipidemia, unspecified: Secondary | ICD-10-CM | POA: Diagnosis not present

## 2020-05-27 DIAGNOSIS — I509 Heart failure, unspecified: Secondary | ICD-10-CM | POA: Diagnosis not present

## 2020-05-27 DIAGNOSIS — Z8546 Personal history of malignant neoplasm of prostate: Secondary | ICD-10-CM | POA: Diagnosis not present

## 2020-05-27 DIAGNOSIS — I10 Essential (primary) hypertension: Secondary | ICD-10-CM | POA: Diagnosis not present

## 2020-05-27 DIAGNOSIS — I639 Cerebral infarction, unspecified: Secondary | ICD-10-CM | POA: Diagnosis not present

## 2020-06-03 DIAGNOSIS — D649 Anemia, unspecified: Secondary | ICD-10-CM | POA: Diagnosis not present

## 2020-06-03 DIAGNOSIS — R079 Chest pain, unspecified: Secondary | ICD-10-CM | POA: Diagnosis not present

## 2020-06-03 DIAGNOSIS — R0789 Other chest pain: Secondary | ICD-10-CM | POA: Diagnosis not present

## 2020-06-03 DIAGNOSIS — E1165 Type 2 diabetes mellitus with hyperglycemia: Secondary | ICD-10-CM | POA: Diagnosis not present

## 2020-06-03 DIAGNOSIS — R072 Precordial pain: Secondary | ICD-10-CM | POA: Diagnosis not present

## 2020-06-03 DIAGNOSIS — I482 Chronic atrial fibrillation, unspecified: Secondary | ICD-10-CM | POA: Diagnosis not present

## 2020-06-03 DIAGNOSIS — I130 Hypertensive heart and chronic kidney disease with heart failure and stage 1 through stage 4 chronic kidney disease, or unspecified chronic kidney disease: Secondary | ICD-10-CM | POA: Diagnosis not present

## 2020-06-03 DIAGNOSIS — E876 Hypokalemia: Secondary | ICD-10-CM | POA: Diagnosis not present

## 2020-06-03 DIAGNOSIS — R739 Hyperglycemia, unspecified: Secondary | ICD-10-CM | POA: Diagnosis not present

## 2020-06-03 DIAGNOSIS — I509 Heart failure, unspecified: Secondary | ICD-10-CM | POA: Diagnosis not present

## 2020-06-03 DIAGNOSIS — F0390 Unspecified dementia without behavioral disturbance: Secondary | ICD-10-CM | POA: Diagnosis not present

## 2020-06-03 DIAGNOSIS — N189 Chronic kidney disease, unspecified: Secondary | ICD-10-CM | POA: Diagnosis not present

## 2020-06-04 DIAGNOSIS — R739 Hyperglycemia, unspecified: Secondary | ICD-10-CM | POA: Diagnosis not present

## 2020-06-04 DIAGNOSIS — D649 Anemia, unspecified: Secondary | ICD-10-CM | POA: Diagnosis not present

## 2020-06-04 DIAGNOSIS — R4182 Altered mental status, unspecified: Secondary | ICD-10-CM | POA: Diagnosis not present

## 2020-06-04 DIAGNOSIS — I509 Heart failure, unspecified: Secondary | ICD-10-CM | POA: Diagnosis not present

## 2020-06-04 DIAGNOSIS — R079 Chest pain, unspecified: Secondary | ICD-10-CM | POA: Diagnosis not present

## 2020-06-04 DIAGNOSIS — N189 Chronic kidney disease, unspecified: Secondary | ICD-10-CM | POA: Diagnosis not present

## 2020-06-04 DIAGNOSIS — I4891 Unspecified atrial fibrillation: Secondary | ICD-10-CM | POA: Diagnosis not present

## 2020-06-04 DIAGNOSIS — E876 Hypokalemia: Secondary | ICD-10-CM | POA: Diagnosis not present

## 2020-06-05 DIAGNOSIS — N189 Chronic kidney disease, unspecified: Secondary | ICD-10-CM | POA: Diagnosis not present

## 2020-06-05 DIAGNOSIS — M7989 Other specified soft tissue disorders: Secondary | ICD-10-CM | POA: Diagnosis not present

## 2020-06-05 DIAGNOSIS — R739 Hyperglycemia, unspecified: Secondary | ICD-10-CM | POA: Diagnosis not present

## 2020-06-05 DIAGNOSIS — I509 Heart failure, unspecified: Secondary | ICD-10-CM | POA: Diagnosis not present

## 2020-06-05 DIAGNOSIS — D649 Anemia, unspecified: Secondary | ICD-10-CM | POA: Diagnosis not present

## 2020-06-05 DIAGNOSIS — E876 Hypokalemia: Secondary | ICD-10-CM | POA: Diagnosis not present

## 2020-06-05 DIAGNOSIS — R079 Chest pain, unspecified: Secondary | ICD-10-CM | POA: Diagnosis not present

## 2020-07-29 ENCOUNTER — Other Ambulatory Visit: Payer: Self-pay | Admitting: Interventional Cardiology

## 2021-04-10 IMAGING — CT CT HEAD W/O CM
3 series · 15 of 47 positions shown, 18 images · non-contrast
Comparison: CT head 02/13/2018

CLINICAL DATA: Mental status change, confusion

EXAM:
CT HEAD WITHOUT CONTRAST
TECHNIQUE: Contiguous axial images were obtained from the base of the skull
through the vertex without intravenous contrast.

[Series 2: head wo · axial · 0.43mm/px · z∈[+1510,+1640]mm · 9 of 32 slices shown, 12 images]
[im 3/32  brain]
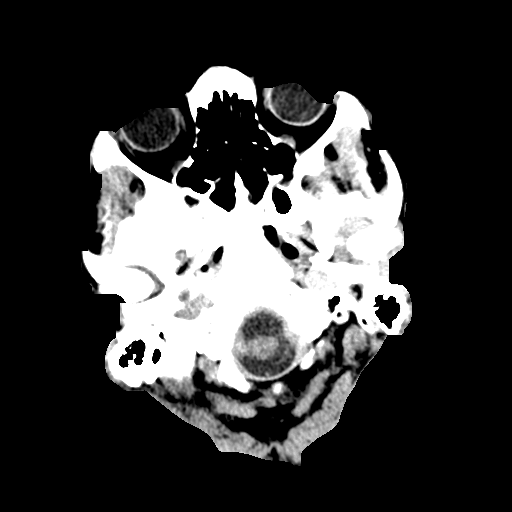
[im 3/32  bone]
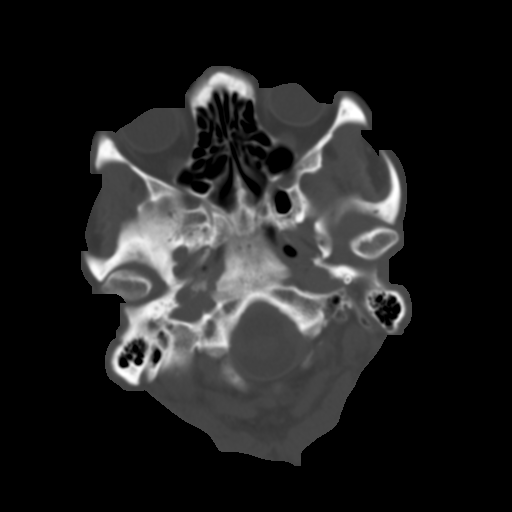
[im 6/32  brain]
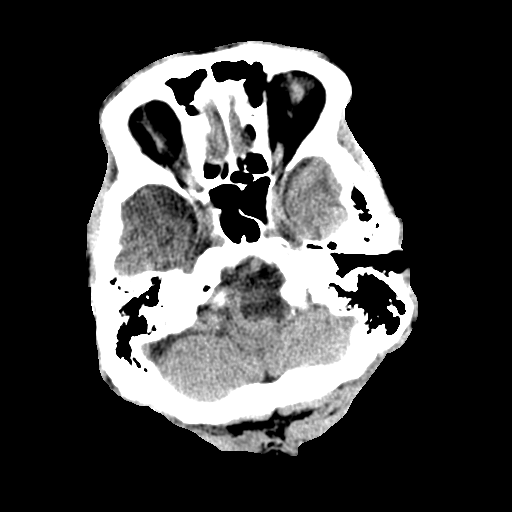
[im 9/32  brain]
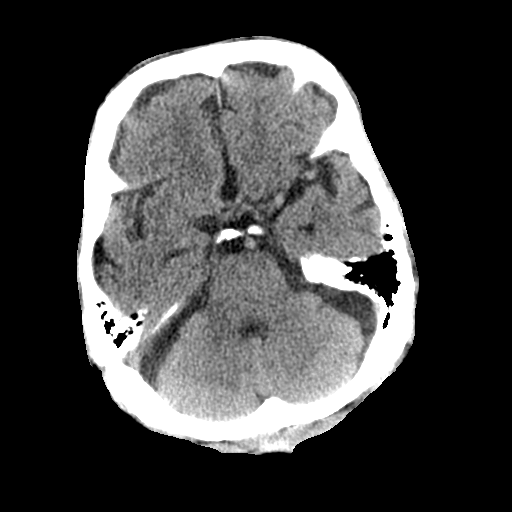
[im 12/32  brain]
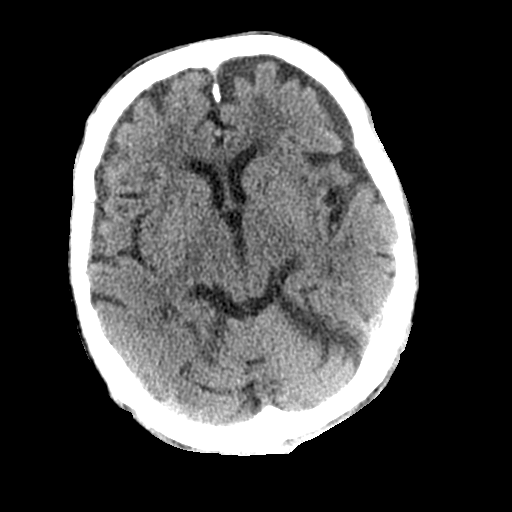
[im 17/32  brain]
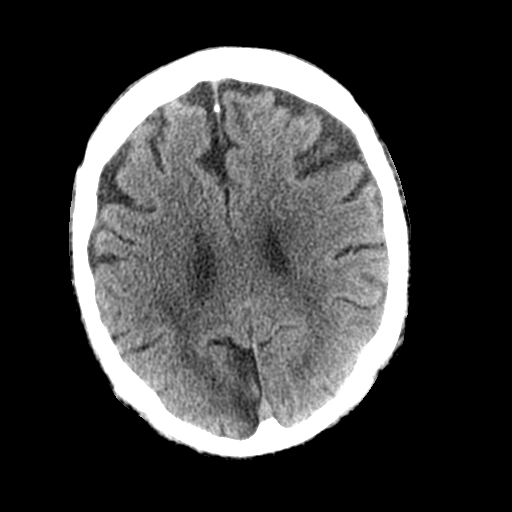
[im 17/32  bone]
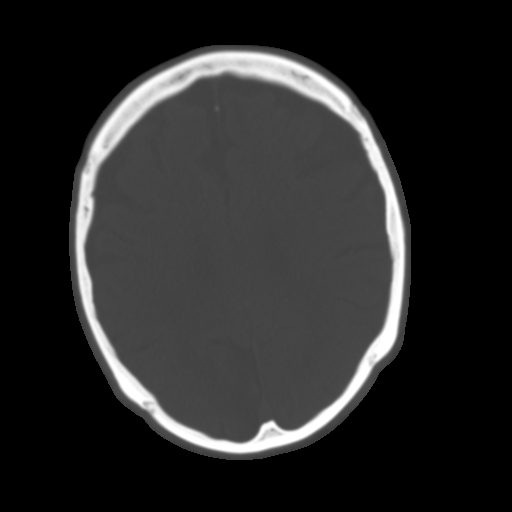
[im 20/32  brain]
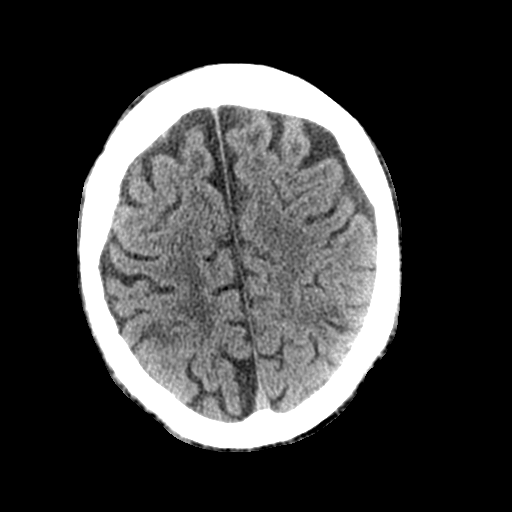
[im 23/32  brain]
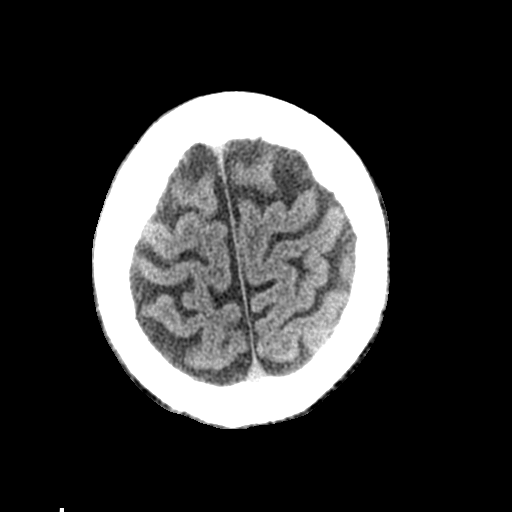
[im 26/32  brain]
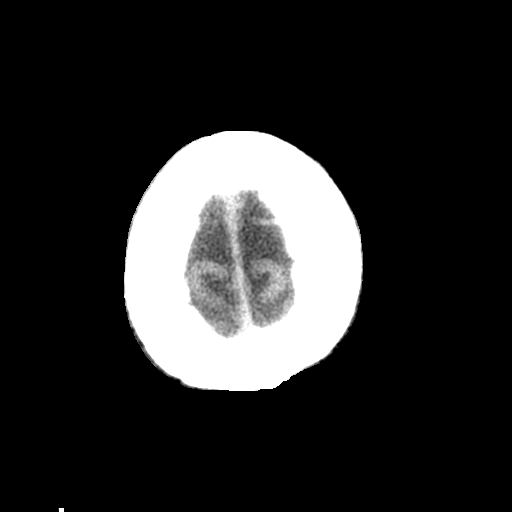
[im 29/32  brain]
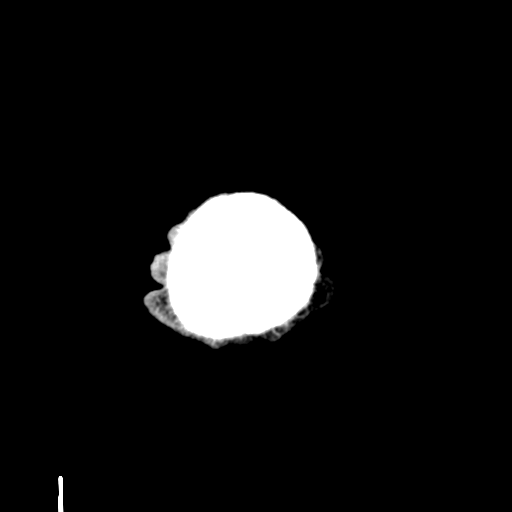
[im 29/32  bone]
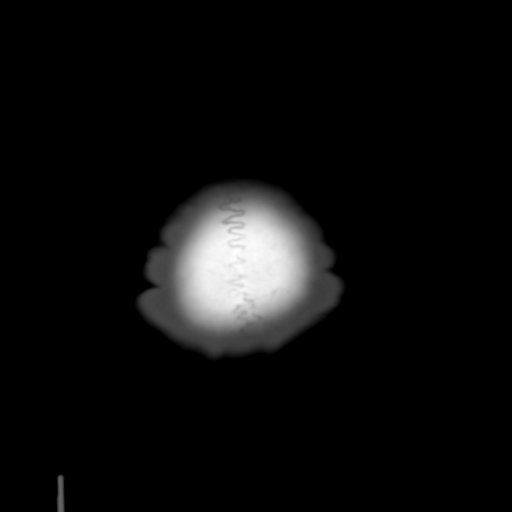

[Series 4: coronal soft tissue · coronal · 0.30mm/px · 3 of 67 slices shown]
[im 23/67  brain]
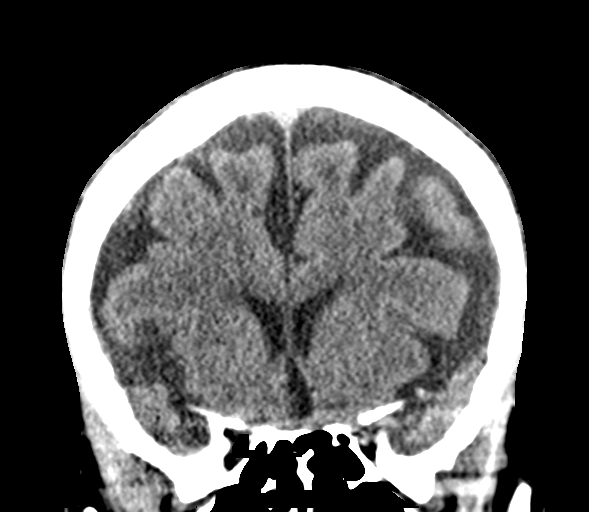
[im 30/67  brain]
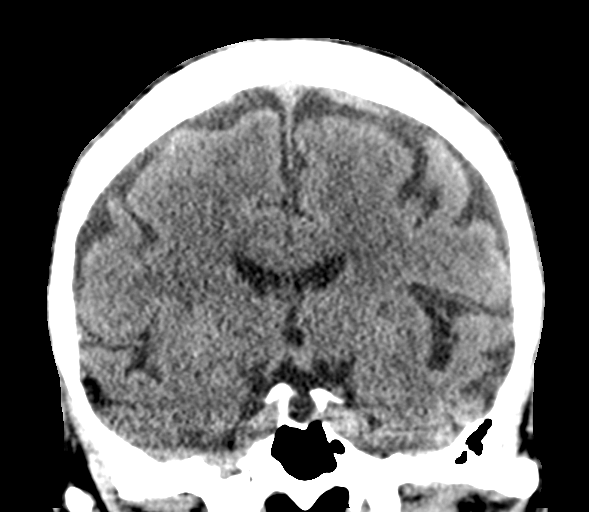
[im 37/67  brain]
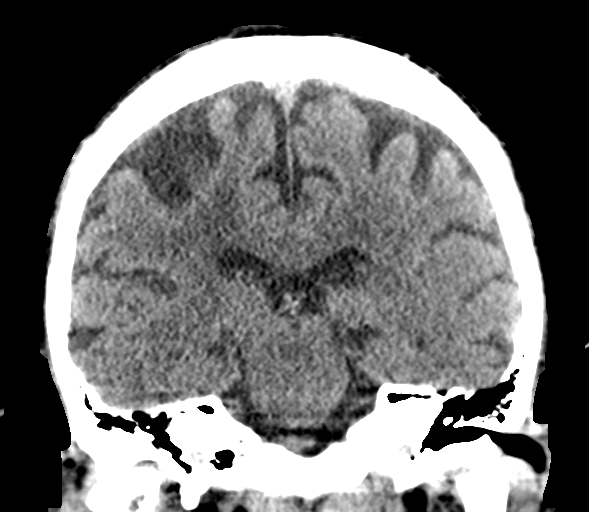

[Series 5: sagittal soft tissue · sagittal · 0.28mm/px · 3 of 59 slices shown]
[im 20/59  brain]
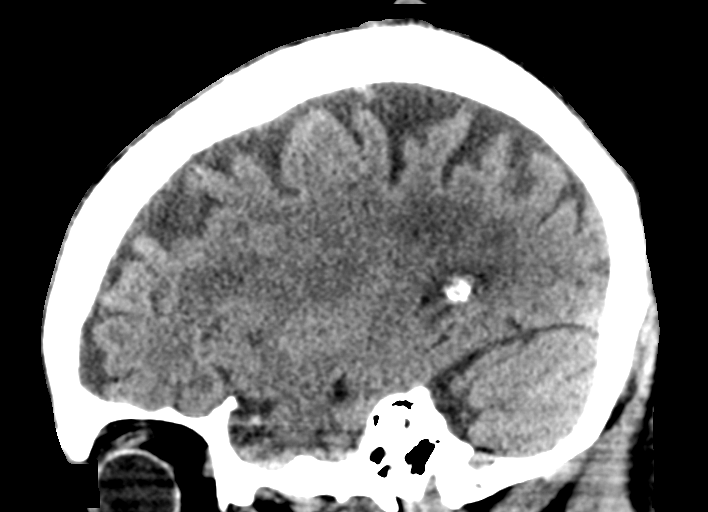
[im 30/59  brain]
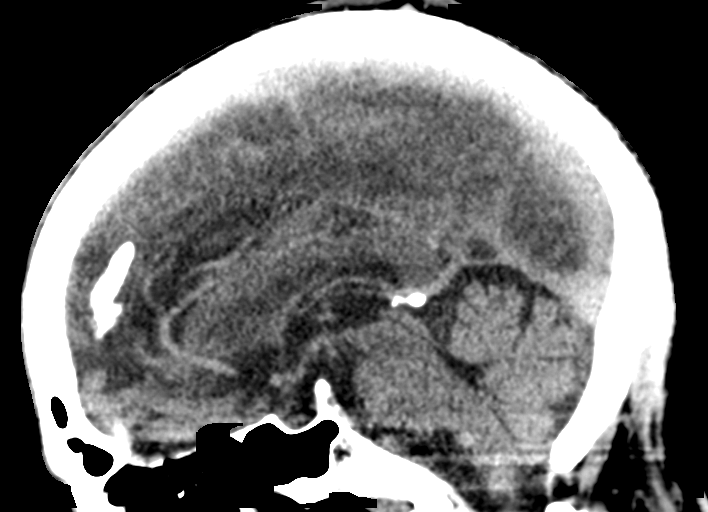
[im 39/59  brain]
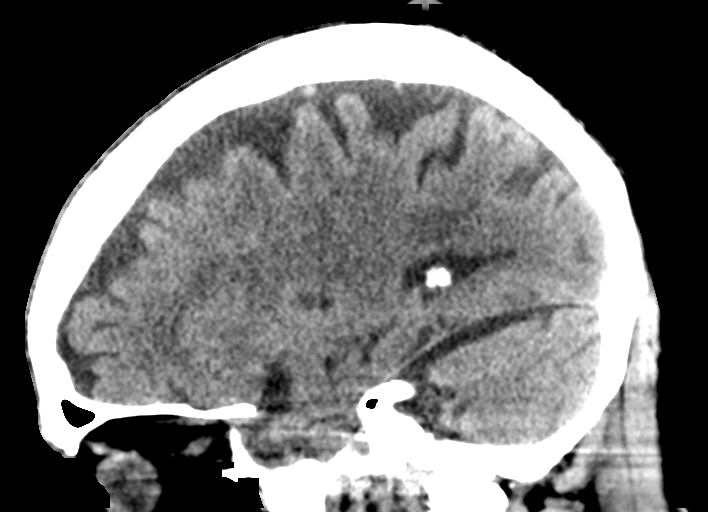

[15 of 47 positions shown; findings below may reference images not displayed]

FINDINGS: Brain: 10 mm hyperdensity right thalamus is new since the prior
study most consistent with acute hemorrhage. No significant
mass-effect or surrounding edema.

Ventricle size normal. Patchy white matter hypodensity bilaterally
is chronic and unchanged. Chronic infarct right occipital lobe was
not present previously.

Negative for acute infarct.

Vascular: Negative for hyperdense vessel

Skull: Negative

Sinuses/Orbits: Negative

Other: None
IMPRESSION: 10 mm hyperdensity right thalamus compatible with acute hemorrhage.

Atrophy and chronic ischemic change. Chronic infarct right occipital
lobe.

These results were called by telephone at the time of interpretation
on 08/07/2019 at [DATE] to provider EMMANUELA CORDER , who verbally
acknowledged these results.

## 2021-11-05 DEATH — deceased
